# Patient Record
Sex: Female | Born: 1985 | Hispanic: No | Marital: Married | Smoking: Never smoker
Health system: Southern US, Community
[De-identification: ages and names within clinical notes are randomized; demographics above are authoritative.]

## PROBLEM LIST (undated history)

## (undated) DIAGNOSIS — C719 Malignant neoplasm of brain, unspecified: Secondary | ICD-10-CM

## (undated) DIAGNOSIS — D649 Anemia, unspecified: Secondary | ICD-10-CM

## (undated) DIAGNOSIS — C801 Malignant (primary) neoplasm, unspecified: Secondary | ICD-10-CM

## (undated) DIAGNOSIS — F419 Anxiety disorder, unspecified: Secondary | ICD-10-CM

## (undated) DIAGNOSIS — Z923 Personal history of irradiation: Secondary | ICD-10-CM

## (undated) DIAGNOSIS — Z86718 Personal history of other venous thrombosis and embolism: Secondary | ICD-10-CM

## (undated) HISTORY — PX: PORTACATH PLACEMENT: SHX2246

## (undated) HISTORY — PX: TOTAL MASTECTOMY: SHX6129

## (undated) HISTORY — PX: TONSILLECTOMY: SUR1361

## (undated) HISTORY — DX: Anxiety disorder, unspecified: F41.9

## (undated) HISTORY — PX: BREAST RECONSTRUCTION WITH PLACEMENT OF TISSUE EXPANDER AND FLEX HD (ACELLULAR HYDRATED DERMIS): SHX6295

## (undated) HISTORY — PX: WISDOM TOOTH EXTRACTION: SHX21

## (undated) HISTORY — PX: BREAST SURGERY: SHX581

## (undated) HISTORY — PX: OTHER SURGICAL HISTORY: SHX169

## (undated) HISTORY — PX: TONSILLECTOMY AND ADENOIDECTOMY: SHX28

## (undated) HISTORY — PX: REMOVAL OF BILATERAL TISSUE EXPANDERS WITH PLACEMENT OF BILATERAL BREAST IMPLANTS: SHX6431

## (undated) HISTORY — PX: RADIOACTIVE SEED GUIDED AXILLARY SENTINEL LYMPH NODE: SHX6735

## (undated) HISTORY — PX: SENTINEL NODE BIOPSY: SHX6608

## (undated) MED FILL — Fosaprepitant Dimeglumine For IV Infusion 150 MG (Base Eq): INTRAVENOUS | Qty: 5 | Status: AC

---

## 2005-04-04 ENCOUNTER — Other Ambulatory Visit: Admission: RE | Admit: 2005-04-04 | Discharge: 2005-04-04 | Payer: Self-pay | Admitting: Obstetrics and Gynecology

## 2005-04-09 ENCOUNTER — Encounter: Admission: RE | Admit: 2005-04-09 | Discharge: 2005-04-09 | Payer: Self-pay | Admitting: Obstetrics and Gynecology

## 2005-09-19 ENCOUNTER — Ambulatory Visit (HOSPITAL_BASED_OUTPATIENT_CLINIC_OR_DEPARTMENT_OTHER): Admission: RE | Admit: 2005-09-19 | Discharge: 2005-09-19 | Payer: Self-pay | Admitting: *Deleted

## 2005-09-19 ENCOUNTER — Ambulatory Visit (HOSPITAL_COMMUNITY): Admission: RE | Admit: 2005-09-19 | Discharge: 2005-09-19 | Payer: Self-pay | Admitting: *Deleted

## 2005-09-19 ENCOUNTER — Encounter (INDEPENDENT_AMBULATORY_CARE_PROVIDER_SITE_OTHER): Payer: Self-pay | Admitting: *Deleted

## 2007-09-26 ENCOUNTER — Ambulatory Visit (HOSPITAL_BASED_OUTPATIENT_CLINIC_OR_DEPARTMENT_OTHER): Admission: RE | Admit: 2007-09-26 | Discharge: 2007-09-26 | Payer: Self-pay | Admitting: Otolaryngology

## 2007-09-26 ENCOUNTER — Encounter (INDEPENDENT_AMBULATORY_CARE_PROVIDER_SITE_OTHER): Payer: Self-pay | Admitting: Otolaryngology

## 2011-02-23 NOTE — Op Note (Signed)
Jenna Cross, BARRANTES            ACCOUNT NO.:  [id number]   MEDICAL RECORD NO.:  [id number]          PATIENT TYPE:  AMB   LOCATION:  DSC                          FACILITY:  [facility]   PHYSICIAN:  Alfonse Ras, MD   DATE OF BIRTH:  11-30-1985   DATE OF PROCEDURE:  09/19/2005  DATE OF DISCHARGE:  09/19/2005                                 OPERATIVE REPORT   PREOPERATIVE DIAGNOSIS:  Right breast mass x2.   POSTOPERATIVE DIAGNOSIS:  Right breast mass x2.   OPERATION PERFORMED:  Excisional right breast biopsy x2.   SURGEON:  Danna Hefty, MD   ANESTHESIA:  General.   DESCRIPTION OF PROCEDURE:  The patient was taken to the operating room and  placed in supine position.  After adequate general anesthesia was induced,  the right breast was prepped and draped in the normal sterile fashion.  A  curvilinear incision was made  over the palpable mass in the 7 o'clock  region of the right breast.  A 2-0 Vicryl suture was placed within the  palpable mass and the mass was delivered up and through the incision,  excised in its entirety.  Adequate hemostasis was ensured and the skin was  closed with subcuticular 4-0 Monocryl.  Identical procedure was made in the  3 o'clock periareolar region through a small incision and using a grasping  suture as well.  It was closed in identical fashion.  Steri-Strips and  sterile dressings were applied.  The patient tolerated the procedure well  and went to PACU in good condition.      Alfonse Ras, MD  Electronically Signed     KRE/MEDQ  D:  10/11/2005  T:  10/11/2005  Job:  [id number]

## 2011-02-23 NOTE — Op Note (Signed)
NAMEBRIGITTE, Jenna Cross            ACCOUNT NO.:  [id number]   MEDICAL RECORD NO.:  [id number]          PATIENT TYPE:  AMB   LOCATION:  DSC                          FACILITY:  [facility]   PHYSICIAN:  Vikki Ports, MDDATE OF BIRTH:  1986-06-13   DATE OF PROCEDURE:  09/19/2005  DATE OF DISCHARGE:                                 OPERATIVE REPORT   PREOPERATIVE DIAGNOSIS:  Right breast mass x2.   POSTOPERATIVE DIAGNOSIS:  Right breast mass x2.   PROCEDURE:  Right breast biopsy x2.   SURGEON:  Vikki Ports, M.D.   ANESTHESIA:  General.   DESCRIPTION:  The patient was taken to the operating room and placed in  supine position.  After adequate general anesthesia was induced using  laryngeal mask, the right breast was prepped and draped in a normal sterile  fashion.  A curvilinear incision was made in the 7 o'clock periareolar  region of the right breast.  I dissected down through skin and subcutaneous  tissue onto a palpable mass in the right breast.  The mass was pierced with  a 3-0 Vicryl suture and the mass was delivered up and through the wound and  excised in its entirety.  The skin was closed with subcuticular 4-0 Monocryl  after adequate hemostasis was ensured.   I then turned my attention to the 3 o'clock periareolar region of the right  breast, where a palpable lesion was palpated.  Again a small incision was  made.  The mass was pierced with a 3-0 Vicryl suture, delivered up and  through the wound in a similar fashion, excised in its entirety, adequate  hemostasis was ensured, and the skin was closed with a subcuticular 4-0  Monocryl.  Dermabond dressings were placed.  The patient tolerated the  procedure well and went to PACU in good condition.      Vikki Ports, MD  Electronically Signed     KRH/MEDQ  D:  09/19/2005  T:  09/20/2005  Job:  [id number]

## 2011-02-23 NOTE — Op Note (Signed)
NAMEJACCI, RUBERG            ACCOUNT NO.:  [id number]   MEDICAL RECORD NO.:  [id number]          PATIENT TYPE:  AMB   LOCATION:  DSC                          FACILITY:  [facility]   PHYSICIAN:  Lucky Cowboy, MD         DATE OF BIRTH:  1986-02-07   DATE OF PROCEDURE:  10/23/2007  DATE OF DISCHARGE:  09/26/2007                               OPERATIVE REPORT   PREOPERATIVE DIAGNOSIS:  Chronic tonsillitis.   POSTOPERATIVE DIAGNOSIS:  Chronic tonsillitis.   PROCEDURE:  Tonsillectomy.   SURGEON:  Dr. Lucky Cowboy.   ANESTHESIA:  General.   ESTIMATED BLOOD LOSS:  Less than 20 mL.   SPECIMENS:  Tonsils.   COMPLICATIONS:  None.   INDICATIONS:  The patient is a 25 year old female who has had many  problems with tonsillitis.  These have been treated with antibiotic  therapy.  She continues to have frequent sore throats.  For these  reasons, the tonsils are removed.   PROCEDURE:  The patient was taken to the operating room and placed on  the table in the supine position.  She was then placed under general  endotracheal anesthesia and the table rotated counterclockwise 90  degrees.  The head and body were draped.  A Crowe-Davis mouth gag with a  #3 tongue blade was then placed intraorally, opened and suspended on the  Mayo stand.  Palpation of the soft palate was without evidence of  submucosal cleft.  The right palatine tonsil was grasped with Allis  clamps directed inferior medially.  Bovie cautery was then used to  excise the tonsil staying within the peritonsillar space adjacent to the  tonsillar capsule.  The left palatine tonsil was removed in identical  fashion.  The oral cavity was irrigated and then suctioned free of  saline.  An NG tube was placed down the esophagus for suctioning of the  gastric contents.  The mouth gag was removed noting no damage to the  teeth or soft tissues.  Table was rotated clockwise 90 degrees to its  original position.  The patient was awakened from  anesthesia and taken  to the Post Anesthesia Care in stable condition.  No complications.      Lucky Cowboy, MD  Electronically Signed     SJ/MEDQ  D:  10/23/2007  T:  10/24/2007  Job:  [id number]

## 2011-02-23 NOTE — Op Note (Signed)
NAMEYULIA, Jenna Cross            ACCOUNT NO.:  [id number]   MEDICAL RECORD NO.:  [id number]          PATIENT TYPE:  AMB   LOCATION:  DSC                          FACILITY:  [facility]   PHYSICIAN:  Vikki Ports, MDDATE OF BIRTH:  12-26-1985   DATE OF PROCEDURE:  09/19/2005  DATE OF DISCHARGE:                                 OPERATIVE REPORT   PREOPERATIVE DIAGNOSIS:  Right breast mass x2.   POSTOPERATIVE DIAGNOSIS:  Right breast mass x2.   PROCEDURE:  Right breast biopsy x2.   SURGEON:  Vikki Ports, M.D.   ANESTHESIA:  General anesthesia.   DESCRIPTION OF PROCEDURE:  Patient was taken to the operating room and  placed in supine position.  After adequate general anesthesia was induced,  the right breast was prepped and draped in the normal sterile fashion.  Skin  overlying the 9 o'clock region of the peri-areolar right breast was incised,  dissected down through skin and subcutaneous tissue and to a palpable right  breast mass.  This was speared with a 3-0 Vicryl suture.  The mass was  delivered up and through the wound.  It was excised in its entirety.  Adequate hemostasis was assured and the skin was closed with subcuticular 4-  0 Monocryl.   I then turned my attention to the ...   Dictation ended at this point.      Vikki Ports, MD  Electronically Signed     KRH/MEDQ  D:  09/19/2005  T:  09/20/2005  Job:  [id number]

## 2012-10-16 ENCOUNTER — Other Ambulatory Visit: Payer: Self-pay | Admitting: Obstetrics and Gynecology

## 2013-03-23 ENCOUNTER — Other Ambulatory Visit: Payer: Self-pay | Admitting: *Deleted

## 2013-03-23 DIAGNOSIS — R600 Localized edema: Secondary | ICD-10-CM

## 2013-03-24 ENCOUNTER — Encounter (INDEPENDENT_AMBULATORY_CARE_PROVIDER_SITE_OTHER): Payer: 59 | Admitting: *Deleted

## 2013-03-24 DIAGNOSIS — R609 Edema, unspecified: Secondary | ICD-10-CM

## 2014-02-16 ENCOUNTER — Other Ambulatory Visit: Payer: Self-pay | Admitting: Obstetrics and Gynecology

## 2015-06-23 ENCOUNTER — Other Ambulatory Visit: Payer: Self-pay | Admitting: Obstetrics and Gynecology

## 2015-06-24 LAB — CYTOLOGY - PAP

## 2016-03-27 ENCOUNTER — Telehealth: Payer: Self-pay | Admitting: Neurology

## 2016-03-27 NOTE — Telephone Encounter (Signed)
Returned pt TC. No answer, left VM mssg w/ new pt appt availability for next Thursday 6/29. May call back to schedule NP appt for persistent leg numbness/weakness.

## 2016-03-27 NOTE — Telephone Encounter (Signed)
Pt returned Jennifer's call. Anderson Malta was skyped regarding setting up NP appt to get pt in sooner. She said there was an opening for Dr Jannifer Franklin on Friday 6./23, pt will be out of town that day and could not take the appt . sts she will be flexible next week. Please call

## 2016-04-05 ENCOUNTER — Encounter: Payer: Self-pay | Admitting: Neurology

## 2016-04-05 ENCOUNTER — Ambulatory Visit (INDEPENDENT_AMBULATORY_CARE_PROVIDER_SITE_OTHER): Payer: BLUE CROSS/BLUE SHIELD | Admitting: Neurology

## 2016-04-05 VITALS — BP 102/66 | HR 72 | Ht 63.0 in | Wt 119.5 lb

## 2016-04-05 DIAGNOSIS — R202 Paresthesia of skin: Secondary | ICD-10-CM | POA: Insufficient documentation

## 2016-04-05 NOTE — Progress Notes (Signed)
Reason for visit: Leg numbness  Referring physician: Dr. Bettye Boeck is a 30 y.o. female  History of present illness:  Ms. Jenna Cross is a 30 year old right-handed white female with a history of onset of right anterolateral leg numbness that began around Memorial Day of 2017. The area of numbness is mainly in a 10 cm area just below the knee, but may spread down lower on the leg at times. The patient has felt somewhat weak in the leg with some stiffness sensation as well. This has not significantly altered her ability to ambulate, she has not had any gait instability or falls. Within several weeks after the onset of the leg symptoms, she also began having some sensation of heaviness or weakness of the right arm unassociated with numbness. She denies any pain in the neck, she does have a history of some low back pain without radiation down the leg. The patient denies any headaches, numbness on the head or neck, she denies vision changes, speech changes or swallowing problems. She denies any issues controlling the bowels or the bladder. She has had some improvement of the symptoms as time has gone on, there has been some mild fatigue. She is sent to this office for further evaluation.  Past Medical History  Diagnosis Date  . Anxiety     History reviewed. No pertinent past surgical history.  Family History  Problem Relation Age of Onset  . Migraines Mother     Social history:  reports that she has never smoked. She does not have any smokeless tobacco history on file. She reports that she drinks about 1.2 oz of alcohol per week. She reports that she does not use illicit drugs.  Medications:  Prior to Admission medications   Medication Sig Start Date End Date Taking? Authorizing Provider  [place] 3-0.02 MG tablet  03/22/16  Yes Historical Provider, MD  spironolactone (ALDACTONE) 50 MG tablet Take 50 mg by mouth daily. 03/07/16  Yes Historical Provider, MD  ZENPEP 40000 units CPEP  Reported on 04/05/2016 03/29/16   Historical Provider, MD     Not on File  ROS:  Out of a complete 14 system review of symptoms, the patient complains only of the following symptoms, and all other reviewed systems are negative.  Anxiety  Blood pressure 102/66, pulse 72, height 5\' 3"  (1.6 m), weight 119 lb 8 oz (54.205 kg).  Physical Exam  General: The patient is alert and cooperative at the time of the examination.  Eyes: Pupils are equal, round, and reactive to light. Discs are flat bilaterally.  Neck: The neck is supple, no carotid bruits are noted.  Respiratory: The respiratory examination is clear.  Cardiovascular: The cardiovascular examination reveals a regular rate and rhythm, no obvious murmurs or rubs are noted.  Neuromuscular: Range of movement of the low back is full. The patient is able to walk on heels and the toes.  Skin: Extremities are without significant edema.  Neurologic Exam  Mental status: The patient is alert and oriented x 3 at the time of the examination. The patient has apparent normal recent and remote memory, with an apparently normal attention span and concentration ability.  Cranial nerves: Facial symmetry is present. There is good sensation of the face to pinprick and soft touch bilaterally. The strength of the facial muscles and the muscles to head turning and shoulder shrug are normal bilaterally. Speech is well enunciated, no aphasia or dysarthria is noted. Extraocular movements are full. Visual fields are  full. The tongue is midline, and the patient has symmetric elevation of the soft palate. No obvious hearing deficits are noted.  Motor: The motor testing reveals 5 over 5 strength of all 4 extremities. Good symmetric motor tone is noted throughout.  Sensory: Sensory testing is intact to pinprick, soft touch, vibration sensation, and position sense on all 4 extremities. No evidence of extinction is noted.  Coordination: Cerebellar testing reveals  good finger-nose-finger and heel-to-shin bilaterally.  Gait and station: Gait is normal. Tandem gait is normal. Romberg is negative. No drift is seen.  Reflexes: Deep tendon reflexes are symmetric and normal bilaterally. Toes are downgoing bilaterally.   Assessment/Plan:  1. Right leg numbness, right arm heaviness  The patient reports symptoms on the right arm and right leg that are mainly subjective, clinical examination today is normal. Given the age of the patient, we will need to evaluate her for demyelinating disease. MRI of the brain will be ordered. Some blood work will be done today. If this study is unremarkable, nerve conduction study of the right leg may be of some benefit. I will call the patient with the results of the MRI study.  Jill Alexanders MD 04/05/2016 7:52 PM  [facility] [address] [place], [place] [address]  Phone [phone] Fax [phone]

## 2016-04-06 LAB — COMPREHENSIVE METABOLIC PANEL
A/G RATIO: 1.6 (ref 1.2–2.2)
ALK PHOS: 68 IU/L (ref 39–117)
ALT: 10 IU/L (ref 0–32)
AST: 16 IU/L (ref 0–40)
Albumin: 4.4 g/dL (ref 3.5–5.5)
BILIRUBIN TOTAL: 0.5 mg/dL (ref 0.0–1.2)
BUN/Creatinine Ratio: 14 (ref 9–23)
BUN: 12 mg/dL (ref 6–20)
CHLORIDE: 100 mmol/L (ref 96–106)
CO2: 19 mmol/L (ref 18–29)
Calcium: 9.3 mg/dL (ref 8.7–10.2)
Creatinine, Ser: 0.83 mg/dL (ref 0.57–1.00)
GFR calc Af Amer: 109 mL/min/{1.73_m2} (ref >59)
GFR calc non Af Amer: 95 mL/min/{1.73_m2} (ref >59)
GLOBULIN, TOTAL: 2.8 g/dL (ref 1.5–4.5)
Glucose: 83 mg/dL (ref 65–99)
POTASSIUM: 4.5 mmol/L (ref 3.5–5.2)
SODIUM: 140 mmol/L (ref 134–144)
Total Protein: 7.2 g/dL (ref 6.0–8.5)

## 2016-04-06 LAB — B. BURGDORFI ANTIBODIES: Lyme IgG/IgM Ab: <0.91 {ISR} (ref 0.00–0.90)

## 2016-04-06 LAB — ANGIOTENSIN CONVERTING ENZYME: Angio Convert Enzyme: 39 U/L (ref 14–82)

## 2016-04-06 LAB — ANA W/REFLEX: Anti Nuclear Antibody(ANA): NEGATIVE

## 2016-04-06 LAB — HIV ANTIBODY (ROUTINE TESTING W REFLEX): HIV Screen 4th Generation wRfx: NONREACTIVE

## 2016-04-06 LAB — SEDIMENTATION RATE: SED RATE: 2 mm/h (ref 0–32)

## 2016-04-06 LAB — VITAMIN B12: VITAMIN B 12: 278 pg/mL (ref 211–946)

## 2016-04-09 ENCOUNTER — Telehealth: Payer: Self-pay

## 2016-04-09 NOTE — Telephone Encounter (Signed)
-----   Message from Kathrynn Ducking, MD sent at 04/08/2016  2:17 PM EDT -----  The blood work results are unremarkable. Please call the patient.  ----- Message -----    From: [facility] Lab Results In Interface    Sent: 04/06/2016   7:43 AM      To: Kathrynn Ducking, MD

## 2016-04-09 NOTE — Telephone Encounter (Signed)
Called pt w/ unremarkable lab results. May call back w/ further questions/concerns.

## 2016-04-26 ENCOUNTER — Ambulatory Visit
Admission: RE | Admit: 2016-04-26 | Discharge: 2016-04-26 | Disposition: A | Discharge status: Home / Self Care | Payer: BLUE CROSS/BLUE SHIELD | Source: Ambulatory Visit | Attending: Neurology | Admitting: Neurology

## 2016-04-26 DIAGNOSIS — R202 Paresthesia of skin: Secondary | ICD-10-CM

## 2016-04-26 MED ORDER — GADOBENATE DIMEGLUMINE 529 MG/ML IV SOLN
10.0000 mL | Freq: Once | INTRAVENOUS | Status: AC | PRN
  Administered 2016-04-26: 10 mL via INTRAVENOUS

## 2016-04-27 ENCOUNTER — Telehealth: Payer: Self-pay | Admitting: Neurology

## 2016-04-27 NOTE — Telephone Encounter (Signed)
I called the patient. The MRI of the brain is normal. If the sensory symptoms are getting worse, we will check EMG and NCV study. She is to call if this is the case.   MRI brain 04/27/16:  IMPRESSION: Unremarkable MRI scan of the brain with and without contrast

## 2018-08-29 ENCOUNTER — Encounter: Payer: Self-pay | Admitting: Plastic Surgery

## 2018-08-29 ENCOUNTER — Ambulatory Visit (INDEPENDENT_AMBULATORY_CARE_PROVIDER_SITE_OTHER): Payer: Self-pay | Admitting: Plastic Surgery

## 2018-08-29 DIAGNOSIS — Z719 Counseling, unspecified: Secondary | ICD-10-CM | POA: Insufficient documentation

## 2018-08-29 NOTE — Progress Notes (Signed)
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 I discussed with the patient this proposed procedure of botulinum toxin injections, which is customized depending on the particular needs of the patient. It is performed on facial rhytids as a temporary correction. The alternatives were discussed with the patient. The risks were addressed including bleeding, scarring, infection, damage to deeper structures, asymmetry, and chronic pain, which may occur infrequently after a procedure. The individual's choice to undergo a surgical procedure is based on the comparison of risks to potential benefits. Other risks include unsatisfactory results, brow ptosis, eyelid ptosis, allergic reaction, temporary paralysis, which should go away with time, bruising, blurring disturbances and delayed healing. Botulinum toxin injections do not arrest the aging process or produce permanent tightening of the eyelid.  Operative intervention maybe necessary to maintain the results of a blepharoplasty or botulinum toxin. The patient understands and wishes to proceed. An informed consent was signed and informational brochures given to her prior to the procedure.  Procedure: The area was prepped with alcohol and dried with a clean gauze. Using a clean technique, the botulinum toxin was diluted with 1.25 cc of preservative-free normal saline which was slowly injected with an 18 gauge needle in a tuberculin syringes.  A 32 gauge needles were then used to inject the botulinum toxin. This mixture allow for an aliquot of 5 units per 0.1 cc in each injection site.    Subsequently the mixture was injected in the glabellar and forehead area with preservation of the temporal  branch to the lateral eyebrow as well as into each lateral canthal area beginning from the lateral orbital rim medial to the zygomaticus major in 3 separate areas. A total of 20 Units of botulinum toxin was used. The forehead and glabellar area was injected with care to inject intramuscular only while holding pressure on the supratrochlear vessels in each area during each injection on either side of the medial corrugators. The injection proceeded vertically superiorly to the medial 2/3 of the frontalis muscle and superior 2/3 of the lateral frontalis, again with preservation of the frontal branch.  The midface area was injected at the 3 sub-regions of the mid-face: zygomaticomalar region, anteromedial cheek region, and submalar region for a total of one syringe on each side of the face. The technique used was serial puncture with equal injections in the 3 sub-regions: the zygomaticomalar region, the anteromedial cheek, and the submalar region.  No complications were noted. Light pressure was held for 5 minutes. She was instructed explicitly in post-operative care.  Botox LOT:  [id number] C2 EXP:  4/22  J. Voluma XC two LOT: [id number] EXP: 2019-10-27

## 2019-06-08 LAB — OB RESULTS CONSOLE ABO/RH
RH Type: POSITIVE
RH Type: POSITIVE

## 2019-06-08 LAB — OB RESULTS CONSOLE ANTIBODY SCREEN
Antibody Screen: NEGATIVE
Antibody Screen: NEGATIVE

## 2019-06-08 LAB — OB RESULTS CONSOLE GC/CHLAMYDIA
Chlamydia: NEGATIVE
Gonorrhea: NEGATIVE

## 2019-06-08 LAB — OB RESULTS CONSOLE RUBELLA ANTIBODY, IGM
Rubella: NON-IMMUNE/NOT IMMUNE
Rubella: NON-IMMUNE/NOT IMMUNE

## 2019-06-08 LAB — OB RESULTS CONSOLE RPR
RPR: NONREACTIVE
RPR: NONREACTIVE

## 2019-06-08 LAB — OB RESULTS CONSOLE HIV ANTIBODY (ROUTINE TESTING)
HIV: NONREACTIVE
HIV: NONREACTIVE

## 2019-06-08 LAB — OB RESULTS CONSOLE HEPATITIS B SURFACE ANTIGEN
Hepatitis B Surface Ag: NEGATIVE
Hepatitis B Surface Ag: NEGATIVE

## 2019-06-24 LAB — OB RESULTS CONSOLE GC/CHLAMYDIA
Chlamydia: NEGATIVE
Gonorrhea: NEGATIVE

## 2019-10-09 NOTE — L&D Delivery Note (Signed)
Operative Delivery Note At 3:09 AM a viable female was delivered via Vaginal, Spontaneous.  Presentation: vertex; Position: Right,, Occiput,, Posterior; Station: +2.  Verbal consent: obtained from patient.  Risks and benefits discussed in detail.  Risks include, but are not limited to the risks of anesthesia, bleeding, infection, damage to maternal tissues, fetal cephalhematoma.  There is also the risk of inability to effect vaginal delivery of the head, or shoulder dystocia that cannot be resolved by established maneuvers, leading to the need for emergency cesarean section.  APGAR: 7, 9; weight  pending.   Placenta status: spontaneously with 3 vessel cord , .   Cord:  with the following complications:none .  Cord pH: not performed  Anesthesia:  epidural Instruments: mushroom delivered easily with 2 pulls  Episiotomy: None Lacerations: 3rd degree Suture Repair: 2.0 3.0 chromic vicryl  Sphincter reapproximated with 2.0 vicryl  Est. Blood Loss (mL):  300  Mom to postpartum.  Baby to Couplet care / Skin to Skin.  Jenna Cross 01/04/2020, 3:31 AM

## 2019-12-23 LAB — OB RESULTS CONSOLE GBS
GBS: NEGATIVE
GBS: NEGATIVE

## 2019-12-30 ENCOUNTER — Telehealth (HOSPITAL_COMMUNITY): Payer: Self-pay | Admitting: *Deleted

## 2019-12-30 ENCOUNTER — Encounter (HOSPITAL_COMMUNITY): Payer: Self-pay | Admitting: *Deleted

## 2019-12-30 NOTE — Telephone Encounter (Signed)
Preadmission screen  

## 2020-01-03 ENCOUNTER — Inpatient Hospital Stay (HOSPITAL_COMMUNITY)
Admission: AD | Admit: 2020-01-03 | Payer: BC Managed Care – PPO | Source: Home / Self Care | Admitting: Obstetrics and Gynecology

## 2020-01-03 ENCOUNTER — Inpatient Hospital Stay (HOSPITAL_COMMUNITY): Payer: BC Managed Care – PPO | Admitting: Anesthesiology

## 2020-01-03 ENCOUNTER — Encounter (HOSPITAL_COMMUNITY): Payer: Self-pay | Admitting: Obstetrics and Gynecology

## 2020-01-03 ENCOUNTER — Inpatient Hospital Stay (HOSPITAL_COMMUNITY)
Admission: AD | Admit: 2020-01-03 | Discharge: 2020-01-06 | DRG: 768 | Disposition: A | Discharge status: Home / Self Care | Payer: BC Managed Care – PPO | Attending: Obstetrics and Gynecology | Admitting: Obstetrics and Gynecology

## 2020-01-03 ENCOUNTER — Other Ambulatory Visit: Payer: Self-pay

## 2020-01-03 DIAGNOSIS — Z3490 Encounter for supervision of normal pregnancy, unspecified, unspecified trimester: Secondary | ICD-10-CM

## 2020-01-03 DIAGNOSIS — D62 Acute posthemorrhagic anemia: Secondary | ICD-10-CM | POA: Diagnosis not present

## 2020-01-03 DIAGNOSIS — O9081 Anemia of the puerperium: Secondary | ICD-10-CM | POA: Diagnosis not present

## 2020-01-03 DIAGNOSIS — O4292 Full-term premature rupture of membranes, unspecified as to length of time between rupture and onset of labor: Principal | ICD-10-CM | POA: Diagnosis present

## 2020-01-03 DIAGNOSIS — Z87891 Personal history of nicotine dependence: Secondary | ICD-10-CM | POA: Diagnosis not present

## 2020-01-03 DIAGNOSIS — Z3A37 37 weeks gestation of pregnancy: Secondary | ICD-10-CM

## 2020-01-03 DIAGNOSIS — Z20822 Contact with and (suspected) exposure to covid-19: Secondary | ICD-10-CM | POA: Diagnosis present

## 2020-01-03 LAB — CBC
HCT: 36.6 % (ref 36.0–46.0)
Hemoglobin: 12.1 g/dL (ref 12.0–15.0)
MCH: 31.8 pg (ref 26.0–34.0)
MCHC: 33.1 g/dL (ref 30.0–36.0)
MCV: 96.3 fL (ref 80.0–100.0)
Platelets: 237 10*3/uL (ref 150–400)
RBC: 3.8 MIL/uL — ABNORMAL LOW (ref 3.87–5.11)
RDW: 13.2 % (ref 11.5–15.5)
WBC: 11.4 10*3/uL — ABNORMAL HIGH (ref 4.0–10.5)
nRBC: 0 % (ref 0.0–0.2)

## 2020-01-03 LAB — SARS CORONAVIRUS 2 (TAT 6-24 HRS): SARS Coronavirus 2: NEGATIVE

## 2020-01-03 LAB — WET PREP, GENITAL
Clue Cells Wet Prep HPF POC: NONE SEEN
Sperm: NONE SEEN
Trich, Wet Prep: NONE SEEN
Yeast Wet Prep HPF POC: NONE SEEN

## 2020-01-03 LAB — AMNISURE RUPTURE OF MEMBRANE (ROM) NOT AT ARMC: Amnisure ROM: POSITIVE

## 2020-01-03 LAB — TYPE AND SCREEN
ABO/RH(D): O POS
Antibody Screen: NEGATIVE

## 2020-01-03 LAB — POCT FERN TEST: POCT Fern Test: POSITIVE

## 2020-01-03 LAB — ABO/RH: ABO/RH(D): O POS

## 2020-01-03 MED ORDER — BUPIVACAINE HCL (PF) 0.25 % IJ SOLN
INTRAMUSCULAR | Status: AC
  Filled 2020-01-03: qty 10

## 2020-01-03 MED ORDER — SODIUM CHLORIDE (PF) 0.9 % IJ SOLN
INTRAMUSCULAR | Status: DC | PRN
  Administered 2020-01-03: 12 mL/h via EPIDURAL

## 2020-01-03 MED ORDER — FENTANYL-BUPIVACAINE-NACL 0.5-0.125-0.9 MG/250ML-% EP SOLN
12.0000 mL/h | EPIDURAL | Status: DC | PRN
  Filled 2020-01-03: qty 250

## 2020-01-03 MED ORDER — DIPHENHYDRAMINE HCL 50 MG/ML IJ SOLN: 12.5000 mg | INTRAMUSCULAR | Status: DC | PRN

## 2020-01-03 MED ORDER — LACTATED RINGERS IV SOLN: INTRAVENOUS | Status: DC

## 2020-01-03 MED ORDER — ONDANSETRON HCL 4 MG/2ML IJ SOLN: 4.0000 mg | Freq: Four times a day (QID) | INTRAMUSCULAR | Status: DC | PRN

## 2020-01-03 MED ORDER — CALCIUM CARBONATE ANTACID 500 MG PO CHEW: 1.0000 | CHEWABLE_TABLET | ORAL | Status: DC | PRN

## 2020-01-03 MED ORDER — PANTOPRAZOLE SODIUM 20 MG PO TBEC
20.0000 mg | DELAYED_RELEASE_TABLET | Freq: Every day | ORAL | Status: DC
  Administered 2020-01-03: 20 mg via ORAL
  Filled 2020-01-03: qty 1

## 2020-01-03 MED ORDER — LACTATED RINGERS IV SOLN
500.0000 mL | Freq: Once | INTRAVENOUS | Status: AC
  Administered 2020-01-03: 1000 mL via INTRAVENOUS

## 2020-01-03 MED ORDER — FLEET ENEMA 7-19 GM/118ML RE ENEM: 1.0000 | ENEMA | Freq: Once | RECTAL | Status: DC

## 2020-01-03 MED ORDER — PHENYLEPHRINE 40 MCG/ML (10ML) SYRINGE FOR IV PUSH (FOR BLOOD PRESSURE SUPPORT)
80.0000 ug | PREFILLED_SYRINGE | INTRAVENOUS | Status: DC | PRN
  Filled 2020-01-03: qty 10

## 2020-01-03 MED ORDER — SODIUM CHLORIDE 0.9 % IV SOLN
2.0000 g | Freq: Four times a day (QID) | INTRAVENOUS | Status: DC
  Administered 2020-01-04: 2 g via INTRAVENOUS
  Filled 2020-01-03 (×2): qty 2000

## 2020-01-03 MED ORDER — SOD CITRATE-CITRIC ACID 500-334 MG/5ML PO SOLN: 30.0000 mL | ORAL | Status: DC | PRN

## 2020-01-03 MED ORDER — TERBUTALINE SULFATE 1 MG/ML IJ SOLN: 0.2500 mg | Freq: Once | INTRAMUSCULAR | Status: DC | PRN

## 2020-01-03 MED ORDER — EPHEDRINE 5 MG/ML INJ: 10.0000 mg | INTRAVENOUS | Status: DC | PRN

## 2020-01-03 MED ORDER — SODIUM BICARBONATE 8.4 % IV SOLN
INTRAVENOUS | Status: DC | PRN
  Administered 2020-01-03: 3 mL via EPIDURAL

## 2020-01-03 MED ORDER — LIDOCAINE HCL (PF) 1 % IJ SOLN: 30.0000 mL | INTRAMUSCULAR | Status: DC | PRN

## 2020-01-03 MED ORDER — LORATADINE 10 MG PO TABS
10.0000 mg | ORAL_TABLET | Freq: Every day | ORAL | Status: DC
  Filled 2020-01-03: qty 1

## 2020-01-03 MED ORDER — OXYTOCIN BOLUS FROM INFUSION
500.0000 mL | Freq: Once | INTRAVENOUS | Status: AC
  Administered 2020-01-04: 500 mL via INTRAVENOUS

## 2020-01-03 MED ORDER — LACTATED RINGERS IV SOLN: 500.0000 mL | INTRAVENOUS | Status: DC | PRN

## 2020-01-03 MED ORDER — OXYCODONE-ACETAMINOPHEN 5-325 MG PO TABS: 1.0000 | ORAL_TABLET | ORAL | Status: DC | PRN

## 2020-01-03 MED ORDER — LORATADINE 10 MG PO TABS
10.0000 mg | ORAL_TABLET | Freq: Every day | ORAL | Status: DC
  Administered 2020-01-03: 10 mg via ORAL
  Filled 2020-01-03: qty 1

## 2020-01-03 MED ORDER — OXYTOCIN 40 UNITS IN NORMAL SALINE INFUSION - SIMPLE MED
1.0000 m[IU]/min | INTRAVENOUS | Status: DC
  Administered 2020-01-03: 4 m[IU]/min via INTRAVENOUS
  Filled 2020-01-03: qty 1000

## 2020-01-03 MED ORDER — VALACYCLOVIR HCL 500 MG PO TABS
500.0000 mg | ORAL_TABLET | Freq: Every day | ORAL | Status: DC
  Administered 2020-01-03: 500 mg via ORAL
  Filled 2020-01-03: qty 1

## 2020-01-03 MED ORDER — ACETAMINOPHEN 325 MG PO TABS: 650.0000 mg | ORAL_TABLET | ORAL | Status: DC | PRN

## 2020-01-03 MED ORDER — BUPIVACAINE HCL (PF) 0.25 % IJ SOLN
INTRAMUSCULAR | Status: DC | PRN
  Administered 2020-01-03 (×2): 4 mL via EPIDURAL

## 2020-01-03 MED ORDER — OXYTOCIN 40 UNITS IN NORMAL SALINE INFUSION - SIMPLE MED: 2.5000 [IU]/h | INTRAVENOUS | Status: DC

## 2020-01-03 MED ORDER — OXYCODONE-ACETAMINOPHEN 5-325 MG PO TABS: 2.0000 | ORAL_TABLET | ORAL | Status: DC | PRN

## 2020-01-03 MED ORDER — PHENYLEPHRINE 40 MCG/ML (10ML) SYRINGE FOR IV PUSH (FOR BLOOD PRESSURE SUPPORT): 80.0000 ug | PREFILLED_SYRINGE | INTRAVENOUS | Status: DC | PRN

## 2020-01-03 NOTE — H&P (Signed)
Jenna Cross is a 34 y.o. G 1 P 0 at 23 w 6 days presents with SROM ?since Friday. - slow trickle and some contractions PNC uncomplicated Confirmed ROM in triage  OB History    Gravida  1   Para      Term      Preterm      AB      Living        SAB      TAB      Ectopic      Multiple      Live Births             Past Medical History:  Diagnosis Date  . Anxiety    no medication   Past Surgical History:  Procedure Laterality Date  . BREAST SURGERY     cyst removal from right breast   . TONSILLECTOMY AND ADENOIDECTOMY    . WISDOM TOOTH EXTRACTION     Family History: family history is not on file. Social History:  reports that she has quit smoking. She has never used smokeless tobacco. She reports previous alcohol use. She reports that she does not use drugs.     Maternal Diabetes: No Genetic Screening: Normal Maternal Ultrasounds/Referrals: Normal Fetal Ultrasounds or other Referrals:  None Maternal Substance Abuse:  No Significant Maternal Medications:  None Significant Maternal Lab Results:  None Other Comments:  None  Review of Systems  All other systems reviewed and are negative.  Maternal Medical History:  Reason for admission: Rupture of membranes.   Fetal activity: Perceived fetal activity is normal.    Prenatal complications: no prenatal complications   Dilation: 1 Effacement (%): 80 Station: -2 Exam by:: jaton burgess rnc Blood pressure 119/66, pulse 97, temperature 98.5 F (36.9 C), temperature source Oral, resp. rate 18, SpO2 98 %. Maternal Exam:  Uterine Assessment: Contraction strength is mild.  Contraction frequency is irregular.   Abdomen: Fetal presentation: vertex     Fetal Exam Fetal State Assessment: Category I - tracings are normal.     Physical Exam  Nursing note and vitals reviewed. Constitutional: She appears well-developed and well-nourished.  HENT:  Head: Normocephalic and atraumatic.  Eyes: Pupils  are equal, round, and reactive to light.  Cardiovascular: Normal rate and regular rhythm.  Respiratory: Effort normal.  GI: Soft.  Musculoskeletal:     Cervical back: Normal range of motion.    Prenatal labs: ABO, Rh: --/--/O POS (03/28 1629) Antibody: NEG (03/28 1629) Rubella: Nonimmune (08/31 0000) RPR: Nonreactive (08/31 0000)  HBsAg: Negative (08/31 0000)  HIV: Non-reactive (08/31 0000)  GBS: Negative/-- (03/17 0000)   Assessment/Plan: IUP at Term Prolonged SROM Admit Ampicillin  Epidural and Pitocin per protocol   Cyril Mourning 01/03/2020, 5:24 PM

## 2020-01-03 NOTE — MAU Note (Signed)
covid swab to lab.

## 2020-01-03 NOTE — MAU Provider Note (Signed)
S: Ms. Jenna Cross is a 34 y.o. G1P0 at [redacted]w[redacted]d  who presents to MAU today complaining of leaking of fluid since yesterday morning. She denies vaginal bleeding. She denies contractions. She reports normal fetal movement.    O: BP 119/66 (BP Location: Right Arm)   Pulse 97   Temp 98.5 F (36.9 C) (Oral)   Resp 18   SpO2 98%  GENERAL: Well-developed, well-nourished female in no acute distress.  HEAD: Normocephalic, atraumatic.  CHEST: Normal effort of breathing, regular heart rate ABDOMEN: Soft, nontender, gravid PELVIC: Normal external female genitalia. Vagina is pink and rugated. Cervix with normal contour, no lesions. Normal discharge.  Questionable  pooling. Light pink bloody discharge in the vagina  -Cervical exam: 1/80 (per RN)   -Maryann Alar positive - Amnisure pending  Fetal Monitoring: EFM Baseline: 125 Variability: mod Accelerations: present Decelerations: negative Contractions: irregular, uterine irritability.   Results for orders placed or performed during the hospital encounter of 01/03/20 (from the past 24 hour(s))  Wet prep, genital     Status: Abnormal   Collection Time: 01/03/20  3:04 PM  Result Value Ref Range   Yeast Wet Prep HPF POC NONE SEEN NONE SEEN   Trich, Wet Prep NONE SEEN NONE SEEN   Clue Cells Wet Prep HPF POC NONE SEEN NONE SEEN   WBC, Wet Prep HPF POC FEW (A) NONE SEEN   Sperm NONE SEEN      A: SIUP at [redacted]w[redacted]d  SROM  P: Report given to RN to contact MD on call for further instructions  Starr Lake, CNM 01/03/2020 3:37 PM   Mervyn Skeeters Tyus Kallam 01/03/2020, 3:31 PM

## 2020-01-03 NOTE — MAU Note (Signed)
Pt states she noticed LOF throughout weekend; describes it as watery & clear continuous leaking.  Denies VB.  +FM.  Denies UCs/cramping.

## 2020-01-03 NOTE — Anesthesia Procedure Notes (Addendum)
Epidural Patient location during procedure: floor Start time: 01/03/2020 6:15 PM End time: 01/03/2020 6:36 PM  Staffing Anesthesiologist: Oleta Mouse, MD Performed: anesthesiologist   Preanesthetic Checklist Completed: patient identified, IV checked, risks and benefits discussed, surgical consent, monitors and equipment checked, pre-op evaluation and timeout performed  Epidural Patient position: sitting Prep: DuraPrep Patient monitoring: heart rate, continuous pulse ox and blood pressure Approach: midline Location: L3-L4 Injection technique: LOR saline  Needle:  Needle type: Tuohy  Needle gauge: 17 G Needle length: 9 cm Needle insertion depth: 8 cm Catheter type: closed end flexible Catheter size: 19 Gauge Catheter at skin depth: 14 cm Test dose: negative and 2% lidocaine with Epi 1:200 K  Additional Notes Reason for block:at surgeon's request and procedure for pain

## 2020-01-03 NOTE — Anesthesia Preprocedure Evaluation (Signed)
Anesthesia Evaluation  Patient identified by MRN, date of birth, ID band Patient awake    Reviewed: Allergy & Precautions, Patient's Chart, lab work & pertinent test results  History of Anesthesia Complications Negative for: history of anesthetic complications  Airway Mallampati: II  TM Distance: >3 FB Neck ROM: Full    Dental  (+) Dental Advisory Given   Pulmonary former smoker,  covid pending   breath sounds clear to auscultation       Cardiovascular negative cardio ROS   Rhythm:Regular     Neuro/Psych PSYCHIATRIC DISORDERS Anxiety negative neurological ROS     GI/Hepatic negative GI ROS, Neg liver ROS,   Endo/Other  negative endocrine ROS  Renal/GU negative Renal ROS     Musculoskeletal   Abdominal   Peds  Hematology negative hematology ROS (+) plt 237   Anesthesia Other Findings   Reproductive/Obstetrics (+) Pregnancy                             Anesthesia Physical Anesthesia Plan  ASA: II  Anesthesia Plan: Epidural   Post-op Pain Management:    Induction:   PONV Risk Score and Plan: 2 and Treatment may vary due to age or medical condition  Airway Management Planned:   Additional Equipment: Fetal Monitoring  Intra-op Plan:   Post-operative Plan:   Informed Consent: I have reviewed the patients History and Physical, chart, labs and discussed the procedure including the risks, benefits and alternatives for the proposed anesthesia with the patient or authorized representative who has indicated his/her understanding and acceptance.       Plan Discussed with:   Anesthesia Plan Comments:         Anesthesia Quick Evaluation

## 2020-01-04 ENCOUNTER — Encounter (HOSPITAL_COMMUNITY): Payer: Self-pay | Admitting: Obstetrics and Gynecology

## 2020-01-04 ENCOUNTER — Telehealth (HOSPITAL_COMMUNITY): Payer: Self-pay | Admitting: *Deleted

## 2020-01-04 LAB — CBC
HCT: 30.7 % — ABNORMAL LOW (ref 36.0–46.0)
Hemoglobin: 10.2 g/dL — ABNORMAL LOW (ref 12.0–15.0)
MCH: 32 pg (ref 26.0–34.0)
MCHC: 33.2 g/dL (ref 30.0–36.0)
MCV: 96.2 fL (ref 80.0–100.0)
Platelets: 228 10*3/uL (ref 150–400)
RBC: 3.19 MIL/uL — ABNORMAL LOW (ref 3.87–5.11)
RDW: 13 % (ref 11.5–15.5)
WBC: 21.2 10*3/uL — ABNORMAL HIGH (ref 4.0–10.5)
nRBC: 0 % (ref 0.0–0.2)

## 2020-01-04 LAB — RPR: RPR Ser Ql: NONREACTIVE

## 2020-01-04 MED ORDER — TETANUS-DIPHTH-ACELL PERTUSSIS 5-2.5-18.5 LF-MCG/0.5 IM SUSP: 0.5000 mL | Freq: Once | INTRAMUSCULAR | Status: DC

## 2020-01-04 MED ORDER — ACETAMINOPHEN 325 MG PO TABS
650.0000 mg | ORAL_TABLET | ORAL | Status: DC | PRN
  Administered 2020-01-04 – 2020-01-05 (×3): 650 mg via ORAL
  Filled 2020-01-04 (×3): qty 2

## 2020-01-04 MED ORDER — DOCUSATE SODIUM 50 MG PO CAPS
50.0000 mg | ORAL_CAPSULE | Freq: Two times a day (BID) | ORAL | Status: DC
  Administered 2020-01-04 – 2020-01-05 (×2): 50 mg via ORAL
  Filled 2020-01-04 (×5): qty 1

## 2020-01-04 MED ORDER — DIPHENHYDRAMINE HCL 25 MG PO CAPS: 25.0000 mg | ORAL_CAPSULE | Freq: Four times a day (QID) | ORAL | Status: DC | PRN

## 2020-01-04 MED ORDER — WITCH HAZEL-GLYCERIN EX PADS: 1.0000 "application " | MEDICATED_PAD | CUTANEOUS | Status: DC | PRN

## 2020-01-04 MED ORDER — FLEET ENEMA 7-19 GM/118ML RE ENEM: 1.0000 | ENEMA | Freq: Every day | RECTAL | Status: DC | PRN

## 2020-01-04 MED ORDER — MEDROXYPROGESTERONE ACETATE 150 MG/ML IM SUSP: 150.0000 mg | INTRAMUSCULAR | Status: DC | PRN

## 2020-01-04 MED ORDER — BENZOCAINE-MENTHOL 20-0.5 % EX AERO
1.0000 "application " | INHALATION_SPRAY | CUTANEOUS | Status: DC | PRN
  Filled 2020-01-04: qty 56

## 2020-01-04 MED ORDER — AMMONIA AROMATIC IN INHA
RESPIRATORY_TRACT | Status: AC
  Filled 2020-01-04: qty 10

## 2020-01-04 MED ORDER — ONDANSETRON HCL 4 MG/2ML IJ SOLN: 4.0000 mg | INTRAMUSCULAR | Status: DC | PRN

## 2020-01-04 MED ORDER — OXYCODONE HCL 5 MG PO TABS: 5.0000 mg | ORAL_TABLET | ORAL | Status: DC | PRN

## 2020-01-04 MED ORDER — SENNOSIDES-DOCUSATE SODIUM 8.6-50 MG PO TABS
2.0000 | ORAL_TABLET | ORAL | Status: DC
  Administered 2020-01-05 – 2020-01-06 (×2): 2 via ORAL
  Filled 2020-01-04 (×3): qty 2

## 2020-01-04 MED ORDER — OXYCODONE HCL 5 MG PO TABS: 10.0000 mg | ORAL_TABLET | ORAL | Status: DC | PRN

## 2020-01-04 MED ORDER — IBUPROFEN 600 MG PO TABS
600.0000 mg | ORAL_TABLET | Freq: Four times a day (QID) | ORAL | Status: DC
  Administered 2020-01-04 – 2020-01-06 (×9): 600 mg via ORAL
  Filled 2020-01-04 (×9): qty 1

## 2020-01-04 MED ORDER — DIBUCAINE (PERIANAL) 1 % EX OINT: 1.0000 "application " | TOPICAL_OINTMENT | CUTANEOUS | Status: DC | PRN

## 2020-01-04 MED ORDER — ZOLPIDEM TARTRATE 5 MG PO TABS: 5.0000 mg | ORAL_TABLET | Freq: Every evening | ORAL | Status: DC | PRN

## 2020-01-04 MED ORDER — BISACODYL 10 MG RE SUPP: 10.0000 mg | Freq: Every day | RECTAL | Status: DC | PRN

## 2020-01-04 MED ORDER — ONDANSETRON HCL 4 MG PO TABS: 4.0000 mg | ORAL_TABLET | ORAL | Status: DC | PRN

## 2020-01-04 MED ORDER — SIMETHICONE 80 MG PO CHEW: 80.0000 mg | CHEWABLE_TABLET | ORAL | Status: DC | PRN

## 2020-01-04 MED ORDER — MEASLES, MUMPS & RUBELLA VAC IJ SOLR
0.5000 mL | Freq: Once | INTRAMUSCULAR | Status: AC
  Administered 2020-01-05: 0.5 mL via SUBCUTANEOUS
  Filled 2020-01-04: qty 0.5

## 2020-01-04 MED ORDER — PRENATAL MULTIVITAMIN CH
1.0000 | ORAL_TABLET | Freq: Every day | ORAL | Status: DC
  Administered 2020-01-04 – 2020-01-05 (×2): 1 via ORAL
  Filled 2020-01-04 (×2): qty 1

## 2020-01-04 MED ORDER — PANTOPRAZOLE SODIUM 40 MG PO TBEC
40.0000 mg | DELAYED_RELEASE_TABLET | Freq: Every day | ORAL | Status: DC
  Administered 2020-01-05: 40 mg via ORAL
  Filled 2020-01-04 (×3): qty 1

## 2020-01-04 MED ORDER — VALACYCLOVIR HCL 500 MG PO TABS
500.0000 mg | ORAL_TABLET | Freq: Every day | ORAL | Status: DC
  Administered 2020-01-04 – 2020-01-05 (×2): 500 mg via ORAL
  Filled 2020-01-04 (×2): qty 1

## 2020-01-04 MED ORDER — LORATADINE 10 MG PO TABS
10.0000 mg | ORAL_TABLET | Freq: Every day | ORAL | Status: DC
  Administered 2020-01-05: 10 mg via ORAL
  Filled 2020-01-04 (×3): qty 1

## 2020-01-04 MED ORDER — COCONUT OIL OIL: 1.0000 "application " | TOPICAL_OIL | Status: DC | PRN

## 2020-01-04 NOTE — Progress Notes (Signed)
Patient grew faint in bathroom after standing up from toilet.  Sat patient back down, called for help.  Patient passed out.  Activated ammonia.  Patient responsive and feeling better.  Moved to bed with wheelchair.  Patient vomited once in bed x1.  VSS, fundus firm, lochia small.

## 2020-01-04 NOTE — Anesthesia Postprocedure Evaluation (Signed)
Anesthesia Post Note  Patient: Jenna Cross  Procedure(s) Performed: AN AD [place]     Patient location during evaluation: Mother Baby Anesthesia Type: Epidural Level of consciousness: awake Pain management: satisfactory to patient Vital Signs Assessment: post-procedure vital signs reviewed and stable Respiratory status: spontaneous breathing Cardiovascular status: stable Anesthetic complications: no    Last Vitals:  Vitals:   01/04/20 0610 01/04/20 0911  BP: 108/85 (!) 109/40  Pulse: (!) 101 92  Resp: 18 17  Temp: 37.2 C 36.8 C  SpO2: 100% 99%    Last Pain:  Vitals:   01/04/20 0911  TempSrc: Oral  PainSc:    Pain Goal:                   Casimer Lanius

## 2020-01-04 NOTE — Lactation Note (Signed)
This note was copied from a baby's chart. Lactation Consultation Note  Patient Name: Jenna Cross Triana [id number] Date: 01/04/2020 Reason for consult: Initial assessment;Primapara;1st time breastfeeding;Early term 2-38.6wks  Baby is 7 hours old. Baby showed hunger cues during visit. Assisted mother with a football latch on left breast. Provided support with pillows. Mother complained of some discomfort and unlatched and relatched baby a few times. Nipple appeared flatten after removing baby. Mother was comfortable after readjusting positioning. Reviewed breastfeeding basics. Baby previously latch on right breast for 30 minutes.    Questions answered. Discussed milk coming to volume. Instructed to feed baby following hunger cues and to call for assistance when needed. Breastfeeding consultation and services information given and reviewed.    Maternal Data Has patient been taught Hand Expression?: Yes Does the patient have breastfeeding experience prior to this delivery?: No  Feeding Feeding Type: Breast Fed  LATCH Score Latch: Grasps breast easily, tongue down, lips flanged, rhythmical sucking.  Audible Swallowing: A few with stimulation  Type of Nipple: Everted at rest and after stimulation  Comfort (Breast/Nipple): Soft / non-tender  Hold (Positioning): Assistance needed to correctly position infant at breast and maintain latch.  LATCH Score: 8  Interventions Interventions: Breast feeding basics reviewed;Assisted with latch;Breast massage;Adjust position;Support pillows;Position options  Lactation Tools Discussed/Used     Consult Status Consult Status: Follow-up Date: 01/05/20 Follow-up type: In-patient    Franco Duley [name] 01/04/2020, 10:34 AM

## 2020-01-04 NOTE — Progress Notes (Signed)
Post Partum Day 0 Subjective: Doing well. Got light headed this morning and "passed out". Recovered by nursing.  Unsure if low BP.  Bleeding and pain is minimal Objective: Blood pressure 115/65, pulse 95, temperature 98.4 F (36.9 C), temperature source Oral, resp. rate 17, height 5\' 4"  (1.626 m), weight 81.6 kg, SpO2 99 %, unknown if currently breastfeeding.  Physical Exam:  General: alert, cooperative, appears stated age and no distress Lochia: appropriate Uterine Fundus: firm Incision: healing well DVT Evaluation: No evidence of DVT seen on physical exam.  Recent Labs    01/03/20 1629 01/04/20 0544  HGB 12.1 10.2*  HCT 36.6 30.7*    Assessment/Plan: Circumcision prior to discharge.  Discussed R&B and informed consent obtained.  Recheck CBC in am Doing well.  No subsequent light headedness.   LOS: 1 day   Jenna Cross 01/04/2020, 6:28 PM

## 2020-01-04 NOTE — Progress Notes (Signed)
Patient comfortable with epidural  FHR Category 1  Cervix is anterior lip/+1 vertex in OP position   Anticipate Pushing soon

## 2020-01-04 NOTE — Progress Notes (Signed)
MOB was referred for history of depression/anxiety.  * Referral screened out by Clinical Social Worker because none of the following criteria appear to apply:  ~ History of anxiety/depression during this pregnancy, or of post-partum depression following prior delivery. ~ Diagnosis of anxiety and/or depression within last 3 years. Per chart review, appears depression/anxiety date back to 2017. No concerns noted in [facility] records.  OR * MOB's symptoms currently being treated with medication and/or therapy.  Please contact the Clinical Social Worker if needs arise, by [facility] request, or if MOB scores greater than 9/yes to question 10 on Edinburgh Postpartum Depression Screen.  Elijio Miles, LCSW [facility] [employer] [phone]

## 2020-01-04 NOTE — Telephone Encounter (Signed)
Preadmission screen  

## 2020-01-05 ENCOUNTER — Telehealth (HOSPITAL_COMMUNITY): Payer: Self-pay | Admitting: *Deleted

## 2020-01-05 LAB — CBC
HCT: 21.8 % — ABNORMAL LOW (ref 36.0–46.0)
HCT: 23.3 % — ABNORMAL LOW (ref 36.0–46.0)
Hemoglobin: 7.2 g/dL — ABNORMAL LOW (ref 12.0–15.0)
Hemoglobin: 7.6 g/dL — ABNORMAL LOW (ref 12.0–15.0)
MCH: 32 pg (ref 26.0–34.0)
MCH: 31.9 pg (ref 26.0–34.0)
MCHC: 33 g/dL (ref 30.0–36.0)
MCHC: 32.6 g/dL (ref 30.0–36.0)
MCV: 96.9 fL (ref 80.0–100.0)
MCV: 97.9 fL (ref 80.0–100.0)
Platelets: 156 10*3/uL (ref 150–400)
Platelets: 161 10*3/uL (ref 150–400)
RBC: 2.25 MIL/uL — ABNORMAL LOW (ref 3.87–5.11)
RBC: 2.38 MIL/uL — ABNORMAL LOW (ref 3.87–5.11)
RDW: 13.6 % (ref 11.5–15.5)
RDW: 13.6 % (ref 11.5–15.5)
WBC: 12.5 10*3/uL — ABNORMAL HIGH (ref 4.0–10.5)
WBC: 12.3 10*3/uL — ABNORMAL HIGH (ref 4.0–10.5)
nRBC: 0 % (ref 0.0–0.2)
nRBC: 0 % (ref 0.0–0.2)

## 2020-01-05 LAB — FERRITIN: Ferritin: 19 ng/mL (ref 11–307)

## 2020-01-05 MED ORDER — IBUPROFEN 600 MG PO TABS: 600.0000 mg | ORAL_TABLET | Freq: Four times a day (QID) | ORAL | 0 refills | Status: DC | PRN

## 2020-01-05 MED ORDER — FERROUS SULFATE 325 (65 FE) MG PO TABS
325.0000 mg | ORAL_TABLET | Freq: Two times a day (BID) | ORAL | Status: DC
  Administered 2020-01-05: 325 mg via ORAL
  Filled 2020-01-05 (×2): qty 1

## 2020-01-05 MED ORDER — DOCUSATE SODIUM 100 MG PO CAPS: 100.0000 mg | ORAL_CAPSULE | Freq: Two times a day (BID) | ORAL | 2 refills | Status: DC

## 2020-01-05 MED ORDER — FERROUS SULFATE 325 (65 FE) MG PO TBEC: 325.0000 mg | DELAYED_RELEASE_TABLET | Freq: Two times a day (BID) | ORAL | 2 refills | Status: DC

## 2020-01-05 MED ORDER — POLYETHYLENE GLYCOL 3350 17 G PO PACK: 17.0000 g | PACK | Freq: Every day | ORAL | Status: DC | PRN

## 2020-01-05 NOTE — Lactation Note (Signed)
This note was copied from a baby's chart. Lactation Consultation Note  Patient Name: Jenna Cross [id number] Date: 01/05/2020 Reason for consult: Follow-up assessment;Early term 9-38.6wks;Primapara Baby is 50 hours old.  Mom states breastfeeding is going well.  No concerns.  Discussed milk coming to volume and the prevention and treatment of engorgement.  Mom has a breast pump at home.  Discussed pumping for storage of milk. Questions answered.  Reviewed lactation outpatient services and encouraged to call prn.  Maternal Data    Feeding Feeding Type: Breast Fed  LATCH Score                   Interventions    Lactation Tools Discussed/Used     Consult Status Consult Status: Complete Follow-up type: Call as needed    Ave Filter 01/05/2020, 1:21 PM

## 2020-01-05 NOTE — Progress Notes (Signed)
Post Partum Day 1 Subjective: up ad lib, voiding and tolerating PO. Pain tolerable.Worried about first BM. Hx chronic constipation and qas requiring daily colace prior to delivery. Last BM Sunday later evening. Requesting miralax in addition to current bowel regimen. No further syncope or pre-syncope. No dizziness with ambulation. No SOB/CP. Last changed pad at 4 hours ago.   Objective: Blood pressure (!) 100/37, pulse 89, temperature 98.4 F (36.9 C), temperature source Oral, resp. rate 18, height 5\' 4"  (1.626 m), weight 81.6 kg, SpO2 100 %, unknown if currently breastfeeding.  Physical Exam:  General: alert Lochia: appropriate. Pad not saturated and was changed 4 hours ago.  Uterine Fundus: firm Incision: healing well Gentle bimanual exam w/out evidence of hematoma DVT Evaluation: No evidence of DVT seen on physical exam.  Recent Labs    01/04/20 0544 01/05/20 0720  HGB 10.2* 7.2*  HCT 30.7* 21.8*    Assessment/Plan:  34 yo G1P1 PPD#1 s/p VAVD with acute blood loss anemia and otherwise progressing well postpartum.   # Acute blood loss anemia: Syncope yesterday but stable since then. Repeat BP this am (not documented yet) 100s/50s - pt runs low in general. Normal HR. Benign exam w/out evidence of hematoma or active abnormal bleeding.   - repeat CBC with ferritin level to ensure not spurious value - D/w pt IV iron vs pRBC depending on results/if symptomatic  # PP care:  - encourage continued ambulation - VF - Senokot ordered. Increase colace to BID. Add miralax if no BM by tomorrow. Pt reassured re: BM timing. Cautioned re: loose stool if bowel regimen too aggressive.  - Continue BF-ing - Baby boy s/p circ and doing well    LOS: 2 days   Tyson Dense 01/05/2020, 9:20 AM

## 2020-01-05 NOTE — Discharge Summary (Signed)
Obstetric Discharge Summary Reason for Admission: rupture of membranes Prenatal Procedures: none Intrapartum Procedures: vacuum Postpartum Procedures: none Complications-Operative and Postpartum: anemia  Hgb stable at 7.6 and pt completely asymptomatic. Vitals good. Desires discharge with strict precautions given.    Hemoglobin  Date Value Ref Range Status  01/05/2020 7.6 (L) 12.0 - 15.0 g/dL Final   HCT  Date Value Ref Range Status  01/05/2020 23.3 (L) 36.0 - 46.0 % Final    Physical Exam:  General: alert Lochia: appropriate Uterine Fundus: firm Incision: healing well DVT Evaluation: No evidence of DVT seen on physical exam.  Discharge Diagnoses: Term Pregnancy-delivered and anemia  Discharge Information: Date: 01/05/2020 Activity: pelvic rest Diet: routine Medications: PNV, Ibuprofen, Colace and Iron Condition: stable Instructions: refer to practice specific booklet Discharge to: home   Newborn Data: Live born female  Birth Weight: 7 lb 5.1 oz (3320 g) APGAR: 7, 9  Newborn Delivery   Birth date/time: 01/04/2020 03:09:00 Delivery type: Vaginal, Vacuum (Extractor)      Home with mother.  Jenna Cross 01/05/2020, 12:32 PM

## 2020-01-05 NOTE — Telephone Encounter (Signed)
Preadmission screen  

## 2020-01-06 LAB — CBC
HCT: 22.1 % — ABNORMAL LOW (ref 36.0–46.0)
Hemoglobin: 7.2 g/dL — ABNORMAL LOW (ref 12.0–15.0)
MCH: 32.3 pg (ref 26.0–34.0)
MCHC: 32.6 g/dL (ref 30.0–36.0)
MCV: 99.1 fL (ref 80.0–100.0)
Platelets: 175 10*3/uL (ref 150–400)
RBC: 2.23 MIL/uL — ABNORMAL LOW (ref 3.87–5.11)
RDW: 13.7 % (ref 11.5–15.5)
WBC: 11.4 10*3/uL — ABNORMAL HIGH (ref 4.0–10.5)
nRBC: 0 % (ref 0.0–0.2)

## 2020-01-06 NOTE — Progress Notes (Signed)
Post Partum Day 2 Subjective: no complaints, up ad lib, voiding, tolerating PO and + flatus No dizziness, ambulating well Objective: Blood pressure (!) 116/57, pulse 100, temperature 98.9 F (37.2 C), temperature source Oral, resp. rate 18, height 5\' 4"  (1.626 m), weight 81.6 kg, SpO2 100 %, unknown if currently breastfeeding.  Physical Exam:  General: alert, cooperative and no distress Lochia: appropriate Uterine Fundus: firm Incision: healing well DVT Evaluation: No evidence of DVT seen on physical exam.  Recent Labs    01/05/20 1046 01/06/20 0553  HGB 7.6* 7.2*  HCT 23.3* 22.1*    Assessment/Plan: Discharge home Baby had a very brief dusky episode while on the breast yesterday>Peds wanted to observe. He is doing well today.  LOS: 3 days   Shon Millet II 01/06/2020, 7:34 AM

## 2020-01-06 NOTE — Lactation Note (Signed)
This note was copied from a baby's chart. Lactation Consultation Note  Patient Name: Jenna Cross [id number] Date: 01/06/2020 Reason for consult: Follow-up assessment;Early term 37-38.6wks;Primapara;1st time breastfeeding;Infant weight loss;Other (Comment)(8 % weight loss/ LC updated the doc flow sheets per mom and dad - baby fed at 1040 for 25 mins) Baby is 46 hours old @ 50 hours Bili check 10 .  LC reviewed doc flow sheet and updated her mom and dad.  Per mom nipples are sore initially when latching. LC offered to assess breast tissue.  Both nipples appear healthy, may be transient since mom mentioned her breast are heavier, LC noted areola edema at the base of the nipples. LC recommended prior to latch - breast massage, hand express, pre-pump with hand pump if to full and reverse pressure. Mom already has comfort gels . LC instructed mom on the use breast shells when awake. Mom showed [place] her accessory glands both axillary areas , soft to touch. Mom mentioned they have been there most of the pregnancy.  LC recommended when the milk comes in she may find they get full and recommended using cold cabbage leaves on the accessory glands to dry them up.  LC also mentioned if mom is having engorgement issue the accessory glands can be more pronounced.  Engorgement prevention and tx reviewed.  LC instructed mom on the use of the hand pump , and provided the #27 F if needed when the milk comes in , and shells when awake.  Per mom has a DEBP Spectra 1 at home.  Storage of breast milk and cleaning the pump reviewed.  Mom aware of the [facility] resources after D/C and has the pamphlet.   Maternal Data    Feeding Feeding Type: Breast Fed  LATCH Score                   Interventions Interventions: Breast feeding basics reviewed;Comfort gels;Shells;Hand pump  Lactation Tools Discussed/Used Tools: Pump;Shells;Comfort gels;Flanges Flange Size: 27;24 Shell Type: Inverted Breast pump type:  Manual WIC Program: No Pump Review: Setup, frequency, and cleaning;Milk Storage Initiated by:: MAI Date initiated:: 01/06/20   Consult Status Consult Status: Complete Date: 01/06/20    Myer Haff 01/06/2020, 11:36 AM

## 2020-01-08 ENCOUNTER — Encounter (HOSPITAL_COMMUNITY): Payer: Self-pay | Admitting: *Deleted

## 2020-01-08 ENCOUNTER — Telehealth (HOSPITAL_COMMUNITY): Payer: Self-pay | Admitting: *Deleted

## 2020-01-08 NOTE — Telephone Encounter (Signed)
Preadmission screen  

## 2020-01-11 ENCOUNTER — Other Ambulatory Visit (HOSPITAL_COMMUNITY): Payer: Self-pay

## 2020-01-13 ENCOUNTER — Inpatient Hospital Stay (HOSPITAL_COMMUNITY)
Admission: AD | Admit: 2020-01-13 | Payer: BC Managed Care – PPO | Source: Home / Self Care | Admitting: Obstetrics and Gynecology

## 2020-01-13 ENCOUNTER — Inpatient Hospital Stay (HOSPITAL_COMMUNITY): Payer: BC Managed Care – PPO

## 2020-09-07 DIAGNOSIS — U071 COVID-19: Secondary | ICD-10-CM

## 2020-09-07 HISTORY — DX: COVID-19: U07.1

## 2020-09-21 ENCOUNTER — Telehealth: Payer: Self-pay | Admitting: Nurse Practitioner

## 2020-09-21 NOTE — Telephone Encounter (Signed)
Called to Discuss with patient about Covid symptoms and the use of the monoclonal antibody infusion for those with mild to moderate Covid symptoms and at a high risk of hospitalization.     Pt appears to qualify for this infusion due to co-morbid conditions and/or a member of an at-risk group in accordance with the FDA Emergency Use Authorization.    Unable to reach pt. Unable to leave message (Voicemail is full).   Alda Lea, NP [facility]  [phone]

## 2021-01-27 ENCOUNTER — Inpatient Hospital Stay (HOSPITAL_COMMUNITY)
Admission: AD | Admit: 2021-01-27 | Discharge: 2021-02-03 | DRG: 786 | Disposition: A | Discharge status: Home / Self Care | Payer: BC Managed Care – PPO | Attending: Obstetrics and Gynecology | Admitting: Obstetrics and Gynecology

## 2021-01-27 ENCOUNTER — Other Ambulatory Visit: Payer: Self-pay

## 2021-01-27 ENCOUNTER — Encounter (HOSPITAL_COMMUNITY): Payer: Self-pay

## 2021-01-27 DIAGNOSIS — O42913 Preterm premature rupture of membranes, unspecified as to length of time between rupture and onset of labor, third trimester: Principal | ICD-10-CM | POA: Diagnosis present

## 2021-01-27 DIAGNOSIS — O321XX0 Maternal care for breech presentation, not applicable or unspecified: Secondary | ICD-10-CM | POA: Diagnosis present

## 2021-01-27 DIAGNOSIS — O322XX0 Maternal care for transverse and oblique lie, not applicable or unspecified: Secondary | ICD-10-CM | POA: Diagnosis present

## 2021-01-27 DIAGNOSIS — O99892 Other specified diseases and conditions complicating childbirth: Secondary | ICD-10-CM | POA: Diagnosis present

## 2021-01-27 DIAGNOSIS — O99893 Other specified diseases and conditions complicating puerperium: Secondary | ICD-10-CM | POA: Diagnosis not present

## 2021-01-27 DIAGNOSIS — O99824 Streptococcus B carrier state complicating childbirth: Secondary | ICD-10-CM | POA: Diagnosis present

## 2021-01-27 DIAGNOSIS — Z3A34 34 weeks gestation of pregnancy: Secondary | ICD-10-CM | POA: Diagnosis not present

## 2021-01-27 DIAGNOSIS — A6000 Herpesviral infection of urogenital system, unspecified: Secondary | ICD-10-CM | POA: Diagnosis present

## 2021-01-27 DIAGNOSIS — O411230 Chorioamnionitis, third trimester, not applicable or unspecified: Secondary | ICD-10-CM | POA: Diagnosis present

## 2021-01-27 DIAGNOSIS — O42919 Preterm premature rupture of membranes, unspecified as to length of time between rupture and onset of labor, unspecified trimester: Secondary | ICD-10-CM | POA: Diagnosis present

## 2021-01-27 DIAGNOSIS — M542 Cervicalgia: Secondary | ICD-10-CM | POA: Diagnosis not present

## 2021-01-27 DIAGNOSIS — O9902 Anemia complicating childbirth: Secondary | ICD-10-CM | POA: Diagnosis present

## 2021-01-27 DIAGNOSIS — Z363 Encounter for antenatal screening for malformations: Secondary | ICD-10-CM | POA: Diagnosis not present

## 2021-01-27 DIAGNOSIS — O9832 Other infections with a predominantly sexual mode of transmission complicating childbirth: Secondary | ICD-10-CM | POA: Diagnosis present

## 2021-01-27 DIAGNOSIS — Z8759 Personal history of other complications of pregnancy, childbirth and the puerperium: Secondary | ICD-10-CM

## 2021-01-27 DIAGNOSIS — Z20822 Contact with and (suspected) exposure to covid-19: Secondary | ICD-10-CM | POA: Diagnosis present

## 2021-01-27 DIAGNOSIS — Z87891 Personal history of nicotine dependence: Secondary | ICD-10-CM | POA: Diagnosis not present

## 2021-01-27 DIAGNOSIS — K5900 Constipation, unspecified: Secondary | ICD-10-CM | POA: Diagnosis present

## 2021-01-27 LAB — TYPE AND SCREEN
ABO/RH(D): O POS
Antibody Screen: NEGATIVE

## 2021-01-27 LAB — CBC
HCT: 30.2 % — ABNORMAL LOW (ref 36.0–46.0)
Hemoglobin: 10 g/dL — ABNORMAL LOW (ref 12.0–15.0)
MCH: 31.3 pg (ref 26.0–34.0)
MCHC: 33.1 g/dL (ref 30.0–36.0)
MCV: 94.7 fL (ref 80.0–100.0)
Platelets: 242 10*3/uL (ref 150–400)
RBC: 3.19 MIL/uL — ABNORMAL LOW (ref 3.87–5.11)
RDW: 13.2 % (ref 11.5–15.5)
WBC: 12.3 10*3/uL — ABNORMAL HIGH (ref 4.0–10.5)
nRBC: 0 % (ref 0.0–0.2)

## 2021-01-27 LAB — SARS CORONAVIRUS 2 (TAT 6-24 HRS): SARS Coronavirus 2: NEGATIVE

## 2021-01-27 MED ORDER — CALCIUM CARBONATE ANTACID 500 MG PO CHEW: 2.0000 | CHEWABLE_TABLET | ORAL | Status: DC | PRN

## 2021-01-27 MED ORDER — DOCUSATE SODIUM 100 MG PO CAPS
200.0000 mg | ORAL_CAPSULE | Freq: Every day | ORAL | Status: DC
  Administered 2021-01-27 – 2021-01-30 (×4): 200 mg via ORAL
  Filled 2021-01-27 (×4): qty 2

## 2021-01-27 MED ORDER — AZITHROMYCIN 250 MG PO TABS
1000.0000 mg | ORAL_TABLET | Freq: Once | ORAL | Status: AC
  Administered 2021-01-27: 1000 mg via ORAL
  Filled 2021-01-27: qty 4

## 2021-01-27 MED ORDER — PANTOPRAZOLE SODIUM 40 MG PO TBEC
40.0000 mg | DELAYED_RELEASE_TABLET | Freq: Every day | ORAL | Status: DC
  Administered 2021-01-27 – 2021-02-02 (×7): 40 mg via ORAL
  Filled 2021-01-27 (×7): qty 1

## 2021-01-27 MED ORDER — PANTOPRAZOLE SODIUM 40 MG PO TBEC: 40.0000 mg | DELAYED_RELEASE_TABLET | Freq: Every day | ORAL | Status: DC

## 2021-01-27 MED ORDER — VALACYCLOVIR HCL 500 MG PO TABS: 500.0000 mg | ORAL_TABLET | Freq: Two times a day (BID) | ORAL | Status: DC

## 2021-01-27 MED ORDER — PRENATAL MULTIVITAMIN CH
1.0000 | ORAL_TABLET | Freq: Every day | ORAL | Status: DC
  Administered 2021-01-28 – 2021-01-30 (×3): 1 via ORAL
  Filled 2021-01-27 (×3): qty 1

## 2021-01-27 MED ORDER — POLYETHYLENE GLYCOL 3350 17 G PO PACK
17.0000 g | PACK | Freq: Every day | ORAL | Status: DC
  Administered 2021-01-27 – 2021-02-03 (×8): 17 g via ORAL
  Filled 2021-01-27 (×8): qty 1

## 2021-01-27 MED ORDER — DOCUSATE SODIUM 100 MG PO CAPS: 100.0000 mg | ORAL_CAPSULE | Freq: Every day | ORAL | Status: DC

## 2021-01-27 MED ORDER — LORATADINE 10 MG PO TABS
10.0000 mg | ORAL_TABLET | Freq: Every day | ORAL | Status: DC
  Administered 2021-01-27 – 2021-02-02 (×7): 10 mg via ORAL
  Filled 2021-01-27 (×7): qty 1

## 2021-01-27 MED ORDER — ZOLPIDEM TARTRATE 5 MG PO TABS
5.0000 mg | ORAL_TABLET | Freq: Every evening | ORAL | Status: DC | PRN
  Administered 2021-01-27 – 2021-01-30 (×4): 5 mg via ORAL
  Filled 2021-01-27 (×4): qty 1

## 2021-01-27 MED ORDER — DOCUSATE SODIUM 100 MG PO CAPS: 200.0000 mg | ORAL_CAPSULE | Freq: Every day | ORAL | Status: DC

## 2021-01-27 MED ORDER — AMOXICILLIN 500 MG PO CAPS
500.0000 mg | ORAL_CAPSULE | Freq: Three times a day (TID) | ORAL | Status: DC
  Administered 2021-01-29 – 2021-01-30 (×5): 500 mg via ORAL
  Filled 2021-01-27 (×5): qty 1

## 2021-01-27 MED ORDER — VALACYCLOVIR HCL 500 MG PO TABS
500.0000 mg | ORAL_TABLET | Freq: Every day | ORAL | Status: DC
  Administered 2021-01-27 – 2021-02-02 (×7): 500 mg via ORAL
  Filled 2021-01-27 (×7): qty 1

## 2021-01-27 MED ORDER — SODIUM CHLORIDE 0.9 % IV SOLN
2.0000 g | Freq: Four times a day (QID) | INTRAVENOUS | Status: AC
  Administered 2021-01-27 – 2021-01-29 (×8): 2 g via INTRAVENOUS
  Filled 2021-01-27 (×8): qty 2000

## 2021-01-27 MED ORDER — BETAMETHASONE SOD PHOS & ACET 6 (3-3) MG/ML IJ SUSP
12.0000 mg | INTRAMUSCULAR | Status: AC
  Administered 2021-01-27 – 2021-01-28 (×2): 12 mg via INTRAMUSCULAR
  Filled 2021-01-27: qty 5

## 2021-01-27 MED ORDER — POLYETHYLENE GLYCOL 3350 17 G PO PACK: 17.0000 g | PACK | Freq: Every day | ORAL | Status: DC

## 2021-01-27 MED ORDER — ACETAMINOPHEN 325 MG PO TABS
650.0000 mg | ORAL_TABLET | ORAL | Status: DC | PRN
  Administered 2021-01-30 – 2021-01-31 (×2): 650 mg via ORAL
  Filled 2021-01-27 (×2): qty 2

## 2021-01-27 NOTE — H&P (Signed)
Jenna Cross is a 35 y.o. female presenting for PPROM today at 0500 for clear fluid. She continues to leak. 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  She had a growth [place] in the office on 4/6 that showed an EFW of 4#15 (92%ile).  She has a history of a VAVD s/s nrFHT of a 7#5 infant Jenna Cross). That delivery c/b anemia with hgb of 7.6 on discharge.  She has anxiety that is well controlled without medications. She is expecting another boy - "Jenna Cross"!!     OB History    Gravida  2   Para  1   Term  1   Preterm  0   AB  0   Living  1     SAB  0   IAB  0   Ectopic  0   Multiple      Live Births  1          Past Medical History:  Diagnosis Date  . Anxiety   . Anxiety    no medication   Past Surgical History:  Procedure Laterality Date  . BREAST SURGERY     cyst removal from right breast   . excision of fibroadenoma of breast    . TONSILLECTOMY     and adenoids  . TONSILLECTOMY AND ADENOIDECTOMY    . WISDOM TOOTH EXTRACTION     Family History: family history includes Migraines in her mother. Social History:  reports that she has quit smoking. She has never used smokeless tobacco. She reports previous alcohol use. She reports that she does not use drugs.     Maternal Diabetes: No Genetic Screening: Normal Maternal Ultrasounds/Referrals: Normal Fetal Ultrasounds or other Referrals:  None Maternal Substance Abuse:  No Significant Maternal Medications:  None Significant Maternal Lab Results:  None Other Comments:  None  Review of Systems History   unknown if currently breastfeeding. Exam Physical Exam  NAD, A&O NWOB Abd soft, nondistended, gravid SVE deferred given s/p PPROM and patient w/out contractions  Prenatal labs: ABO, Rh:  O+ Antibody:  neg Rubella:  Immune RPR:   NR HBsAg:   NR  HIV:   NR GBS:   unknown  Assessment/Plan: 35 yo G2P1001 presenting at 62.3 wga with PPROM.   # PPROM - counseled regarding risks of  expectant management including abruption, infection, and IUFD. Discussed indications for delivery including infection, labor, abruption or non-reassuring fetal status.  - Immediate delivery after BMZ vs expectant management was d/w patient in detail. I reviewed that historically, we delivered patients at 32 wga for PPROM. However, recent studies demonstrated that they are potential benefits to expectant management until 37.0 wga. One large study showed no significant difference in neonatal sepsis or morbidity but an increased risk of respiratory distress, NICU length of stay, and mechanical ventilation in the immediate delivery group. However, the maternal outcomes were 2 times higher for hemorrhage and infection in the expectant management to 85 wga group. Another study showed results and in addition showed higher risk of cesarean with immediate delivery.  We reviewed that latency antibiotics are not indicated if she desires expectant management. We would treat her presumptively with PCN for GBS unknown until BMZ complete and then only if labors and GBS positive.  I reiterated that expectant management would include VERY careful monitoring of s/s of chorio, maternal infection, and antepartum hemorrhage.  - After careful consideration, she elects latency antibiotics and expectant management.  - We discussed tocolysis is not typically indicated  as it can be associated with a higher risk of chorioamnionitis.  - BMZ for FLM - Latency abx as above - CBC q 72 hours - No s/s of abruption, infection, or labor. - SVE deferred given patient is comfortable.   # MWB: - anxiety - stable off meds.  - HSV - daily valtrex suppression. No outbreaks on exam today.   - Anemia - continue PNV with iron. Hgb 10 on admission.  - Constipation - continue colace bid and miralax once a day  # FWB:  - BMZ series as above - EFW 4#15 [place] 4/6 (92%ile) - MFM [place] at 35.0 wga (will be ~3 weeks from last growth) - CEFM until  bedtime today. As long as remains stable, will switch to NST q shift - NICU consultation requested  # GBS: Unknown - GBS swab performed - latency abx will cover for now  # ROD - vertex on [place] in office today. Anticipate SVD.    Jenna Cross 01/27/2021, 2:33 PM

## 2021-01-28 MED ORDER — SODIUM CHLORIDE 0.9 % IV SOLN
INTRAVENOUS | Status: DC | PRN
  Administered 2021-01-28: 250 mL via INTRAVENOUS
  Administered 2021-01-29: 10 mL via INTRAVENOUS

## 2021-01-28 NOTE — Consult Note (Signed)
Neonatology Consult to Antenatal Patient:  I was asked by Dr. Royston Sinner to see this patient in order to provide antenatal counseling due to prematurity.  Jenna Cross was admitted 01/27/21 at 34 4/[redacted] weeks GA due to ROM this morning at 0500. She is currently not having active labor. She is now BMZ complete and is getting IV Ampicillin.  Pregnancy otherwise uncomplicated except for HSV on Valtrex no active lesions. Good support from husband and family; of note, mGF is local long-standing OB.  I spoke with the patient and husband. We discussed the worst case of delivery in the next 1-2 days, including usual DR management, possible respiratory complications and need for support, IV access, feedings (mother desires breast feeding, which was encouraged), LOS, Mortality and Morbidity, and long term outcomes.Questions answered.  I/we would be glad to come back if she has more questions later.  Thank you for asking me to see this patient.  Jenna Sabal Katherina Mires, MD Neonatologist  The total length of face-to-face or floor/unit time for this encounter was 25 minutes. Counseling and/or coordination of care was 40 minutes of the above.

## 2021-01-28 NOTE — Progress Notes (Signed)
S: Doing well. Denies contractions and bleeding. +FM. Continues to leak clear fluid. Denies fevers/chills.   O:  Today's Vitals   01/28/21 0400 01/28/21 0515 01/28/21 0600 01/28/21 0813  BP:    (!) 113/52  Pulse:    91  Resp:    18  Temp:  97.8 F (36.6 C)    TempSrc:      SpO2:    100%  Weight:      Height:      PainSc: Asleep  Asleep    Body mass index is 30.9 kg/m.  NAD, A&O NWOB Abd soft, nondistended, gravid, nontender  35 yo G2P1001 presenting at 17.3 wga with PPROM today she is 34.4 wga.   # PPROM - counseled regarding risks of expectant management - After careful consideration, she elects latency antibiotics and expectant management until 25 wga.  - We discussed tocolysis is not typically indicated as it can be associated with a higher risk of chorioamnionitis.  - BMZ for FLM - Latency abx as above - CBC q 72 hours (next Monday AM is ordered) - No s/s of abruption, infection, or labor. - SVE deferred given patient is comfortable.   # MWB: - anxiety - stable off meds.  - HSV - daily valtrex - Anemia - continue PNV with iron. Hgb 10 on admission.  - Constipation - continue colace bid and miralax once a day  # FWB:  - BMZ series as above - EFW 4#15 [place] 4/6 (92%ile) - MFM [place] at 35.0 wga (will be ~3 weeks from last growth) - NST q shift - NICU consultation requested  # GBS: Unknown - GBS swab performed and pending - latency abx will cover for now  # ROD - vertex on [place] 4/22. Anticipate SVD.   Lucillie Garfinkel MD

## 2021-01-29 LAB — CULTURE, BETA STREP (GROUP B ONLY)

## 2021-01-29 MED ORDER — DOCUSATE SODIUM 100 MG PO CAPS
100.0000 mg | ORAL_CAPSULE | Freq: Every day | ORAL | Status: DC
  Administered 2021-01-29 – 2021-01-30 (×2): 100 mg via ORAL
  Filled 2021-01-29 (×2): qty 1

## 2021-01-29 NOTE — Progress Notes (Addendum)
S: Saw NICU yesterday and questions were answered. Denies contractions and bleeding. Continues to feeld well. +FM. Continues to leak clear fluid but had larger gushes yesterday so was a little concerned. Denies fevers/chills. Did not have a BM yesterday and requests we increase her colace to 300mg .  O:  Today's Vitals   01/29/21 0225 01/29/21 0340 01/29/21 0500 01/29/21 0617  BP:   (!) 116/55   Pulse:   99   Resp:   15   Temp:   98.2 F (36.8 C)   TempSrc:   Oral   SpO2:   100%   Weight:      Height:      PainSc: Asleep Asleep 0-No pain Asleep   Body mass index is 30.9 kg/m.  NAD, A&O NWOB Abd soft, nondistended, gravid, nontender  35 yo G2P1001 presenting at 85.3 wga with PPROM today she is 34.5 wga.   # PPROM - counseled regarding risks of expectant management - We reviewed that she will continue to leak because baby continues to make fluid.  - After careful consideration, she elects latency antibiotics and expectant management until 39 wga.  - We discussed tocolysis is not typically indicated as it can be associated with a higher risk of chorioamnionitis.  - BMZ for FLM (4/22 and 4/23) - CBC q 72 hours (due Monday AM and is ordered) - No s/s of abruption, infection, or labor. - SVE deferred given patient is comfortable.   # MWB: - anxiety - stable off meds.  - HSV - daily valtrex - Anemia - continue PNV with iron. Hgb 10 on admission.  - Constipation - continue colace bid and miralax once a day  # FWB:  - BMZ 4/22 and 4/23 - EFW 4#15 [place] 4/6 (92%ile) - MFM [place] at 57 wga (will be ~3 weeks from last growth - ordered for tomorrow am) - NST q shift - s/p NICU consultat  # GBS: Unknown - GBS swab performed and pending - latency abx will cover for now  # ROD - vertex on [place] 4/22. Anticipate SVD.   Lucillie Garfinkel MD

## 2021-01-30 ENCOUNTER — Inpatient Hospital Stay (HOSPITAL_BASED_OUTPATIENT_CLINIC_OR_DEPARTMENT_OTHER): Payer: BC Managed Care – PPO

## 2021-01-30 DIAGNOSIS — Z3A34 34 weeks gestation of pregnancy: Secondary | ICD-10-CM | POA: Diagnosis not present

## 2021-01-30 DIAGNOSIS — O42913 Preterm premature rupture of membranes, unspecified as to length of time between rupture and onset of labor, third trimester: Secondary | ICD-10-CM

## 2021-01-30 DIAGNOSIS — Z363 Encounter for antenatal screening for malformations: Secondary | ICD-10-CM

## 2021-01-30 LAB — CBC
HCT: 29.4 % — ABNORMAL LOW (ref 36.0–46.0)
Hemoglobin: 9.5 g/dL — ABNORMAL LOW (ref 12.0–15.0)
MCH: 31.1 pg (ref 26.0–34.0)
MCHC: 32.3 g/dL (ref 30.0–36.0)
MCV: 96.4 fL (ref 80.0–100.0)
Platelets: 246 10*3/uL (ref 150–400)
RBC: 3.05 MIL/uL — ABNORMAL LOW (ref 3.87–5.11)
RDW: 13.5 % (ref 11.5–15.5)
WBC: 13.6 10*3/uL — ABNORMAL HIGH (ref 4.0–10.5)
nRBC: 0.4 % — ABNORMAL HIGH (ref 0.0–0.2)

## 2021-01-30 IMAGING — US US MFM OB DETAIL+14 WK
1 series · 14 of 28 positions shown · non-contrast
Comparison: none

[Series 1: us mfm ob detail+14 wk · 58 acquisitions, 14 frames shown]
[im 3/58]
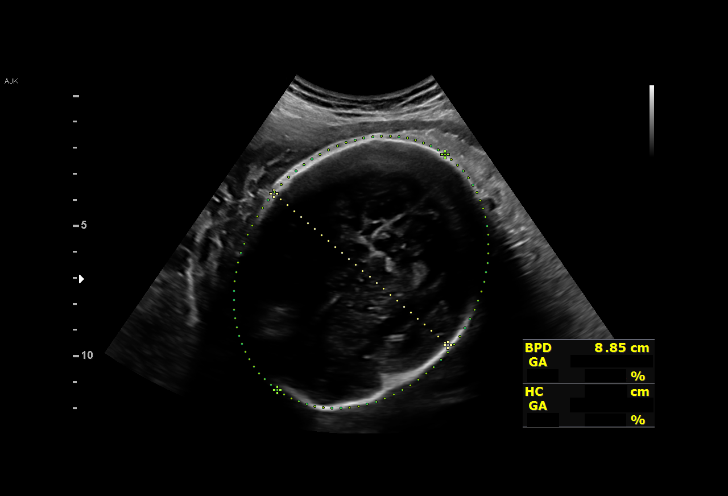
[im 7/58]
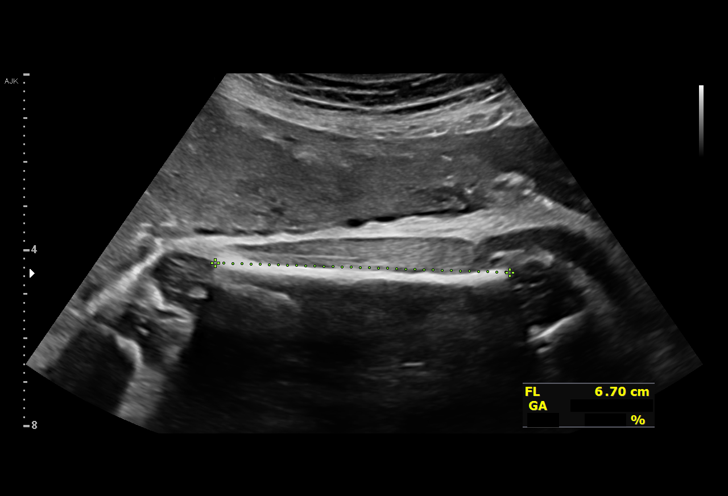
[im 11/58]
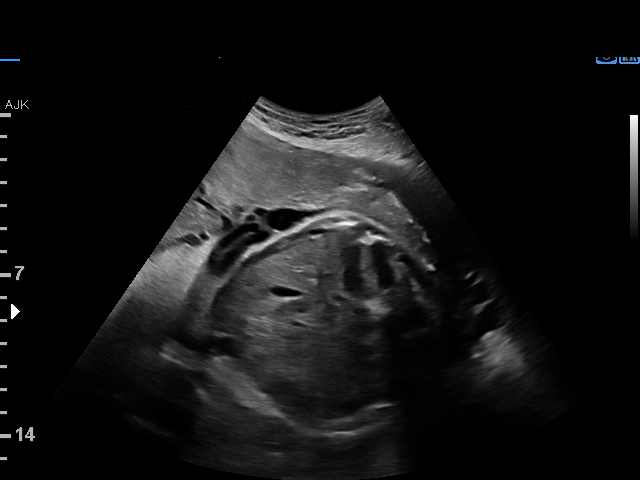
[im 15/58]
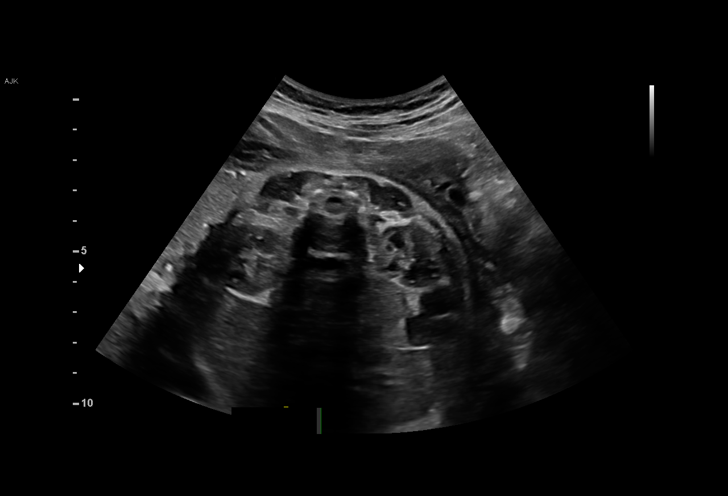
[im 20/58]
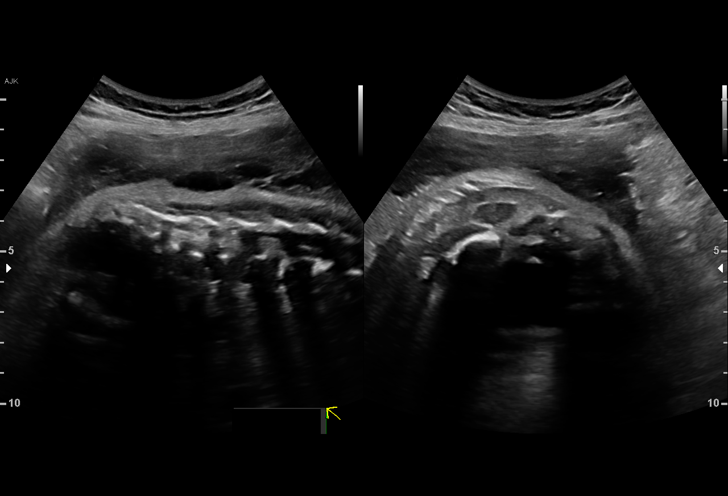
[im 24/58]
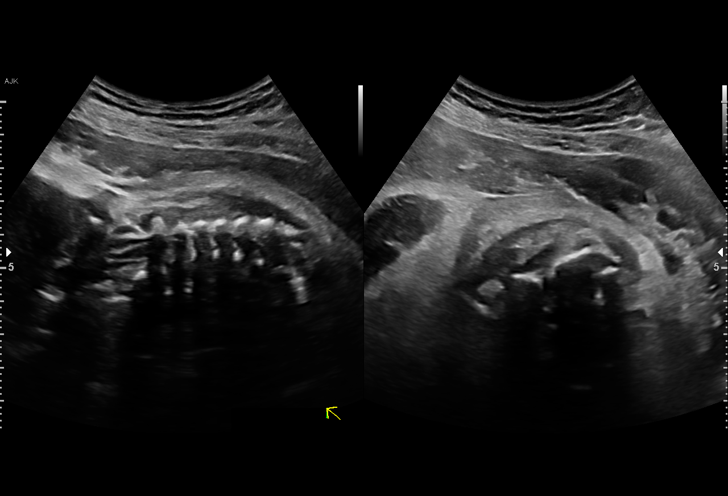
[im 28/58]
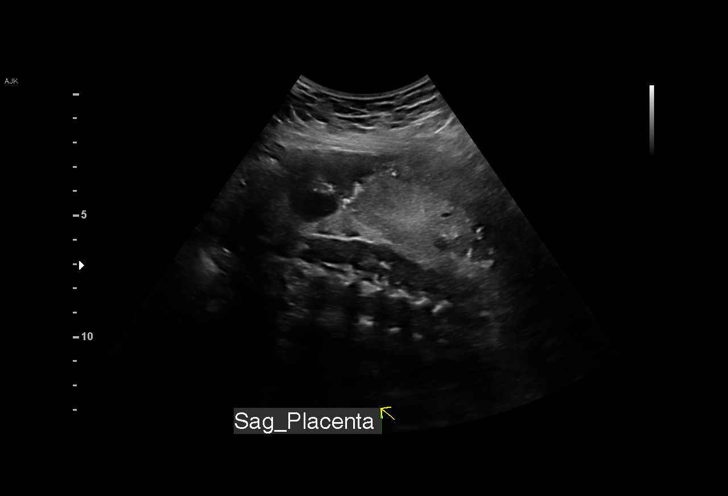
[im 32/58]
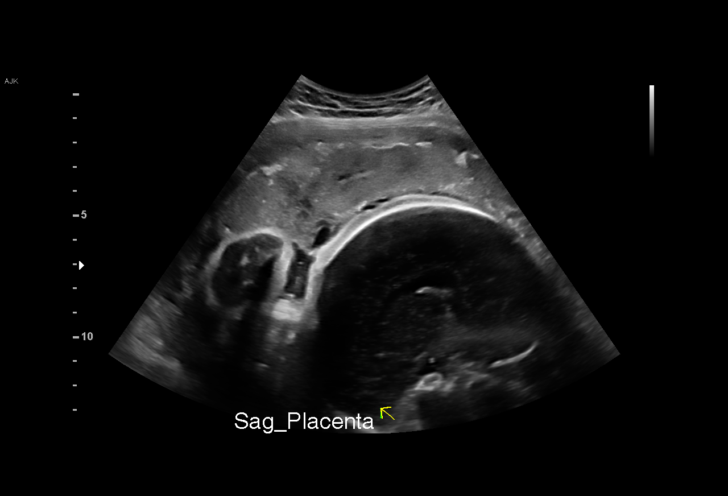
[im 36/58]
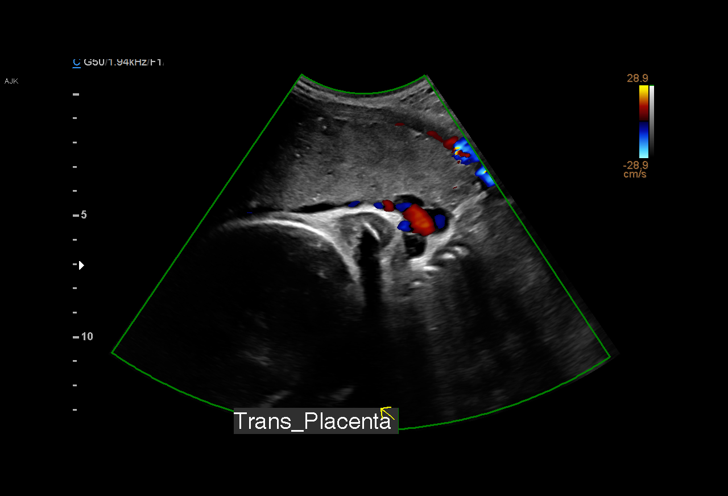
[im 41/58]
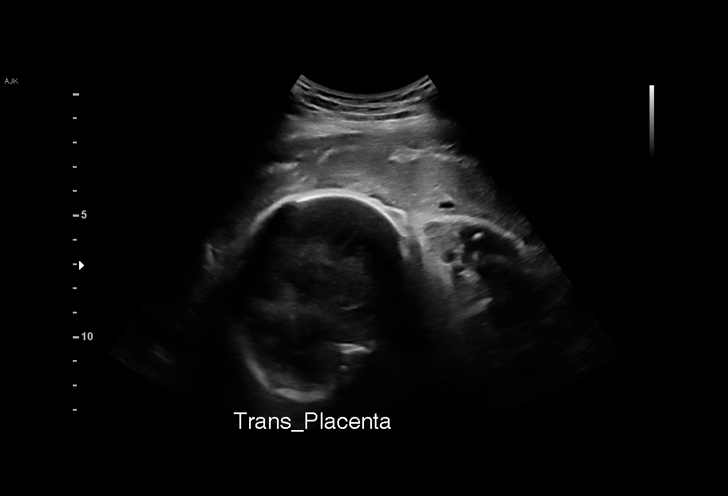
[im 45/58]
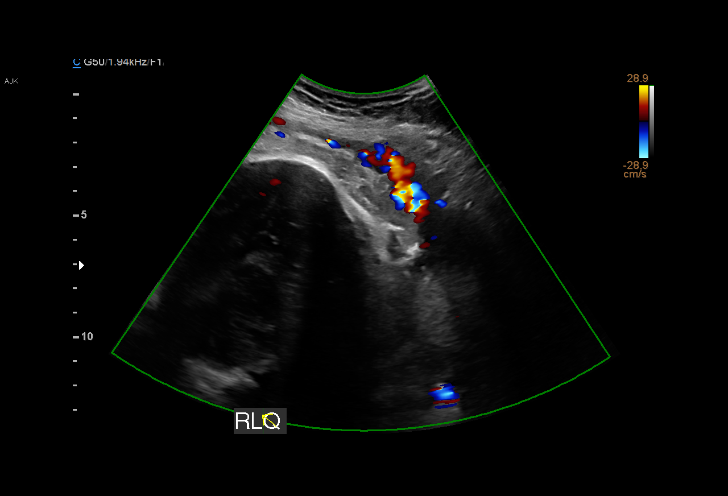
[im 49/58]
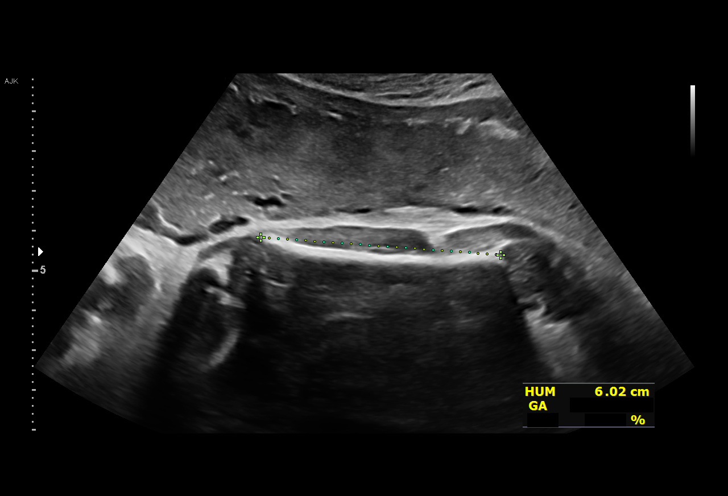
[im 53/58]
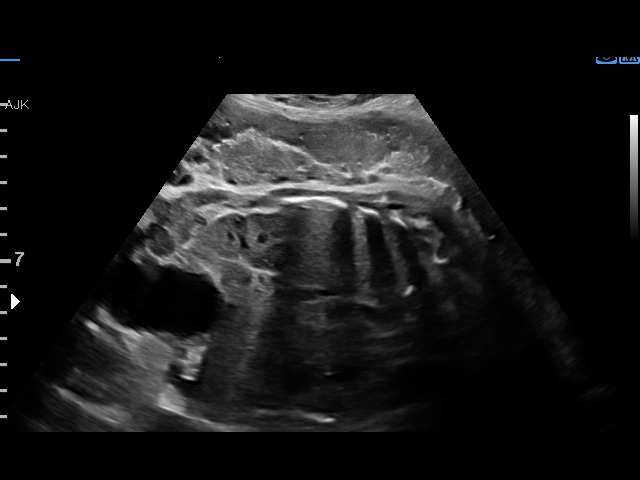
[im 58/58]
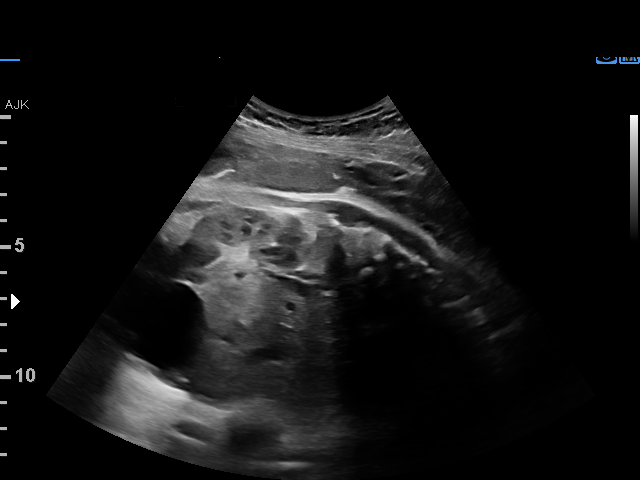

[14 of 28 positions shown; findings below may reference images not displayed]

1  US MFM OB DETAIL +14 WK               76811.01    LARICSA

Indications

 Premature rupture of membranes - leaking       [J7]
 fluid
 34 weeks gestation of pregnancy
 Encounter for antenatal screening for          [J7]
 malformations
Fetal Evaluation

 Num Of Fetuses:         1
 Fetal Heart Rate(bpm):  143
 Cardiac Activity:       Observed
 Presentation:           Cephalic oblique
 Placenta:               Anterior
 P. Cord Insertion:      Visualized

 Amniotic Fluid
 AFI FV:      Oligohydramnios

 AFI Sum(cm)     %Tile       Largest Pocket(cm)
 2.5             < 3

 RUQ(cm)

Biometry

 BPD:      89.5  mm     G. Age:  36w 2d         86  %    CI:        77.81   %    70 - 86
                                                         FL/HC:      20.8   %    20.1 -
 HC:      321.1  mm     G. Age:  36w 2d         50  %    HC/AC:      0.96        0.93 -
 AC:      333.1  mm     G. Age:  37w 2d         97  %    FL/BPD:     74.6   %    71 - 87
 FL:       66.8  mm     G. Age:  34w 3d         29  %    FL/AC:      20.1   %    20 - 24
 HUM:      60.2  mm     G. Age:  35w 0d         65  %
 Est. FW:    [J7]  gm      6 lb 6 oz     85  %
OB History

 Gravidity:    3         Term:   2
Gestational Age

 Clinical EDD:  34w 6d                                        EDD:   [DATE]
 U/S Today:     36w 1d                                        EDD:   [DATE]
 Best:          34w 6d     Det. By:  Clinical EDD             EDD:   [DATE]
Anatomy

 Cranium:               Appears normal         LVOT:                   Not well visualized
 Cavum:                 Not well visualized    Aortic Arch:            Not well visualized
 Ventricles:            Not well visualized    Ductal Arch:            Not well visualized
 Choroid Plexus:        Appears normal         Diaphragm:              Not well visualized
 Cerebellum:            Not well visualized    Stomach:                Appears normal, left
                                                                       sided
 Posterior Fossa:       Not well visualized    Abdomen:                Appears normal
 Nuchal Fold:           Not applicable (>20    Abdominal Wall:         Not well visualized
                        wks GA)
 Face:                  Not well visualized    Cord Vessels:           Appears normal (3
                                                                       vessel cord)
 Lips:                  Not well visualized    Kidneys:                Appear normal
 Palate:                Not well visualized    Bladder:                Appears normal
 Thoracic:              Appears normal         Spine:                  Ltd views no
                                                                       intracranial signs of
                                                                       NTD
 Heart:                 Not well visualized    Upper Extremities:      Not well visualized
 RVOT:                  Not well visualized    Lower Extremities:      Not well visualized

 Other:  Technically difficult due to low amniotic fluid. Technically difficult due
         to fetal position.
Comments

 This patient has been hospitalized due to PPROM.  She has
 elected to proceed with expectant management until 37
 weeks.
 The fetal growth appears appropriate for her gestational age.
 Oligohydramnios with a total AFI of 2.5 cm is noted.
 The views of the fetal anatomy were limited today due to
 oligohydramnios and her advanced gestational age.
 She should continue daily fetal testing until delivery.

## 2021-01-30 MED ORDER — LACTATED RINGERS IV BOLUS
500.0000 mL | Freq: Once | INTRAVENOUS | Status: AC
  Administered 2021-01-30: 500 mL via INTRAVENOUS

## 2021-01-30 NOTE — Progress Notes (Signed)
Patient ID: Jenna Cross, female   DOB: 09-21-1986, 35 y.o.   MRN: [id number] Jenna Cross is great spirits this morning Just came back from an [place] that she states was reassuring (results not on chart yet) GFM No ctxs Still has gushes of clear fluid with activity  VSSAF Labs stable (mild anemia and slight increase WBC (had BMZ series)  FHR 130s with accels no decels No ctxs  Abd Gravid nt Neg homans bil  IUP at 34 6/7  PPROM - No sxs of chorio.  Plan per Dr Jenna Cross discussion: counseled regarding risks of expectant management - After careful consideration, she elects latency antibiotics and expectant management until 75 wga. - We discussed tocolysis is not typically indicated as it can be associated with a higher risk of chorioamnionitis.  - BMZ for FLM completed - Latency abxas above - CBC q 72 hours   GBS Positive - Abx in labor  Fetal well being -  S/P BMZ series NST q shift NICU consult complete and appreciated Valtrex daily - no sxs of HSV  DL

## 2021-01-30 NOTE — Progress Notes (Signed)
Pt was on EFM when RN came on shift due to complain of increased pressure and some discomfort. Pt  Was anxious with some elevated HR but b/p was within normal limits. Had some mild contractions 5 to 10 mins apart  and eventually spaced out  And said she felt better. Around 2100 Pt called that she felt hot in the face and was a bit anxious. Temp was 98.4 at that time.  Md was called and given updates on pt and new orders were given. Will continue to monitor pt closely

## 2021-01-30 NOTE — Progress Notes (Signed)
Pt called out c/o increasing pressure around lower abdomen and back. Pt placed on EFM. Will make oncoming RN aware and will reassess.

## 2021-01-31 ENCOUNTER — Inpatient Hospital Stay (HOSPITAL_COMMUNITY): Payer: BC Managed Care – PPO | Admitting: Anesthesiology

## 2021-01-31 ENCOUNTER — Encounter (HOSPITAL_COMMUNITY): Payer: Self-pay | Admitting: Obstetrics and Gynecology

## 2021-01-31 ENCOUNTER — Encounter (HOSPITAL_COMMUNITY)
Admission: AD | Disposition: A | Discharge status: Home / Self Care | Payer: Self-pay | Source: Home / Self Care | Attending: Obstetrics and Gynecology

## 2021-01-31 DIAGNOSIS — Z8759 Personal history of other complications of pregnancy, childbirth and the puerperium: Secondary | ICD-10-CM

## 2021-01-31 LAB — RPR: RPR Ser Ql: NONREACTIVE

## 2021-01-31 LAB — TYPE AND SCREEN
ABO/RH(D): O POS
Antibody Screen: NEGATIVE

## 2021-01-31 LAB — CBC
HCT: 32.4 % — ABNORMAL LOW (ref 36.0–46.0)
Hemoglobin: 10.7 g/dL — ABNORMAL LOW (ref 12.0–15.0)
MCH: 31.4 pg (ref 26.0–34.0)
MCHC: 33 g/dL (ref 30.0–36.0)
MCV: 95 fL (ref 80.0–100.0)
Platelets: 258 10*3/uL (ref 150–400)
RBC: 3.41 MIL/uL — ABNORMAL LOW (ref 3.87–5.11)
RDW: 13.3 % (ref 11.5–15.5)
WBC: 32.7 10*3/uL — ABNORMAL HIGH (ref 4.0–10.5)
nRBC: 0.1 % (ref 0.0–0.2)

## 2021-01-31 SURGERY — Surgical Case
Anesthesia: Epidural

## 2021-01-31 MED ORDER — SCOPOLAMINE 1 MG/3DAYS TD PT72
MEDICATED_PATCH | TRANSDERMAL | Status: DC | PRN
  Administered 2021-01-31: 1 via TRANSDERMAL

## 2021-01-31 MED ORDER — MORPHINE SULFATE (PF) 0.5 MG/ML IJ SOLN
INTRAMUSCULAR | Status: AC
  Filled 2021-01-31: qty 10

## 2021-01-31 MED ORDER — NALBUPHINE HCL 10 MG/ML IJ SOLN: 5.0000 mg | INTRAMUSCULAR | Status: DC | PRN

## 2021-01-31 MED ORDER — KETOROLAC TROMETHAMINE 30 MG/ML IJ SOLN: 30.0000 mg | Freq: Four times a day (QID) | INTRAMUSCULAR | Status: AC | PRN

## 2021-01-31 MED ORDER — OXYTOCIN-SODIUM CHLORIDE 30-0.9 UT/500ML-% IV SOLN: 1.0000 m[IU]/min | INTRAVENOUS | Status: DC

## 2021-01-31 MED ORDER — ZOLPIDEM TARTRATE 5 MG PO TABS: 5.0000 mg | ORAL_TABLET | Freq: Every evening | ORAL | Status: DC | PRN

## 2021-01-31 MED ORDER — LIDOCAINE HCL (PF) 1 % IJ SOLN: 30.0000 mL | INTRAMUSCULAR | Status: DC | PRN

## 2021-01-31 MED ORDER — NITROGLYCERIN 0.4 MG/SPRAY TL SOLN
Status: AC
  Filled 2021-01-31: qty 4.9

## 2021-01-31 MED ORDER — STERILE WATER FOR IRRIGATION IR SOLN
Status: DC | PRN
  Administered 2021-01-31: 1000 mL

## 2021-01-31 MED ORDER — NALBUPHINE HCL 10 MG/ML IJ SOLN: 5.0000 mg | Freq: Once | INTRAMUSCULAR | Status: DC | PRN

## 2021-01-31 MED ORDER — LACTATED RINGERS IV SOLN: INTRAVENOUS | Status: DC

## 2021-01-31 MED ORDER — MEPERIDINE HCL 25 MG/ML IJ SOLN
INTRAMUSCULAR | Status: AC
  Filled 2021-01-31: qty 1

## 2021-01-31 MED ORDER — DEXMEDETOMIDINE (PRECEDEX) IN NS 20 MCG/5ML (4 MCG/ML) IV SYRINGE
PREFILLED_SYRINGE | INTRAVENOUS | Status: AC
  Filled 2021-01-31: qty 5

## 2021-01-31 MED ORDER — EPHEDRINE 5 MG/ML INJ: 10.0000 mg | INTRAVENOUS | Status: DC | PRN

## 2021-01-31 MED ORDER — PHENYLEPHRINE 40 MCG/ML (10ML) SYRINGE FOR IV PUSH (FOR BLOOD PRESSURE SUPPORT)
PREFILLED_SYRINGE | INTRAVENOUS | Status: AC
  Filled 2021-01-31: qty 10

## 2021-01-31 MED ORDER — PHENYLEPHRINE 40 MCG/ML (10ML) SYRINGE FOR IV PUSH (FOR BLOOD PRESSURE SUPPORT): 80.0000 ug | PREFILLED_SYRINGE | INTRAVENOUS | Status: DC | PRN

## 2021-01-31 MED ORDER — DIPHENHYDRAMINE HCL 50 MG/ML IJ SOLN: 12.5000 mg | INTRAMUSCULAR | Status: DC | PRN

## 2021-01-31 MED ORDER — KETOROLAC TROMETHAMINE 30 MG/ML IJ SOLN
INTRAMUSCULAR | Status: AC
  Filled 2021-01-31: qty 1

## 2021-01-31 MED ORDER — MEPERIDINE HCL 25 MG/ML IJ SOLN
INTRAMUSCULAR | Status: DC | PRN
  Administered 2021-01-31: 12.5 mg via INTRAVENOUS

## 2021-01-31 MED ORDER — ZOLPIDEM TARTRATE 5 MG PO TABS
5.0000 mg | ORAL_TABLET | Freq: Every evening | ORAL | Status: DC | PRN
  Administered 2021-01-31 – 2021-02-01 (×2): 5 mg via ORAL
  Filled 2021-01-31 (×2): qty 1

## 2021-01-31 MED ORDER — ACETAMINOPHEN 325 MG PO TABS: 650.0000 mg | ORAL_TABLET | ORAL | Status: DC | PRN

## 2021-01-31 MED ORDER — ACETAMINOPHEN 10 MG/ML IV SOLN
1000.0000 mg | Freq: Four times a day (QID) | INTRAVENOUS | Status: AC
  Administered 2021-01-31 – 2021-02-01 (×4): 1000 mg via INTRAVENOUS
  Filled 2021-01-31 (×5): qty 100

## 2021-01-31 MED ORDER — OXYCODONE HCL 5 MG PO TABS
5.0000 mg | ORAL_TABLET | ORAL | Status: DC | PRN
  Administered 2021-02-01 – 2021-02-02 (×3): 5 mg via ORAL
  Administered 2021-02-02: 10 mg via ORAL
  Filled 2021-01-31 (×2): qty 1
  Filled 2021-01-31: qty 2
  Filled 2021-01-31: qty 1

## 2021-01-31 MED ORDER — DEXMEDETOMIDINE (PRECEDEX) IN NS 20 MCG/5ML (4 MCG/ML) IV SYRINGE
PREFILLED_SYRINGE | INTRAVENOUS | Status: DC | PRN
  Administered 2021-01-31: 8 ug via INTRAVENOUS
  Administered 2021-01-31: 4 ug via INTRAVENOUS

## 2021-01-31 MED ORDER — SENNOSIDES-DOCUSATE SODIUM 8.6-50 MG PO TABS
2.0000 | ORAL_TABLET | Freq: Every day | ORAL | Status: DC
  Administered 2021-02-01 – 2021-02-03 (×3): 2 via ORAL
  Filled 2021-01-31 (×3): qty 2

## 2021-01-31 MED ORDER — MENTHOL 3 MG MT LOZG: 1.0000 | LOZENGE | OROMUCOSAL | Status: DC | PRN

## 2021-01-31 MED ORDER — DIPHENHYDRAMINE HCL 25 MG PO CAPS: 25.0000 mg | ORAL_CAPSULE | ORAL | Status: DC | PRN

## 2021-01-31 MED ORDER — DIPHENHYDRAMINE HCL 25 MG PO CAPS: 25.0000 mg | ORAL_CAPSULE | Freq: Four times a day (QID) | ORAL | Status: DC | PRN

## 2021-01-31 MED ORDER — DEXAMETHASONE SODIUM PHOSPHATE 4 MG/ML IJ SOLN
INTRAMUSCULAR | Status: DC | PRN
  Administered 2021-01-31: 5 mg via INTRAVENOUS

## 2021-01-31 MED ORDER — PROMETHAZINE HCL 25 MG/ML IJ SOLN
INTRAMUSCULAR | Status: AC
  Filled 2021-01-31: qty 1

## 2021-01-31 MED ORDER — KETOROLAC TROMETHAMINE 30 MG/ML IJ SOLN
30.0000 mg | Freq: Four times a day (QID) | INTRAMUSCULAR | Status: AC | PRN
  Administered 2021-01-31 (×3): 30 mg via INTRAVENOUS
  Filled 2021-01-31 (×2): qty 1

## 2021-01-31 MED ORDER — PROMETHAZINE HCL 25 MG/ML IJ SOLN
6.2500 mg | INTRAMUSCULAR | Status: DC | PRN
  Administered 2021-01-31: 12.5 mg via INTRAVENOUS

## 2021-01-31 MED ORDER — OXYTOCIN-SODIUM CHLORIDE 30-0.9 UT/500ML-% IV SOLN
INTRAVENOUS | Status: AC
  Filled 2021-01-31: qty 500

## 2021-01-31 MED ORDER — METOCLOPRAMIDE HCL 5 MG/ML IJ SOLN
INTRAMUSCULAR | Status: DC | PRN
  Administered 2021-01-31: 5 mg via INTRAVENOUS

## 2021-01-31 MED ORDER — SIMETHICONE 80 MG PO CHEW: 80.0000 mg | CHEWABLE_TABLET | ORAL | Status: DC | PRN

## 2021-01-31 MED ORDER — ONDANSETRON HCL 4 MG/2ML IJ SOLN
INTRAMUSCULAR | Status: DC | PRN
  Administered 2021-01-31: 4 mg via INTRAVENOUS

## 2021-01-31 MED ORDER — MORPHINE SULFATE (PF) 0.5 MG/ML IJ SOLN
INTRAMUSCULAR | Status: DC | PRN
  Administered 2021-01-31: 3 mg via EPIDURAL

## 2021-01-31 MED ORDER — ONDANSETRON HCL 4 MG/2ML IJ SOLN: 4.0000 mg | Freq: Four times a day (QID) | INTRAMUSCULAR | Status: DC | PRN

## 2021-01-31 MED ORDER — OXYTOCIN-SODIUM CHLORIDE 30-0.9 UT/500ML-% IV SOLN: 2.5000 [IU]/h | INTRAVENOUS | Status: AC

## 2021-01-31 MED ORDER — OXYCODONE-ACETAMINOPHEN 5-325 MG PO TABS
2.0000 | ORAL_TABLET | ORAL | Status: DC | PRN
Start: 2021-01-31 — End: 2021-01-31

## 2021-01-31 MED ORDER — SODIUM CHLORIDE 0.9 % IV SOLN
3.0000 g | Freq: Four times a day (QID) | INTRAVENOUS | Status: DC
  Administered 2021-01-31 – 2021-02-02 (×10): 3 g via INTRAVENOUS
  Filled 2021-01-31 (×2): qty 8
  Filled 2021-01-31 (×2): qty 3
  Filled 2021-01-31 (×9): qty 8

## 2021-01-31 MED ORDER — OXYCODONE HCL 5 MG/5ML PO SOLN: 5.0000 mg | Freq: Once | ORAL | Status: DC | PRN

## 2021-01-31 MED ORDER — OXYCODONE-ACETAMINOPHEN 5-325 MG PO TABS: 1.0000 | ORAL_TABLET | ORAL | Status: DC | PRN

## 2021-01-31 MED ORDER — MEPERIDINE HCL 25 MG/ML IJ SOLN
6.2500 mg | INTRAMUSCULAR | Status: DC | PRN
  Administered 2021-01-31: 6.25 mg via INTRAVENOUS

## 2021-01-31 MED ORDER — SIMETHICONE 80 MG PO CHEW
80.0000 mg | CHEWABLE_TABLET | Freq: Three times a day (TID) | ORAL | Status: DC
  Administered 2021-01-31 – 2021-02-02 (×8): 80 mg via ORAL
  Filled 2021-01-31 (×9): qty 1

## 2021-01-31 MED ORDER — OXYTOCIN-SODIUM CHLORIDE 30-0.9 UT/500ML-% IV SOLN
INTRAVENOUS | Status: DC | PRN
  Administered 2021-01-31 (×2): 150 mL via INTRAVENOUS

## 2021-01-31 MED ORDER — PHENYLEPHRINE 40 MCG/ML (10ML) SYRINGE FOR IV PUSH (FOR BLOOD PRESSURE SUPPORT)
80.0000 ug | PREFILLED_SYRINGE | INTRAVENOUS | Status: DC | PRN
  Filled 2021-01-31: qty 10

## 2021-01-31 MED ORDER — TERBUTALINE SULFATE 1 MG/ML IJ SOLN: 0.2500 mg | Freq: Once | INTRAMUSCULAR | Status: DC | PRN

## 2021-01-31 MED ORDER — GENTAMICIN SULFATE 40 MG/ML IJ SOLN
5.0000 mg/kg | INTRAVENOUS | Status: DC
  Administered 2021-01-31: 330 mg via INTRAVENOUS
  Filled 2021-01-31: qty 8.25

## 2021-01-31 MED ORDER — SOD CITRATE-CITRIC ACID 500-334 MG/5ML PO SOLN
30.0000 mL | ORAL | Status: DC | PRN
  Administered 2021-01-31: 30 mL via ORAL
  Filled 2021-01-31: qty 15

## 2021-01-31 MED ORDER — COCONUT OIL OIL
1.0000 "application " | TOPICAL_OIL | Status: DC | PRN
  Administered 2021-01-31: 1 via TOPICAL

## 2021-01-31 MED ORDER — ACETAMINOPHEN 10 MG/ML IV SOLN
INTRAVENOUS | Status: AC
  Filled 2021-01-31: qty 100

## 2021-01-31 MED ORDER — OXYTOCIN BOLUS FROM INFUSION: 333.0000 mL | Freq: Once | INTRAVENOUS | Status: DC

## 2021-01-31 MED ORDER — SCOPOLAMINE 1 MG/3DAYS TD PT72
MEDICATED_PATCH | TRANSDERMAL | Status: AC
  Filled 2021-01-31: qty 1

## 2021-01-31 MED ORDER — FENTANYL CITRATE (PF) 100 MCG/2ML IJ SOLN: 25.0000 ug | INTRAMUSCULAR | Status: DC | PRN

## 2021-01-31 MED ORDER — NALOXONE HCL 4 MG/10ML IJ SOLN
1.0000 ug/kg/h | INTRAVENOUS | Status: DC | PRN
  Filled 2021-01-31: qty 5

## 2021-01-31 MED ORDER — ACETAMINOPHEN 500 MG PO TABS: 1000.0000 mg | ORAL_TABLET | Freq: Four times a day (QID) | ORAL | Status: DC

## 2021-01-31 MED ORDER — ONDANSETRON HCL 4 MG/2ML IJ SOLN: 4.0000 mg | Freq: Three times a day (TID) | INTRAMUSCULAR | Status: DC | PRN

## 2021-01-31 MED ORDER — LACTATED RINGERS IV SOLN
500.0000 mL | Freq: Once | INTRAVENOUS | Status: AC
Start: 2021-01-31 — End: 2021-01-31

## 2021-01-31 MED ORDER — NALOXONE HCL 0.4 MG/ML IJ SOLN: 0.4000 mg | INTRAMUSCULAR | Status: DC | PRN

## 2021-01-31 MED ORDER — OXYTOCIN-SODIUM CHLORIDE 30-0.9 UT/500ML-% IV SOLN
2.5000 [IU]/h | INTRAVENOUS | Status: DC
  Administered 2021-01-31: 2.5 [IU]/h via INTRAVENOUS

## 2021-01-31 MED ORDER — IBUPROFEN 600 MG PO TABS
600.0000 mg | ORAL_TABLET | Freq: Four times a day (QID) | ORAL | Status: DC
  Administered 2021-02-01 – 2021-02-03 (×9): 600 mg via ORAL
  Filled 2021-01-31 (×9): qty 1

## 2021-01-31 MED ORDER — LACTATED RINGERS IV SOLN: 500.0000 mL | INTRAVENOUS | Status: DC | PRN

## 2021-01-31 MED ORDER — FENTANYL-BUPIVACAINE-NACL 0.5-0.125-0.9 MG/250ML-% EP SOLN
12.0000 mL/h | EPIDURAL | Status: DC | PRN
  Administered 2021-01-31: 12 mL/h via EPIDURAL
  Filled 2021-01-31: qty 250

## 2021-01-31 MED ORDER — ONDANSETRON HCL 4 MG/2ML IJ SOLN
INTRAMUSCULAR | Status: AC
  Filled 2021-01-31: qty 2

## 2021-01-31 MED ORDER — OXYCODONE HCL 5 MG PO TABS: 5.0000 mg | ORAL_TABLET | Freq: Once | ORAL | Status: DC | PRN

## 2021-01-31 MED ORDER — SODIUM CHLORIDE 0.9 % IR SOLN
Status: DC | PRN
  Administered 2021-01-31: 1

## 2021-01-31 MED ORDER — TETANUS-DIPHTH-ACELL PERTUSSIS 5-2.5-18.5 LF-MCG/0.5 IM SUSY: 0.5000 mL | PREFILLED_SYRINGE | Freq: Once | INTRAMUSCULAR | Status: DC

## 2021-01-31 MED ORDER — METOCLOPRAMIDE HCL 5 MG/ML IJ SOLN
INTRAMUSCULAR | Status: AC
  Filled 2021-01-31: qty 2

## 2021-01-31 MED ORDER — SODIUM CHLORIDE 0.9% FLUSH: 3.0000 mL | INTRAVENOUS | Status: DC | PRN

## 2021-01-31 MED ORDER — SODIUM CHLORIDE 0.9 % IV SOLN
1.0000 g | Freq: Four times a day (QID) | INTRAVENOUS | Status: DC
  Administered 2021-01-31: 1 g via INTRAVENOUS
  Filled 2021-01-31 (×2): qty 1000

## 2021-01-31 MED ORDER — NITROGLYCERIN 0.4 MG/SPRAY TL SOLN
Status: DC | PRN
  Administered 2021-01-31: 2 via SUBLINGUAL

## 2021-01-31 MED ORDER — PRENATAL MULTIVITAMIN CH
1.0000 | ORAL_TABLET | Freq: Every day | ORAL | Status: DC
  Administered 2021-01-31 – 2021-02-02 (×3): 1 via ORAL
  Filled 2021-01-31 (×3): qty 1

## 2021-01-31 MED ORDER — NALBUPHINE HCL 10 MG/ML IJ SOLN
5.0000 mg | INTRAMUSCULAR | Status: DC | PRN
Start: 2021-01-31 — End: 2021-02-03

## 2021-01-31 MED ORDER — LIDOCAINE-EPINEPHRINE (PF) 2 %-1:200000 IJ SOLN
INTRAMUSCULAR | Status: DC | PRN
  Administered 2021-01-31: 10 mL via EPIDURAL
  Administered 2021-01-31 (×2): 5 mL via EPIDURAL

## 2021-01-31 MED ORDER — DEXAMETHASONE SODIUM PHOSPHATE 10 MG/ML IJ SOLN
INTRAMUSCULAR | Status: AC
  Filled 2021-01-31: qty 1

## 2021-01-31 MED ORDER — DOCUSATE SODIUM 100 MG PO CAPS
100.0000 mg | ORAL_CAPSULE | Freq: Two times a day (BID) | ORAL | Status: DC
  Administered 2021-01-31 – 2021-02-03 (×6): 100 mg via ORAL
  Filled 2021-01-31 (×6): qty 1

## 2021-01-31 MED ORDER — LIDOCAINE HCL (PF) 1 % IJ SOLN
INTRAMUSCULAR | Status: DC | PRN
  Administered 2021-01-31 (×2): 4 mL via EPIDURAL

## 2021-01-31 SURGICAL SUPPLY — 36 items
APL SKNCLS STERI-STRIP NONHPOA (GAUZE/BANDAGES/DRESSINGS) ×1
BARRIER ADHS 3X4 INTERCEED (GAUZE/BANDAGES/DRESSINGS) IMPLANT
BENZOIN TINCTURE PRP APPL 2/3 (GAUZE/BANDAGES/DRESSINGS) ×1 IMPLANT
BRR ADH 4X3 ABS CNTRL BYND (GAUZE/BANDAGES/DRESSINGS)
CHLORAPREP W/TINT 26ML (MISCELLANEOUS) ×2 IMPLANT
CLAMP CORD UMBIL (MISCELLANEOUS) IMPLANT
CLOSURE STERI STRIP 1/2 X4 (GAUZE/BANDAGES/DRESSINGS) ×1 IMPLANT
CLOTH BEACON ORANGE TIMEOUT ST (SAFETY) ×2 IMPLANT
DRSG OPSITE POSTOP 4X10 (GAUZE/BANDAGES/DRESSINGS) ×2 IMPLANT
ELECT REM PT RETURN 9FT ADLT (ELECTROSURGICAL) ×2
ELECTRODE REM PT RTRN 9FT ADLT (ELECTROSURGICAL) ×1 IMPLANT
EXTRACTOR VACUUM M CUP 4 TUBE (SUCTIONS) IMPLANT
GLOVE BIO SURGEON STRL SZ 6.5 (GLOVE) ×2 IMPLANT
GLOVE BIOGEL PI IND STRL 7.0 (GLOVE) ×1 IMPLANT
GLOVE BIOGEL PI INDICATOR 7.0 (GLOVE) ×1
GOWN STRL REUS W/TWL LRG LVL3 (GOWN DISPOSABLE) ×4 IMPLANT
HEMOSTAT ARISTA ABSORB 3G PWDR (HEMOSTASIS) ×1 IMPLANT
KIT ABG SYR 3ML LUER SLIP (SYRINGE) IMPLANT
NDL HYPO 25X5/8 SAFETYGLIDE (NEEDLE) ×1 IMPLANT
NEEDLE HYPO 22GX1.5 SAFETY (NEEDLE) IMPLANT
NEEDLE HYPO 25X5/8 SAFETYGLIDE (NEEDLE) ×2 IMPLANT
NS IRRIG 1000ML POUR BTL (IV SOLUTION) ×2 IMPLANT
PACK C SECTION WH (CUSTOM PROCEDURE TRAY) ×2 IMPLANT
PAD OB MATERNITY 4.3X12.25 (PERSONAL CARE ITEMS) ×2 IMPLANT
PENCIL SMOKE EVAC W/HOLSTER (ELECTROSURGICAL) ×2 IMPLANT
SUT CHROMIC 0 CTX 36 (SUTURE) ×8 IMPLANT
SUT PLAIN 0 NONE (SUTURE) IMPLANT
SUT PLAIN 2 0 XLH (SUTURE) IMPLANT
SUT VIC AB 0 CT1 27 (SUTURE) ×6
SUT VIC AB 0 CT1 27XBRD ANBCTR (SUTURE) ×3 IMPLANT
SUT VIC AB 4-0 KS 27 (SUTURE) IMPLANT
SYR CONTROL 10ML LL (SYRINGE) IMPLANT
TOWEL OR 17X24 6PK STRL BLUE (TOWEL DISPOSABLE) ×2 IMPLANT
TRAY FOLEY W/BAG SLVR 14FR LF (SET/KITS/TRAYS/PACK) ×2 IMPLANT
VACUUM CUP M-STYLE MYSTIC II (SUCTIONS) IMPLANT
WATER STERILE IRR 1000ML POUR (IV SOLUTION) ×2 IMPLANT

## 2021-01-31 NOTE — Brief Op Note (Signed)
01/31/2021  10:17 AM  PATIENT:  Jenna Cross  35 y.o. female  PRE-OPERATIVE DIAGNOSIS:   IUP at 2 w  PPROM Chorioamnionitis Malpresentation  POST-OPERATIVE DIAGNOSIS:   Same  PROCEDURE:  Procedure(s): CESAREAN SECTION (N/A)  Primary C Section with T shaped incision   SURGEON:  Surgeon(s) and Role:    * Dian Queen, MD - Primary  PHYSICIAN ASSISTANT:   ASSISTANTS: none   ANESTHESIA:  Epidural  EBL:  Per anesthesia record   BLOOD ADMINISTERED:none  DRAINS: Urinary Catheter (Foley)   LOCAL MEDICATIONS USED:  NONE  SPECIMEN:  Source of Specimen:  placenta  DISPOSITION OF SPECIMEN:  PATHOLOGY  COUNTS:  YES  TOURNIQUET:  * No tourniquets in log *  DICTATION: .Other Dictation: Dictation Number dictated  PLAN OF CARE: Admit to inpatient   PATIENT DISPOSITION:  PACU - hemodynamically stable.   Delay start of Pharmacological VTE agent (>24hrs) due to surgical blood loss or risk of bleeding: not applicable

## 2021-01-31 NOTE — Lactation Note (Signed)
This note was copied from a baby's chart. Lactation Consultation Note  Patient Name: Jenna Cross NWGNF'A Date: 01/31/2021 Reason for consult: Follow-up assessment;Late-preterm 34-36.6wks;NICU baby Age:36 hours  Visited with mom of 45 hours old LPI NICU female, she's a P2 and have some experience BF her first baby, who is now 72 months old. She also reported (+) breast changes during the pregnancy. OB Specialty care RN had already set up a pump and mom started pumping a few hours ago, praised her for her efforts.  LC assisted with hand expression, she was able to get droplets of colostrum. Reviewed lactogenesis II, pumping schedule, benefits of breastmilk for NICU baby and oral care.  Feeding plan:  1. Encouraged mom to pump every 2-3 hours during the day, ideally 8 pumping sessions/24 hours 2. Breast massage and hand expression were also encouraged  BF brochure, BF resources and NICU booklet were reviewed. FOB present and supportive. Parents reported all questions and concerns were answered, they're both aware of [facility] OP services and will call PRN.  Maternal Data Has patient been taught Hand Expression?: Yes Does the patient have breastfeeding experience prior to this delivery?: Yes How long did the patient breastfeed?: 6 weeks  Feeding Mother's Current Feeding Choice: Breast Milk  LATCH Score    Lactation Tools Discussed/Used Tools: Pump;Flanges;Coconut oil Flange Size: 24 Breast pump type: Double-Electric Breast Pump Pump Education: Setup, frequency, and cleaning;Milk Storage Reason for Pumping: LPI in NICU Pumping frequency: q 3 hours Pumped volume:  (drops)  Interventions Interventions: Breast feeding basics reviewed;DEBP;Coconut oil;Hand express;Breast massage;Education  Discharge Pump: DEBP;Personal (Spectra DEBP at home) [facility] Program: No  Consult Status Consult Status: Follow-up Date: 02/01/21 Follow-up type: In-patient    Jenna Cross 01/31/2021, 5:51  PM

## 2021-01-31 NOTE — Anesthesia Postprocedure Evaluation (Signed)
Anesthesia Post Note  Patient: Jenna Cross  Procedure(s) Performed: CESAREAN SECTION (N/A )     Patient location during evaluation: Mother Baby Anesthesia Type: Epidural Level of consciousness: awake and alert Pain management: pain level controlled Vital Signs Assessment: post-procedure vital signs reviewed and stable Respiratory status: spontaneous breathing, nonlabored ventilation and respiratory function stable Cardiovascular status: stable Postop Assessment: no headache, no backache and epidural receding Anesthetic complications: no   No complications documented.  Last Vitals:  Vitals:   01/31/21 1115 01/31/21 1140  BP: (!) 126/105 (!) 118/49  Pulse: (!) 129 (!) 111  Resp: (!) 24 20  Temp: (!) 39.2 C   SpO2: 98% 97%    Last Pain:  Vitals:   01/31/21 1115  TempSrc: Axillary  PainSc: 1    Pain Goal: Patients Stated Pain Goal: 3 (01/31/21 0019)              Epidural/Spinal Function Cutaneous sensation: Able to Wiggle Toes (01/31/21 1115), Patient able to flex knees: Yes (01/31/21 1115), Patient able to lift hips off bed: No (01/31/21 1115), Back pain beyond tenderness at insertion site: No (01/31/21 1115), Progressively worsening motor and/or sensory loss: No (01/31/21 1115), Bowel and/or bladder incontinence post epidural: No (01/31/21 1115)  Merlinda Frederick

## 2021-01-31 NOTE — Lactation Note (Signed)
This note was copied from a baby's chart. Lactation Consultation Note  Patient Name: Jenna Cross WGYKZ'L Date: 01/31/2021 Reason for consult: Initial assessment;Late-preterm 34-36.6wks Age:36 hours   LC Initial Visit:  Attempted to visit with family in [place], however, parents were in the NICU.  Visited briefly in NICU and mother is interested in having me return to her room after the NICU visit.  The RN has set up her DEBP.  I will review basic information whe she calls for my assistance.  Mother was holding her son STS when I left the room.     Maternal Data    Feeding Mother's Current Feeding Choice: Breast Milk  LATCH Score                    Lactation Tools Discussed/Used    Interventions    Discharge    Consult Status Consult Status: Follow-up Date: 01/31/21 Follow-up type: In-patient    [name] 01/31/2021, 3:27 PM

## 2021-01-31 NOTE — Plan of Care (Signed)
  Problem: Activity: Goal: Risk for activity intolerance will decrease Outcome: Progressing   Problem: Coping: Goal: Level of anxiety will decrease Outcome: Progressing   Problem: Pain Managment: Goal: General experience of comfort will improve Outcome: Progressing   Problem: Clinical Measurements: Goal: Complications related to the disease process, condition or treatment will be avoided or minimized Outcome: Progressing   Problem: Clinical Measurements: Goal: Ability to maintain clinical measurements within normal limits will improve Outcome: Progressing Goal: Postoperative complications will be avoided or minimized Outcome: Progressing   Problem: Education: Goal: Knowledge of condition will improve Outcome: Progressing

## 2021-01-31 NOTE — Anesthesia Preprocedure Evaluation (Addendum)
Anesthesia Evaluation  Patient identified by MRN, date of birth, ID band Patient awake    Reviewed: Allergy & Precautions, Patient's Chart, lab work & pertinent test results  History of Anesthesia Complications Negative for: history of anesthetic complications  Airway Mallampati: II  TM Distance: >3 FB Neck ROM: Full    Dental  (+) Dental Advisory Given   Pulmonary former smoker,  covid pending   breath sounds clear to auscultation       Cardiovascular negative cardio ROS   Rhythm:Regular     Neuro/Psych PSYCHIATRIC DISORDERS Anxiety negative neurological ROS     GI/Hepatic negative GI ROS, Neg liver ROS,   Endo/Other  negative endocrine ROS  Renal/GU negative Renal ROS     Musculoskeletal negative musculoskeletal ROS (+)   Abdominal   Peds  Hematology negative hematology ROS (+)   Anesthesia Other Findings   Reproductive/Obstetrics (+) Pregnancy                             Anesthesia Physical  Anesthesia Plan  ASA: II  Anesthesia Plan: Epidural   Post-op Pain Management:    Induction:   PONV Risk Score and Plan: 2 and Treatment may vary due to age or medical condition  Airway Management Planned: Natural Airway  Additional Equipment:   Intra-op Plan:   Post-operative Plan:   Informed Consent: I have reviewed the patients History and Physical, chart, labs and discussed the procedure including the risks, benefits and alternatives for the proposed anesthesia with the patient or authorized representative who has indicated his/her understanding and acceptance.       Plan Discussed with: Anesthesiologist  Anesthesia Plan Comments: (Urgent C-section called by Dr. Helane Rima for breech positioning. Epidural working well. Will plan to dose for surgical anesthesia. GETA/RSI as backup plan as discussed with patient. She is agreeable to the plan. Norton Blizzard, MD  )        Anesthesia Quick Evaluation

## 2021-01-31 NOTE — Transfer of Care (Signed)
Immediate Anesthesia Transfer of Care Note  Patient: Jenna Cross  Procedure(s) Performed: CESAREAN SECTION (N/A )  Patient Location: PACU  Anesthesia Type:Epidural  Level of Consciousness: awake, alert  and oriented  Airway & Oxygen Therapy: Patient Spontanous Breathing and Patient connected to nasal cannula oxygen  Post-op Assessment: Report given to RN and Post -op Vital signs reviewed and stable  Post vital signs: Reviewed and stable  Last Vitals:  Vitals Value Taken Time  BP 148/116 01/31/21 1019  Temp 37.9 C 01/31/21 1019  Pulse 144 01/31/21 1026  Resp 18 01/31/21 1019  SpO2 99 % 01/31/21 1026  Vitals shown include unvalidated device data.  Last Pain:  Vitals:   01/31/21 1019  TempSrc: Oral  PainSc:       Patients Stated Pain Goal: 3 (81/82/99 3716)  Complications: No complications documented.

## 2021-01-31 NOTE — Progress Notes (Signed)
Patient now status post epidural Exam  Ultrasound breech Cervix is 3 to 4 cm dilated Proceed with c section Consent signed Or notified

## 2021-01-31 NOTE — Anesthesia Procedure Notes (Signed)
Epidural Patient location during procedure: OB Start time: 01/31/2021 7:47 AM End time: 01/31/2021 7:57 AM  Staffing Anesthesiologist: Merlinda Frederick, MD Performed: anesthesiologist   Preanesthetic Checklist Completed: patient identified, IV checked, site marked, risks and benefits discussed, monitors and equipment checked, pre-op evaluation and timeout performed  Epidural Patient position: sitting Prep: DuraPrep Patient monitoring: heart rate, cardiac monitor, continuous pulse ox and blood pressure Approach: midline Location: L3-L4 Injection technique: LOR saline  Needle:  Needle type: Tuohy  Needle gauge: 17 G Needle length: 9 cm Needle insertion depth: 4 cm Catheter type: closed end flexible Catheter size: 20 Guage Catheter at skin depth: 9 cm Test dose: negative and Other  Assessment Events: blood not aspirated, injection not painful, no injection resistance and negative IV test  Additional Notes Informed consent obtained prior to proceeding including risk of failure, 1% risk of PDPH, risk of minor discomfort and bruising.  Discussed rare but serious complications including epidural abscess, permanent nerve injury, epidural hematoma.  Discussed alternatives to epidural analgesia and patient desires to proceed.  Timeout performed pre-procedure verifying patient name, procedure, and platelet count.  Patient tolerated procedure well.

## 2021-01-31 NOTE — Anesthesia Postprocedure Evaluation (Signed)
Anesthesia Post Note  Patient: Tessia Kassin  Procedure(s) Performed: CESAREAN SECTION (N/A )     Patient location during evaluation: Specials Recovery Anesthesia Type: Epidural and Spinal Level of consciousness: awake Pain management: pain level controlled Vital Signs Assessment: post-procedure vital signs reviewed and stable Respiratory status: spontaneous breathing Cardiovascular status: blood pressure returned to baseline and stable Postop Assessment: no headache, no backache, spinal receding, patient able to bend at knees, no apparent nausea or vomiting and adequate PO intake Anesthetic complications: no   No complications documented.  Last Vitals:  Vitals:   01/31/21 1140 01/31/21 1314  BP: (!) 118/49 119/78  Pulse: (!) 111 (!) 119  Resp: 20 (!) 21  Temp:  (!) 38.8 C  SpO2: 97% 96%    Last Pain:  Vitals:   01/31/21 1314  TempSrc: Axillary  PainSc:    Pain Goal: Patients Stated Pain Goal: 3 (01/31/21 0019)                 Barkley Boards

## 2021-02-01 ENCOUNTER — Encounter (HOSPITAL_COMMUNITY): Payer: Self-pay | Admitting: Obstetrics and Gynecology

## 2021-02-01 LAB — CBC
HCT: 26.9 % — ABNORMAL LOW (ref 36.0–46.0)
Hemoglobin: 8.6 g/dL — ABNORMAL LOW (ref 12.0–15.0)
MCH: 31.3 pg (ref 26.0–34.0)
MCHC: 32 g/dL (ref 30.0–36.0)
MCV: 97.8 fL (ref 80.0–100.0)
Platelets: 211 10*3/uL (ref 150–400)
RBC: 2.75 MIL/uL — ABNORMAL LOW (ref 3.87–5.11)
RDW: 13.6 % (ref 11.5–15.5)
WBC: 18.8 10*3/uL — ABNORMAL HIGH (ref 4.0–10.5)
nRBC: 0 % (ref 0.0–0.2)

## 2021-02-01 MED ORDER — MAGNESIUM HYDROXIDE 400 MG/5ML PO SUSP
30.0000 mL | Freq: Every day | ORAL | Status: DC | PRN
  Administered 2021-02-01 – 2021-02-02 (×2): 30 mL via ORAL
  Filled 2021-02-01 (×2): qty 30

## 2021-02-01 MED ORDER — ACETAMINOPHEN 325 MG PO TABS: 650.0000 mg | ORAL_TABLET | Freq: Four times a day (QID) | ORAL | Status: DC | PRN

## 2021-02-01 NOTE — Lactation Note (Signed)
This note was copied from a baby's chart. Lactation Consultation Note  Patient Name: Jenna Cross SYHNP'M Date: 02/01/2021 Reason for consult: Follow-up assessment;NICU baby;Late-preterm 34-36.6wks Age:36 hours   0800 - 0820 - I followed up with Jenna Cross. She states that she pumped two times yesterday. Her intention is to pump more today with the goal of pumping every 2-3 hours. I provided education on the importance of early and consistent pumping for milk production.  We also reviewed education provided yesterday on colostrum and oral care to baby. I encouraged her to take her pump kit to the NICU today if she planned to stay up there for a while. I also encouraged her to provide EBM to baby with the help of her NICU RN.  Jenna Cross states that she changed her flange to a size 27 for more comfort when pumping. She also has coconut oil, and I showed her how to use some on her flanges to increase comfort.  I reviewed cleaning procedures with pump parts and discouraged soaking her equipment. I recommended storing her EBM in separate containers with each pumping session.  I educated on the nature of milk productions for days 1-4.  All questions answered at this time. She states that she plans to see [name] today.  Maternal Data Has patient been taught Hand Expression?: Yes Does the patient have breastfeeding experience prior to this delivery?: Yes  Feeding Mother's Current Feeding Choice: Breast Milk and Donor Milk   Lactation Tools Discussed/Used Tools: Coconut oil;Flanges Flange Size: 27 Breast pump type: Double-Electric Breast Pump Pump Education: Setup, frequency, and cleaning;Milk Storage Reason for Pumping: NICU Pumping frequency: q2-3  Interventions Interventions: Breast feeding basics reviewed;DEBP;Education  Discharge    Consult Status Consult Status: Follow-up Date: 02/02/21 Follow-up type: In-patient    Lenore Manner 02/01/2021, 8:29 AM

## 2021-02-01 NOTE — Progress Notes (Signed)
Postpartum Progress Note  Postpartum Day primary C section w/ T incision for malpresentation, PPROM, chorioamnionitis at 35 weeks.  Subjective:  Patient reports no overnight events.  She reports well controlled pain, ambulating without difficulty, foley removed this AM, tolerating PO.  She reports Positive flatus, Negative BM.  Vaginal bleeding is minimal.  Objective: Blood pressure (!) 102/53, pulse (!) 107, temperature 98.1 F (36.7 C), temperature source Oral, resp. rate 16, height 5\' 4"  (1.626 m), weight 81.6 kg, SpO2 98 %, unknown if currently breastfeeding.  Physical Exam:  General: alert and no distress Lochia: appropriate Uterine Fundus: firm Incision: dressing in place DVT Evaluation: No evidence of DVT seen on physical exam.  Recent Labs    01/31/21 0803 02/01/21 0523  HGB 10.7* 8.6*  HCT 32.4* 26.9*    Assessment/Plan: . Postpartum Day 1, s/p C-section . Chorioamnionitis: last fever 101.8 F at 1314 4/26.  Tachycardia improved. WBC improved from 32.7 > 18.8. o Unasyn postpartum.  Will continue until 24 hrs afebrile. Continue tylenol. o Recheck CBC in AM . Baby in NICU care . Lactation following . Awaiting spontaneous void. Plan for bladder scan if unable to void spontaneously.   LOS: 5 days   Carlyon Shadow 02/01/2021, 7:26 AM

## 2021-02-01 NOTE — Progress Notes (Signed)
Patient screened out for psychosocial assessment since none of the following apply:  Psychosocial stressors documented in mother or baby's chart  Gestation less than 32 weeks  Code at delivery   Infant with anomalies Please contact the Clinical Social Worker if specific needs arise, by MOB's request, or if MOB scores greater than 9/yes to question 10 on [place] Postpartum Depression Screen.  Jace Fermin, LCSW Clinical Social Worker [facility] Cell#: [phone]     

## 2021-02-01 NOTE — Plan of Care (Signed)
  Problem: Clinical Measurements: Goal: Ability to maintain clinical measurements within normal limits will improve Outcome: Progressing   Problem: Coping: Goal: Level of anxiety will decrease Outcome: Progressing   Problem: Pain Managment: Goal: General experience of comfort will improve Outcome: Progressing   Problem: Clinical Measurements: Goal: Complications related to the disease process, condition or treatment will be avoided or minimized Outcome: Progressing   Problem: Clinical Measurements: Goal: Ability to maintain clinical measurements within normal limits will improve Outcome: Progressing   Problem: Education: Goal: Knowledge of condition will improve Outcome: Progressing   Problem: Activity: Goal: Will verbalize the importance of balancing activity with adequate rest periods Outcome: Progressing Goal: Ability to tolerate increased activity will improve Outcome: Progressing   Problem: Skin Integrity: Goal: Demonstration of wound healing without infection will improve Outcome: Progressing

## 2021-02-01 NOTE — Op Note (Signed)
[name], Cross MEDICAL RECORD NO: [id number] ACCOUNT NO: [id number] DATE OF BIRTH: 14-Jun-1986 FACILITY: [facility] LOCATION: [facility] PHYSICIAN: [place] Frieden L. Helane Rima, MD  Operative Report   DATE OF PROCEDURE: 01/31/2021  PREOPERATIVE DIAGNOSES:  Intrauterine pregnancy at 35 weeks, PPROM, chorioamnionitis and malpresentation.  POSTOPERATIVE DIAGNOSES:  Intrauterine pregnancy at 35 weeks, PPROM, chorioamnionitis, malpresentation and transverse lie back down position.  PROCEDURE:  Primary cesarean section with T-shaped incision.  SURGEON:  Taejon Irani L. Inette Doubrava, MD  ANESTHESIA:  Epidural.  ESTIMATED BLOOD LOSS:  Less than 500 mL.  COMPLICATIONS:  None.  DRAINS:  Foley catheter.  PATHOLOGY:  Placenta sent to pathology.  DESCRIPTION OF PROCEDURE:  The patient was taken to the operating room.  Her anesthesia had been administered, she was prepped and draped.  A Foley catheter was inserted.  Allis test was performed.  A low transverse incision was made, carried down to the  fascia, and fascia was scored in the midline.  The peritoneum was entered bluntly.  The peritoneal incision was then stretched.  The bladder blade was inserted and the bladder flap was created sharply and then digitally.  The bladder blade was then  readjusted.  A low transverse incision was made in the uterus.  The lower uterine segment was very thick and not developed.  Upon entry into the amniotic cavity, we noticed that the baby's left arm was protruding and the baby was in a transverse lie back  down position and very wedged in the upper part of the uterus.  I then elected to perform a T shaped incision, so that I could better maneuver around the baby since I could not deliver the feet easily without doing the T-shaped incision.  I also  requested nitroglycerin to be administered for easier maneuverability.  I then grabbed the feet, delivered the baby with complete breech extraction.  The baby was a female infant.  There was  a loose nuchal cord x1.  The cord was clamped and cut and the  baby was handed to the waiting neonatal team.  The uterus was exteriorized and cleared of all clots and debris.  The uterine incision was then closed.  The upper T part was then closed in 2 layers using 0 chromic and then the low transverse part was  closed in 2 layers using 0 chromic as well.  There was a little bit of oozing at the incision site, which then was hemostatic after use of electrocautery and Arista.  Irrigation had performed, hemostasis was again noted.  We then noted the adnexa were  normal.  The peritoneum was closed using 0 Vicryl.  The fascia was closed using an 0 Vicryl running stitch.  The skin was closed with a subcuticular after the subcutaneous was closed with plain gut suture.  Benzoin, Steri-Strips and honeycomb dressing  were applied.  All sponge, lap and instrument counts were correct x2.  The patient went to recovery room in stable condition.   NIK D: 01/31/2021 12:04:56 pm T: 02/01/2021 3:43:00 am  JOB: [id number]/ [id number]

## 2021-02-02 LAB — CBC
HCT: 24.9 % — ABNORMAL LOW (ref 36.0–46.0)
Hemoglobin: 7.9 g/dL — ABNORMAL LOW (ref 12.0–15.0)
MCH: 30.9 pg (ref 26.0–34.0)
MCHC: 31.7 g/dL (ref 30.0–36.0)
MCV: 97.3 fL (ref 80.0–100.0)
Platelets: 177 10*3/uL (ref 150–400)
RBC: 2.56 MIL/uL — ABNORMAL LOW (ref 3.87–5.11)
RDW: 13.7 % (ref 11.5–15.5)
WBC: 11.7 10*3/uL — ABNORMAL HIGH (ref 4.0–10.5)
nRBC: 0.2 % (ref 0.0–0.2)

## 2021-02-02 LAB — SURGICAL PATHOLOGY

## 2021-02-02 MED ORDER — CYCLOBENZAPRINE HCL 10 MG PO TABS
5.0000 mg | ORAL_TABLET | Freq: Once | ORAL | Status: AC
  Administered 2021-02-02: 5 mg via ORAL
  Filled 2021-02-02: qty 1

## 2021-02-02 MED ORDER — CYCLOBENZAPRINE HCL 10 MG PO TABS
5.0000 mg | ORAL_TABLET | Freq: Three times a day (TID) | ORAL | Status: DC | PRN
  Administered 2021-02-02 – 2021-02-03 (×3): 5 mg via ORAL
  Filled 2021-02-02 (×4): qty 1

## 2021-02-02 MED ORDER — OXYCODONE HCL 5 MG PO TABS
10.0000 mg | ORAL_TABLET | ORAL | Status: DC | PRN
  Administered 2021-02-02 – 2021-02-03 (×5): 10 mg via ORAL
  Filled 2021-02-02 (×5): qty 2

## 2021-02-02 NOTE — Lactation Note (Signed)
This note was copied from a baby's chart. Lactation Consultation Note  Patient Name: Jenna Cross BJYNW'G Date: 02/02/2021 Reason for consult: Follow-up assessment;NICU baby;Late-preterm 34-36.6wks Age:36 hours  Follow up visit to P2 mother of 60 hours old infant, currently in NICU. Mother is pumping, initiation setting, left breast upon arrival. Collecting ~99mL of EBM. LC reviewed hand expression and noted colostrum easily expressed.    Encouraged mother to continue pumping every 3 hours. Reinforced the importance of self-care and its influence in milk supply. Mother verbalizes in agreement.   Encouraged to request [facility] for any needs, support or questions. Praised mother for her milk supply, effort and dedication. Father of infant is present and supportive.    Feeding Mother's Current Feeding Choice: Breast Milk and Donor Milk  Lactation Tools Discussed/Used Tools: Pump;Flanges;Coconut oil Flange Size: 27 Breast pump type: Double-Electric Breast Pump Reason for Pumping: maternal infant separation Pumping frequency: Q3 Pumped volume: 1 mL (mother pumped only left breast)  Interventions Interventions: Breast feeding basics reviewed;Expressed milk;Coconut oil;DEBP;Breast massage;Hand express;Education  Consult Status Consult Status: Follow-up Date: 02/02/21 Follow-up type: In-patient    Jenna Cross [name] 02/02/2021, 7:50 PM

## 2021-02-02 NOTE — Progress Notes (Signed)
Subjective: Postpartum Day 2: Cesarean Delivery Patient reports incisional pain, tolerating PO, + flatus, + BM and no problems voiding.  Patient reports right neck muscle soreness from rigors 2 days ago; Flexeril x 1 given and helpful.  Would like to continue.  Pain adequately treated with Oxi 10 mg.    Objective: Vital signs in last 24 hours: Temp:  [98 F (36.7 C)-100.2 F (37.9 C)] 98.1 F (36.7 C) (04/28 0808) Pulse Rate:  [109-128] 128 (04/28 0808) Resp:  [16-18] 18 (04/28 0430) BP: (98-114)/(45-87) 110/60 (04/28 0808) SpO2:  [96 %-99 %] 96 % (04/28 3354)  Physical Exam:  General: alert, cooperative and appears stated age Lochia: appropriate Uterine Fundus: firm Incision: healing well, no significant drainage, no dehiscence DVT Evaluation: No evidence of DVT seen on physical exam. Negative Homan's sign. No cords or calf tenderness.  Recent Labs    02/01/21 0523 02/02/21 0521  HGB 8.6* 7.9*  HCT 26.9* 24.9*    Assessment/Plan: Status post Cesarean section. Doing well postoperatively.  Continue current care. Chorio-now 24 hours afebrile (last fever 101.7 4/27 at 0802).  Given severity of infection, will continue Unasyn x 48 hours and plan to d/c home tomorrow.  WBC improved (32.7->18.8->11.7).   Neck muscle pain/tightness-Flexeril ordered tid  Linda Hedges 02/02/2021, 8:49 AM

## 2021-02-03 MED ORDER — AMOXICILLIN-POT CLAVULANATE 500-125 MG PO TABS: 500.0000 mg | ORAL_TABLET | Freq: Two times a day (BID) | ORAL | 0 refills | Status: DC

## 2021-02-03 MED ORDER — OXYCODONE HCL 10 MG PO TABS: 5.0000 mg | ORAL_TABLET | ORAL | 0 refills | Status: DC | PRN

## 2021-02-03 MED ORDER — AMOXICILLIN-POT CLAVULANATE 500-125 MG PO TABS
500.0000 mg | ORAL_TABLET | Freq: Two times a day (BID) | ORAL | Status: DC
  Administered 2021-02-03 (×2): 500 mg via ORAL
  Filled 2021-02-03 (×3): qty 1

## 2021-02-03 MED ORDER — IBUPROFEN 600 MG PO TABS: 600.0000 mg | ORAL_TABLET | Freq: Four times a day (QID) | ORAL | 0 refills | Status: DC | PRN

## 2021-02-03 NOTE — Discharge Summary (Signed)
   Postpartum Discharge Summary       Patient Name: Jenna Cross DOB: 09/02/1986 MRN: [id number]  Date of admission: 01/27/2021 Delivery date:01/31/2021  Delivering provider: GREWAL, MICHELLE  Date of discharge: 02/03/2021  Admitting diagnosis: Preterm premature rupture of membranes (PPROM) with unknown onset of labor [O42.919] Pregnancy [Z34.90] History of preterm premature rupture of membranes (PPROM) [Z87.59] Intrauterine pregnancy: [redacted]w[redacted]d     Secondary diagnosis:  Active Problems:   Preterm premature rupture of membranes (PPROM) with unknown onset of labor   History of preterm premature rupture of membranes (PPROM)  Additional problems:      Discharge diagnosis: Preterm Pregnancy Delivered                                              Post partum procedures:  Augmentation: N/A Complications: Intrauterine Inflammation or infection (Chorioamniotis)  Hospital course: Onset of Labor With Unplanned C/S   34 y.o. yo G2P1102 at [redacted]w[redacted]d was admitted in Latent Labor on 01/27/2021. Patient had a labor course significant for PPROM, Chorioamnionitis, Malpresentaiton. The patient went for cesarean section due to Malpresentation. Delivery details as follows: Membrane Rupture Time/Date: 5:00 AM ,01/27/2021   Delivery Method:C-Section, Low Transverse  Details of operation can be found in separate operative note. Patient had an uncomplicated postpartum course.  She is ambulating,tolerating a regular diet, passing flatus, and urinating well.  Patient is discharged home in stable condition 02/03/21.  Newborn Data: Birth date:01/31/2021  Birth time:9:25 AM  Gender:Female  Living status:Living  Apgars:4 ,8  Weight:2750 g   Magnesium Sulfate received: No BMZ received: Yes Rhophylac:N/A MMR:N/A T-DaP:Given prenatally Flu: Yes Transfusion:No  Physical exam  Vitals:   02/02/21 1107 02/02/21 1450 02/02/21 2249 02/03/21 0556  BP: (!) 104/52 104/61 127/68 122/89  Pulse: (!) 108 (!) 120 (!)  120 (!) 105  Resp:  19 18 18  Temp: 97.9 F (36.6 C) 98.6 F (37 C) 99.2 F (37.3 C) 98.6 F (37 C)  TempSrc: Oral Oral Oral Oral  SpO2: 97%     Weight:      Height:       General: alert, cooperative and no distress Lochia: appropriate Uterine Fundus: firm Incision: Healing well with no significant drainage DVT Evaluation: No evidence of DVT seen on physical exam. Labs: Lab Results  Component Value Date   WBC 11.7 (H) 02/02/2021   HGB 7.9 (L) 02/02/2021   HCT 24.9 (L) 02/02/2021   MCV 97.3 02/02/2021   PLT 177 02/02/2021   CMP Latest Ref Rng & Units 04/05/2016  Glucose 65 - 99 mg/dL 83  BUN 6 - 20 mg/dL 12  Creatinine 0.57 - 1.00 mg/dL 0.83  Sodium 134 - 144 mmol/L 140  Potassium 3.5 - 5.2 mmol/L 4.5  Chloride 96 - 106 mmol/L 100  CO2 18 - 29 mmol/L 19  Calcium 8.7 - 10.2 mg/dL 9.3  Total Protein 6.0 - 8.5 g/dL 7.2  Total Bilirubin 0.0 - 1.2 mg/dL 0.5  Alkaline Phos 39 - 117 IU/L 68  AST 0 - 40 IU/L 16  ALT 0 - 32 IU/L 10   Edinburgh Score: Edinburgh Postnatal Depression Scale Screening Tool 02/01/2021  I have been able to laugh and see the funny side of things. 0  I have looked forward with enjoyment to things. 1  I have blamed myself unnecessarily when things went wrong. 1  I have   been anxious or worried for no good reason. 2  I have felt scared or panicky for no good reason. 2  Things have been getting on top of me. 1  I have been so unhappy that I have had difficulty sleeping. 0  I have felt sad or miserable. 0  I have been so unhappy that I have been crying. 0  The thought of harming myself has occurred to me. 0  Edinburgh Postnatal Depression Scale Total 7      After visit meds:  Allergies as of 02/03/2021   No Known Allergies     Medication List    STOP taking these medications   Loryna 3-0.02 MG tablet Generic drug: drospirenone-ethinyl estradiol   spironolactone 50 MG tablet Commonly known as: ALDACTONE   valACYclovir 500 MG  tablet Commonly known as: VALTREX     TAKE these medications   acetaminophen 500 MG tablet Commonly known as: TYLENOL Take 1,000 mg by mouth every 6 (six) hours as needed for mild pain or headache.   amoxicillin-clavulanate 500-125 MG tablet Commonly known as: AUGMENTIN Take 1 tablet (500 mg total) by mouth 2 (two) times daily.   cetirizine 10 MG tablet Commonly known as: ZYRTEC Take 10 mg by mouth at bedtime.   docusate sodium 100 MG capsule Commonly known as: Colace Take 1 capsule (100 mg total) by mouth 2 (two) times daily.   ferrous sulfate 325 (65 FE) MG EC tablet Take 1 tablet (325 mg total) by mouth 2 (two) times daily.   ibuprofen 600 MG tablet Commonly known as: ADVIL Take 1 tablet (600 mg total) by mouth every 6 (six) hours as needed. What changed: Another medication with the same name was added. Make sure you understand how and when to take each.   ibuprofen 600 MG tablet Commonly known as: ADVIL Take 1 tablet (600 mg total) by mouth every 6 (six) hours as needed for mild pain or cramping. What changed: You were already taking a medication with the same name, and this prescription was added. Make sure you understand how and when to take each.   Oxycodone HCl 10 MG Tabs Take 0.5 tablets (5 mg total) by mouth every 4 (four) hours as needed for moderate pain or severe pain.   polyethylene glycol 17 g packet Commonly known as: MIRALAX / GLYCOLAX Take 17 g by mouth daily.   prenatal multivitamin Tabs tablet Take 1 tablet by mouth at bedtime.   Zenpep 40000-136000 units Cpep Generic drug: Pancrelipase (Lip-Prot-Amyl) Reported on 04/05/2016        Discharge home in stable condition Infant Feeding: Breast Infant Disposition:NICU Discharge instruction: per After Visit Summary and Postpartum booklet. Activity: Advance as tolerated. Pelvic rest for 6 weeks.  Diet: routine diet Anticipated Birth Control: Unsure Postpartum Appointment:6 weeks Additional  Postpartum F/U:   Future Appointments:No future appointments. Follow up Visit:      02/03/2021 David C Lowe, MD  

## 2021-02-03 NOTE — Lactation Note (Signed)
This note was copied from a baby's chart. Lactation Consultation Note  Patient Name: Jenna Cross Date: 02/03/2021 Reason for consult: Follow-up assessment Age:36 hours  1402 - I followed up with [name] upon request to assist with latching [name]. We placed him STS on the left breast in cradle position. Initially I placed a size 24 NS on the breast, and he latched to it immediately with some rhythmic suckling sequences. After a few minutes, he fell asleep at the breast. I removed the NS and helped [name] HE her milk on that side. We allowed baby to nuzzle the breast, and after a few minutes of rest, he began to cue again. I then assisted with latching him on that side without the aid of a nipple shield.  Baby remained latched for at least 15 minutes with suckling sequences and long rest breaks. I noted occasional swallows. RN, [name], assisted with starting gavage feeding while baby was latched.  I provided reinforcement for latch technique and positioning, and I praised [name] for her good work. I educated at the bedside on developmental growth and breast feeding progress. I encouraged her to continue to pump every three hours (both breasts) for 15 minutes and to put baby to breast when visiting baby during feeding times. [name] milk is beginning to transition. She last pumped about 3-5 mls.  Follow up on 4/30 or 5/1 would be helpful.  Feeding Mother's Current Feeding Choice: Breast Milk and Donor Milk  LATCH Score Latch: Repeated attempts needed to sustain latch, nipple held in mouth throughout feeding, stimulation needed to elicit sucking reflex.  Audible Swallowing: A few with stimulation  Type of Nipple: Everted at rest and after stimulation  Comfort (Breast/Nipple): Soft / non-tender  Hold (Positioning): Assistance needed to correctly position infant at breast and maintain latch.  LATCH Score: 7   Lactation Tools Discussed/Used Tools:  [name];Other (comment) (discontinued NS) Nipple shield size: 24 Pumping frequency: q3 hours Pumped volume: 3 mL  Interventions Interventions: Breast feeding basics reviewed;Assisted with latch;Skin to skin;Hand express;Breast compression;Support pillows;Adjust position;Education  Discharge Pump: DEBP  Consult Status Consult Status: Follow-up Date: 02/04/21 Follow-up type: In-patient    Lenore Manner 02/03/2021, 3:20 PM

## 2021-02-09 ENCOUNTER — Ambulatory Visit: Payer: Self-pay

## 2021-02-09 NOTE — Lactation Note (Signed)
This note was copied from a baby's chart. Lactation Consultation Note  Patient Name: Boy Ashlan Dignan LEXNT'Z Date: 02/09/2021 Reason for consult: Follow-up assessment Age:36 days  1306 - 1316 - I visited Ms. Puller and provided discharge education. She states that she is primarily bottle feeding Barth Kirks and continuing to do some breast pumping. She may breast feed when she goes home. She is pumping about 30 mls/session.   I reviewed some of the basic education on breast pumping and milk production. I also reviewed community support resources. Ms. Clowers stages that she has our contact information (brochure). She will follow up with [facility], and she is aware of the lactation services offered there.   I provided my congratulations and offered our continued support as needed in the time after discharge. She had no additional questions at this time.   Feeding Mother's Current Feeding Choice: Breast Milk and Formula Nipple Type: Dr. Myra Gianotti Preemie   Interventions Interventions: Breast feeding basics reviewed;Education  Discharge Discharge Education: Outpatient recommendation Pump: Personal  Consult Status Consult Status: Complete Date: 02/09/21    Lenore Manner 02/09/2021, 1:22 PM

## 2021-05-26 ENCOUNTER — Ambulatory Visit (INDEPENDENT_AMBULATORY_CARE_PROVIDER_SITE_OTHER): Payer: BC Managed Care – PPO | Admitting: Plastic Surgery

## 2021-05-26 ENCOUNTER — Encounter: Payer: Self-pay | Admitting: Plastic Surgery

## 2021-05-26 ENCOUNTER — Other Ambulatory Visit: Payer: Self-pay

## 2021-05-26 VITALS — BP 112/74 | HR 89 | Ht 64.0 in | Wt 149.6 lb

## 2021-05-26 DIAGNOSIS — Z719 Counseling, unspecified: Secondary | ICD-10-CM | POA: Diagnosis not present

## 2021-05-26 DIAGNOSIS — N644 Mastodynia: Secondary | ICD-10-CM | POA: Diagnosis not present

## 2021-05-26 DIAGNOSIS — N63 Unspecified lump in unspecified breast: Secondary | ICD-10-CM | POA: Insufficient documentation

## 2021-05-26 NOTE — Progress Notes (Signed)
Patient ID: Jenna Cross, female    DOB: [birth year], 35 y.o.   MRN: [id number]   Chief Complaint  Patient presents with   Advice Only    The patient is a 35 year old female here for evaluation of her breasts.  She is 5 feet 4 inches tall and weighs 149 pounds.  Her sternal notch to nipple distance on the right is 27 cm and on the left 27 cm.  She has not had a mammogram.  She does not have a family history of breast cancer.  She did have a benign cyst in her right breast that was either biopsied or excised when she was in college.  There is notable firm tissue in that area this is in the lower quadrant of the right breast.  She has a 43-monthold and a 430-monthld child.   Review of Systems  Constitutional: Negative.  Negative for activity change and appetite change.  Eyes: Negative.   Respiratory: Negative.  Negative for chest tightness and shortness of breath.   Cardiovascular:  Negative for leg swelling.  Gastrointestinal: Negative.   Endocrine: Negative.   Genitourinary: Negative.   Musculoskeletal: Negative.   Skin:  Negative for color change and wound.  Hematological: Negative.   Psychiatric/Behavioral: Negative.     Past Medical History:  Diagnosis Date   Anxiety    Anxiety    no medication    Past Surgical History:  Procedure Laterality Date   BREAST SURGERY     cyst removal from right breast    CESAREAN SECTION N/A 01/31/2021   Procedure: CESAREAN SECTION;  Surgeon: GrDian QueenMD;  Location: [facility];  Service: Obstetrics;  Laterality: N/A;   excision of fibroadenoma of breast     TONSILLECTOMY     and adenoids   TONSILLECTOMY AND ADENOIDECTOMY     WISDOM TOOTH EXTRACTION        Current Outpatient Medications:    acetaminophen (TYLENOL) 500 MG tablet, Take 1,000 mg by mouth every 6 (six) hours as needed for mild pain or headache., Disp: , Rfl:    cetirizine (ZYRTEC) 10 MG tablet, Take 10 mg by mouth at bedtime., Disp: , Rfl:    ferrous sulfate  325 (65 FE) MG EC tablet, Take 1 tablet (325 mg total) by mouth 2 (two) times daily., Disp: 60 tablet, Rfl: 2   ibuprofen (ADVIL) 600 MG tablet, Take 1 tablet (600 mg total) by mouth every 6 (six) hours as needed., Disp: 30 tablet, Rfl: 0   Objective:   Vitals:   05/26/21 1019  BP: 112/74  Pulse: 89  SpO2: 99%    Physical Exam Vitals and nursing note reviewed.  Constitutional:      Appearance: Normal appearance.  HENT:     Head: Normocephalic and atraumatic.  Cardiovascular:     Rate and Rhythm: Normal rate.     Pulses: Normal pulses.  Pulmonary:     Effort: Pulmonary effort is normal.  Abdominal:     General: Abdomen is flat. There is no distension.     Palpations: There is no mass.  Skin:    General: Skin is warm.     Capillary Refill: Capillary refill takes less than 2 seconds.  Neurological:     General: No focal deficit present.     Mental Status: She is alert and oriented to person, place, and time.  Psychiatric:        Mood and Affect: Mood normal.  Behavior: Behavior normal.        Thought Content: Thought content normal.    Assessment & Plan:  Encounter for counseling  I can provide the patient with a quote for excision of bilateral axillary tissue with liposuction.  My recommendation would be for her to wait until she has her next child.  At the very minimum she would need to wait 6 months from her last breast feeding which would be in 2 more months.  She would also do well with a mastopexy.  We can provide her with a quote just for an information.  Due to the firmness in the right breast I would like to go ahead with a mammogram.  The patient is in agreement.  Pictures were obtained of the patient and placed in the chart with the patient's or guardian's permission.     Blair, DO

## 2021-06-13 ENCOUNTER — Other Ambulatory Visit: Payer: Self-pay | Admitting: Obstetrics and Gynecology

## 2021-06-13 DIAGNOSIS — N63 Unspecified lump in unspecified breast: Secondary | ICD-10-CM

## 2021-06-19 ENCOUNTER — Ambulatory Visit
Admission: RE | Admit: 2021-06-19 | Discharge: 2021-06-19 | Disposition: A | Discharge status: Home / Self Care | Payer: BC Managed Care – PPO | Source: Ambulatory Visit | Attending: Obstetrics and Gynecology | Admitting: Obstetrics and Gynecology

## 2021-06-19 ENCOUNTER — Other Ambulatory Visit: Payer: Self-pay | Admitting: Obstetrics and Gynecology

## 2021-06-19 ENCOUNTER — Other Ambulatory Visit: Payer: BC Managed Care – PPO

## 2021-06-19 ENCOUNTER — Other Ambulatory Visit: Payer: Self-pay

## 2021-06-19 DIAGNOSIS — N6489 Other specified disorders of breast: Secondary | ICD-10-CM

## 2021-06-19 DIAGNOSIS — N63 Unspecified lump in unspecified breast: Secondary | ICD-10-CM

## 2021-06-19 DIAGNOSIS — N6320 Unspecified lump in the left breast, unspecified quadrant: Secondary | ICD-10-CM

## 2021-06-19 DIAGNOSIS — N6310 Unspecified lump in the right breast, unspecified quadrant: Secondary | ICD-10-CM

## 2021-06-19 DIAGNOSIS — R599 Enlarged lymph nodes, unspecified: Secondary | ICD-10-CM

## 2021-06-19 IMAGING — US US BREAST*R* LIMITED INC AXILLA
1 series · 16 of 25 positions shown · non-contrast
Comparison: None.

CLINICAL DATA: 34-year-old female presenting for evaluation of a
palpable lump in the right breast which she noticed about 2 weeks
ago.



[Series 1: us breast*right* limited inc axilla · 0.06mm/px · 16 of 26 slices shown]
[im 1/26]
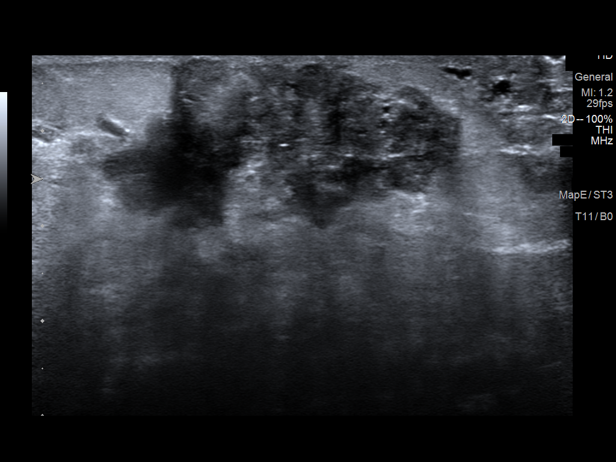
[im 3/26]
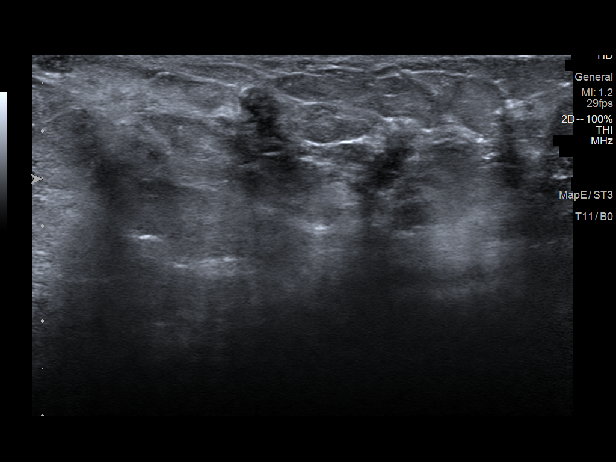
[im 4/26]
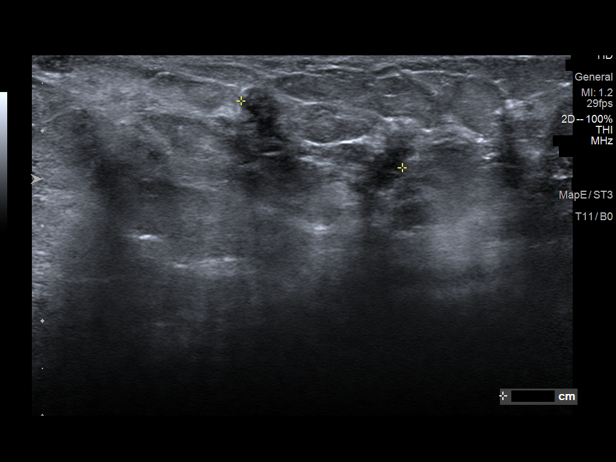
[im 6/26]
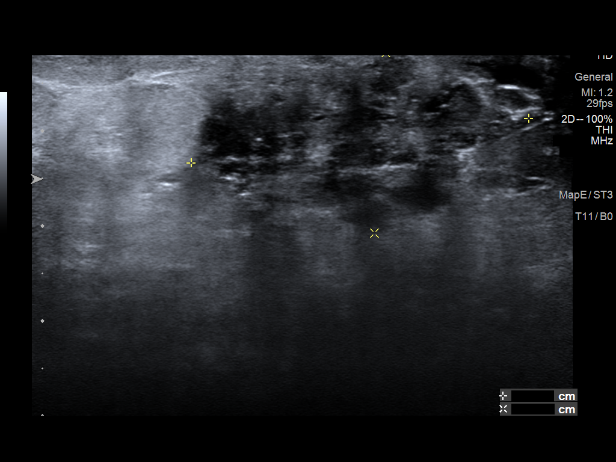
[im 8/26]
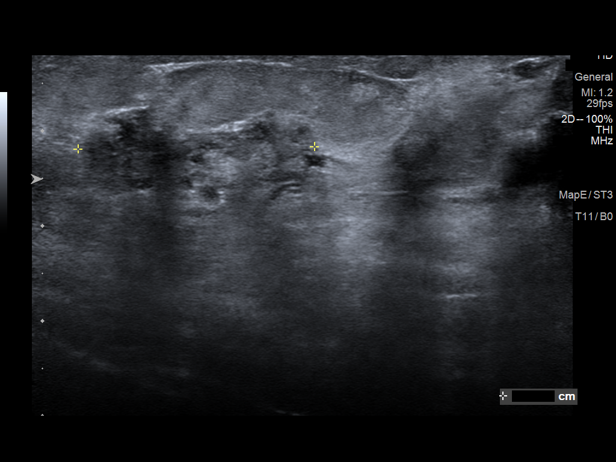
[im 9/26]
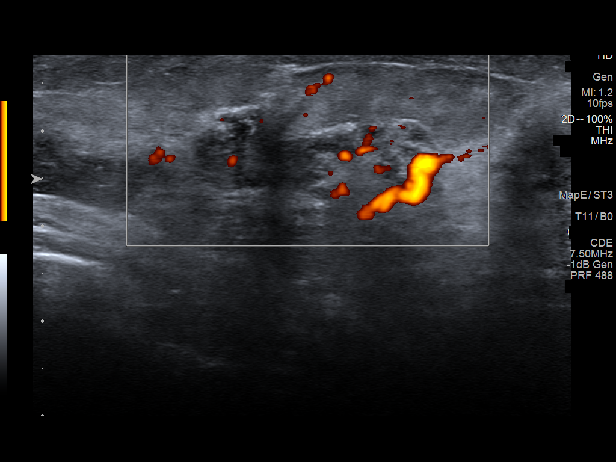
[im 11/26]
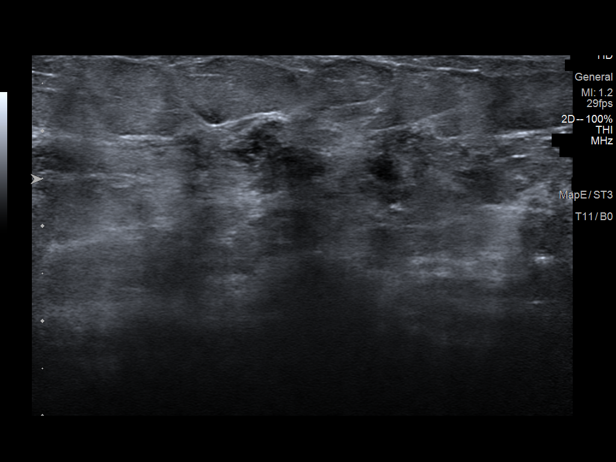
[im 12/26]
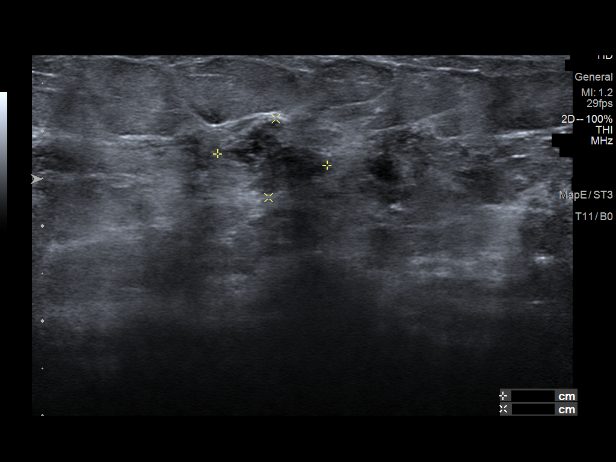
[im 14/26]
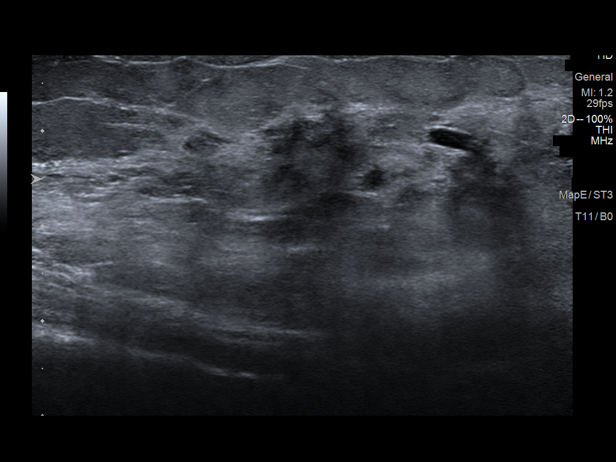
[im 15/26]
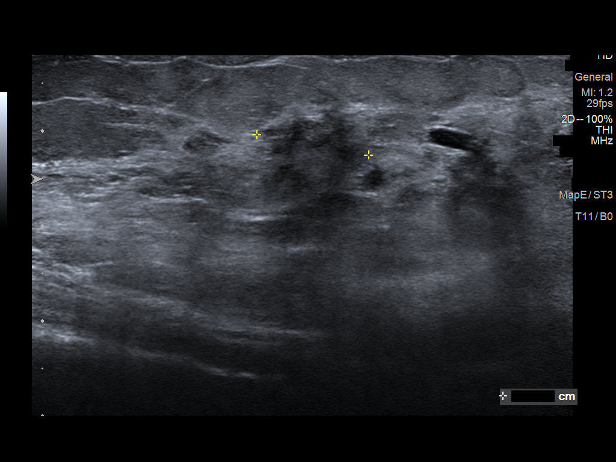
[im 17/26]
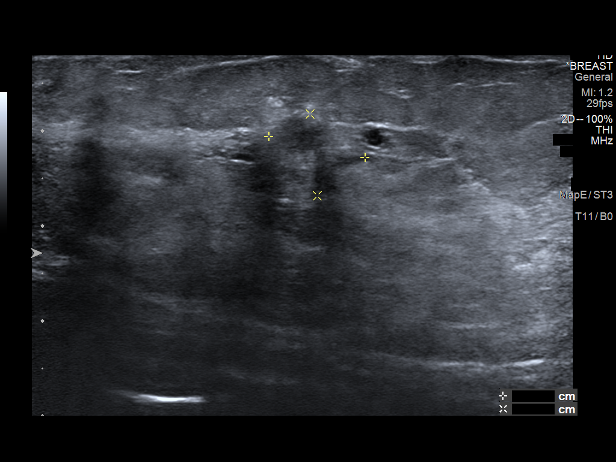
[im 18/26]
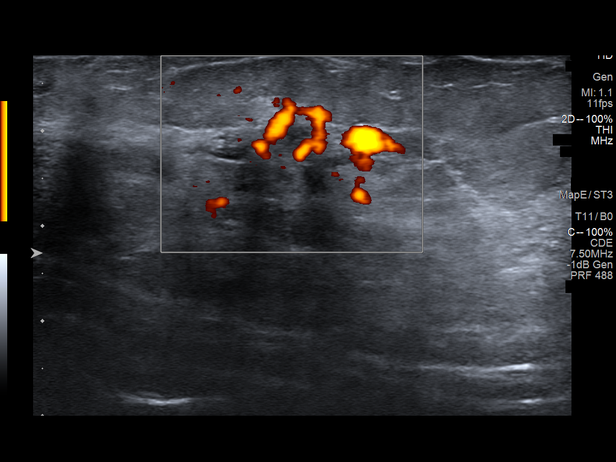
[im 20/26]
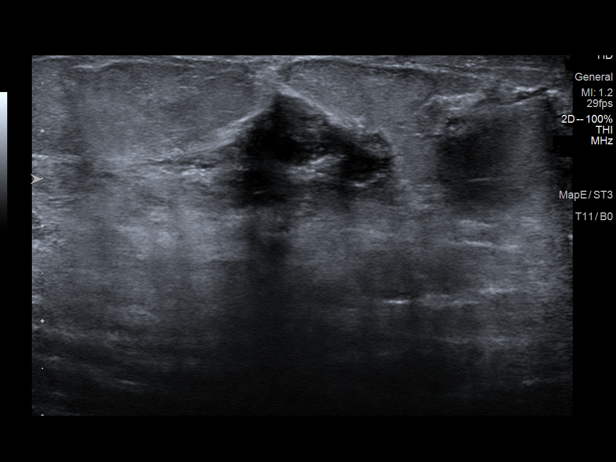
[im 22/26]
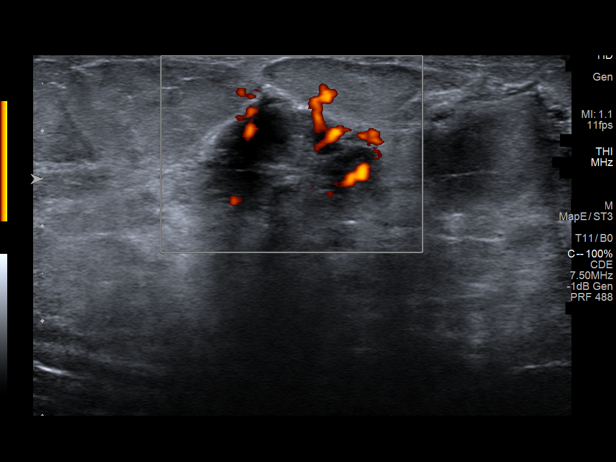
[im 23/26]
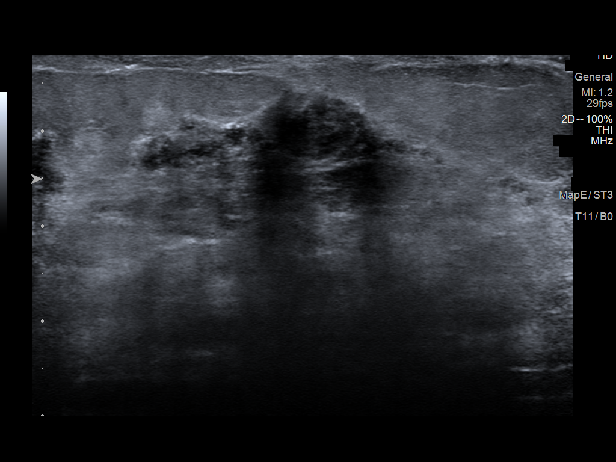
[im 26/26]
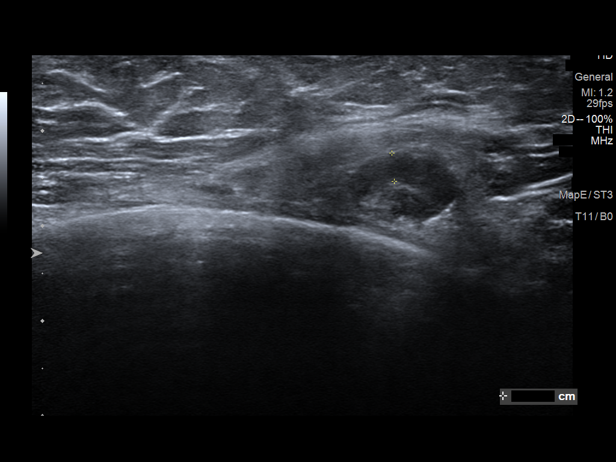

[16 of 25 positions shown; findings below may reference images not displayed]

ACR Breast Density Category c: The breast tissue is heterogeneously
dense, which may obscure small masses.
FINDINGS: BBs bracketing the palpable site of concern have been placed along
the anterolateral aspect of the right breast. There is an irregular
mass situated between these 2 markers. Tomosynthesis images also
reveals at least 1 and possibly 2 confluent areas of distortion in
the upper-outer quadrant of the right breast, with a small oval mass
extending posterior to the distortion. The palpable mass along with
the distortion and nodular appearing tissue spans approximately 8
cm. Additional nodular appearing tissue is noted in the inferior
aspect of the right breast with at least 7.5 cm of tissue involved.

Spot compression tomosynthesis images of the medial left breast
demonstrates an asymmetry/possible distortion in the middle to
anterior depth. An asymmetry in the lateral aspect of the left
breast appears to resolve on the diagnostic images. No other
suspicious calcifications, masses or areas of distortion are seen in
the bilateral breasts.

On physical exam, there is a firm broad palpable lump in the
retroareolar right breast, with firm nodular tissue extending into
the inferior and lateral breast.

Ultrasound targeted to the palpable site in the retroareolar right
breast demonstrates an irregular highly vascular mass measuring
approximately 3.8 x 1.9 x 3.6 cm.

Heterogeneous hypoechoic tissue is noted in the right breast at 6
o'clock, 5 cm from the nipple measuring 1.8 cm. At 9-10 o'clock
extending posterior to the palpable mass, and in the region of the
areas of distortion there are multiple scattered areas of
heterogeneous tissue and irregular masses. The largest is at 9
o'clock, 5 cm from the nipple spanning 2.5 cm. There is a clustered
chain of masses at 12 o'clock, 2-3 cm from the nipple spanning at
least 4.8 cm.

In the right axilla, there is 1 prominent lymph node with cortex
measuring approximately 4 mm. A second prominent lymph node has a
cortex of 3 mm, which is within normal limits for size.

Ultrasound targeted to the left breast at 3 o'clock, 4 cm from the
nipple demonstrates a hypoechoic oval circumscribed mass with
posterior acoustic shadowing measuring 0.4 x 0.3 x 0.4 cm.

No sonographic abnormalities are seen in the medial aspect of the
left breast in the area of the asymmetry identified
mammographically.

Ultrasound of the left axilla demonstrates multiple normal-appearing
lymph nodes.
IMPRESSION: 1. There is a 3.8 cm mass at the palpable site of concern in the
retroareolar right breast. There is a broad span of abnormal
tissue/scattered masses involving the inferior, lower outer and
upper outer quadrants concerning for multicentric disease.

2. There is 1 prominent right axillary lymph node with a 4 mm
cortex, and 1 other which is borderline measuring 3 mm.

3. There is an indeterminate 4 mm mass in the left breast at 3
o'clock, which overall has a benign morphology.

4. There is an indeterminate asymmetry in the medial left breast
without a sonographic correlate.

5.  No evidence of left axillary lymphadenopathy.

RECOMMENDATION:
1. Ultrasound-guided biopsy is recommended for the palpable
retroareolar right breast mass.

2. Ultrasound-guided biopsy is recommended for a distant mass in the
right breast, at the discretion of the radiologist performing the
procedure.

3. Ultrasound-guided biopsy of the prominent right axillary lymph
node.

4. Ultrasound-guided biopsy of the left breast 4 mm mass at 3
o'clock.

5.  Stereotactic biopsy of the asymmetry in the medial left breast.

Given the number of recommended biopsies, these should be performed
on 2 separate days. The right breast triple biopsy has been
scheduled for [DATE] at [DATE] a.m. The left sided biopsies have
been scheduled for [DATE] at [DATE] a.m.

Given the patient's age, breast tissue density and potential extent
of disease, consider bilateral breast MRI following the biopsies.

I have discussed the findings and recommendations with the patient.
If applicable, a reminder letter will be sent to the patient
regarding the next appointment.

BI-RADS CATEGORY  5: Highly suggestive of malignancy.

## 2021-06-19 IMAGING — MG DIGITAL DIAGNOSTIC BILAT W/ TOMO W/ CAD
7 of 18 series · 7 of 40 positions shown · non-contrast
Comparison: None.

CLINICAL DATA: 34-year-old female presenting for evaluation of a
palpable lump in the right breast which she noticed about 2 weeks
ago.



[R CC synth-2D]
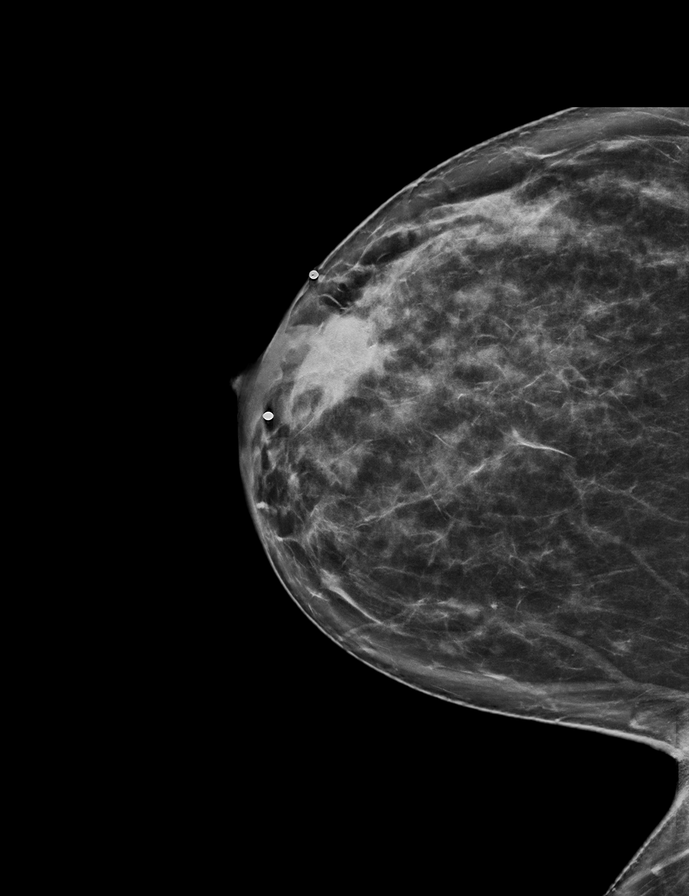

[R TAN synth-2D (1 of 2)]
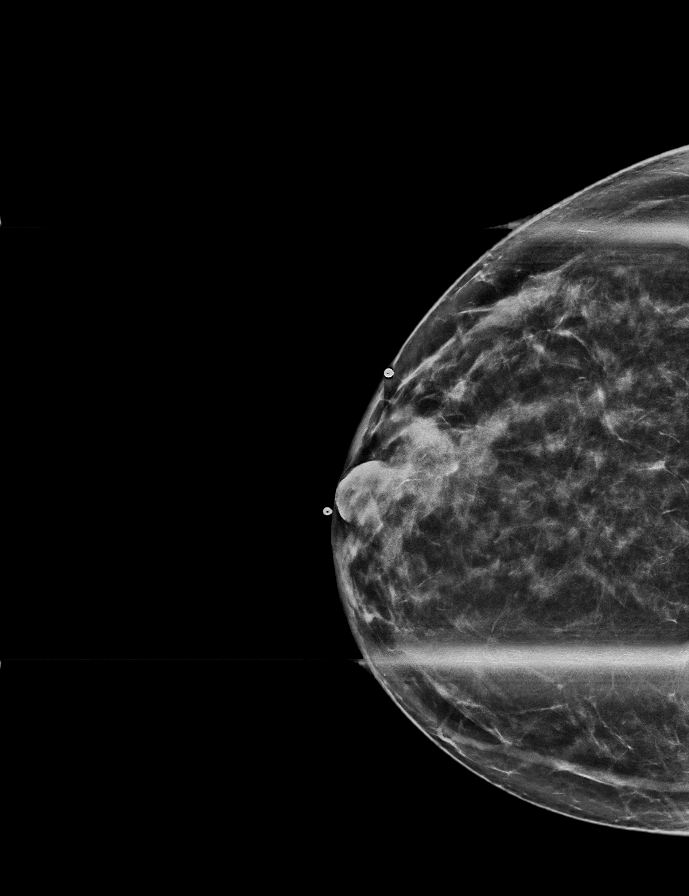

[L CC synth-2D (1 of 2)]
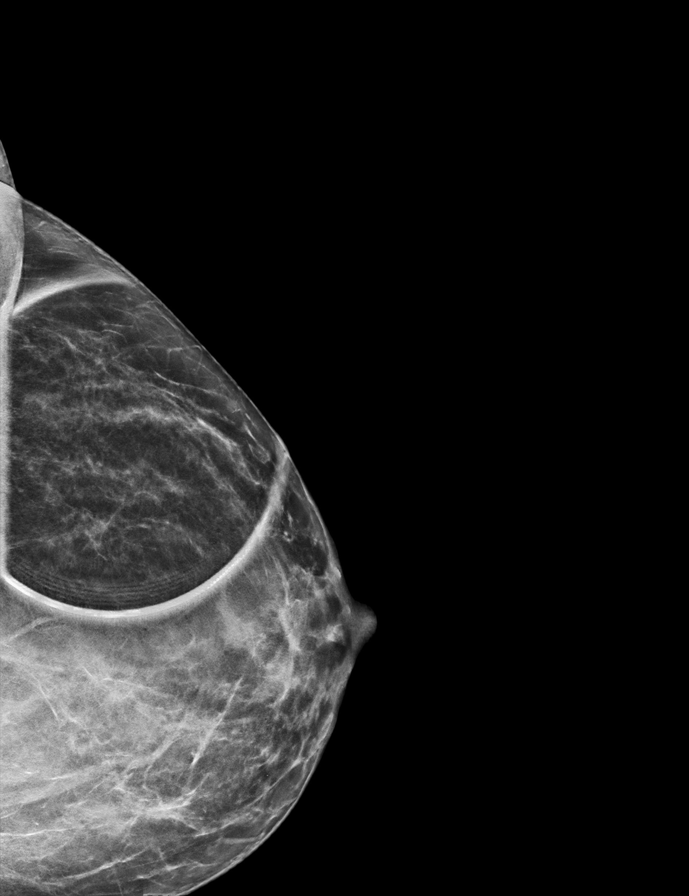

[R TAN synth-2D (2 of 2)]
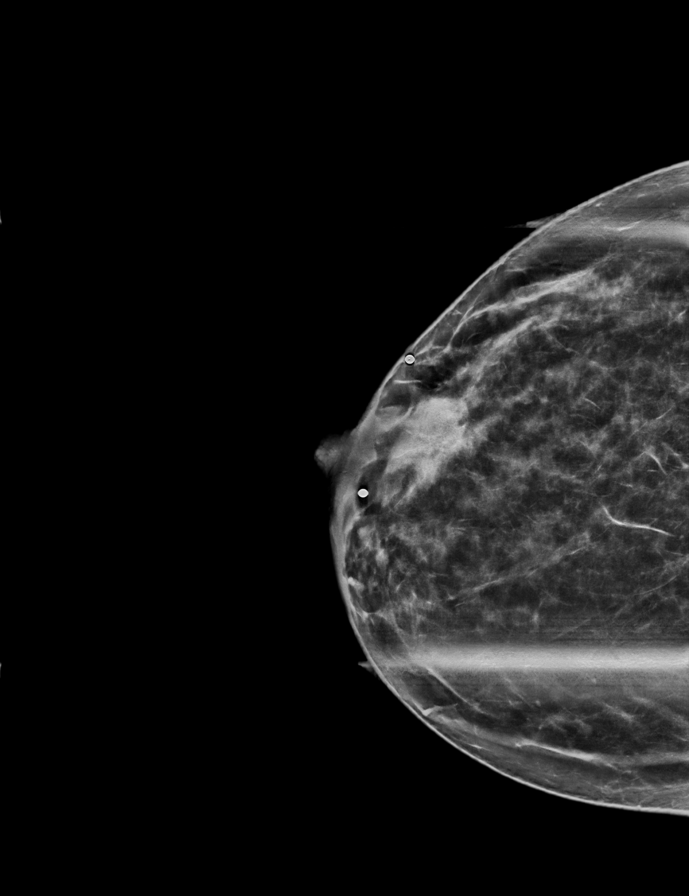

[L ML synth-2D]
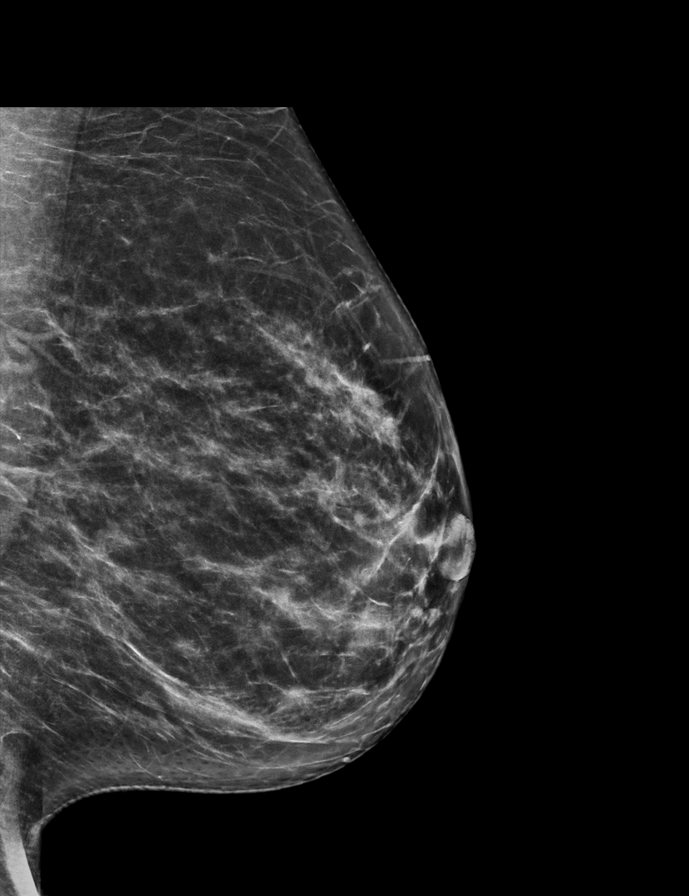

[L MLO synth-2D]
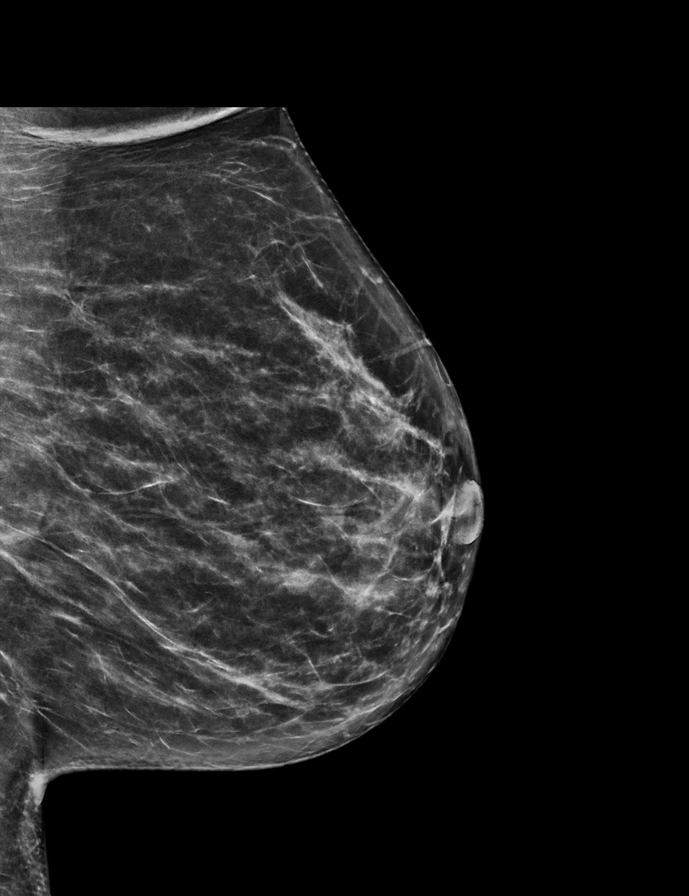

[L CC synth-2D (2 of 2)]
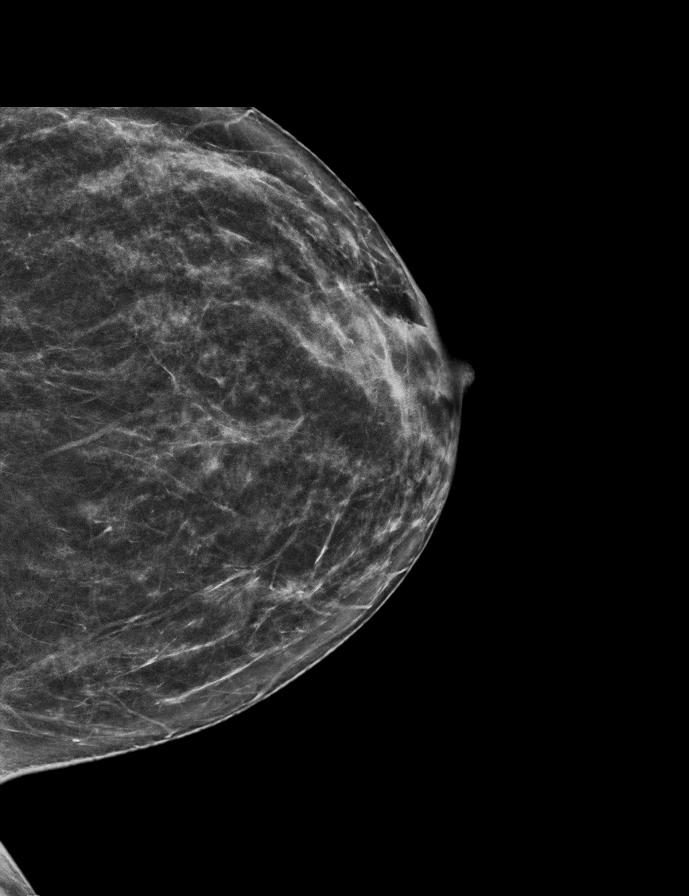

[7 of 40 positions shown; findings below may reference images not displayed]

ACR Breast Density Category c: The breast tissue is heterogeneously
dense, which may obscure small masses.
FINDINGS: BBs bracketing the palpable site of concern have been placed along
the anterolateral aspect of the right breast. There is an irregular
mass situated between these 2 markers. Tomosynthesis images also
reveals at least 1 and possibly 2 confluent areas of distortion in
the upper-outer quadrant of the right breast, with a small oval mass
extending posterior to the distortion. The palpable mass along with
the distortion and nodular appearing tissue spans approximately 8
cm. Additional nodular appearing tissue is noted in the inferior
aspect of the right breast with at least 7.5 cm of tissue involved.

Spot compression tomosynthesis images of the medial left breast
demonstrates an asymmetry/possible distortion in the middle to
anterior depth. An asymmetry in the lateral aspect of the left
breast appears to resolve on the diagnostic images. No other
suspicious calcifications, masses or areas of distortion are seen in
the bilateral breasts.

On physical exam, there is a firm broad palpable lump in the
retroareolar right breast, with firm nodular tissue extending into
the inferior and lateral breast.

Ultrasound targeted to the palpable site in the retroareolar right
breast demonstrates an irregular highly vascular mass measuring
approximately 3.8 x 1.9 x 3.6 cm.

Heterogeneous hypoechoic tissue is noted in the right breast at 6
o'clock, 5 cm from the nipple measuring 1.8 cm. At 9-10 o'clock
extending posterior to the palpable mass, and in the region of the
areas of distortion there are multiple scattered areas of
heterogeneous tissue and irregular masses. The largest is at 9
o'clock, 5 cm from the nipple spanning 2.5 cm. There is a clustered
chain of masses at 12 o'clock, 2-3 cm from the nipple spanning at
least 4.8 cm.

In the right axilla, there is 1 prominent lymph node with cortex
measuring approximately 4 mm. A second prominent lymph node has a
cortex of 3 mm, which is within normal limits for size.

Ultrasound targeted to the left breast at 3 o'clock, 4 cm from the
nipple demonstrates a hypoechoic oval circumscribed mass with
posterior acoustic shadowing measuring 0.4 x 0.3 x 0.4 cm.

No sonographic abnormalities are seen in the medial aspect of the
left breast in the area of the asymmetry identified
mammographically.

Ultrasound of the left axilla demonstrates multiple normal-appearing
lymph nodes.
IMPRESSION: 1. There is a 3.8 cm mass at the palpable site of concern in the
retroareolar right breast. There is a broad span of abnormal
tissue/scattered masses involving the inferior, lower outer and
upper outer quadrants concerning for multicentric disease.

2. There is 1 prominent right axillary lymph node with a 4 mm
cortex, and 1 other which is borderline measuring 3 mm.

3. There is an indeterminate 4 mm mass in the left breast at 3
o'clock, which overall has a benign morphology.

4. There is an indeterminate asymmetry in the medial left breast
without a sonographic correlate.

5.  No evidence of left axillary lymphadenopathy.

RECOMMENDATION:
1. Ultrasound-guided biopsy is recommended for the palpable
retroareolar right breast mass.

2. Ultrasound-guided biopsy is recommended for a distant mass in the
right breast, at the discretion of the radiologist performing the
procedure.

3. Ultrasound-guided biopsy of the prominent right axillary lymph
node.

4. Ultrasound-guided biopsy of the left breast 4 mm mass at 3
o'clock.

5.  Stereotactic biopsy of the asymmetry in the medial left breast.

Given the number of recommended biopsies, these should be performed
on 2 separate days. The right breast triple biopsy has been
scheduled for [DATE] at [DATE] a.m. The left sided biopsies have
been scheduled for [DATE] at [DATE] a.m.

Given the patient's age, breast tissue density and potential extent
of disease, consider bilateral breast MRI following the biopsies.

I have discussed the findings and recommendations with the patient.
If applicable, a reminder letter will be sent to the patient
regarding the next appointment.

BI-RADS CATEGORY  5: Highly suggestive of malignancy.

## 2021-06-19 IMAGING — US US BREAST*L* LIMITED INC AXILLA
1 series · 13 of 13 positions shown · non-contrast
Comparison: None.

CLINICAL DATA: 34-year-old female presenting for evaluation of a
palpable lump in the right breast which she noticed about 2 weeks
ago.



[Series 1: us breast*left* limited inc axilla · 0.06mm/px · 13 of 13 slices shown]
[im 1/13]
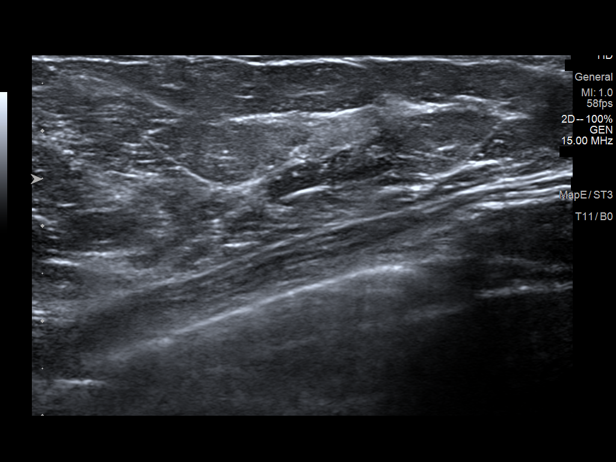
[im 2/13]
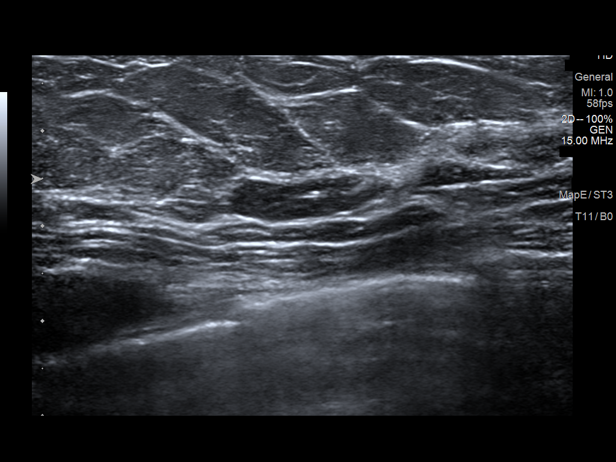
[im 3/13]
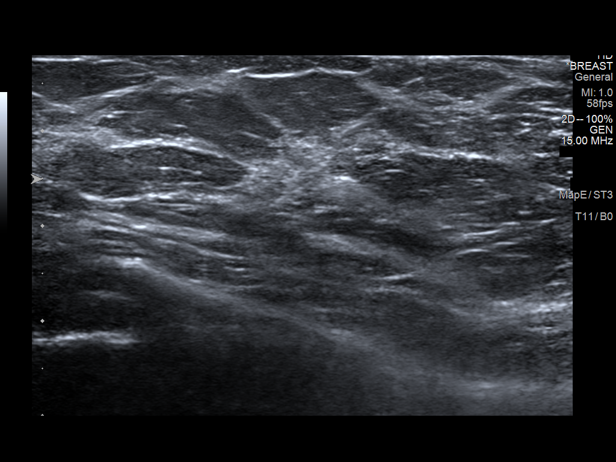
[im 4/13]
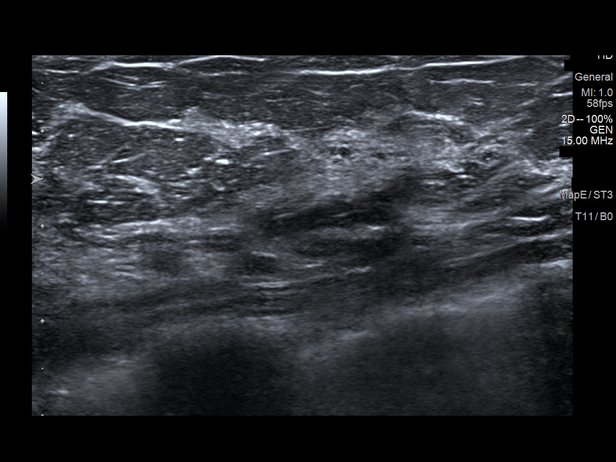
[im 5/13]
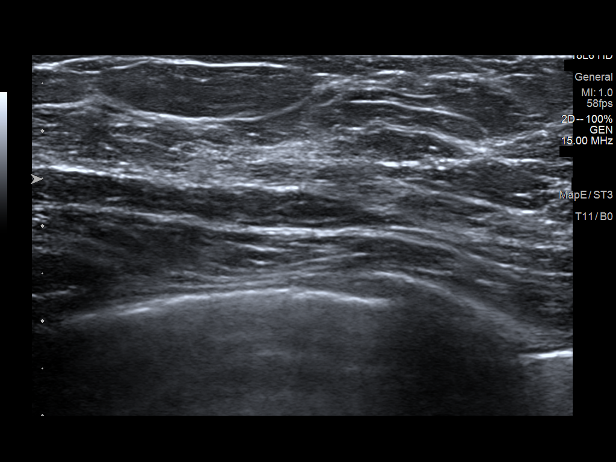
[im 6/13]
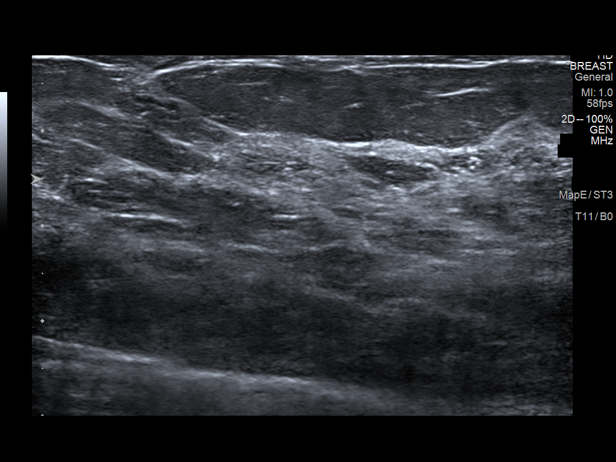
[im 7/13]
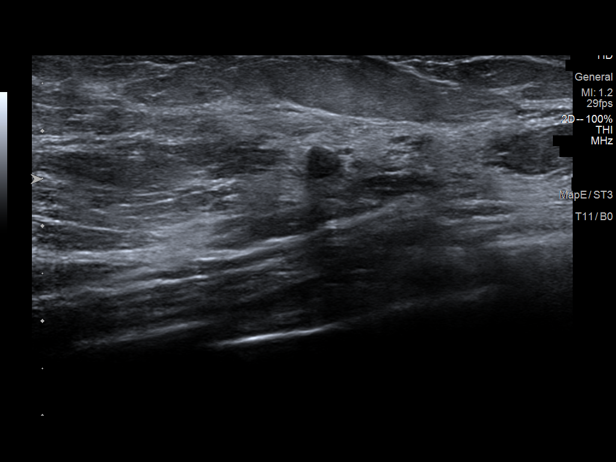
[im 8/13]
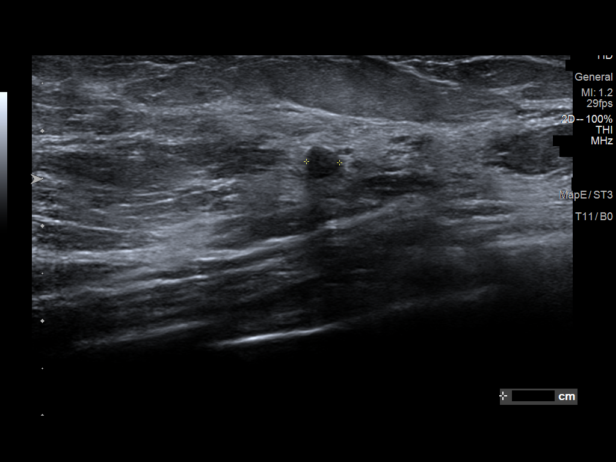
[im 9/13]
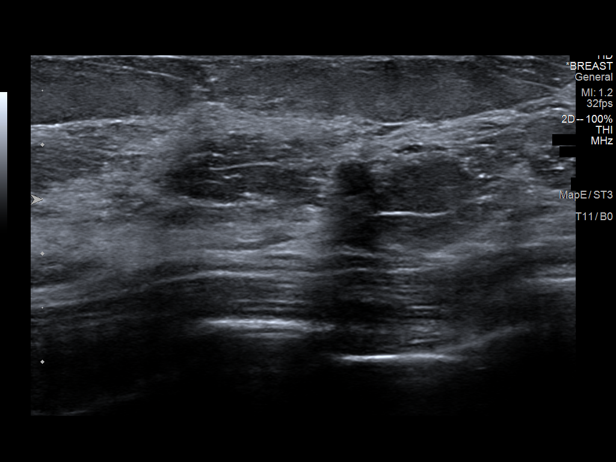
[im 10/13]
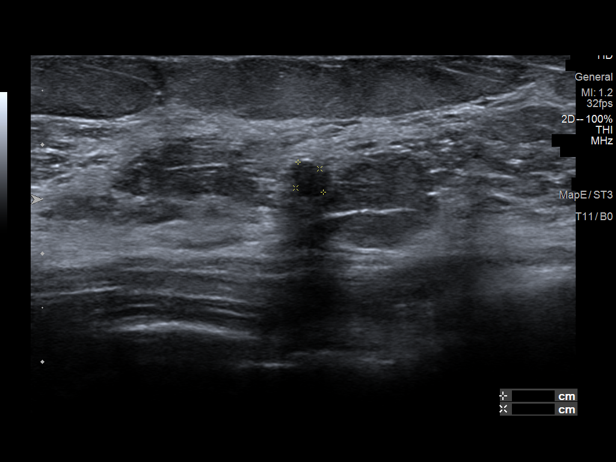
[im 11/13]
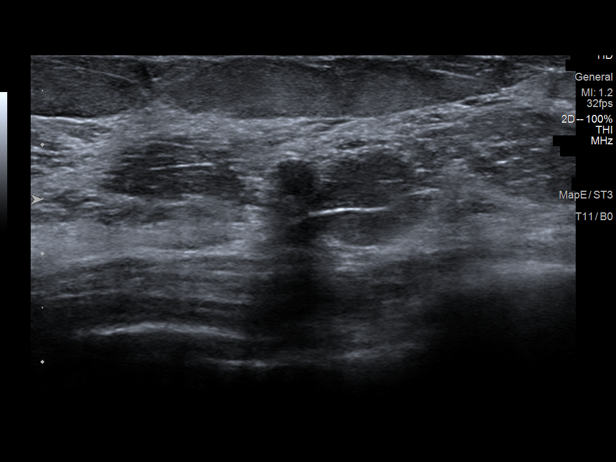
[im 12/13]
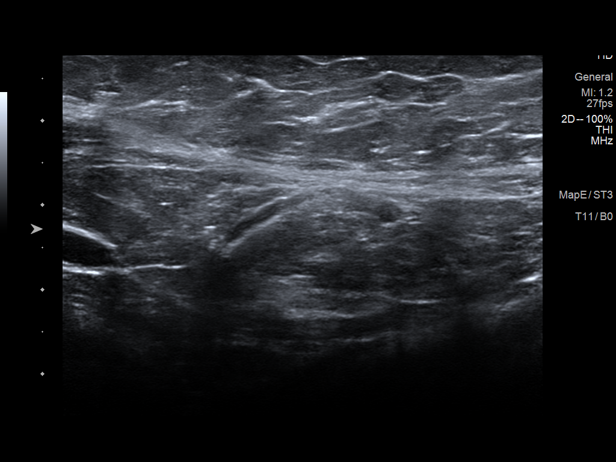
[im 13/13]
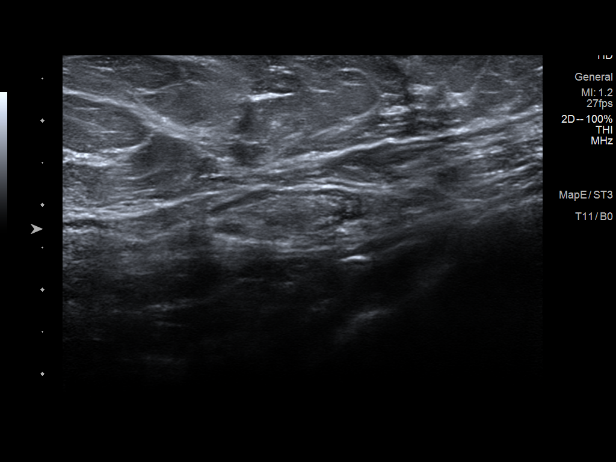

[13 of 13 positions shown; findings below may reference images not displayed]

ACR Breast Density Category c: The breast tissue is heterogeneously
dense, which may obscure small masses.
FINDINGS: BBs bracketing the palpable site of concern have been placed along
the anterolateral aspect of the right breast. There is an irregular
mass situated between these 2 markers. Tomosynthesis images also
reveals at least 1 and possibly 2 confluent areas of distortion in
the upper-outer quadrant of the right breast, with a small oval mass
extending posterior to the distortion. The palpable mass along with
the distortion and nodular appearing tissue spans approximately 8
cm. Additional nodular appearing tissue is noted in the inferior
aspect of the right breast with at least 7.5 cm of tissue involved.

Spot compression tomosynthesis images of the medial left breast
demonstrates an asymmetry/possible distortion in the middle to
anterior depth. An asymmetry in the lateral aspect of the left
breast appears to resolve on the diagnostic images. No other
suspicious calcifications, masses or areas of distortion are seen in
the bilateral breasts.

On physical exam, there is a firm broad palpable lump in the
retroareolar right breast, with firm nodular tissue extending into
the inferior and lateral breast.

Ultrasound targeted to the palpable site in the retroareolar right
breast demonstrates an irregular highly vascular mass measuring
approximately 3.8 x 1.9 x 3.6 cm.

Heterogeneous hypoechoic tissue is noted in the right breast at 6
o'clock, 5 cm from the nipple measuring 1.8 cm. At 9-10 o'clock
extending posterior to the palpable mass, and in the region of the
areas of distortion there are multiple scattered areas of
heterogeneous tissue and irregular masses. The largest is at 9
o'clock, 5 cm from the nipple spanning 2.5 cm. There is a clustered
chain of masses at 12 o'clock, 2-3 cm from the nipple spanning at
least 4.8 cm.

In the right axilla, there is 1 prominent lymph node with cortex
measuring approximately 4 mm. A second prominent lymph node has a
cortex of 3 mm, which is within normal limits for size.

Ultrasound targeted to the left breast at 3 o'clock, 4 cm from the
nipple demonstrates a hypoechoic oval circumscribed mass with
posterior acoustic shadowing measuring 0.4 x 0.3 x 0.4 cm.

No sonographic abnormalities are seen in the medial aspect of the
left breast in the area of the asymmetry identified
mammographically.

Ultrasound of the left axilla demonstrates multiple normal-appearing
lymph nodes.
IMPRESSION: 1. There is a 3.8 cm mass at the palpable site of concern in the
retroareolar right breast. There is a broad span of abnormal
tissue/scattered masses involving the inferior, lower outer and
upper outer quadrants concerning for multicentric disease.

2. There is 1 prominent right axillary lymph node with a 4 mm
cortex, and 1 other which is borderline measuring 3 mm.

3. There is an indeterminate 4 mm mass in the left breast at 3
o'clock, which overall has a benign morphology.

4. There is an indeterminate asymmetry in the medial left breast
without a sonographic correlate.

5.  No evidence of left axillary lymphadenopathy.

RECOMMENDATION:
1. Ultrasound-guided biopsy is recommended for the palpable
retroareolar right breast mass.

2. Ultrasound-guided biopsy is recommended for a distant mass in the
right breast, at the discretion of the radiologist performing the
procedure.

3. Ultrasound-guided biopsy of the prominent right axillary lymph
node.

4. Ultrasound-guided biopsy of the left breast 4 mm mass at 3
o'clock.

5.  Stereotactic biopsy of the asymmetry in the medial left breast.

Given the number of recommended biopsies, these should be performed
on 2 separate days. The right breast triple biopsy has been
scheduled for [DATE] at [DATE] a.m. The left sided biopsies have
been scheduled for [DATE] at [DATE] a.m.

Given the patient's age, breast tissue density and potential extent
of disease, consider bilateral breast MRI following the biopsies.

I have discussed the findings and recommendations with the patient.
If applicable, a reminder letter will be sent to the patient
regarding the next appointment.

BI-RADS CATEGORY  5: Highly suggestive of malignancy.

## 2021-06-23 ENCOUNTER — Telehealth: Payer: Self-pay | Admitting: Hematology and Oncology

## 2021-06-23 ENCOUNTER — Encounter: Payer: Self-pay | Admitting: *Deleted

## 2021-06-23 ENCOUNTER — Ambulatory Visit
Admission: RE | Admit: 2021-06-23 | Discharge: 2021-06-23 | Disposition: A | Discharge status: Home / Self Care | Payer: BC Managed Care – PPO | Source: Ambulatory Visit | Attending: Obstetrics and Gynecology | Admitting: Obstetrics and Gynecology

## 2021-06-23 ENCOUNTER — Other Ambulatory Visit: Payer: Self-pay | Admitting: Obstetrics and Gynecology

## 2021-06-23 ENCOUNTER — Telehealth: Payer: Self-pay | Admitting: *Deleted

## 2021-06-23 ENCOUNTER — Other Ambulatory Visit: Payer: Self-pay | Admitting: *Deleted

## 2021-06-23 ENCOUNTER — Other Ambulatory Visit: Payer: Self-pay

## 2021-06-23 DIAGNOSIS — N6310 Unspecified lump in the right breast, unspecified quadrant: Secondary | ICD-10-CM

## 2021-06-23 DIAGNOSIS — N63 Unspecified lump in unspecified breast: Secondary | ICD-10-CM

## 2021-06-23 DIAGNOSIS — R599 Enlarged lymph nodes, unspecified: Secondary | ICD-10-CM

## 2021-06-23 IMAGING — MG MM BREAST LOCALIZATION CLIP
8 series · 8 of 24 positions shown · non-contrast
Comparison: Previous exam(s).

CLINICAL DATA: Patient status post ultrasound-guided core needle
biopsy 2 right breast masses and right axillary lymph node

EXAM:
3D DIAGNOSTIC RIGHT MAMMOGRAM POST ULTRASOUND BIOPSY

[R XCCL synth-2D]
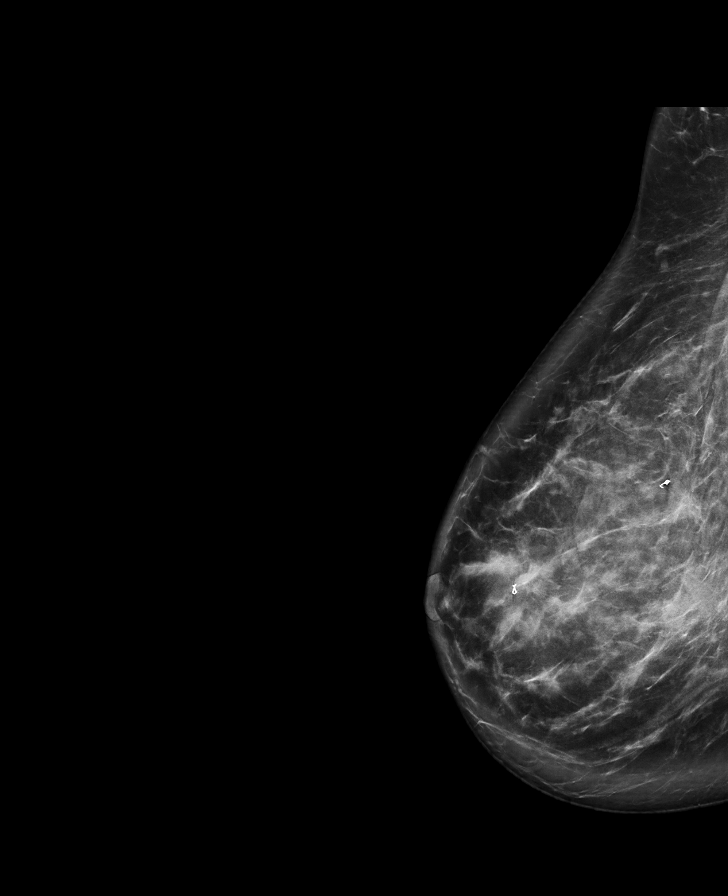

[R CC synth-2D]
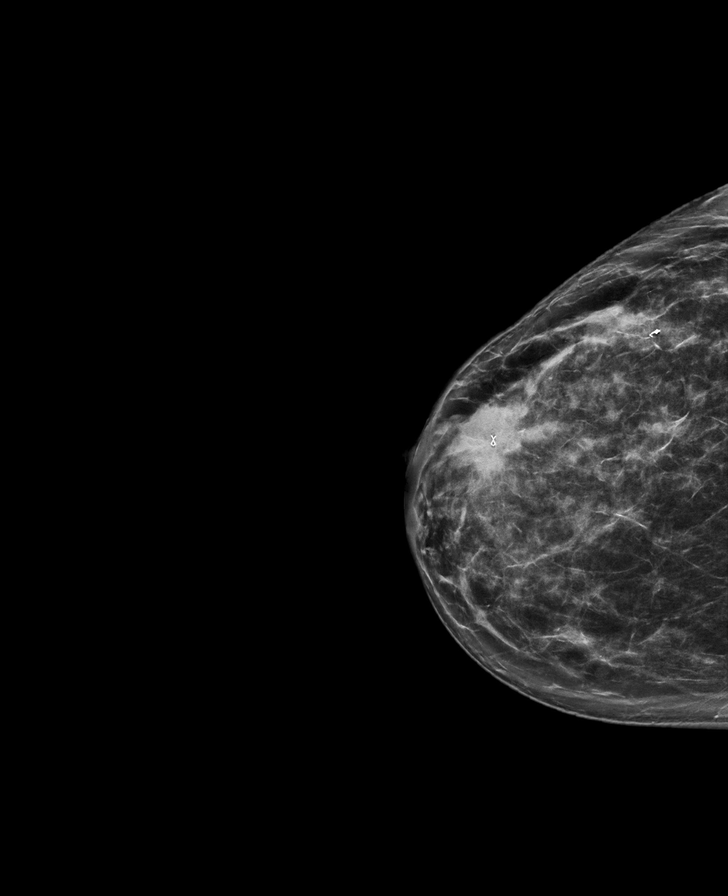

[R ML synth-2D (1 of 2)]
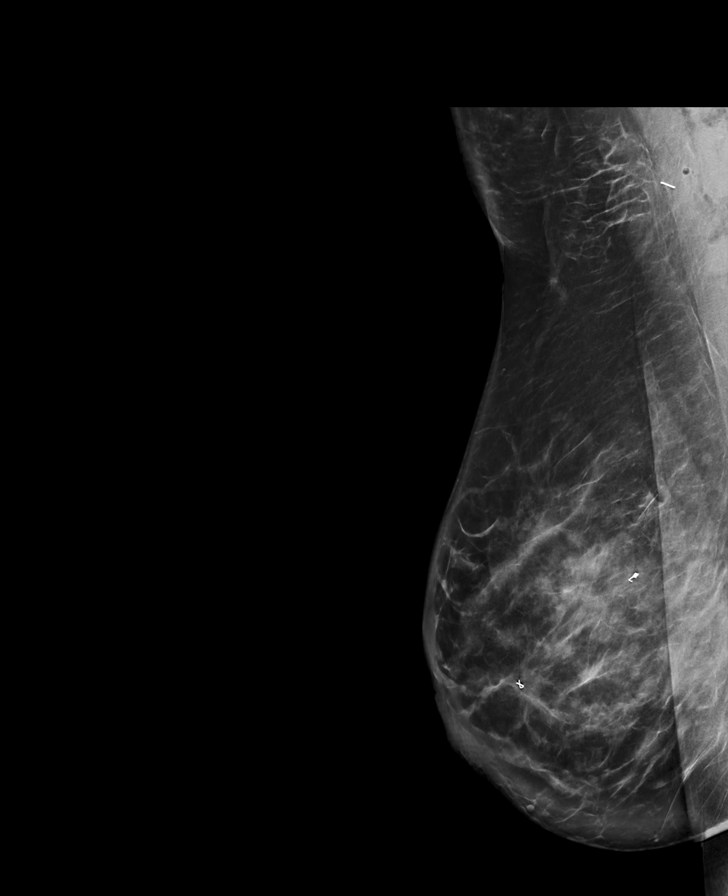

[R ML synth-2D (2 of 2)]
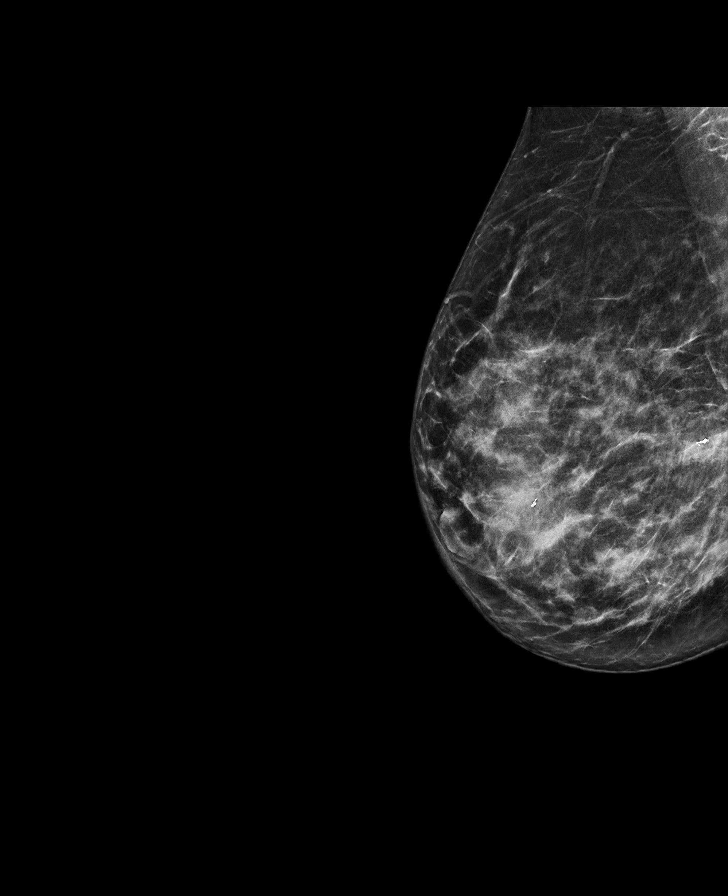

[R ML tomo (1 of 2) · tomo slice 39/76.0]
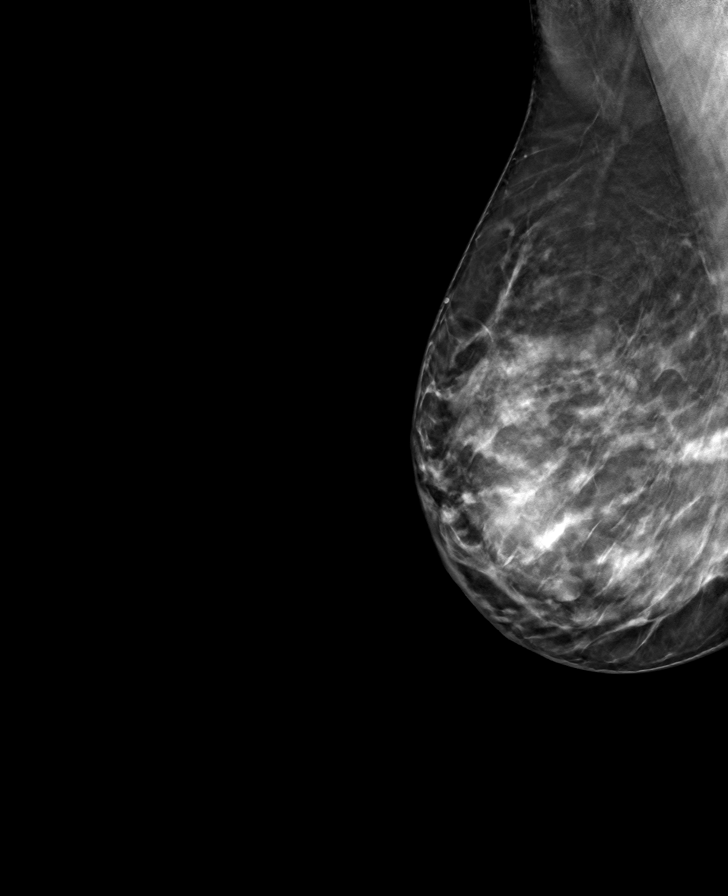

[R CC tomo · tomo slice 39/76.0]
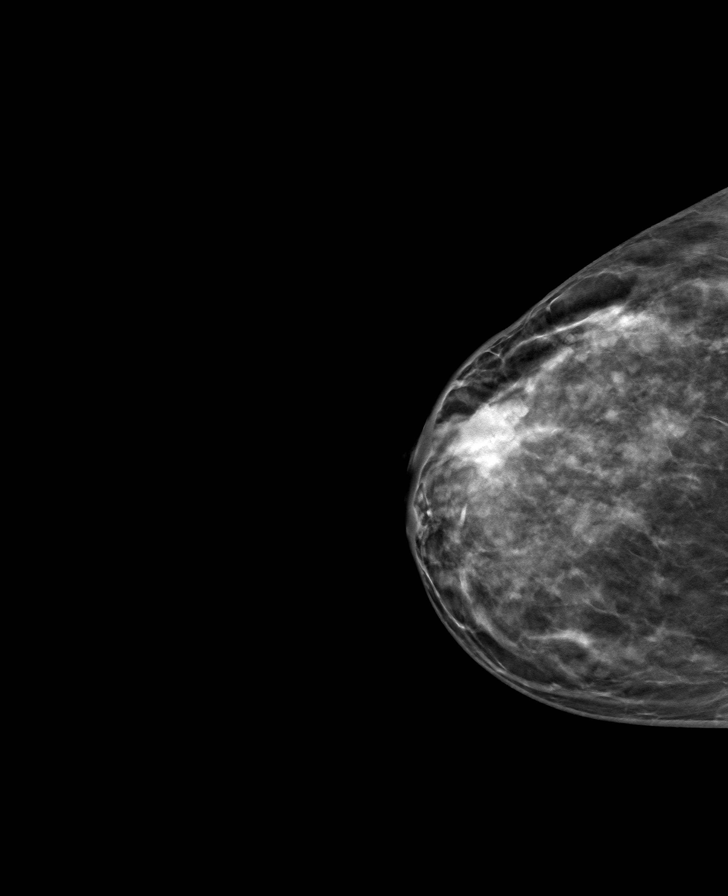

[R XCCL tomo · tomo slice 48/95.0]
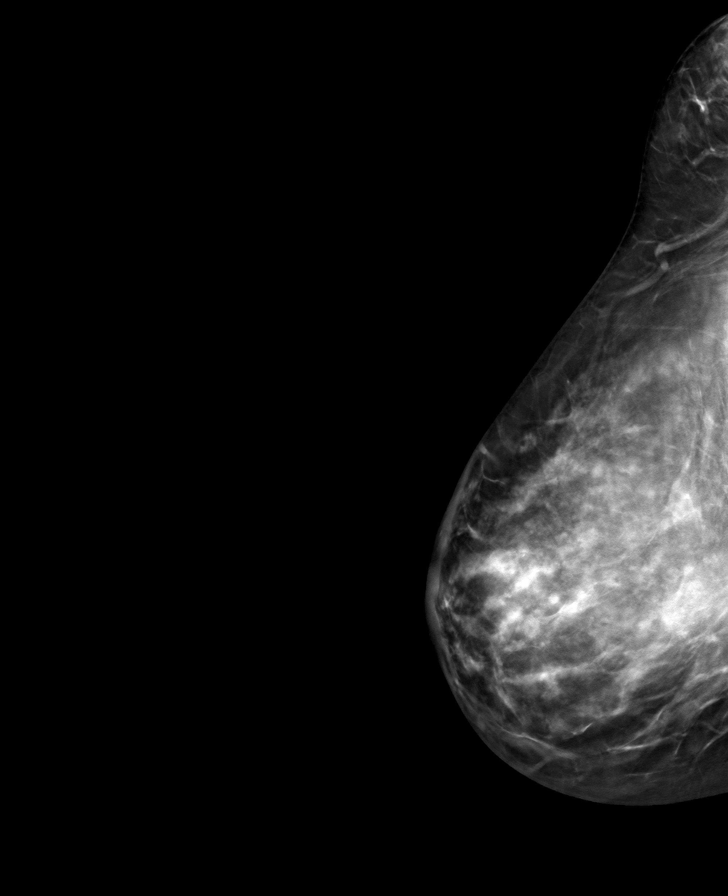

[R ML tomo (2 of 2) · tomo slice 51/102.0]
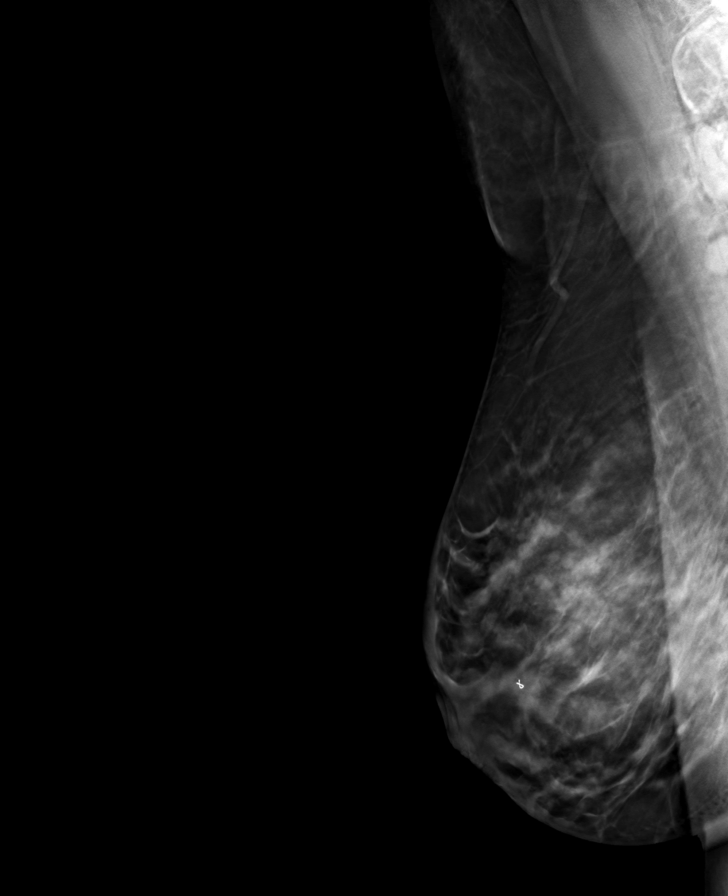

[8 of 24 positions shown; findings below may reference images not displayed]

FINDINGS: 3D Mammographic images were obtained following ultrasound guided
biopsy of 2 right breast masses and right axillary lymph node.

Site 1: Right breast mass retroareolar: Ribbon shaped clip: In
appropriate position.

Site 2: Right breast mass 9 o'clock position: Coil shaped clip: In
appropriate position.

Site 3: Right axillary lymph node: ABO NADA clip: In appropriate
position.
IMPRESSION: Appropriate positioning of the biopsy marking clips as above

Final Assessment: Post Procedure Mammograms for Marker Placement

## 2021-06-23 NOTE — Telephone Encounter (Signed)
Scheduled appt per secure chat with RN Dawn. Left msg for pt with appt date and time as well as location. Also left phone number for pt to call back with any questions or concerns.

## 2021-06-23 NOTE — Telephone Encounter (Signed)
Received referral for new breast cancer work up. Spoke to Jenna Cross and provided new Jenna Cross appt to see Dr. Lindi Adie on 9/22 at 1pm. Provided directions and instructions as well as navigation resources and contact information.  Reached out to Dr. Cristal Generous office for appt to discuss surgical treatment plan

## 2021-06-26 ENCOUNTER — Telehealth: Payer: Self-pay | Admitting: *Deleted

## 2021-06-26 ENCOUNTER — Other Ambulatory Visit: Payer: BC Managed Care – PPO

## 2021-06-26 NOTE — Telephone Encounter (Signed)
Provided appointment with Dr. Donne Hazel on 9/29 arrive at 4pm. Confirmed appt date.

## 2021-06-27 ENCOUNTER — Other Ambulatory Visit: Payer: Self-pay

## 2021-06-27 ENCOUNTER — Other Ambulatory Visit: Payer: Self-pay | Admitting: Obstetrics and Gynecology

## 2021-06-27 ENCOUNTER — Ambulatory Visit
Admission: RE | Admit: 2021-06-27 | Discharge: 2021-06-27 | Disposition: A | Discharge status: Home / Self Care | Payer: BC Managed Care – PPO | Source: Ambulatory Visit | Attending: Obstetrics and Gynecology | Admitting: Obstetrics and Gynecology

## 2021-06-27 ENCOUNTER — Other Ambulatory Visit: Payer: Self-pay | Admitting: *Deleted

## 2021-06-27 ENCOUNTER — Telehealth: Payer: Self-pay | Admitting: *Deleted

## 2021-06-27 DIAGNOSIS — N6489 Other specified disorders of breast: Secondary | ICD-10-CM

## 2021-06-27 DIAGNOSIS — C50411 Malignant neoplasm of upper-outer quadrant of right female breast: Secondary | ICD-10-CM | POA: Insufficient documentation

## 2021-06-27 DIAGNOSIS — Z170 Estrogen receptor positive status [ER+]: Secondary | ICD-10-CM

## 2021-06-27 DIAGNOSIS — N6320 Unspecified lump in the left breast, unspecified quadrant: Secondary | ICD-10-CM

## 2021-06-27 IMAGING — MG MM BREAST BX W LOC DEV 1ST LESION IMAGE BX SPEC STEREO GUIDE*L*
6 of 11 series · 6 of 19 positions shown · non-contrast
Comparison: Previous exam(s).
COMPARISON: Previous exam(s).
COMPARISON: Previous exam(s).

Addendum:
CLINICAL DATA: Patient presents for ultrasound-guided core needle
biopsy a 4 mm mass in the 3 o'clock position of the left breast as
well as stereotactic guided biopsy of a medial left breast
asymmetry. She has recently diagnosed multicentric right breast
carcinoma as well as a metastatic right axillary lymph node.

EXAM:
ULTRASOUND GUIDED LEFT BREAST CORE NEEDLE BIOPSY
STEREOTACTIC GUIDED LEFT BREAST CORE NEEDLE BIOPSY

[L (1 of 6)]
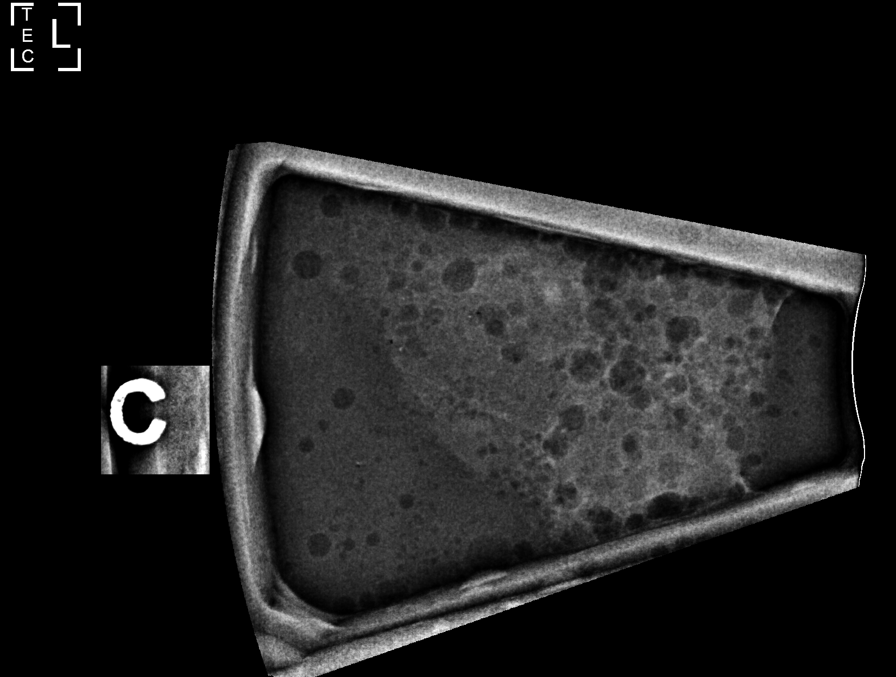

[L (2 of 6)]
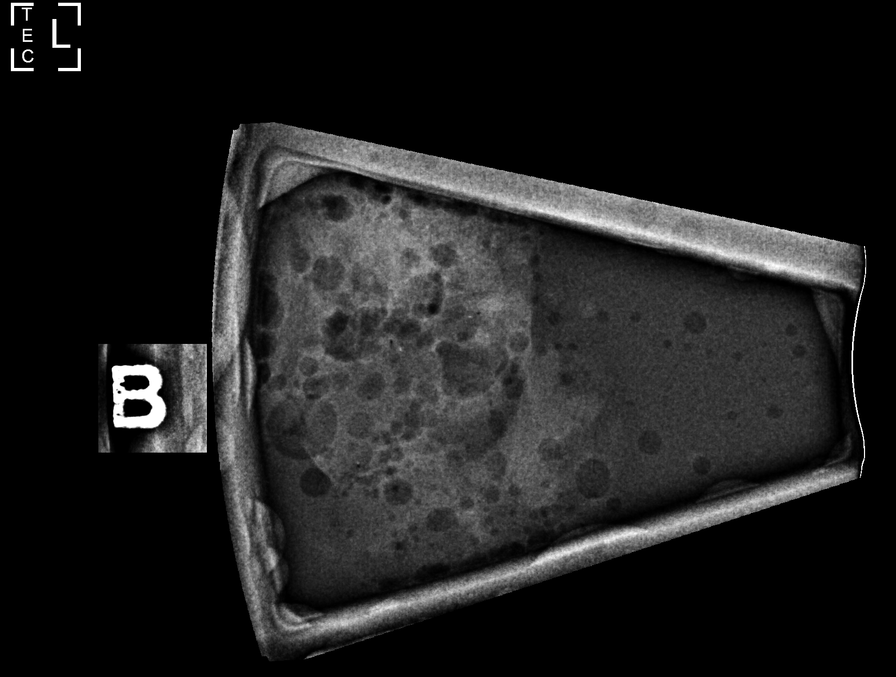

[L (3 of 6)]
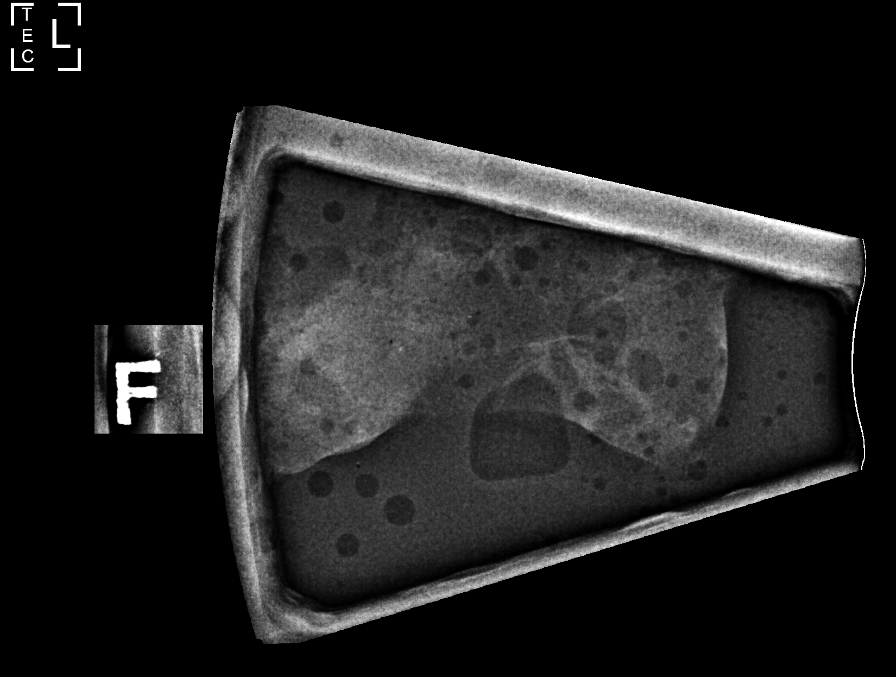

[L (4 of 6)]
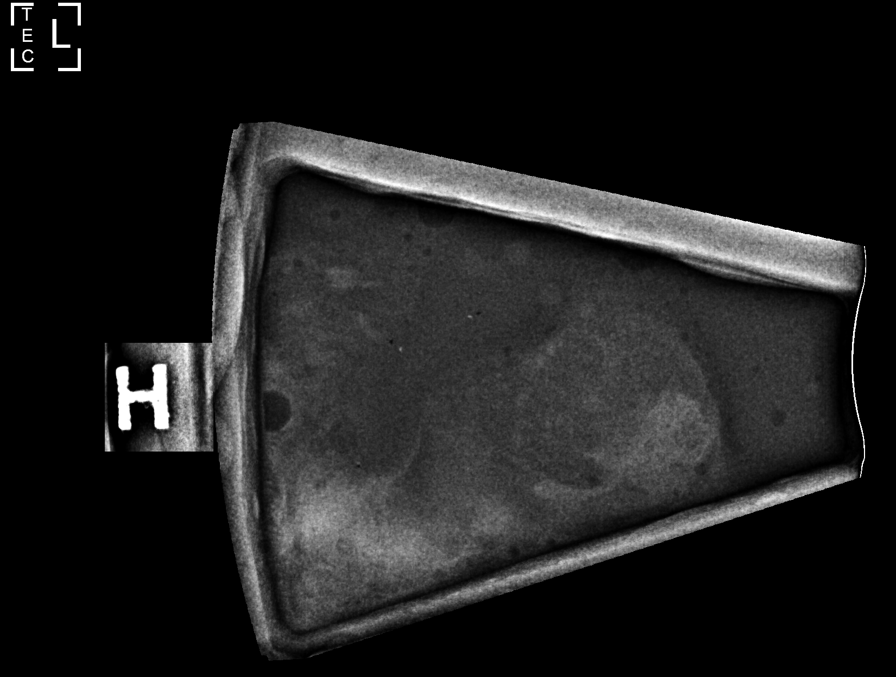

[L (5 of 6)]
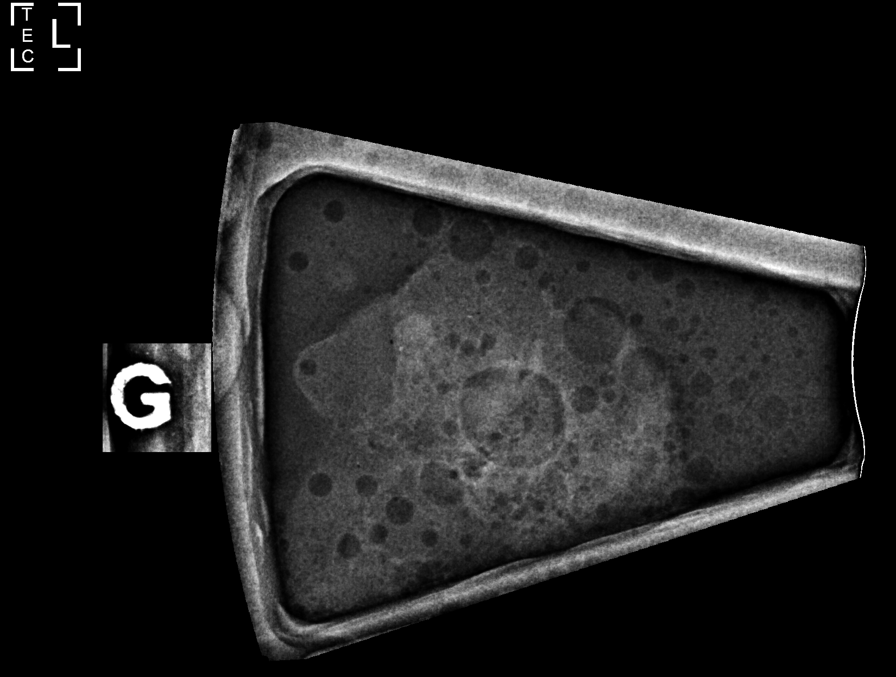

[L (6 of 6)]
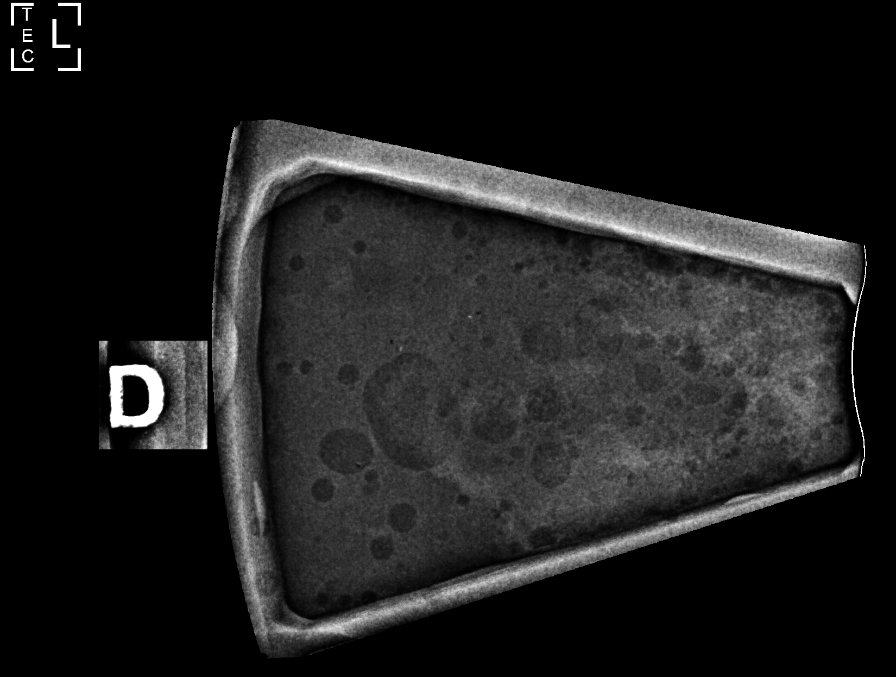

[6 of 19 positions shown; findings below may reference images not displayed]

PROCEDURE:
Lesion #1: 4 mm mass, 3 o'clock, 4 cmfn.

I met with the patient and we discussed the procedure of
ultrasound-guided biopsy, including benefits and alternatives. We
discussed the high likelihood of a successful procedure. We
discussed the risks of the procedure, including infection, bleeding,
tissue injury, clip migration, and inadequate sampling. Informed
written consent was given. The usual time-out protocol was performed
immediately prior to the procedure.

Lesion quadrant: Upper outer quadrant: Near 3 o'clock.

Using sterile technique and 1% Lidocaine as local anesthetic, under
direct ultrasound visualization, a 12 gauge KALETSI device was
used to perform biopsy of the 4 mm mass using an inferior approach.
At the conclusion of the procedure ribbon shaped tissue marker clip
was deployed into the biopsy cavity.

Lesion #2: Asymmetry, medial left breast.

The patient and I discussed the procedure of stereotactic-guided
biopsy including benefits and alternatives. We discussed the high
likelihood of a successful procedure. We discussed the risks of the
procedure including infection, bleeding, tissue injury, clip
migration, and inadequate sampling. Informed written consent was
given. The usual time out protocol was performed immediately prior
to the procedure.

Using sterile technique and 1% Lidocaine as local anesthetic, under
stereotactic guidance, a 9 gauge vacuum assisted device was used to
perform core needle biopsy of the asymmetry in the medial left
breast using a superior approach.

Lesion quadrant: Lower inner quadrant: Near 9 o'clock.

At the conclusion of the procedure, an X shaped shaped tissue marker
clip was deployed into the biopsy cavity.

Follow up 2 view mammogram was performed and dictated separately.
IMPRESSION: Ultrasound guided biopsy of a 3 o'clock position left breast mass.

Stereotactic guided biopsy of a medial left breast asymmetry.

No apparent complications.

ADDENDUM:
At the conclusion of the ultrasound procedure, the patient was
inadvertently punctured in the left leg with the trocar from the
core needle biopsy device introducer. The trocar had only touched
the patient, and was otherwise sterile. There is no exposure risk to
the patient. The small puncture wound was cleaned and dressed with a
Band-Aid. She was told to return with any symptoms infection.

ADDENDUM:
Pathology revealed FIBROADENOMA of the LEFT breast, 3 o'clock,
[WN], (ribbon clip). This was found to be concordant by Dr. KALETSI
KALETSI.

Pathology revealed SMALL FIBROADENOMA of the LEFT breast, medial, (x
clip). This was found to be concordant by Dr. KALETSI.

Pathology results were discussed with the patient by telephone. The
patient reported doing well after the biopsies with tenderness at
the sites. Post biopsy instructions and care were reviewed and
questions were answered. The patient was encouraged to call The
direct phone number was provided.

The patient has a recent diagnosis of RIGHT breast cancer and should
follow her outlined treatment plan.

The patient is scheduled for a BILATERAL breast MRI on [DATE].

Pathology results reported by KALETSI, RN on [DATE].

*** End of Addendum ***
Addendum:
PROCEDURE:
Lesion #1: 4 mm mass, 3 o'clock, 4 cmfn.

I met with the patient and we discussed the procedure of
ultrasound-guided biopsy, including benefits and alternatives. We
discussed the high likelihood of a successful procedure. We
discussed the risks of the procedure, including infection, bleeding,
tissue injury, clip migration, and inadequate sampling. Informed
written consent was given. The usual time-out protocol was performed
immediately prior to the procedure.

Lesion quadrant: Upper outer quadrant: Near 3 o'clock.

Using sterile technique and 1% Lidocaine as local anesthetic, under
direct ultrasound visualization, a 12 gauge KALETSI device was
used to perform biopsy of the 4 mm mass using an inferior approach.
At the conclusion of the procedure ribbon shaped tissue marker clip
was deployed into the biopsy cavity.

Lesion #2: Asymmetry, medial left breast.

The patient and I discussed the procedure of stereotactic-guided
biopsy including benefits and alternatives. We discussed the high
likelihood of a successful procedure. We discussed the risks of the
procedure including infection, bleeding, tissue injury, clip
migration, and inadequate sampling. Informed written consent was
given. The usual time out protocol was performed immediately prior
to the procedure.

Using sterile technique and 1% Lidocaine as local anesthetic, under
stereotactic guidance, a 9 gauge vacuum assisted device was used to
perform core needle biopsy of the asymmetry in the medial left
breast using a superior approach.

Lesion quadrant: Lower inner quadrant: Near 9 o'clock.

At the conclusion of the procedure, an X shaped shaped tissue marker
clip was deployed into the biopsy cavity.

Follow up 2 view mammogram was performed and dictated separately.
IMPRESSION: Ultrasound guided biopsy of a 3 o'clock position left breast mass.

Stereotactic guided biopsy of a medial left breast asymmetry.

No apparent complications.

ADDENDUM:
At the conclusion of the ultrasound procedure, the patient was
inadvertently punctured in the left leg with the trocar from the
core needle biopsy device introducer. The trocar had only touched
the patient, and was otherwise sterile. There is no exposure risk to
the patient. The small puncture wound was cleaned and dressed with a
Band-Aid. She was told to return with any symptoms infection.

*** End of Addendum ***
PROCEDURE:
Lesion #1: 4 mm mass, 3 o'clock, 4 cmfn.

I met with the patient and we discussed the procedure of
ultrasound-guided biopsy, including benefits and alternatives. We
discussed the high likelihood of a successful procedure. We
discussed the risks of the procedure, including infection, bleeding,
tissue injury, clip migration, and inadequate sampling. Informed
written consent was given. The usual time-out protocol was performed
immediately prior to the procedure.

Lesion quadrant: Upper outer quadrant: Near 3 o'clock.

Using sterile technique and 1% Lidocaine as local anesthetic, under
direct ultrasound visualization, a 12 gauge KALETSI device was
used to perform biopsy of the 4 mm mass using an inferior approach.
At the conclusion of the procedure ribbon shaped tissue marker clip
was deployed into the biopsy cavity.

Lesion #2: Asymmetry, medial left breast.

The patient and I discussed the procedure of stereotactic-guided
biopsy including benefits and alternatives. We discussed the high
likelihood of a successful procedure. We discussed the risks of the
procedure including infection, bleeding, tissue injury, clip
migration, and inadequate sampling. Informed written consent was
given. The usual time out protocol was performed immediately prior
to the procedure.

Using sterile technique and 1% Lidocaine as local anesthetic, under
stereotactic guidance, a 9 gauge vacuum assisted device was used to
perform core needle biopsy of the asymmetry in the medial left
breast using a superior approach.

Lesion quadrant: Lower inner quadrant: Near 9 o'clock.

At the conclusion of the procedure, an X shaped shaped tissue marker
clip was deployed into the biopsy cavity.

Follow up 2 view mammogram was performed and dictated separately.
IMPRESSION: Ultrasound guided biopsy of a 3 o'clock position left breast mass.

Stereotactic guided biopsy of a medial left breast asymmetry.

No apparent complications.

## 2021-06-27 IMAGING — US US BREAST BX W LOC DEV 1ST LESION IMG BX SPEC US GUIDE*L*
1 series · 13 of 15 positions shown · non-contrast
Comparison: Previous exam(s).
COMPARISON: Previous exam(s).
COMPARISON: Previous exam(s).

Addendum:
CLINICAL DATA: Patient presents for ultrasound-guided core needle
biopsy a 4 mm mass in the 3 o'clock position of the left breast as
well as stereotactic guided biopsy of a medial left breast
asymmetry. She has recently diagnosed multicentric right breast
carcinoma as well as a metastatic right axillary lymph node.

EXAM:
ULTRASOUND GUIDED LEFT BREAST CORE NEEDLE BIOPSY
STEREOTACTIC GUIDED LEFT BREAST CORE NEEDLE BIOPSY

[Series 1: us breast bx w loc dev 1st lesion img bx spec us g · 0.05mm/px · 13 of 15 slices shown]
[im 1/15]
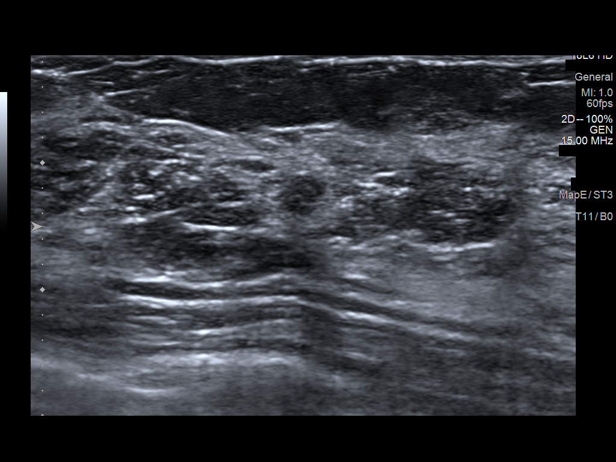
[im 2/15]
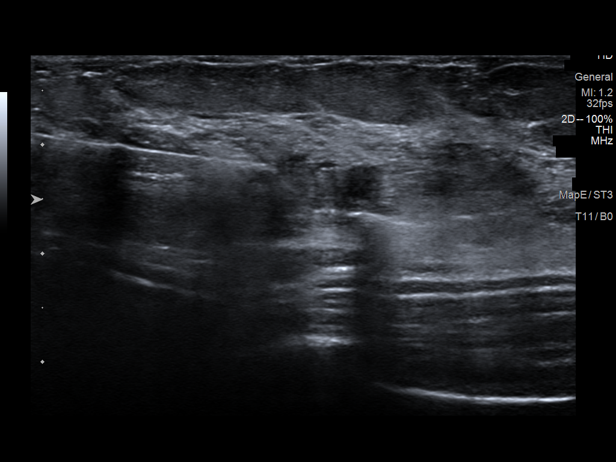
[im 3/15]
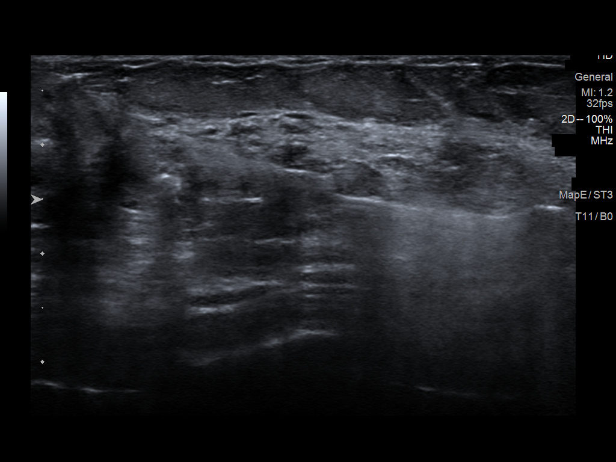
[im 5/15]
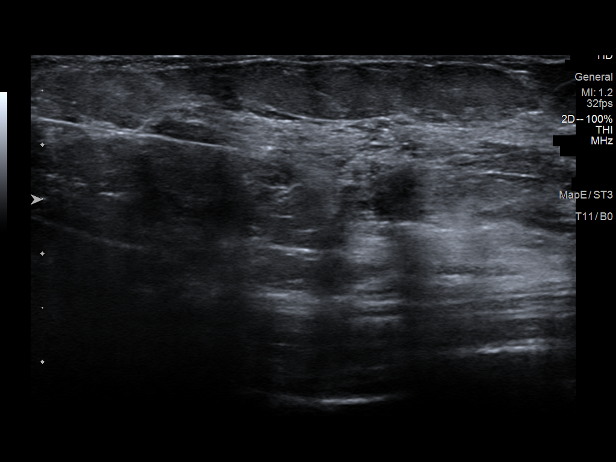
[im 6/15]
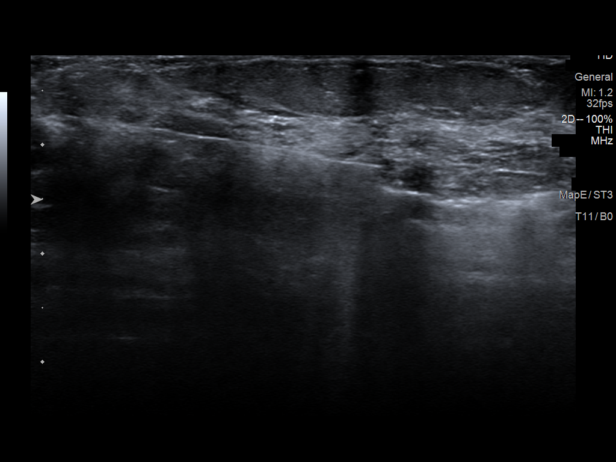
[im 7/15]
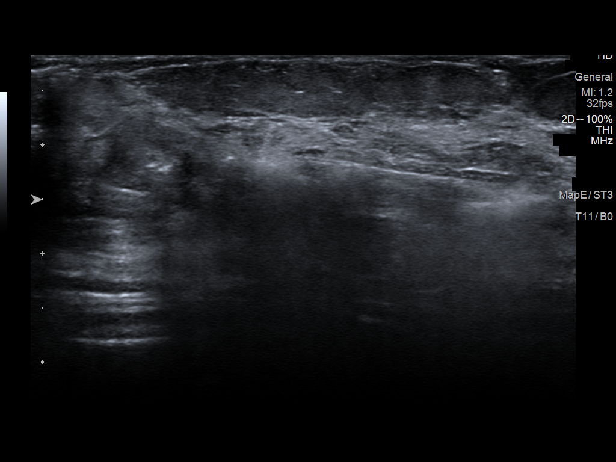
[im 8/15]
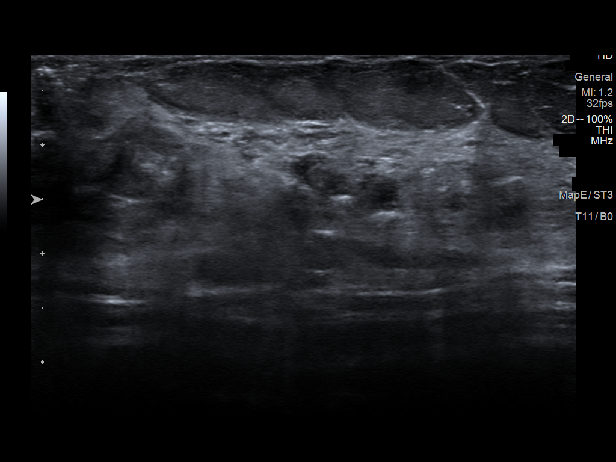
[im 9/15]
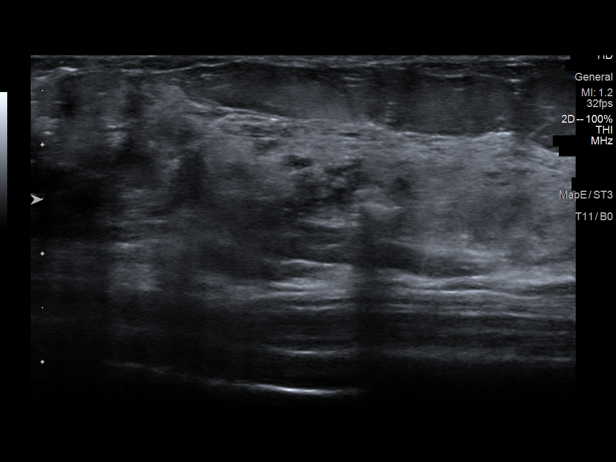
[im 10/15]
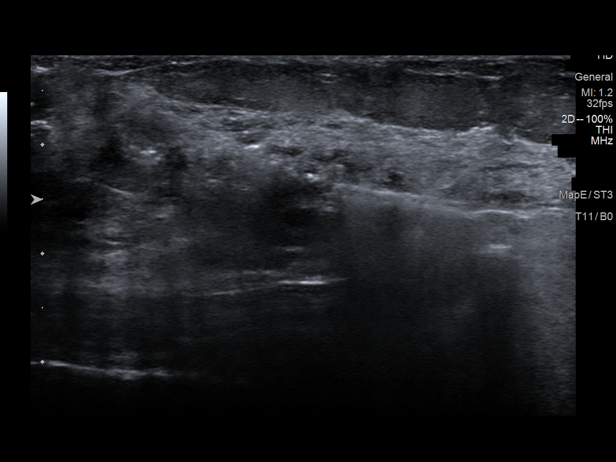
[im 11/15]
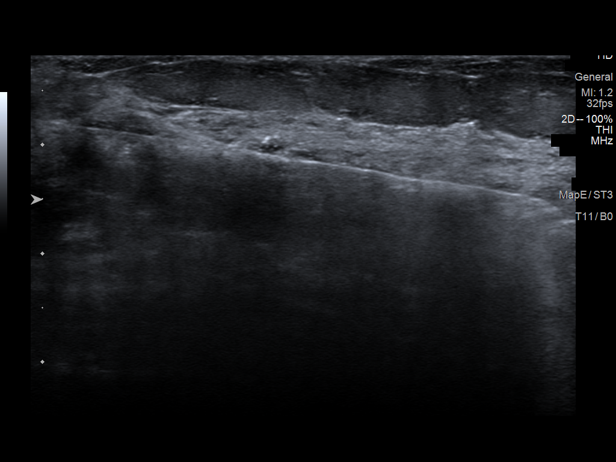
[im 13/15]
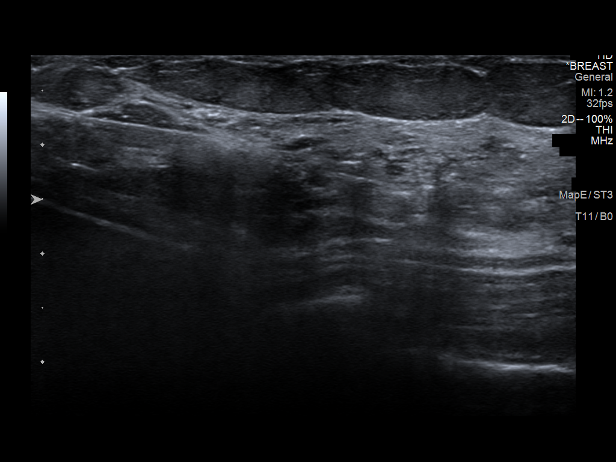
[im 14/15]
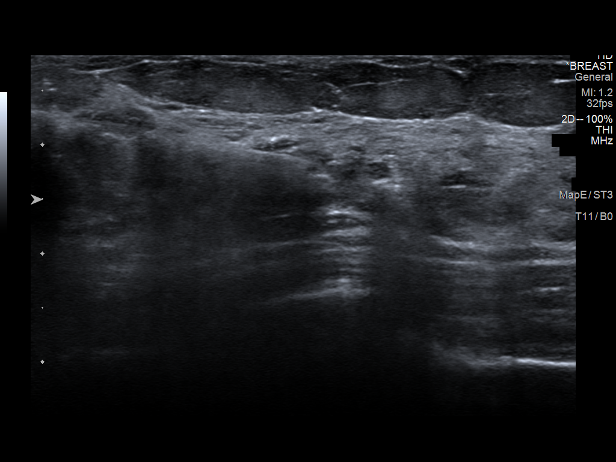
[im 15/15]
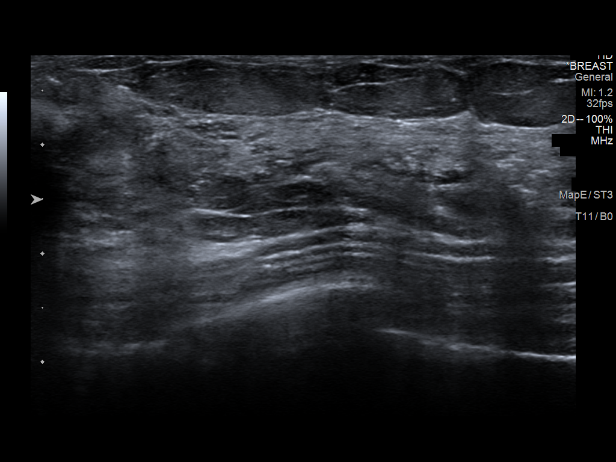

[13 of 15 positions shown; findings below may reference images not displayed]

PROCEDURE:
Lesion #1: 4 mm mass, 3 o'clock, 4 cmfn.

I met with the patient and we discussed the procedure of
ultrasound-guided biopsy, including benefits and alternatives. We
discussed the high likelihood of a successful procedure. We
discussed the risks of the procedure, including infection, bleeding,
tissue injury, clip migration, and inadequate sampling. Informed
written consent was given. The usual time-out protocol was performed
immediately prior to the procedure.

Lesion quadrant: Upper outer quadrant: Near 3 o'clock.

Using sterile technique and 1% Lidocaine as local anesthetic, under
direct ultrasound visualization, a 12 gauge KALETSI device was
used to perform biopsy of the 4 mm mass using an inferior approach.
At the conclusion of the procedure ribbon shaped tissue marker clip
was deployed into the biopsy cavity.

Lesion #2: Asymmetry, medial left breast.

The patient and I discussed the procedure of stereotactic-guided
biopsy including benefits and alternatives. We discussed the high
likelihood of a successful procedure. We discussed the risks of the
procedure including infection, bleeding, tissue injury, clip
migration, and inadequate sampling. Informed written consent was
given. The usual time out protocol was performed immediately prior
to the procedure.

Using sterile technique and 1% Lidocaine as local anesthetic, under
stereotactic guidance, a 9 gauge vacuum assisted device was used to
perform core needle biopsy of the asymmetry in the medial left
breast using a superior approach.

Lesion quadrant: Lower inner quadrant: Near 9 o'clock.

At the conclusion of the procedure, an X shaped shaped tissue marker
clip was deployed into the biopsy cavity.

Follow up 2 view mammogram was performed and dictated separately.
IMPRESSION: Ultrasound guided biopsy of a 3 o'clock position left breast mass.

Stereotactic guided biopsy of a medial left breast asymmetry.

No apparent complications.

ADDENDUM:
At the conclusion of the ultrasound procedure, the patient was
inadvertently punctured in the left leg with the trocar from the
core needle biopsy device introducer. The trocar had only touched
the patient, and was otherwise sterile. There is no exposure risk to
the patient. The small puncture wound was cleaned and dressed with a
Band-Aid. She was told to return with any symptoms infection.

ADDENDUM:
Pathology revealed FIBROADENOMA of the LEFT breast, 3 o'clock,
[WN], (ribbon clip). This was found to be concordant by Dr. KALETSI
KALETSI.

Pathology revealed SMALL FIBROADENOMA of the LEFT breast, medial, (x
clip). This was found to be concordant by Dr. KALETSI.

Pathology results were discussed with the patient by telephone. The
patient reported doing well after the biopsies with tenderness at
the sites. Post biopsy instructions and care were reviewed and
questions were answered. The patient was encouraged to call The
direct phone number was provided.

The patient has a recent diagnosis of RIGHT breast cancer and should
follow her outlined treatment plan.

The patient is scheduled for a BILATERAL breast MRI on [DATE].

Pathology results reported by KALETSI, RN on [DATE].

*** End of Addendum ***
Addendum:
PROCEDURE:
Lesion #1: 4 mm mass, 3 o'clock, 4 cmfn.

I met with the patient and we discussed the procedure of
ultrasound-guided biopsy, including benefits and alternatives. We
discussed the high likelihood of a successful procedure. We
discussed the risks of the procedure, including infection, bleeding,
tissue injury, clip migration, and inadequate sampling. Informed
written consent was given. The usual time-out protocol was performed
immediately prior to the procedure.

Lesion quadrant: Upper outer quadrant: Near 3 o'clock.

Using sterile technique and 1% Lidocaine as local anesthetic, under
direct ultrasound visualization, a 12 gauge KALETSI device was
used to perform biopsy of the 4 mm mass using an inferior approach.
At the conclusion of the procedure ribbon shaped tissue marker clip
was deployed into the biopsy cavity.

Lesion #2: Asymmetry, medial left breast.

The patient and I discussed the procedure of stereotactic-guided
biopsy including benefits and alternatives. We discussed the high
likelihood of a successful procedure. We discussed the risks of the
procedure including infection, bleeding, tissue injury, clip
migration, and inadequate sampling. Informed written consent was
given. The usual time out protocol was performed immediately prior
to the procedure.

Using sterile technique and 1% Lidocaine as local anesthetic, under
stereotactic guidance, a 9 gauge vacuum assisted device was used to
perform core needle biopsy of the asymmetry in the medial left
breast using a superior approach.

Lesion quadrant: Lower inner quadrant: Near 9 o'clock.

At the conclusion of the procedure, an X shaped shaped tissue marker
clip was deployed into the biopsy cavity.

Follow up 2 view mammogram was performed and dictated separately.
IMPRESSION: Ultrasound guided biopsy of a 3 o'clock position left breast mass.

Stereotactic guided biopsy of a medial left breast asymmetry.

No apparent complications.

ADDENDUM:
At the conclusion of the ultrasound procedure, the patient was
inadvertently punctured in the left leg with the trocar from the
core needle biopsy device introducer. The trocar had only touched
the patient, and was otherwise sterile. There is no exposure risk to
the patient. The small puncture wound was cleaned and dressed with a
Band-Aid. She was told to return with any symptoms infection.

*** End of Addendum ***
PROCEDURE:
Lesion #1: 4 mm mass, 3 o'clock, 4 cmfn.

I met with the patient and we discussed the procedure of
ultrasound-guided biopsy, including benefits and alternatives. We
discussed the high likelihood of a successful procedure. We
discussed the risks of the procedure, including infection, bleeding,
tissue injury, clip migration, and inadequate sampling. Informed
written consent was given. The usual time-out protocol was performed
immediately prior to the procedure.

Lesion quadrant: Upper outer quadrant: Near 3 o'clock.

Using sterile technique and 1% Lidocaine as local anesthetic, under
direct ultrasound visualization, a 12 gauge KALETSI device was
used to perform biopsy of the 4 mm mass using an inferior approach.
At the conclusion of the procedure ribbon shaped tissue marker clip
was deployed into the biopsy cavity.

Lesion #2: Asymmetry, medial left breast.

The patient and I discussed the procedure of stereotactic-guided
biopsy including benefits and alternatives. We discussed the high
likelihood of a successful procedure. We discussed the risks of the
procedure including infection, bleeding, tissue injury, clip
migration, and inadequate sampling. Informed written consent was
given. The usual time out protocol was performed immediately prior
to the procedure.

Using sterile technique and 1% Lidocaine as local anesthetic, under
stereotactic guidance, a 9 gauge vacuum assisted device was used to
perform core needle biopsy of the asymmetry in the medial left
breast using a superior approach.

Lesion quadrant: Lower inner quadrant: Near 9 o'clock.

At the conclusion of the procedure, an X shaped shaped tissue marker
clip was deployed into the biopsy cavity.

Follow up 2 view mammogram was performed and dictated separately.
IMPRESSION: Ultrasound guided biopsy of a 3 o'clock position left breast mass.

Stereotactic guided biopsy of a medial left breast asymmetry.

No apparent complications.

## 2021-06-27 IMAGING — MG MM BREAST LOCALIZATION CLIP
4 series · 4 of 12 positions shown · non-contrast
Comparison: Previous exam(s).

CLINICAL DATA: Evaluate post biopsy marker clip placements
following ultrasound-guided core needle biopsy a 3 o'clock position
left breast mass and stereotactic core needle biopsy of medial left
breast asymmetry.

EXAM:
3D DIAGNOSTIC LEFT MAMMOGRAM POST ULTRASOUND BIOPSY
3D DIAGNOSTIC LEFT MAMMOGRAM POST STEREOTACTIC BIOPSY

[L CC synth-2D]
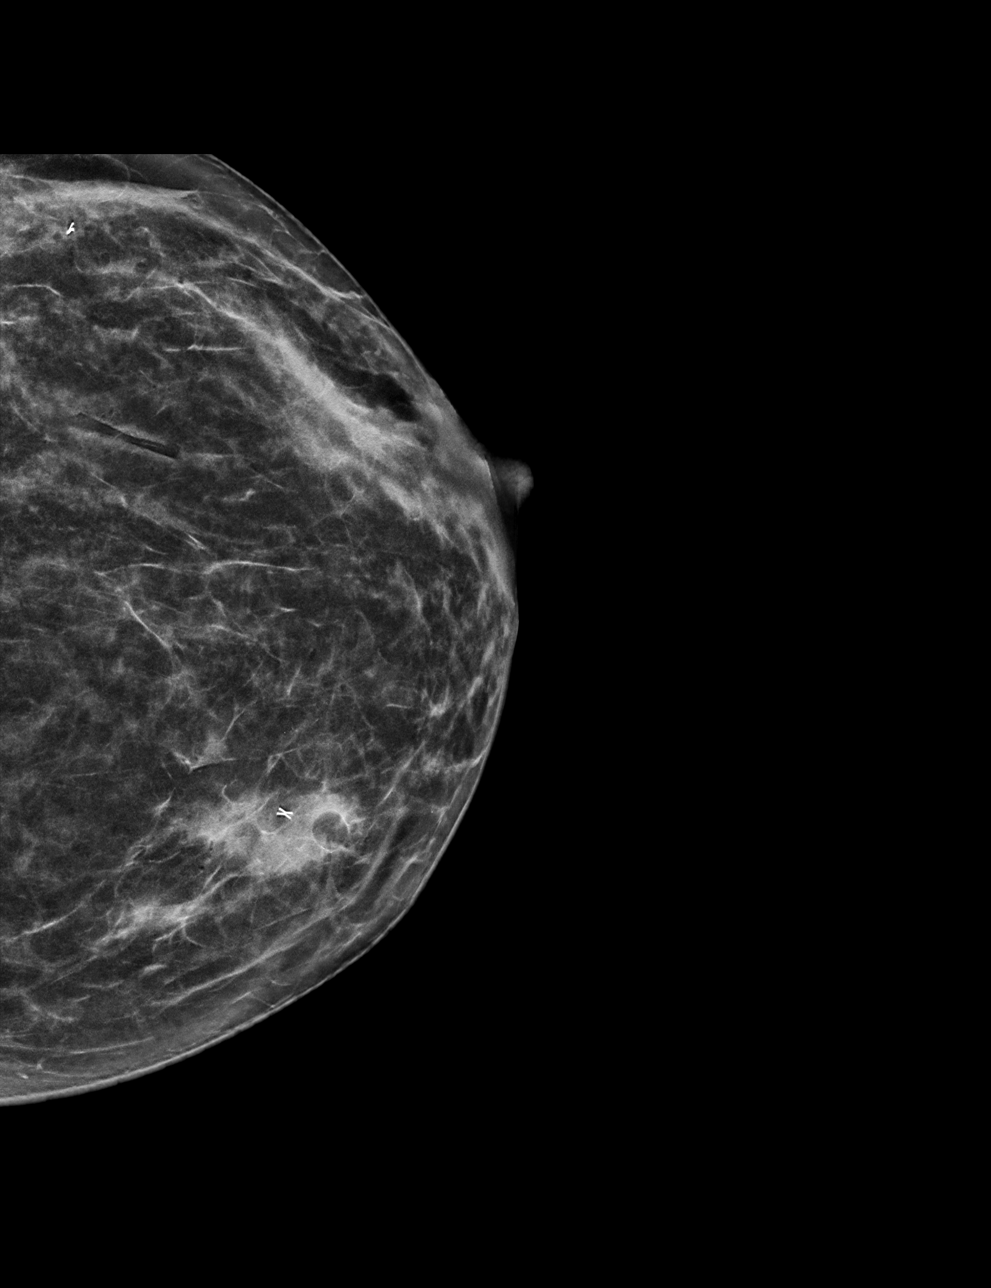

[L ML synth-2D]
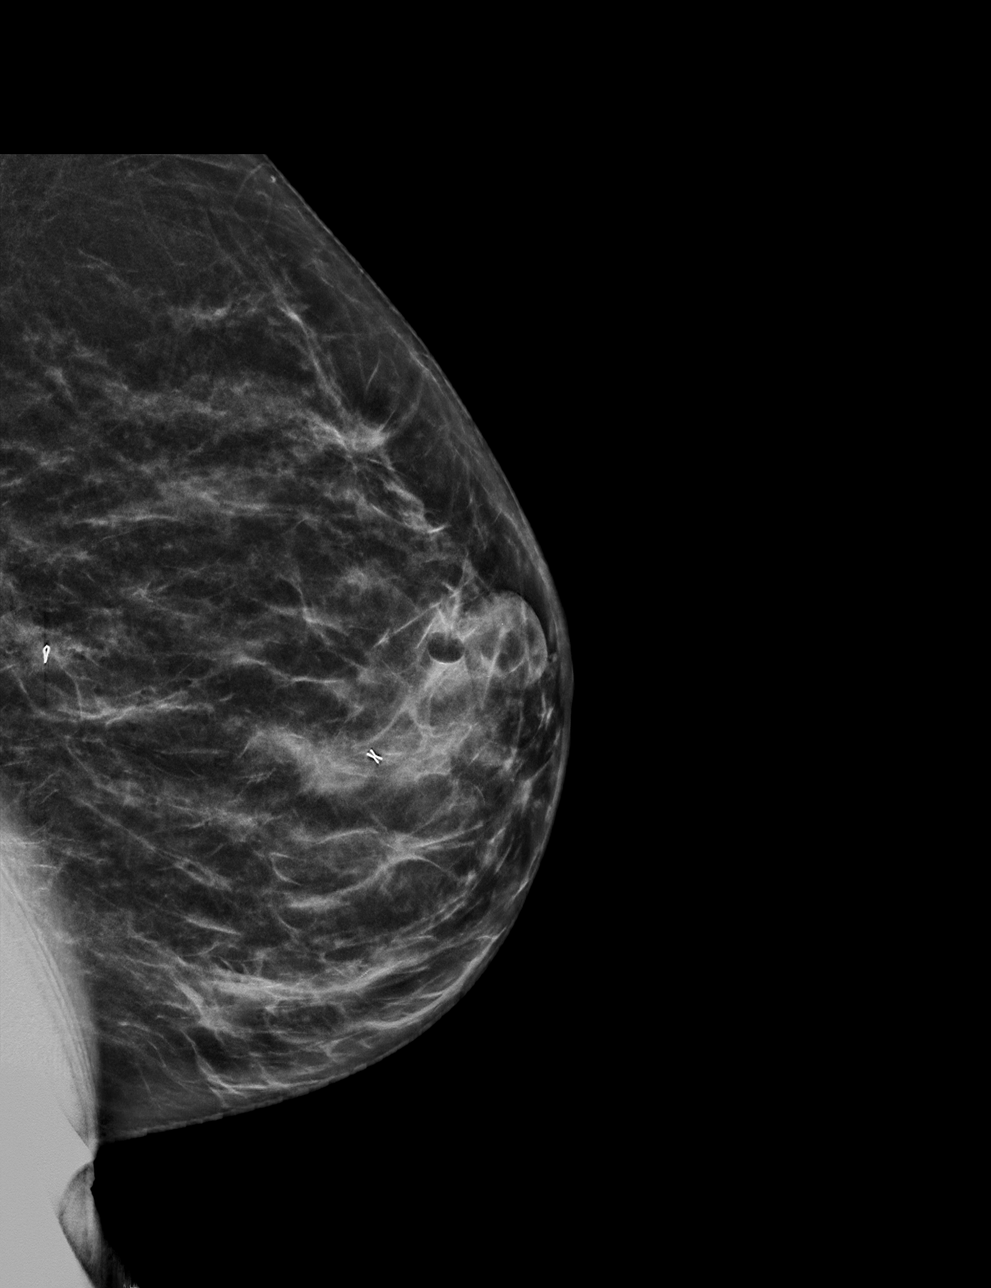

[L ML tomo · tomo slice 29/56.0]
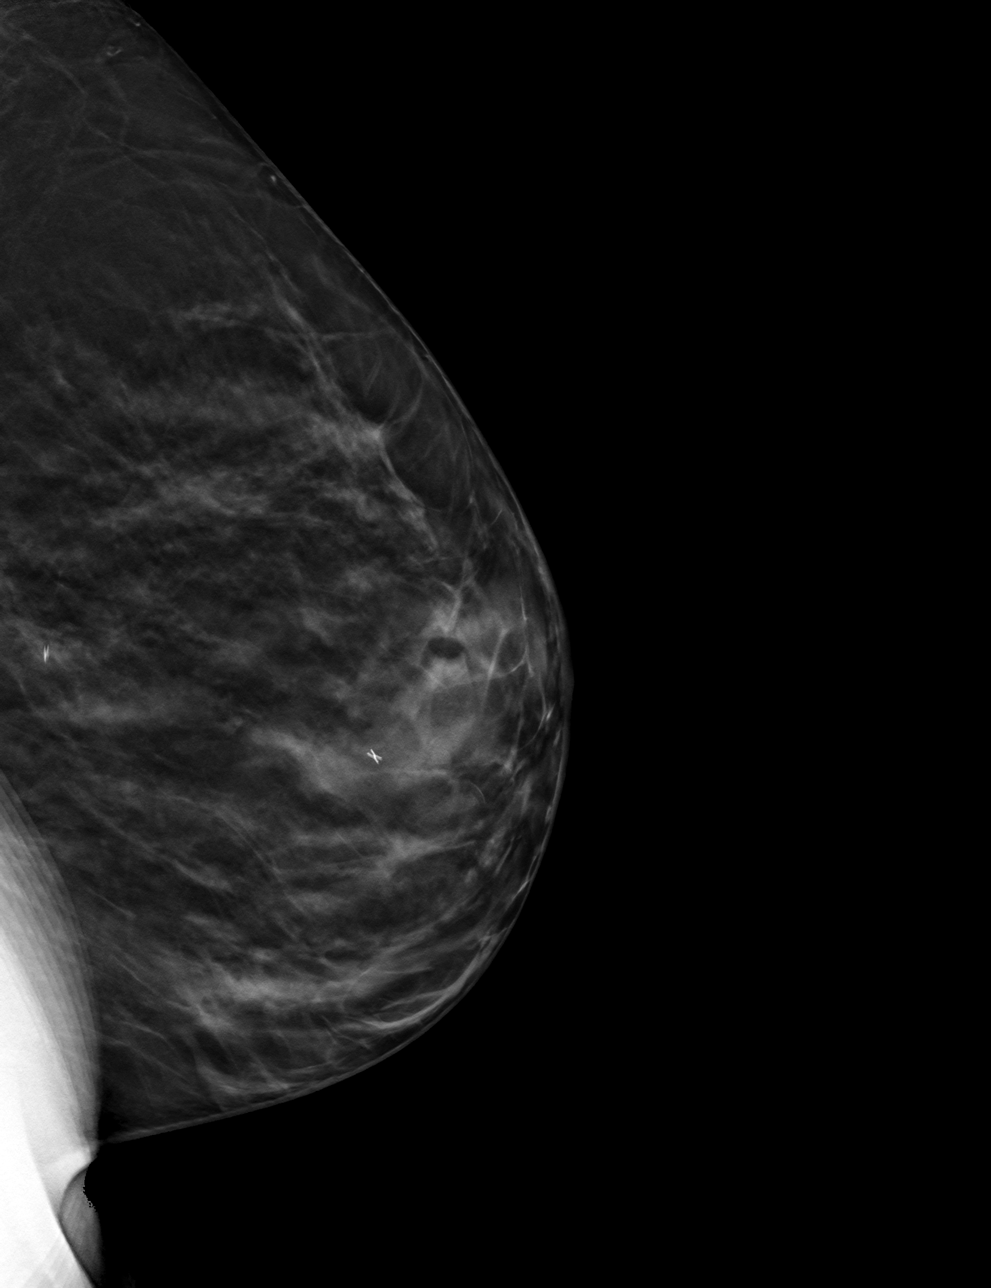

[L CC tomo · tomo slice 28/55.0]
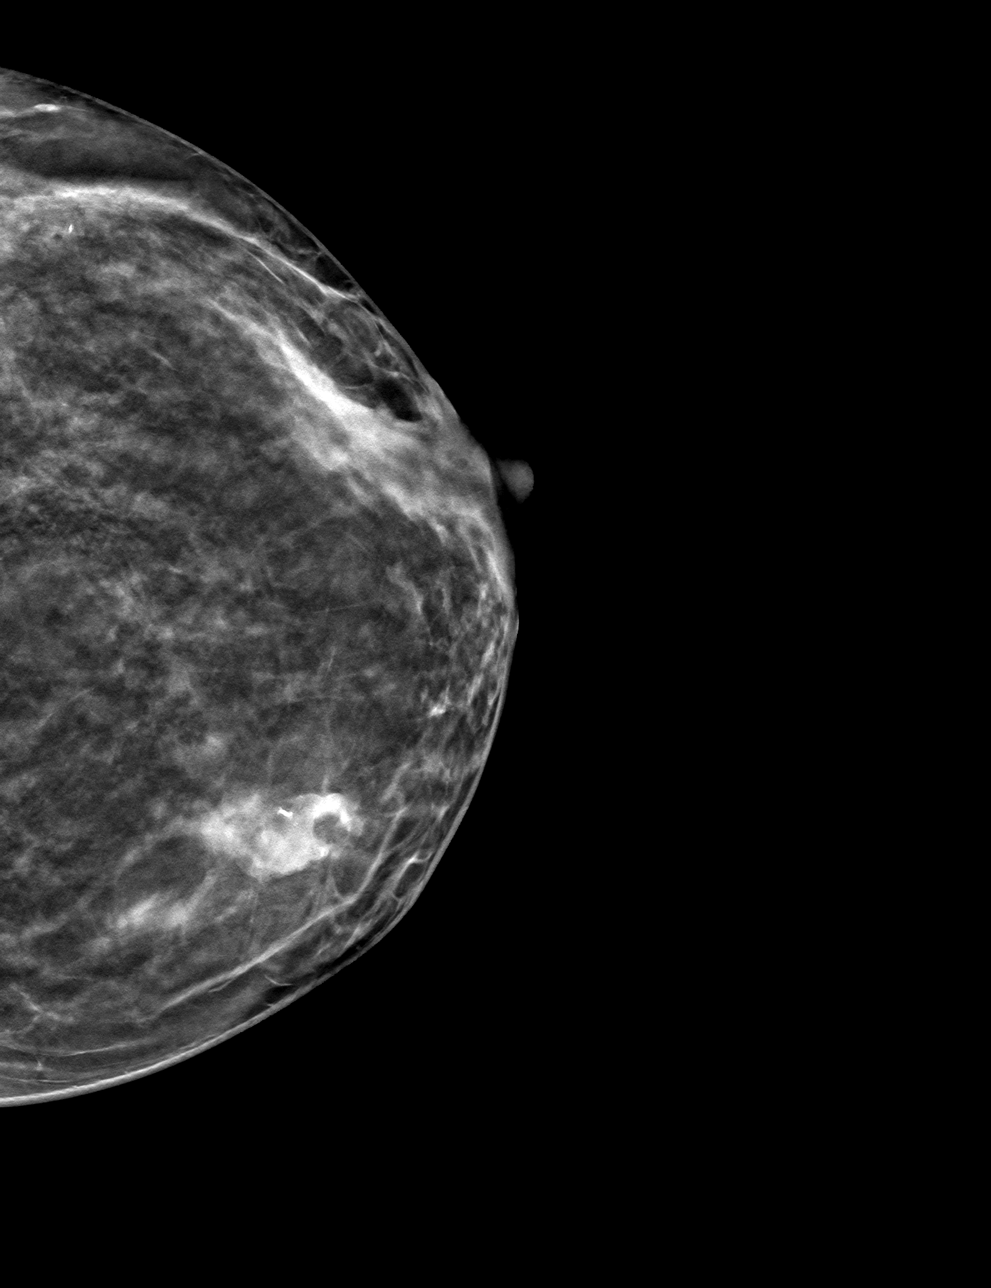

[4 of 12 positions shown; findings below may reference images not displayed]

FINDINGS: 3D Mammographic images were obtained following ultrasound and
stereotactic guided biopsy of the left breast. The ribbon shaped
biopsy clip lies the lateral left breast in the expected location of
the 4 mm, 3 o'clock position, left breast mass. The X shaped biopsy
clip lies in the medial breast, near 9 o'clock, with a small area of
surrounding post biopsy hemorrhage, in the expected location of the
asymmetry noted on diagnostic mammography.
IMPRESSION: Appropriate positioning of the ribbon and shaped biopsy marking
clips at the site of biopsy in the left breast laterally and
medially as detailed above.

Final Assessment: Post Procedure Mammograms for Marker Placement

## 2021-06-27 NOTE — Telephone Encounter (Signed)
Pt called regarding prognostic panel. Discussed results. Informed Dr. Lindi Adie will go into further detail during her appt. Confirmed new pt appt on 9/22.

## 2021-06-28 ENCOUNTER — Ambulatory Visit
Admission: RE | Admit: 2021-06-28 | Discharge: 2021-06-28 | Disposition: A | Discharge status: Home / Self Care | Payer: BC Managed Care – PPO | Source: Ambulatory Visit | Attending: Hematology and Oncology | Admitting: Hematology and Oncology

## 2021-06-28 ENCOUNTER — Telehealth: Payer: Self-pay | Admitting: Hematology and Oncology

## 2021-06-28 ENCOUNTER — Other Ambulatory Visit: Payer: Self-pay | Admitting: *Deleted

## 2021-06-28 ENCOUNTER — Encounter: Payer: Self-pay | Admitting: *Deleted

## 2021-06-28 ENCOUNTER — Other Ambulatory Visit: Payer: Self-pay | Admitting: General Surgery

## 2021-06-28 DIAGNOSIS — Z170 Estrogen receptor positive status [ER+]: Secondary | ICD-10-CM

## 2021-06-28 DIAGNOSIS — C50411 Malignant neoplasm of upper-outer quadrant of right female breast: Secondary | ICD-10-CM

## 2021-06-28 DIAGNOSIS — N63 Unspecified lump in unspecified breast: Secondary | ICD-10-CM

## 2021-06-28 IMAGING — MR MR BREAST BILAT WO/W CM
8 of 14 series · 32 of 48 positions shown · IV contrast (gadavist)
Comparison: Recent exams including [DATE]

CLINICAL DATA: Multiple recent biopsies. Biopsy of mass in the
retroareolar region of the RIGHT breast shows invasive mammary
carcinoma. Biopsy of mass in the 9 o'clock location of the RIGHT
breast shows invasive mammary carcinoma. Biopsy of RIGHT axillary
lymph node shows metastatic carcinoma. Biopsy of mass in the 3
o'clock location of the LEFT breast shows fibroadenoma without
atypia. Biopsy of MEDIAL LEFT breast mass shows small fibroadenoma
without atypia.

LABS:  None obtained at the time of imaging.
EXAM:
BILATERAL BREAST MRI WITH AND WITHOUT CONTRAST
TECHNIQUE: Multiplanar, multisequence MR images of both breasts were obtained
prior to and following the intravenous administration of 6 ml of
Gadavist

[Series 2: t2_tirm_tra ipat (a-p) · axial · 3.0mm · 0.66mm/px · 1 of 55 slices shown]
[im 1/55]
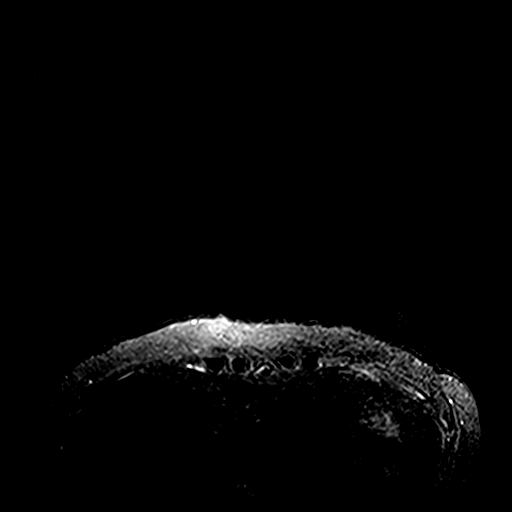

[Series 3: fl3d pre-cm no · axial · non-contrast · 1.2mm · 0.89mm/px · z∈[-94,+78]mm · 5 of 144 slices shown]
[im 1/144]
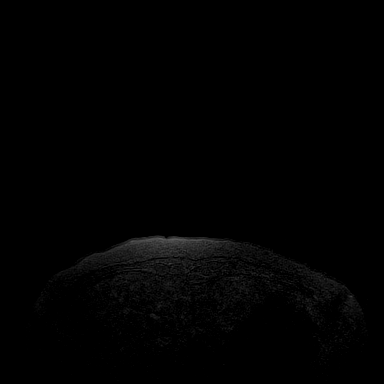
[im 36/144]
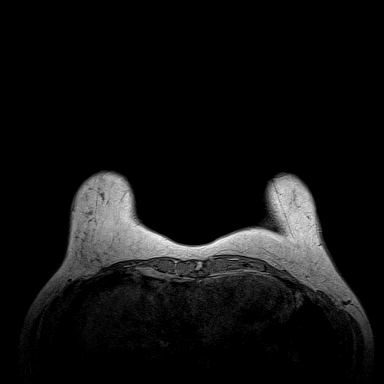
[im 72/144]
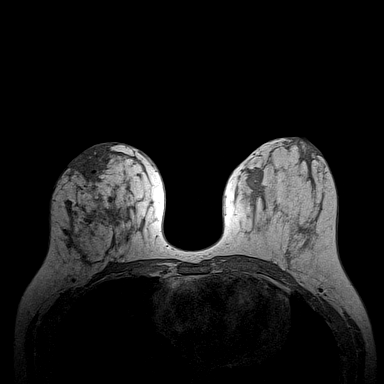
[im 108/144]
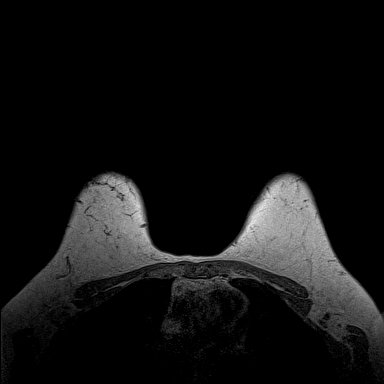
[im 144/144]
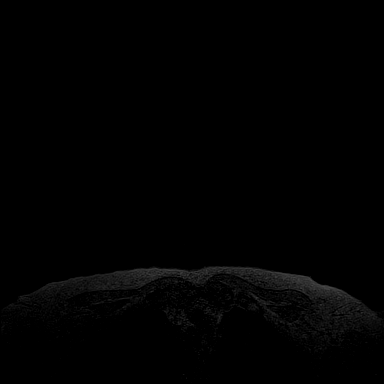

[Series 4: fl3d pre-cm · axial · non-contrast · 1.2mm · 0.89mm/px · z∈[-94,+78]mm · 5 of 144 slices shown]
[im 1/144]
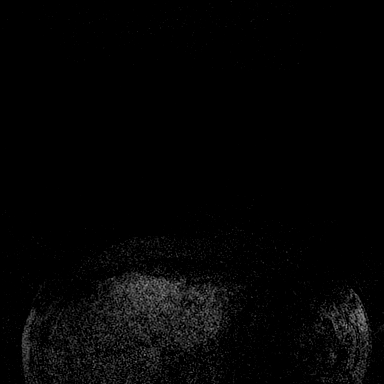
[im 36/144]
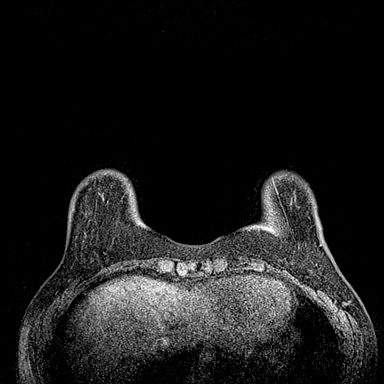
[im 72/144]
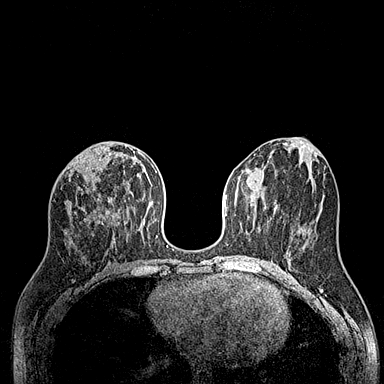
[im 108/144]
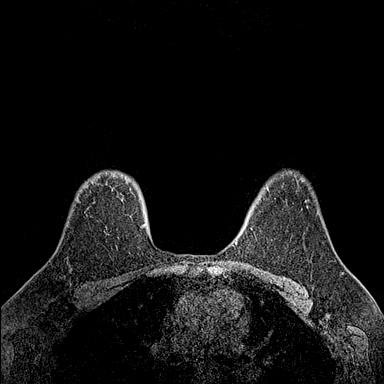
[im 144/144]
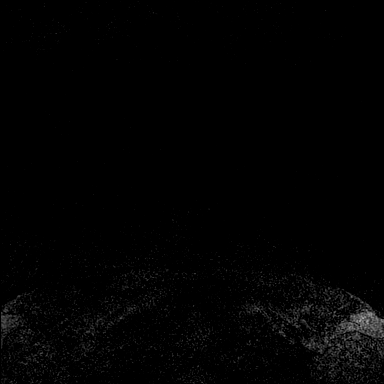

[Series 5: fl3d post-cm 20 · axial · 1.2mm · 0.89mm/px · z∈[-94,+78]mm · 5 of 144 slices shown (1 of 3)]
[im 1/144]
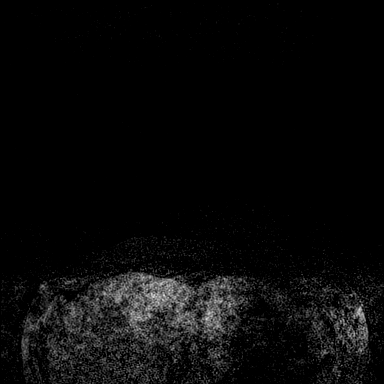
[im 36/144]
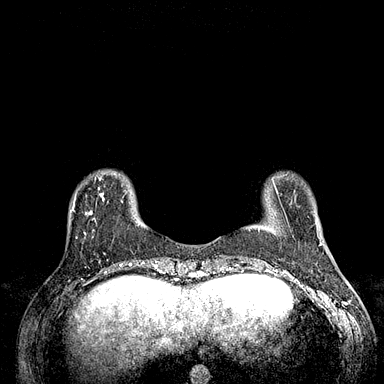
[im 72/144]
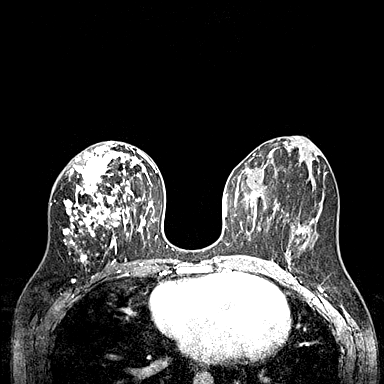
[im 108/144]
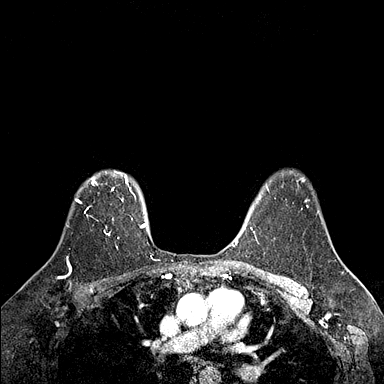
[im 144/144]
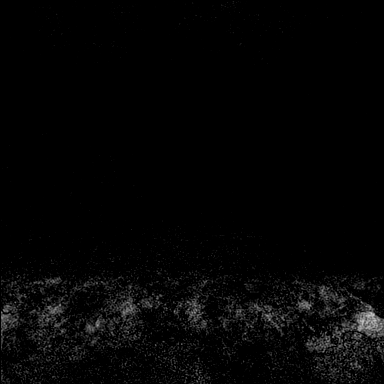

[Series 6: fl3d post-cm 20 · axial · 1.2mm · 0.89mm/px · z∈[-94,+78]mm · 5 of 144 slices shown (2 of 3)]
[im 1/144]
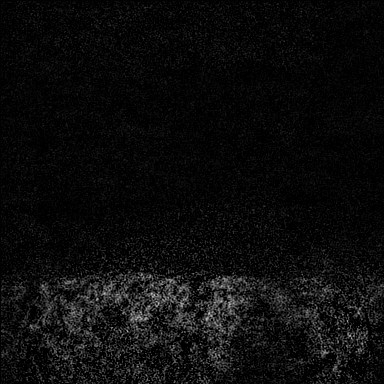
[im 36/144]
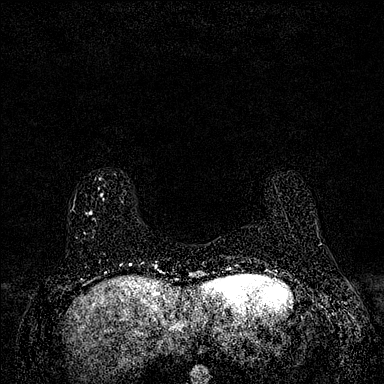
[im 72/144]
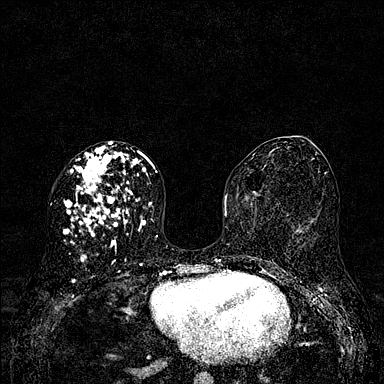
[im 108/144]
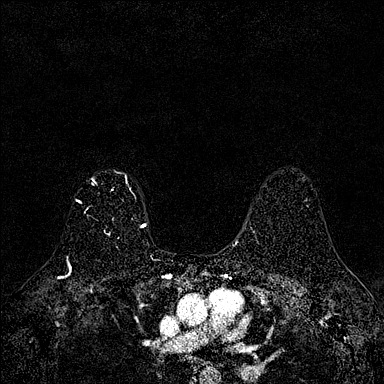
[im 144/144]
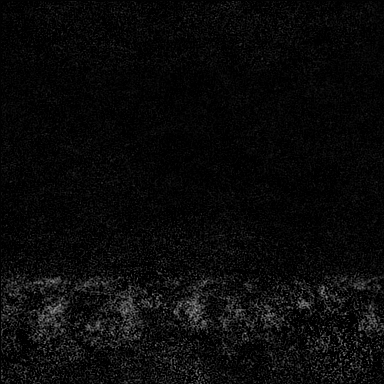

[Series 7: fl3d post-cm 20 · axial · 172.8mm · 0.89mm/px · 1 of 1 slices shown (3 of 3)]
[im 1/1]
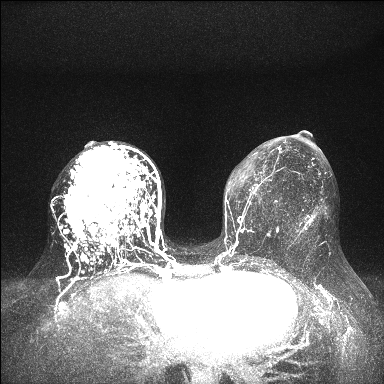

[Series 8: fl3d post-cm 3 · axial · 1.2mm · 0.89mm/px · z∈[-94,+78]mm · 5 of 144 slices shown (1 of 2)]
[im 1/144]
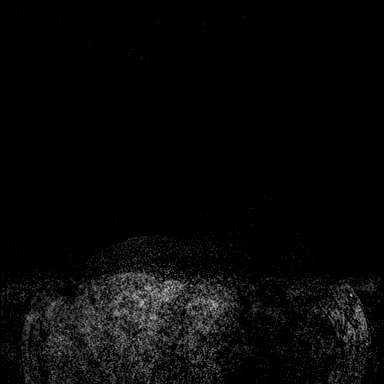
[im 36/144]
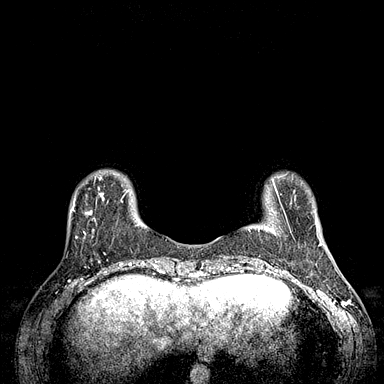
[im 72/144]
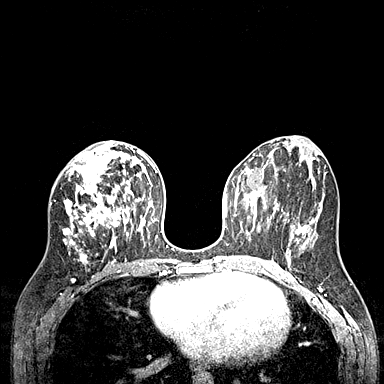
[im 108/144]
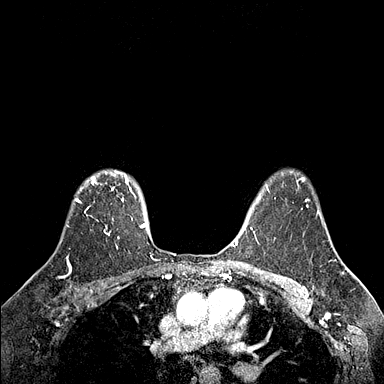
[im 144/144]
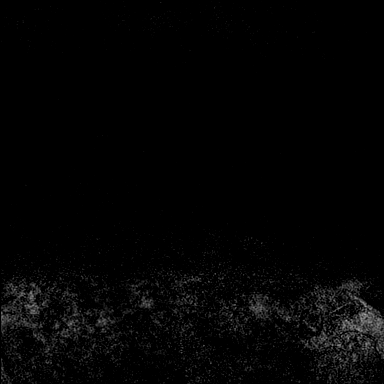

[Series 9: fl3d post-cm 3 · axial · 1.2mm · 0.89mm/px · z∈[-94,+78]mm · 5 of 144 slices shown (2 of 2)]
[im 1/144]
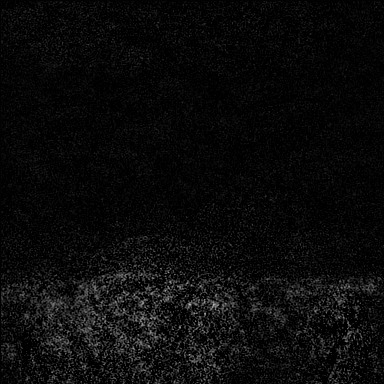
[im 36/144]
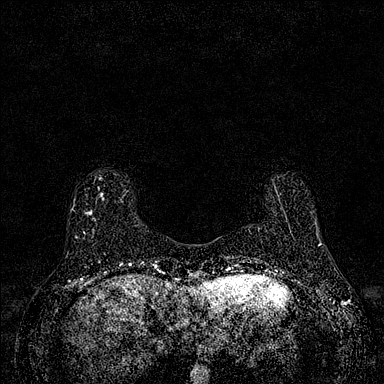
[im 72/144]
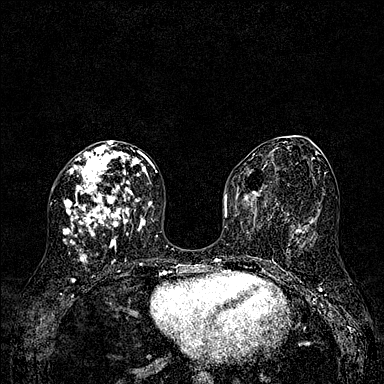
[im 108/144]
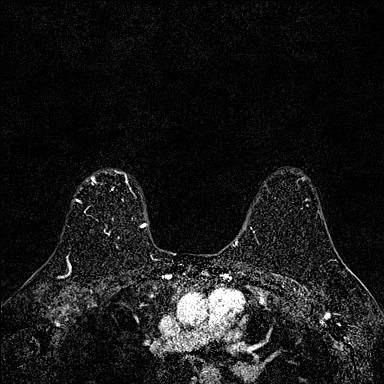
[im 144/144]
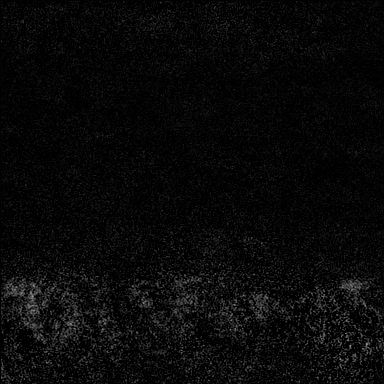

[32 of 48 positions shown; findings below may reference images not displayed]

Three-dimensional MR images were rendered by post-processing of the
original MR data on an independent workstation. The
three-dimensional MR images were interpreted, and findings are
reported in the following complete MRI report for this study. Three
dimensional images were evaluated at the independent interpreting
workstation using the DynaCAD thin client.
FINDINGS: Breast composition: c. Heterogeneous fibroglandular tissue.

Background parenchymal enhancement: Minimal

Right breast: There are numerous confluent masses throughout all
quadrants of the RIGHT breast, more contiguous in the retroareolar
region. Enhancement of these masses is washout type kinetics.
Measured together, this and of abnormal enhancement is 11.1 x 7.8 x
9.5 centimeters. Tissue marker clip is identified in the
retroareolar region of the RIGHT breast. A second tissue marker clip
is identified in the LATERAL portion of the RIGHT breast. These mark
the sites of recent biopsies.

Left breast: Tissue marker clips are identified in the MEDIAL and
LATERAL aspects of the LEFT breast following recent concordant
biopsies. Small cysts are present in the LEFT breast. No suspicious
enhancement.

Lymph nodes: Within the RIGHT axilla, there is a prominent lymph
node, likely representing the recently biopsied lymph node. A second
lymph node just inferior this consistent with a borderline lymph
node seen at ultrasound.

Ancillary findings:  None.
IMPRESSION: Confluent enhancing masses throughout all quadrants of the RIGHT
breast spanning 11.1 x 7.8 x 9.5 centimeters.

One enlarged lymph node and a second borderline lymph node in the
RIGHT axilla.

LEFT breast is negative.

RECOMMENDATION:
Recommend treatment plan for known RIGHT breast malignancy.

BI-RADS CATEGORY  6: Known biopsy-proven malignancy.

## 2021-06-28 MED ORDER — GADOBUTROL 1 MMOL/ML IV SOLN
6.0000 mL | Freq: Once | INTRAVENOUS | Status: AC | PRN
  Administered 2021-06-28: 6 mL via INTRAVENOUS

## 2021-06-28 NOTE — Progress Notes (Signed)
[facility] CONSULT NOTE  Patient Care Team: Dian Queen, MD as PCP - General (Obstetrics and Gynecology) Dian Queen, MD (Obstetrics and Gynecology) Mauro Kaufmann, RN as Oncology Nurse Navigator Rockwell [place], RN as Oncology Nurse Navigator  CHIEF COMPLAINTS/PURPOSE OF CONSULTATION:  Newly diagnosed right breast cancer  HISTORY OF PRESENTING ILLNESS:  Jenna Cross 35 y.o. female is here because of recent diagnosis of invasive mammary carcinoma of the right breast. She palpated a lump in the right breast. Diagnostic mammogram and [place] on 06/19/2021 showed a 3.8 cm mass at the palpable site of concern in the retroareolar right breast, 1 prominent right axillary lymph node with a 4 mm cortex, 1 other which is borderline measuring 3 mm, and indeterminate 4 mm mass in the left breast at 3 o'clock. Biopsy right breast on 06/23/2021 showed invasive mammary carcinoma with metastatic carcinoma involving one lymph node, ER+(95%)/PR+(85%)/Her2+ (3+). Biopsy left breast on 06/28/2021 showed no evidence of atypia or malignancy. She presents to the clinic today for initial evaluation and discussion of treatment options.   I reviewed her records extensively and collaborated the history with the patient.  SUMMARY OF ONCOLOGIC HISTORY: Oncology History  Malignant neoplasm of upper-outer quadrant of right breast in female, estrogen receptor positive ([facility])  06/23/2021 Initial Diagnosis   Palpable lump in the right breast. Diagnostic mammogram and [place]: a 3.8 cm mass at the retroareolar right breast, 1 prominent right axillary lymph node with a 4 mm cortex, 1 other which is borderline measuring 3 mm. 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  3 o'clock position: Biopsy fibroadenoma     MEDICAL HISTORY:  Past Medical History:  Diagnosis Date   Anxiety    Anxiety    no medication    SURGICAL  HISTORY: Past Surgical History:  Procedure Laterality Date   BREAST SURGERY     cyst removal from right breast    CESAREAN SECTION N/A 01/31/2021   Procedure: CESAREAN SECTION;  Surgeon: Dian Queen, MD;  Location: [facility];  Service: Obstetrics;  Laterality: N/A;   excision of fibroadenoma of breast     TONSILLECTOMY     and adenoids   TONSILLECTOMY AND ADENOIDECTOMY     WISDOM TOOTH EXTRACTION      SOCIAL HISTORY: Social History   Socioeconomic History   Marital status: Married    Spouse name: Not on file   Number of children: 0   Years of education: College   Highest education level: Not on file  Occupational History   Not on file  Tobacco Use   Smoking status: Former   Smokeless tobacco: Never  Scientific laboratory technician Use: Never used  Substance and Sexual Activity   Alcohol use: Not Currently   Drug use: Never   Sexual activity: Yes    Birth control/protection: None  Other Topics Concern   Not on file  Social History Narrative   ** Merged History Encounter **       Lives at home Right handed Drinks 1 cup of coffee daily and a couple of sodas per month   Social Determinants of Health   Financial Resource Strain: Not on file  Food Insecurity: Not on file  Transportation Needs: Not on file  Physical Activity: Not on file  Stress: Not on file  Social Connections: Not on file  Intimate Partner Violence: Not on file    FAMILY HISTORY: Family History  Problem Relation Age  of Onset   Migraines Mother     ALLERGIES:  has No Known Allergies.  MEDICATIONS:  Current Outpatient Medications  Medication Sig Dispense Refill   acetaminophen (TYLENOL) 500 MG tablet Take 1,000 mg by mouth every 6 (six) hours as needed for mild pain or headache.     cetirizine (ZYRTEC) 10 MG tablet Take 10 mg by mouth at bedtime.     ferrous sulfate 325 (65 FE) MG EC tablet Take 1 tablet (325 mg total) by mouth 2 (two) times daily. 60 tablet 2   ibuprofen (ADVIL) 600 MG tablet  Take 1 tablet (600 mg total) by mouth every 6 (six) hours as needed. 30 tablet 0   No current facility-administered medications for this visit.    REVIEW OF SYSTEMS:   Constitutional: Denies fevers, chills or abnormal night sweats Eyes: Denies blurriness of vision, double vision or watery eyes Ears, nose, mouth, throat, and face: Denies mucositis or sore throat Respiratory: Denies cough, dyspnea or wheezes Cardiovascular: Denies palpitation, chest discomfort or lower extremity swelling Gastrointestinal:  Denies nausea, heartburn or change in bowel habits Skin: Denies abnormal skin rashes Lymphatics: Denies new lymphadenopathy or easy bruising Neurological:Denies numbness, tingling or new weaknesses Behavioral/Psych: Mood is stable, no new changes  Breast: Palpable lump in the right breast All other systems were reviewed with the patient and are negative.  PHYSICAL EXAMINATION: ECOG PERFORMANCE STATUS: 1 - Symptomatic but completely ambulatory  Vitals:   06/29/21 1318  BP: (!) 119/49  Pulse: 81  Resp: 18  Temp: 97.9 F (36.6 C)  SpO2: 98%   Filed Weights   06/29/21 1318  Weight: 142 lb 6.4 oz (64.6 kg)      LABORATORY DATA:  I have reviewed the data as listed Lab Results  Component Value Date   WBC 11.7 (H) 02/02/2021   HGB 7.9 (L) 02/02/2021   HCT 24.9 (L) 02/02/2021   MCV 97.3 02/02/2021   PLT 177 02/02/2021   Lab Results  Component Value Date   NA 140 04/05/2016   K 4.5 04/05/2016   CL 100 04/05/2016   CO2 19 04/05/2016    RADIOGRAPHIC STUDIES: I have personally reviewed the radiological reports and agreed with the findings in the report.  ASSESSMENT AND PLAN:  Malignant neoplasm of upper-outer quadrant of right breast in female, estrogen receptor positive ([facility]) Palpable lump in the right breast. Diagnostic mammogram and [place]: a 3.8 cm mass at the retroareolar right breast, 1 prominent right axillary lymph node with a 4 mm cortex, 1 other which is  borderline measuring 3 mm. 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  3 o'clock position: Biopsy fibroadenoma  Pathology and radiology counseling: Discussed with the patient, the details of pathology including the type of breast cancer,the clinical staging, the significance of ER, PR and HER-2/neu receptors and the implications for treatment. After reviewing the pathology in detail, we proceeded to discuss the different treatment options between surgery, radiation, chemotherapy, antiestrogen therapies.  Recommendation based on multidisciplinary tumor board: 1. Neoadjuvant chemotherapy with TCH Perjeta 6 cycles followed by Herceptin Perjeta maintenance versus Kadcyla maintenance (based on response to neoadjuvant chemo) for 1 year 2. Followed by breast conserving surgery if possible with sentinel lymph node study 3. Followed by adjuvant radiation therapy if patient had lumpectomy  Chemotherapy Counseling: I discussed the risks and benefits of chemotherapy including the risks of nausea/ vomiting, risk of infection from low WBC count, fatigue due to chemo or anemia, bruising  or bleeding due to low platelets, mouth sores, loss/ change in taste and decreased appetite. Liver and kidney function will be monitored through out chemotherapy as abnormalities in liver and kidney function may be a side effect of treatment. Cardiac dysfunction due to Herceptin and Perjeta and neuropathy risk from Taxotere were discussed in detail. Risk of permanent bone marrow dysfunction due to chemo were also discussed.  Plan: 1. Port placement 2. Echocardiogram 3. Chemotherapy class 4. Breast MRI  Because of concern for metastatic disease, we will obtain CT chest abdomen pelvis (abdominal discomfort) and bone scan (bone pain) as well as brain MRI (patient has headaches intermittently)  For fertility preservation, we will initiate Zoladex injections.   This will also help because she is ER/PR positive. Return to clinic in 2 weeks to start chemotherapy.    All questions were answered. The patient knows to call the clinic with any problems, questions or concerns.   Rulon Eisenmenger, MD, MPH 06/29/2021    I, Thana Ates, am acting as scribe for Nicholas Lose, MD.  I have reviewed the above documentation for accuracy and completeness, and I agree with the above.

## 2021-06-28 NOTE — Telephone Encounter (Signed)
Left message for patient with upcoming appointments gave location and times but advised she could call me back if she had any questions

## 2021-06-29 ENCOUNTER — Other Ambulatory Visit: Payer: Self-pay

## 2021-06-29 ENCOUNTER — Inpatient Hospital Stay: Payer: BC Managed Care – PPO | Attending: Hematology and Oncology | Admitting: Hematology and Oncology

## 2021-06-29 ENCOUNTER — Encounter: Payer: Self-pay | Admitting: Emergency Medicine

## 2021-06-29 VITALS — BP 119/49 | HR 81 | Temp 97.9°F | Resp 18 | Ht 64.0 in | Wt 142.4 lb

## 2021-06-29 DIAGNOSIS — C50411 Malignant neoplasm of upper-outer quadrant of right female breast: Secondary | ICD-10-CM

## 2021-06-29 DIAGNOSIS — Z87891 Personal history of nicotine dependence: Secondary | ICD-10-CM | POA: Diagnosis not present

## 2021-06-29 DIAGNOSIS — Z5111 Encounter for antineoplastic chemotherapy: Secondary | ICD-10-CM | POA: Diagnosis not present

## 2021-06-29 DIAGNOSIS — C773 Secondary and unspecified malignant neoplasm of axilla and upper limb lymph nodes: Secondary | ICD-10-CM | POA: Insufficient documentation

## 2021-06-29 DIAGNOSIS — Z170 Estrogen receptor positive status [ER+]: Secondary | ICD-10-CM

## 2021-06-29 MED ORDER — DEXAMETHASONE 4 MG PO TABS: 4.0000 mg | ORAL_TABLET | Freq: Every day | ORAL | 0 refills | Status: DC

## 2021-06-29 MED ORDER — LIDOCAINE-PRILOCAINE 2.5-2.5 % EX CREA: TOPICAL_CREAM | CUTANEOUS | 3 refills | Status: DC

## 2021-06-29 MED ORDER — PROCHLORPERAZINE MALEATE 10 MG PO TABS: 10.0000 mg | ORAL_TABLET | Freq: Four times a day (QID) | ORAL | 1 refills | Status: DC | PRN

## 2021-06-29 MED ORDER — LORAZEPAM 0.5 MG PO TABS: 0.5000 mg | ORAL_TABLET | Freq: Every evening | ORAL | 0 refills | Status: DC | PRN

## 2021-06-29 MED ORDER — ONDANSETRON HCL 8 MG PO TABS: 8.0000 mg | ORAL_TABLET | Freq: Two times a day (BID) | ORAL | 1 refills | Status: DC | PRN

## 2021-06-29 NOTE — Research (Signed)
[id number] - TREATMENT OF REFRACTORY NAUSEA  06/29/21   Study Introduction:  Patient Jenna Cross was identified by Dr. Lindi Adie as a potential candidate for the above listed study.  This Clinical Research Coordinator met with Jenna Cross, MRN [id number], on 06/29/21 in a manner and location that ensures patient privacy to discuss participation in the above listed research study.  Patient is accompanied by her family.  A copy of the informed consent document and separate HIPAA Authorization was provided to the patient.  Patient reads, speaks, and understands [place].    Patient was provided with the business card of this Coordinator and encouraged to contact the research team with any questions.  Approximately 10 minutes were spent with the patient reviewing the informed consent documents.  Patient was provided the option of taking informed consent documents home to review and was encouraged to review at their convenience with their support network, including other care providers. Patient took the consent documents home to review.  Plan: A member of the research team will reach out to the patient early next week to evaluate interest in this study and go over next steps.   Clabe Seal Clinical Research Coordinator I  06/29/21 2:46 PM

## 2021-06-29 NOTE — Progress Notes (Signed)
START ON PATHWAY REGIMEN - Breast     Cycle 1: A cycle is 21 days:     Pertuzumab      Trastuzumab-xxxx      Docetaxel      Carboplatin    Cycles 2 through 6: A cycle is every 21 days:     Pertuzumab      Trastuzumab-xxxx      Docetaxel      Carboplatin   **Always confirm dose/schedule in your pharmacy ordering system**  Patient Characteristics: Preoperative or Nonsurgical Candidate (Clinical Staging), Neoadjuvant Therapy followed by Surgery, Invasive Disease, Chemotherapy, HER2 Positive, ER Positive Therapeutic Status: Preoperative or Nonsurgical Candidate (Clinical Staging) AJCC M Category: cM0 AJCC Grade: G3 Breast Surgical Plan: Neoadjuvant Therapy followed by Surgery ER Status: Positive (+) AJCC 8 Stage Grouping: IB HER2 Status: Positive (+) AJCC T Category: cT2 AJCC N Category: cN1 PR Status: Positive (+) Intent of Therapy: Curative Intent, Discussed with Patient

## 2021-06-29 NOTE — Assessment & Plan Note (Signed)
Palpable lump in the right breast. Diagnostic mammogram and [place]: a 3.8 cm mass at the retroareolar right breast, 1 prominent right axillary lymph node with a 4 mm cortex, 1 other which is borderline measuring 3 mm. 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  3 o'clock position: Biopsy fibroadenoma  Pathology and radiology counseling: Discussed with the patient, the details of pathology including the type of breast cancer,the clinical staging, the significance of ER, PR and HER-2/neu receptors and the implications for treatment. After reviewing the pathology in detail, we proceeded to discuss the different treatment options between surgery, radiation, chemotherapy, antiestrogen therapies.  Recommendation based on multidisciplinary tumor board: 1. Neoadjuvant chemotherapy with TCH Perjeta 6 cycles followed by Herceptin Perjeta maintenance versus Kadcyla maintenance (based on response to neoadjuvant chemo) for 1 year 2. Followed by breast conserving surgery if possible with sentinel lymph node study 3. Followed by adjuvant radiation therapy if patient had lumpectomy  Chemotherapy Counseling: I discussed the risks and benefits of chemotherapy including the risks of nausea/ vomiting, risk of infection from low WBC count, fatigue due to chemo or anemia, bruising or bleeding due to low platelets, mouth sores, loss/ change in taste and decreased appetite. Liver and kidney function will be monitored through out chemotherapy as abnormalities in liver and kidney function may be a side effect of treatment. Cardiac dysfunction due to Herceptin and Perjeta and neuropathy risk from Taxotere were discussed in detail. Risk of permanent bone marrow dysfunction due to chemo were also discussed.  Plan: 1. Port placement 2. Echocardiogram 3. Chemotherapy class 4. Breast MRI  Return to clinic in 2 weeks to start chemotherapy.

## 2021-06-30 ENCOUNTER — Telehealth: Payer: Self-pay | Admitting: Hematology and Oncology

## 2021-06-30 ENCOUNTER — Telehealth: Payer: Self-pay

## 2021-06-30 NOTE — Telephone Encounter (Signed)
Sch per 9/22 los, pt aware.

## 2021-06-30 NOTE — Telephone Encounter (Signed)
Notified Patient of Prior Authorization approval for Lidocaine-Prilocaine 2.5% Cream. Medication is approved from 06/29/21 through 09/26/21

## 2021-07-03 ENCOUNTER — Encounter: Payer: Self-pay | Admitting: Hematology and Oncology

## 2021-07-03 ENCOUNTER — Ambulatory Visit
Admission: RE | Admit: 2021-07-03 | Discharge: 2021-07-03 | Disposition: A | Discharge status: Home / Self Care | Payer: BC Managed Care – PPO | Source: Ambulatory Visit | Attending: Hematology and Oncology | Admitting: Hematology and Oncology

## 2021-07-03 ENCOUNTER — Other Ambulatory Visit: Payer: Self-pay

## 2021-07-03 DIAGNOSIS — Z170 Estrogen receptor positive status [ER+]: Secondary | ICD-10-CM

## 2021-07-03 IMAGING — MR MR HEAD WO/W CM
14 series · 48 of 48 positions shown · IV contrast (multihance)
Comparison: Brain MRI [DATE]

CLINICAL DATA: Headache, history of breast cancer

EXAM:
MRI HEAD WITHOUT AND WITH CONTRAST
TECHNIQUE: Multiplanar, multiecho pulse sequences of the brain and surrounding
structures were obtained without and with intravenous contrast.
CONTRAST:  13mL MULTIHANCE GADOBENATE DIMEGLUMINE 529 MG/ML IV SOLN

[Series 5: T1 · sagittal · 4.0mm · 0.75mm/px · 1 of 31 slices shown (1 of 3)]
[im 1/31]
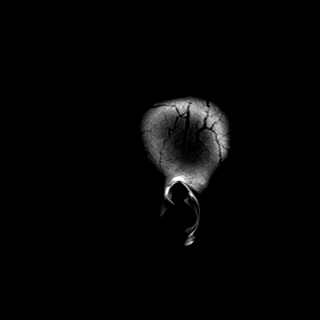

[Series 6: DWI · axial · 3.0mm · 0.94mm/px · z∈[-54,+93]mm · 8 of 168 slices shown (1 of 3)]
[im 1/168]
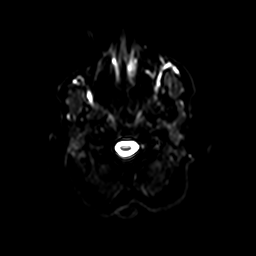
[im 24/168]
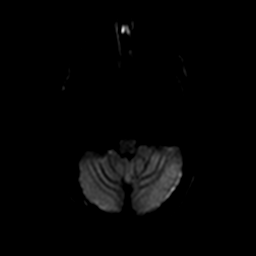
[im 48/168]
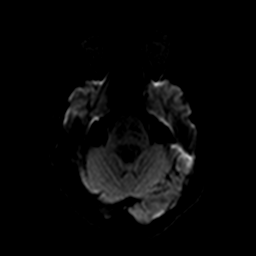
[im 72/168]
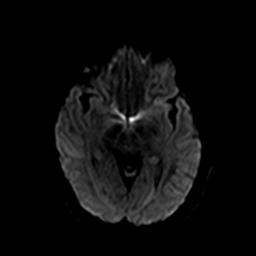
[im 96/168]
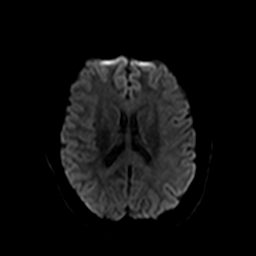
[im 120/168]
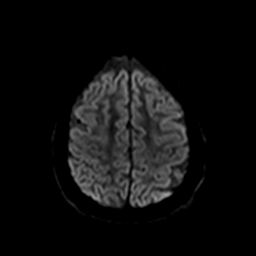
[im 144/168]
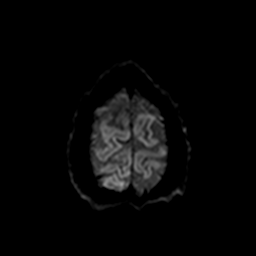
[im 168/168]
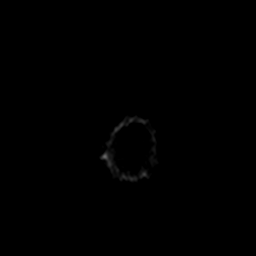

[Series 7: ax dwi_tracew · axial · 3.0mm · 0.94mm/px · z∈[-54,+93]mm · 4 of 84 slices shown]
[im 1/84]
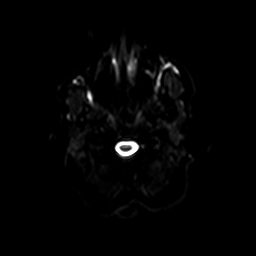
[im 28/84]
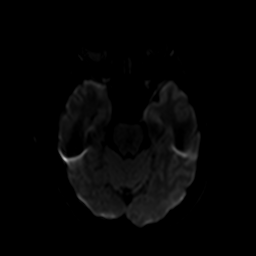
[im 56/84]
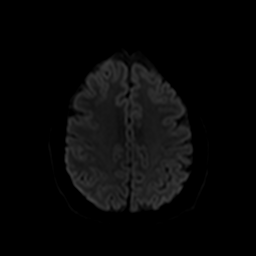
[im 84/84]
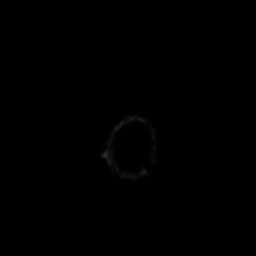

[Series 8: ax dwi_adc · axial · 3.0mm · 0.94mm/px · z∈[-54,+93]mm · 2 of 42 slices shown]
[im 1/42]
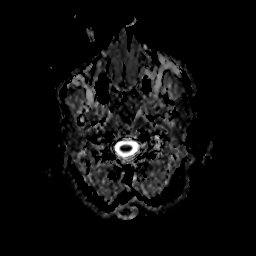
[im 42/42]
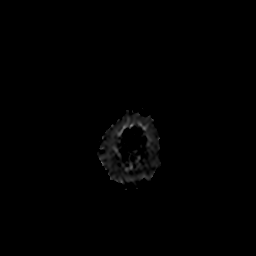

[Series 9: DWI · coronal · 5.0mm · 1.44mm/px · 3 of 68 slices shown (2 of 3)]
[im 1/68]
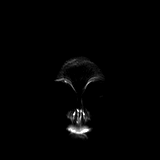
[im 34/68]
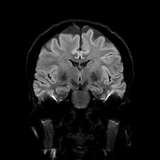
[im 68/68]
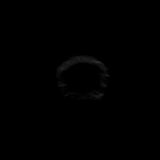

[Series 10: DWI · coronal · 5.0mm · 1.44mm/px · 2 of 34 slices shown (3 of 3)]
[im 1/34]
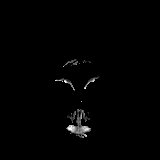
[im 34/34]
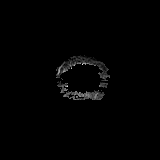

[Series 11: T2 · axial · 4.0mm · 0.36mm/px · 1 of 29 slices shown]
[im 1/29]
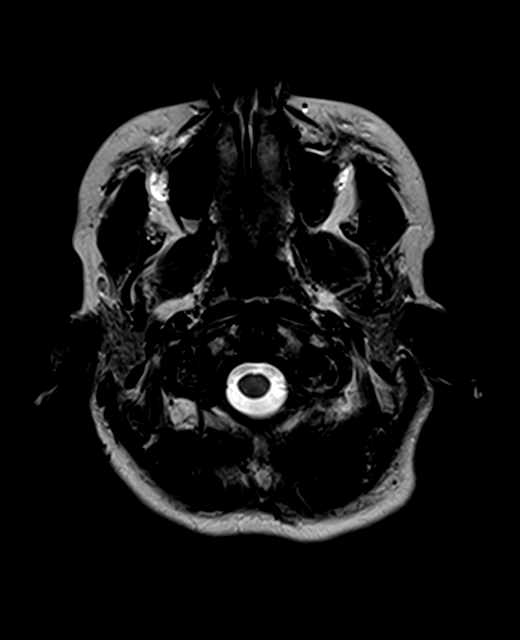

[Series 12: FLAIR · axial · 3.0mm · 0.72mm/px · 1 of 26 slices shown]
[im 1/26]
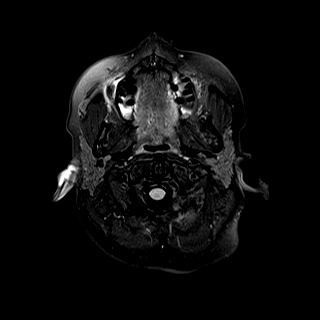

[Series 13: swi_images · axial · 1.5mm · 0.90mm/px · z∈[-49,+93]mm · 4 of 96 slices shown]
[im 1/96]
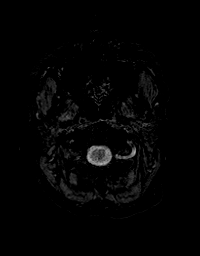
[im 32/96]
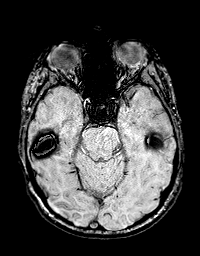
[im 64/96]
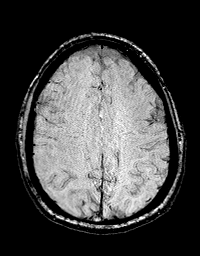
[im 96/96]
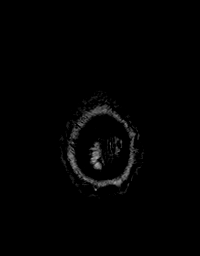

[Series 14: mip_images(sw) · axial · 12.0mm · 0.90mm/px · z∈[-44,+87]mm · 4 of 89 slices shown]
[im 1/89]
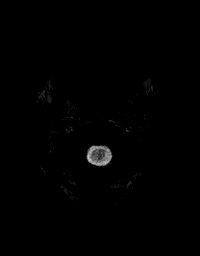
[im 30/89]
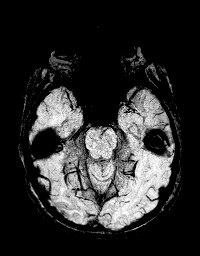
[im 59/89]
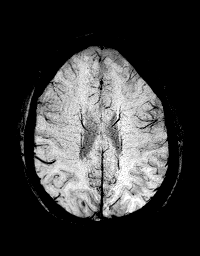
[im 89/89]
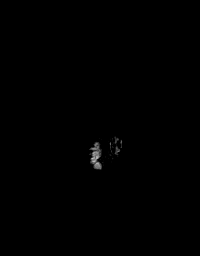

[Series 15: T1 · axial · 1.0mm · 0.94mm/px · z∈[-57,+101]mm · 7 of 160 slices shown (2 of 3)]
[im 1/160]
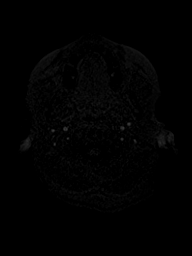
[im 27/160]
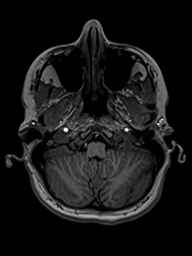
[im 54/160]
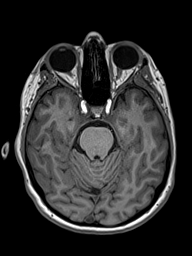
[im 80/160]
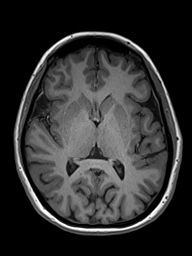
[im 107/160]
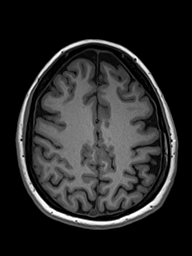
[im 133/160]
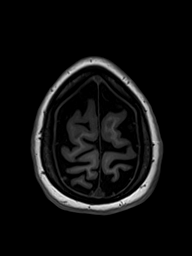
[im 160/160]
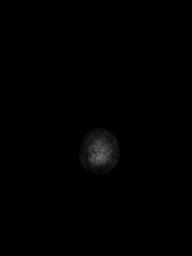

[Series 16: T2 post-contrast · coronal · 4.0mm · 0.36mm/px · 2 of 35 slices shown]
[im 1/35]
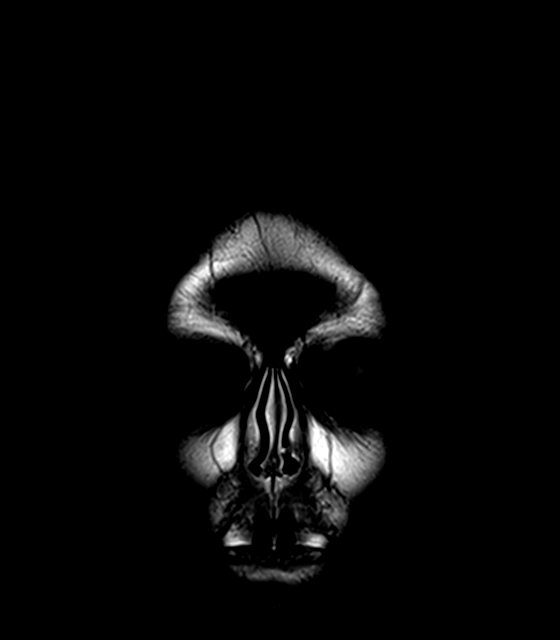
[im 35/35]
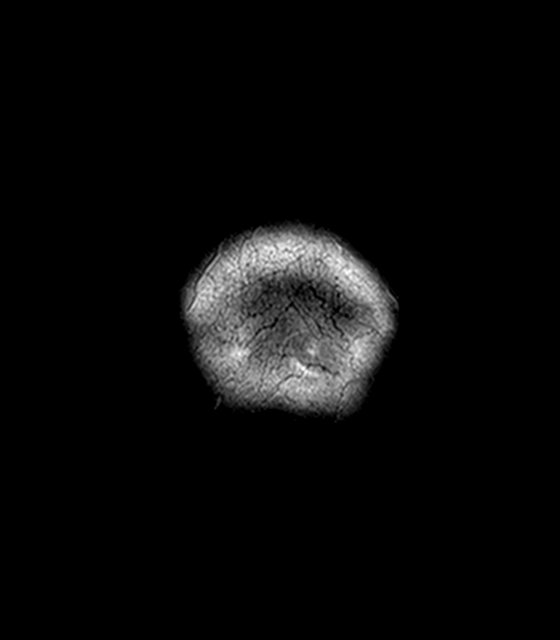

[Series 17: T1 · axial · 1.0mm · 0.94mm/px · z∈[-57,+101]mm · 7 of 160 slices shown (3 of 3)]
[im 1/160]
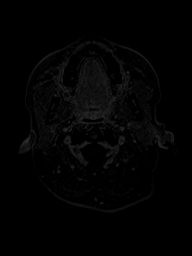
[im 27/160]
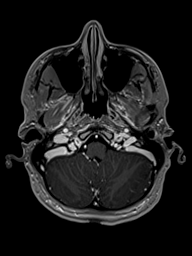
[im 54/160]
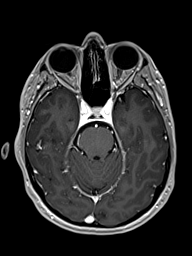
[im 80/160]
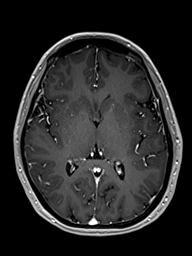
[im 107/160]
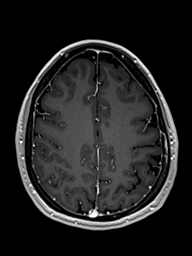
[im 133/160]
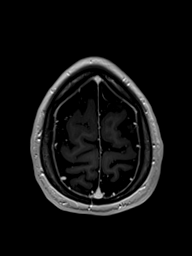
[im 160/160]
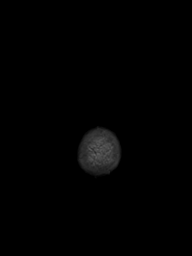

[Series 18: T1 post-contrast · coronal · 4.0mm · 0.72mm/px · 2 of 35 slices shown]
[im 1/35]
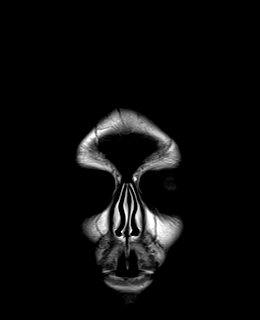
[im 35/35]
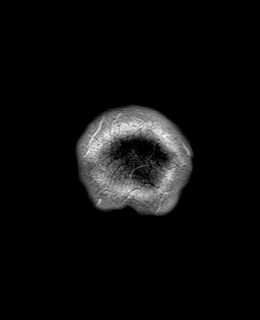

[48 of 48 positions shown; findings below may reference images not displayed]

FINDINGS: Brain: There is no evidence of acute intracranial hemorrhage,
extra-axial fluid collection, or infarct.

The ventricles are normal in size. There is no parenchymal signal
abnormality. There is no mass lesion. There is no midline shift.
There is no abnormal enhancement.

Vascular: Normal flow voids.

Skull and upper cervical spine: Normal marrow signal.

Sinuses/Orbits: The paranasal sinuses are clear. The globes and
orbits are unremarkable.

Other: None.
IMPRESSION: No evidence of intracranial metastatic disease or other acute
intracranial pathology.

## 2021-07-03 MED ORDER — GADOBENATE DIMEGLUMINE 529 MG/ML IV SOLN
13.0000 mL | Freq: Once | INTRAVENOUS | Status: AC | PRN
  Administered 2021-07-03: 13 mL via INTRAVENOUS

## 2021-07-04 ENCOUNTER — Encounter (HOSPITAL_BASED_OUTPATIENT_CLINIC_OR_DEPARTMENT_OTHER): Payer: Self-pay | Admitting: General Surgery

## 2021-07-04 ENCOUNTER — Telehealth: Payer: Self-pay | Admitting: Emergency Medicine

## 2021-07-04 ENCOUNTER — Other Ambulatory Visit: Payer: Self-pay

## 2021-07-04 ENCOUNTER — Encounter: Payer: Self-pay | Admitting: *Deleted

## 2021-07-04 ENCOUNTER — Telehealth: Payer: Self-pay | Admitting: Hematology and Oncology

## 2021-07-04 NOTE — Telephone Encounter (Signed)
Received call back from patient who was confused on appointments, patient had an appointment with surgeon same day as CT scan, got all appointments moved and squared away, patient is satisfied with new appointment dates and times

## 2021-07-04 NOTE — Telephone Encounter (Signed)
Left message in reference to upcoming appointments for CT and Bone scan, left my call back as well as the navigators as well if she has any questions

## 2021-07-04 NOTE — Telephone Encounter (Signed)
[id number] - TREATMENT OF REFRACTORY NAUSEA  Called pt to f/u on potential interest in study, pt states she is interested in participating.  Pt is returning on 07/07/21 to [facility] for education and injection appt, agreed to be seen after these appts by Research to discuss study further/sign consent.  Pt aware to call back beforehand with any questions/concerns.  Wells Guiles 'Learta CoddingNeysa Bonito, RN, BSN Clinical Research Nurse I 07/04/21 1:09 PM

## 2021-07-05 ENCOUNTER — Other Ambulatory Visit: Payer: Self-pay | Admitting: *Deleted

## 2021-07-05 ENCOUNTER — Encounter: Payer: Self-pay | Admitting: *Deleted

## 2021-07-05 DIAGNOSIS — Z170 Estrogen receptor positive status [ER+]: Secondary | ICD-10-CM

## 2021-07-05 NOTE — Progress Notes (Signed)
Patient Care Team: Dian Queen, MD as PCP - General (Obstetrics and Gynecology) Dian Queen, MD (Obstetrics and Gynecology) Mauro Kaufmann, RN as Oncology Nurse Navigator Rockwell Germany, RN as Oncology Nurse Navigator  DIAGNOSIS:    ICD-10-CM   1. Malignant neoplasm of upper-outer quadrant of right breast in female, estrogen receptor positive ([place])  C50.411    Z17.0       SUMMARY OF ONCOLOGIC HISTORY: Oncology History  Malignant neoplasm of upper-outer quadrant of right breast in female, estrogen receptor positive ([place])  06/23/2021 Initial Diagnosis   Palpable lump in the right breast. Diagnostic mammogram and [place]: a 3.8 cm mass at the retroareolar right breast, 1 prominent right axillary lymph node with a 4 mm cortex, 1 other which is borderline measuring 3 mm. 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  3 o'clock position: Biopsy fibroadenoma   07/12/2021 -  Chemotherapy    Patient is on Treatment Plan: BREAST  DOCETAXEL + CARBOPLATIN + TRASTUZUMAB + PERTUZUMAB  (TCHP) Q21D          CHIEF COMPLIANT: Follow-up of right breast cancer  INTERVAL HISTORY: Jenna Cross is a 35 y.o. with above-mentioned history of right breast cancer. She presents to the clinic today for follow-up.  She is anxious and ready to get started with her chemotherapy.  She had breast MRI which showed more extensive disease in the breast.  Brain MRI was negative.  She is waiting for the results of the CT scans and bone scans as well as echocardiogram.  ALLERGIES:  has No Known Allergies.  MEDICATIONS:  Current Outpatient Medications  Medication Sig Dispense Refill   acetaminophen (TYLENOL) 500 MG tablet Take 1,000 mg by mouth every 6 (six) hours as needed for mild pain or headache.     cetirizine (ZYRTEC) 10 MG tablet Take 10 mg by mouth at bedtime.     dexamethasone (DECADRON) 4 MG tablet Take 1 tablet (4 mg total)  by mouth daily. Take 1 tablet day before chemo and 1 tablet day after chemo with food 12 tablet 0   escitalopram (LEXAPRO) 20 MG tablet Take 20 mg by mouth daily.     ferrous sulfate 325 (65 FE) MG EC tablet Take 1 tablet (325 mg total) by mouth 2 (two) times daily. 60 tablet 2   ibuprofen (ADVIL) 600 MG tablet Take 1 tablet (600 mg total) by mouth every 6 (six) hours as needed. 30 tablet 0   lidocaine-prilocaine (EMLA) cream Apply to affected area once 30 g 3   LORazepam (ATIVAN) 0.5 MG tablet Take 1 tablet (0.5 mg total) by mouth at bedtime as needed (Nausea or vomiting). 30 tablet 0   ondansetron (ZOFRAN) 8 MG tablet Take 1 tablet (8 mg total) by mouth 2 (two) times daily as needed (Nausea or vomiting). Start on the third day after chemotherapy. 30 tablet 1   prochlorperazine (COMPAZINE) 10 MG tablet Take 1 tablet (10 mg total) by mouth every 6 (six) hours as needed (Nausea or vomiting). 30 tablet 1   valACYclovir (VALTREX) 500 MG tablet Take 500 mg by mouth 2 (two) times daily.     No current facility-administered medications for this visit.    PHYSICAL EXAMINATION: ECOG PERFORMANCE STATUS: 1 - Symptomatic but completely ambulatory  Vitals:   07/06/21 0825  BP: 120/60  Pulse: 93  Resp: 18  Temp: 98.1 F (36.7 C)  SpO2: 100%   Filed Weights   07/06/21 0825  Weight: 143 lb 11.2 oz (65.2 kg)      LABORATORY DATA:  I have reviewed the data as listed CMP Latest Ref Rng & Units 07/06/2021 04/05/2016  Glucose 70 - 99 mg/dL 106(H) 83  BUN 6 - 20 mg/dL 9 12  Creatinine 0.44 - 1.00 mg/dL 0.84 0.83  Sodium 135 - 145 mmol/L 141 140  Potassium 3.5 - 5.1 mmol/L 4.1 4.5  Chloride 98 - 111 mmol/L 107 100  CO2 22 - 32 mmol/L 23 19  Calcium 8.9 - 10.3 mg/dL 9.2 9.3  Total Protein 6.5 - 8.1 g/dL 6.9 7.2  Total Bilirubin 0.3 - 1.2 mg/dL 0.8 0.5  Alkaline Phos 38 - 126 U/L 83 68  AST 15 - 41 U/L 16 16  ALT 0 - 44 U/L 12 10    Lab Results  Component Value Date   WBC 5.2 07/06/2021    HGB 14.3 07/06/2021   HCT 41.5 07/06/2021   MCV 92.4 07/06/2021   PLT 268 07/06/2021   NEUTROABS 2.3 07/06/2021    ASSESSMENT & PLAN:  Malignant neoplasm of upper-outer quadrant of right breast in female, estrogen receptor positive ([facility]) Palpable lump in the right breast. Diagnostic mammogram and [place]: a 3.8 cm mass at the retroareolar right breast, 1 prominent right axillary lymph node with a 4 mm cortex, 1 other which is borderline measuring 3 mm. 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  3 o'clock position: Biopsy fibroadenoma  Treatment plan: 1. Neoadjuvant chemotherapy with TCH Perjeta 6 cycles followed by Herceptin Perjeta maintenance versus Kadcyla maintenance (based on response to neoadjuvant chemo) for 1 year 2. Followed by bilateral mastectomies with sentinel lymph node study 3. Followed by adjuvant radiation therapy  4.  Followed by antiestrogen therapy 5.  Followed by neratinib  Breast MRI 06/28/2021: Confluent mass right breast 11.1 x 7.8 x 9.5 cm, 1 enlarged lymph node and second borderline lymph node in the right axilla Brain MRI 07/03/2021: No evidence of malignancy. CT CAP and bone scan: Pending URCC nausea study ------------------------------------------------------------------------------------------------------------------------------------ Current treatment: Cycle 1 day 1 TCHP to start 07/12/2021 (Dignicap) Labs reviewed, chemo education completed, chemo consent obtained, antiemetics were reviewed Echocardiogram: Pending I reviewed the images of the breast MRI. Return to clinic in 1 week after chemo for toxicity check    No orders of the defined types were placed in this encounter.  The patient has a good understanding of the overall plan. she agrees with it. she will call with any problems that may develop before the next visit here.  Total time spent: 30 mins including face to face time  and time spent for planning, charting and coordination of care  Rulon Eisenmenger, MD, MPH 07/06/2021  I, Thana Ates, am acting as scribe for Dr. Nicholas Lose.  I have reviewed the above documentation for accuracy and completeness, and I agree with the above.

## 2021-07-05 NOTE — Progress Notes (Signed)
Pharmacist Chemotherapy Monitoring - Initial Assessment    Anticipated start date: 07/12/21   The following has been reviewed per standard work regarding the patient's treatment regimen: The patient's diagnosis, treatment plan and drug doses, and organ/hematologic function Lab orders and baseline tests specific to treatment regimen  The treatment plan start date, drug sequencing, and pre-medications Prior authorization status  Patient's documented medication list, including drug-drug interaction screen and prescriptions for anti-emetics and supportive care specific to the treatment regimen The drug concentrations, fluid compatibility, administration routes, and timing of the medications to be used The patient's access for treatment and lifetime cumulative dose history, if applicable  The patient's medication allergies and previous infusion related reactions, if applicable   Changes made to treatment plan:  N/A  Follow up needed:  adding lab orders - urine pregnancy needed? ECHO sched for 07/07/21 Port placement sched for 10/4 Need inj appt for 06/14/21?    Kennith Center, Pharm.D., CPP 07/05/2021@12 :17 PM

## 2021-07-06 ENCOUNTER — Inpatient Hospital Stay (HOSPITAL_BASED_OUTPATIENT_CLINIC_OR_DEPARTMENT_OTHER): Payer: BC Managed Care – PPO | Admitting: Hematology and Oncology

## 2021-07-06 ENCOUNTER — Other Ambulatory Visit: Payer: Self-pay

## 2021-07-06 ENCOUNTER — Inpatient Hospital Stay: Payer: BC Managed Care – PPO

## 2021-07-06 ENCOUNTER — Ambulatory Visit (HOSPITAL_COMMUNITY): Payer: BC Managed Care – PPO

## 2021-07-06 DIAGNOSIS — C50411 Malignant neoplasm of upper-outer quadrant of right female breast: Secondary | ICD-10-CM

## 2021-07-06 DIAGNOSIS — Z170 Estrogen receptor positive status [ER+]: Secondary | ICD-10-CM

## 2021-07-06 DIAGNOSIS — Z5111 Encounter for antineoplastic chemotherapy: Secondary | ICD-10-CM | POA: Diagnosis not present

## 2021-07-06 LAB — COMPREHENSIVE METABOLIC PANEL
ALT: 12 U/L (ref 0–44)
AST: 16 U/L (ref 15–41)
Albumin: 4.1 g/dL (ref 3.5–5.0)
Alkaline Phosphatase: 83 U/L (ref 38–126)
Anion gap: 11 (ref 5–15)
BUN: 9 mg/dL (ref 6–20)
CO2: 23 mmol/L (ref 22–32)
Calcium: 9.2 mg/dL (ref 8.9–10.3)
Chloride: 107 mmol/L (ref 98–111)
Creatinine, Ser: 0.84 mg/dL (ref 0.44–1.00)
GFR, Estimated: >60 mL/min (ref >60)
Glucose, Bld: 106 mg/dL — ABNORMAL HIGH (ref 70–99)
Potassium: 4.1 mmol/L (ref 3.5–5.1)
Sodium: 141 mmol/L (ref 135–145)
Total Bilirubin: 0.8 mg/dL (ref 0.3–1.2)
Total Protein: 6.9 g/dL (ref 6.5–8.1)

## 2021-07-06 LAB — CBC WITH DIFFERENTIAL/PLATELET
Abs Immature Granulocytes: 0.01 10*3/uL (ref 0.00–0.07)
Basophils Absolute: 0 10*3/uL (ref 0.0–0.1)
Basophils Relative: 1 %
Eosinophils Absolute: 0.2 10*3/uL (ref 0.0–0.5)
Eosinophils Relative: 4 %
HCT: 41.5 % (ref 36.0–46.0)
Hemoglobin: 14.3 g/dL (ref 12.0–15.0)
Immature Granulocytes: 0 %
Lymphocytes Relative: 43 %
Lymphs Abs: 2.2 10*3/uL (ref 0.7–4.0)
MCH: 31.8 pg (ref 26.0–34.0)
MCHC: 34.5 g/dL (ref 30.0–36.0)
MCV: 92.4 fL (ref 80.0–100.0)
Monocytes Absolute: 0.4 10*3/uL (ref 0.1–1.0)
Monocytes Relative: 7 %
Neutro Abs: 2.3 10*3/uL (ref 1.7–7.7)
Neutrophils Relative %: 45 %
Platelets: 268 10*3/uL (ref 150–400)
RBC: 4.49 MIL/uL (ref 3.87–5.11)
RDW: 12.1 % (ref 11.5–15.5)
WBC: 5.2 10*3/uL (ref 4.0–10.5)
nRBC: 0 % (ref 0.0–0.2)

## 2021-07-06 LAB — PREGNANCY, URINE: Preg Test, Ur: NEGATIVE

## 2021-07-06 NOTE — Assessment & Plan Note (Signed)
Palpable lump in the right breast. Diagnostic mammogram and [place]: a 3.8 cm mass at the retroareolar right breast, 1 prominent right axillary lymph node with a 4 mm cortex, 1 other which is borderline measuring 3 mm. 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  3 o'clock position: Biopsy fibroadenoma  Treatment plan: 1. Neoadjuvant chemotherapy with TCH Perjeta 6 cycles followed by Herceptin Perjeta maintenance versus Kadcyla maintenance (based on response to neoadjuvant chemo) for 1 year 2. Followed by breast conserving surgery if possible with sentinel lymph node study 3. Followed by adjuvant radiation therapy if patient had lumpectomy   Breast MRI 06/28/2021: Confluent mass right breast 11.1 x 7.8 x 9.5 cm, 1 enlarged lymph node and second borderline lymph node in the right axilla Brain MRI 07/03/2021: No evidence of malignancy. CT CAP and bone scan: Pending URCC nausea study ------------------------------------------------------------------------------------------------------------------------------------ Current treatment: Cycle 1 day 1 TCHP to start 07/07/2021 (Dignicap) Labs reviewed, chemo education completed, chemo consent obtained, antiemetics were reviewed Echocardiogram: Pending Return to clinic in 1 week after chemo for toxicity check

## 2021-07-07 ENCOUNTER — Inpatient Hospital Stay: Payer: BC Managed Care – PPO

## 2021-07-07 ENCOUNTER — Encounter: Payer: Self-pay | Admitting: Hematology and Oncology

## 2021-07-07 ENCOUNTER — Telehealth: Payer: Self-pay | Admitting: Adult Health

## 2021-07-07 ENCOUNTER — Other Ambulatory Visit: Payer: BC Managed Care – PPO

## 2021-07-07 ENCOUNTER — Ambulatory Visit (HOSPITAL_COMMUNITY)
Admission: RE | Admit: 2021-07-07 | Discharge: 2021-07-07 | Disposition: A | Discharge status: Home / Self Care | Payer: BC Managed Care – PPO | Source: Ambulatory Visit | Attending: Hematology and Oncology | Admitting: Hematology and Oncology

## 2021-07-07 ENCOUNTER — Other Ambulatory Visit: Payer: Self-pay | Admitting: *Deleted

## 2021-07-07 ENCOUNTER — Inpatient Hospital Stay: Payer: BC Managed Care – PPO | Admitting: Emergency Medicine

## 2021-07-07 ENCOUNTER — Other Ambulatory Visit: Payer: Self-pay

## 2021-07-07 VITALS — BP 123/96 | HR 76 | Temp 98.4°F | Resp 15

## 2021-07-07 DIAGNOSIS — Z5111 Encounter for antineoplastic chemotherapy: Secondary | ICD-10-CM | POA: Diagnosis not present

## 2021-07-07 DIAGNOSIS — C50411 Malignant neoplasm of upper-outer quadrant of right female breast: Secondary | ICD-10-CM

## 2021-07-07 DIAGNOSIS — Z01818 Encounter for other preprocedural examination: Secondary | ICD-10-CM | POA: Diagnosis present

## 2021-07-07 DIAGNOSIS — Z0189 Encounter for other specified special examinations: Secondary | ICD-10-CM

## 2021-07-07 DIAGNOSIS — Z170 Estrogen receptor positive status [ER+]: Secondary | ICD-10-CM

## 2021-07-07 LAB — ECHOCARDIOGRAM COMPLETE
Area-P 1/2: 2.52 cm2
S' Lateral: 3.3 cm

## 2021-07-07 MED ORDER — GOSERELIN ACETATE 3.6 MG ~~LOC~~ IMPL
3.6000 mg | DRUG_IMPLANT | Freq: Once | SUBCUTANEOUS | Status: AC
  Administered 2021-07-07: 3.6 mg via SUBCUTANEOUS
  Filled 2021-07-07: qty 3.6

## 2021-07-07 NOTE — Research (Signed)
[id number] - TREATMENT OF REFRACTORY NAUSEA  Spoke with patient today regarding study interest.  Patient is interested in study but has had a long visit with chemo education today already and would like to reschedule her study consent appt to Monday after her scans at 0800.  Appt created and pt aware to come to [facility] after imaging on Monday (07/10/21) to see Clinical Research Nurse.  Wells Guiles 'Learta CoddingNeysa Bonito, RN, BSN Clinical Research Nurse I 07/07/21 1:19 PM

## 2021-07-07 NOTE — Telephone Encounter (Signed)
Additional phone call to insurance company and authorization received.  Approval sent to our pre-auth team.    Time spent: 10 minutes  Wilber Bihari, NP

## 2021-07-07 NOTE — Progress Notes (Signed)
Echocardiogram 2D Echocardiogram has been performed.  Oneal Deputy Jalaine Riggenbach RDCS 07/07/2021, 9:50 AM

## 2021-07-07 NOTE — Telephone Encounter (Signed)
Spent approximately 65 minutes on the phone waiting for per to peer to discuss Goserelin approval for patient.  Unfortunately at minute 65 the call was unexpected terminated by the insurance company.    Wilber Bihari, NP  Time spent: 65 minutes

## 2021-07-07 NOTE — Progress Notes (Signed)
Met with patient at registration to introduce myself as Financial Resource Specialist and to offer available resources.  Discussed one-time $1000 [employer] grant and qualifications to assist with personal expenses while going through treatment.  Gave her my card if interested in applying and for any additional financial questions or concerns.  

## 2021-07-10 ENCOUNTER — Inpatient Hospital Stay: Payer: BC Managed Care – PPO | Admitting: Emergency Medicine

## 2021-07-10 ENCOUNTER — Encounter (HOSPITAL_BASED_OUTPATIENT_CLINIC_OR_DEPARTMENT_OTHER)
Admission: RE | Admit: 2021-07-10 | Discharge: 2021-07-10 | Disposition: A | Discharge status: Home / Self Care | Payer: BC Managed Care – PPO | Source: Ambulatory Visit | Attending: General Surgery | Admitting: General Surgery

## 2021-07-10 ENCOUNTER — Other Ambulatory Visit: Payer: Self-pay

## 2021-07-10 ENCOUNTER — Telehealth: Payer: Self-pay | Admitting: Emergency Medicine

## 2021-07-10 ENCOUNTER — Other Ambulatory Visit: Payer: Self-pay | Admitting: *Deleted

## 2021-07-10 ENCOUNTER — Ambulatory Visit (HOSPITAL_COMMUNITY)
Admission: RE | Admit: 2021-07-10 | Discharge: 2021-07-10 | Disposition: A | Discharge status: Home / Self Care | Payer: BC Managed Care – PPO | Source: Ambulatory Visit | Attending: Hematology and Oncology | Admitting: Hematology and Oncology

## 2021-07-10 ENCOUNTER — Encounter (HOSPITAL_COMMUNITY): Payer: Self-pay

## 2021-07-10 DIAGNOSIS — C50411 Malignant neoplasm of upper-outer quadrant of right female breast: Secondary | ICD-10-CM | POA: Insufficient documentation

## 2021-07-10 DIAGNOSIS — Z79899 Other long term (current) drug therapy: Secondary | ICD-10-CM | POA: Diagnosis not present

## 2021-07-10 DIAGNOSIS — C50811 Malignant neoplasm of overlapping sites of right female breast: Secondary | ICD-10-CM | POA: Diagnosis not present

## 2021-07-10 DIAGNOSIS — Z170 Estrogen receptor positive status [ER+]: Secondary | ICD-10-CM | POA: Insufficient documentation

## 2021-07-10 DIAGNOSIS — Z01812 Encounter for preprocedural laboratory examination: Secondary | ICD-10-CM | POA: Insufficient documentation

## 2021-07-10 DIAGNOSIS — Z452 Encounter for adjustment and management of vascular access device: Secondary | ICD-10-CM | POA: Diagnosis not present

## 2021-07-10 HISTORY — DX: Malignant (primary) neoplasm, unspecified: C80.1

## 2021-07-10 LAB — POCT PREGNANCY, URINE: Preg Test, Ur: NEGATIVE

## 2021-07-10 MED ORDER — IOHEXOL 350 MG/ML SOLN
80.0000 mL | Freq: Once | INTRAVENOUS | Status: AC | PRN
  Administered 2021-07-10: 80 mL via INTRAVENOUS

## 2021-07-10 MED ORDER — ENSURE PRE-SURGERY PO LIQD: 296.0000 mL | Freq: Once | ORAL | Status: DC

## 2021-07-10 NOTE — Telephone Encounter (Signed)
[id number] - TREATMENT OF REFRACTORY NAUSEA  Called pt regarding missed appt this am for Research.  Discussed study further with patient who decided to decline study participation at this time.  Pt denies any further questions/concerns at this time.  Will f/u with patient during her infusion appt on 07/12/21 to introduce DCP-001 study.  Wells Guiles 'Learta CoddingNeysa Bonito, RN, BSN Clinical Research Nurse I 07/10/21 9:51 AM

## 2021-07-10 NOTE — Progress Notes (Signed)

## 2021-07-10 NOTE — Progress Notes (Signed)
Sent text reminding pt to come in for lab work.  

## 2021-07-11 ENCOUNTER — Other Ambulatory Visit (HOSPITAL_COMMUNITY): Payer: BC Managed Care – PPO

## 2021-07-11 ENCOUNTER — Encounter (HOSPITAL_BASED_OUTPATIENT_CLINIC_OR_DEPARTMENT_OTHER)
Admission: RE | Disposition: A | Discharge status: Home / Self Care | Payer: Self-pay | Source: Home / Self Care | Attending: General Surgery

## 2021-07-11 ENCOUNTER — Ambulatory Visit (HOSPITAL_COMMUNITY): Payer: BC Managed Care – PPO

## 2021-07-11 ENCOUNTER — Encounter: Payer: Self-pay | Admitting: *Deleted

## 2021-07-11 ENCOUNTER — Ambulatory Visit (HOSPITAL_BASED_OUTPATIENT_CLINIC_OR_DEPARTMENT_OTHER): Payer: BC Managed Care – PPO | Admitting: Anesthesiology

## 2021-07-11 ENCOUNTER — Ambulatory Visit (HOSPITAL_BASED_OUTPATIENT_CLINIC_OR_DEPARTMENT_OTHER)
Admission: RE | Admit: 2021-07-11 | Discharge: 2021-07-11 | Disposition: A | Discharge status: Home / Self Care | Payer: BC Managed Care – PPO | Attending: General Surgery | Admitting: General Surgery

## 2021-07-11 ENCOUNTER — Other Ambulatory Visit: Payer: Self-pay

## 2021-07-11 ENCOUNTER — Encounter (HOSPITAL_BASED_OUTPATIENT_CLINIC_OR_DEPARTMENT_OTHER): Payer: Self-pay | Admitting: General Surgery

## 2021-07-11 DIAGNOSIS — Z79899 Other long term (current) drug therapy: Secondary | ICD-10-CM | POA: Insufficient documentation

## 2021-07-11 DIAGNOSIS — C50811 Malignant neoplasm of overlapping sites of right female breast: Secondary | ICD-10-CM | POA: Insufficient documentation

## 2021-07-11 DIAGNOSIS — Z452 Encounter for adjustment and management of vascular access device: Secondary | ICD-10-CM | POA: Insufficient documentation

## 2021-07-11 DIAGNOSIS — Z419 Encounter for procedure for purposes other than remedying health state, unspecified: Secondary | ICD-10-CM

## 2021-07-11 DIAGNOSIS — Z170 Estrogen receptor positive status [ER+]: Secondary | ICD-10-CM | POA: Insufficient documentation

## 2021-07-11 HISTORY — DX: Anemia, unspecified: D64.9

## 2021-07-11 HISTORY — PX: PORTACATH PLACEMENT: SHX2246

## 2021-07-11 IMAGING — RF DG CHEST 1V
1 series · 1 of 1 positions shown · non-contrast
Comparison: No priors.

CLINICAL DATA: 34-year-old female status post Port-A-Cath
placement.

EXAM:
CHEST  1 VIEW

[Series 1: run · 1 of 1 slices shown]
[im 1/1]
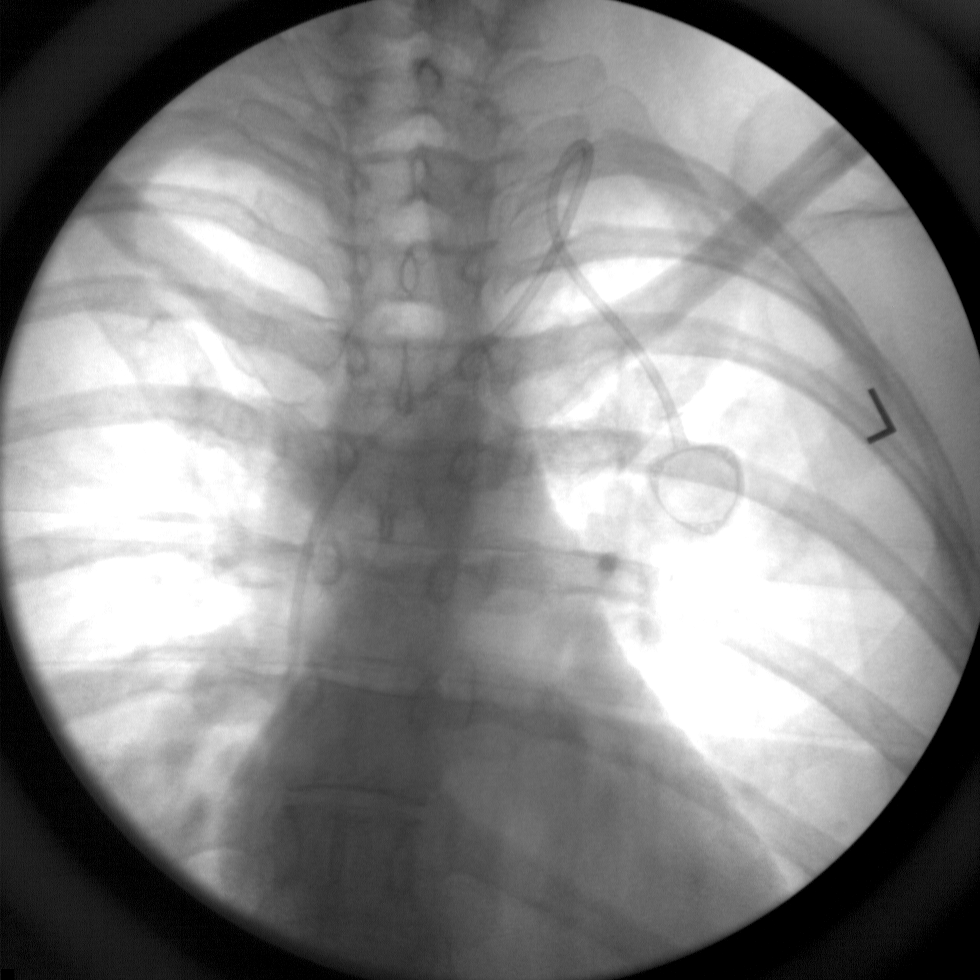

[1 of 1 positions shown; findings below may reference images not displayed]

FINDINGS: Fluoroscopic spot view of the upper thorax is very limited,
demonstrating placement of a left-sided internal jugular Port-A-Cath
with tip terminating in the distal superior vena cava. Study is
insufficient to evaluate for postprocedural pneumothorax or other
complications.
IMPRESSION: 1. Left-sided internal jugular Port-A-Cath with tip terminating in
the distal superior vena cava.

## 2021-07-11 SURGERY — INSERTION, TUNNELED CENTRAL VENOUS DEVICE, WITH PORT
Anesthesia: General | Site: Breast

## 2021-07-11 MED ORDER — HEPARIN (PORCINE) IN NACL 2-0.9 UNITS/ML
INTRAMUSCULAR | Status: AC | PRN
  Administered 2021-07-11: 1

## 2021-07-11 MED ORDER — 0.9 % SODIUM CHLORIDE (POUR BTL) OPTIME
TOPICAL | Status: DC | PRN
  Administered 2021-07-11: 50 mL

## 2021-07-11 MED ORDER — OXYCODONE HCL 5 MG/5ML PO SOLN: 5.0000 mg | Freq: Once | ORAL | Status: DC | PRN

## 2021-07-11 MED ORDER — HEPARIN SOD (PORK) LOCK FLUSH 100 UNIT/ML IV SOLN
INTRAVENOUS | Status: AC
  Filled 2021-07-11: qty 5

## 2021-07-11 MED ORDER — HEPARIN SOD (PORK) LOCK FLUSH 100 UNIT/ML IV SOLN
INTRAVENOUS | Status: DC | PRN
  Administered 2021-07-11: 500 [IU]

## 2021-07-11 MED ORDER — BUPIVACAINE HCL (PF) 0.25 % IJ SOLN
INTRAMUSCULAR | Status: DC | PRN
  Administered 2021-07-11: 7 mL

## 2021-07-11 MED ORDER — PROPOFOL 10 MG/ML IV BOLUS
INTRAVENOUS | Status: DC | PRN
  Administered 2021-07-11: 200 mg via INTRAVENOUS

## 2021-07-11 MED ORDER — FENTANYL CITRATE (PF) 100 MCG/2ML IJ SOLN
INTRAMUSCULAR | Status: AC
  Filled 2021-07-11: qty 2

## 2021-07-11 MED ORDER — ACETAMINOPHEN 500 MG PO TABS
ORAL_TABLET | ORAL | Status: AC
  Filled 2021-07-11: qty 2

## 2021-07-11 MED ORDER — EPHEDRINE SULFATE 50 MG/ML IJ SOLN
INTRAMUSCULAR | Status: DC | PRN
  Administered 2021-07-11: 15 mg via INTRAVENOUS

## 2021-07-11 MED ORDER — PHENYLEPHRINE HCL (PRESSORS) 10 MG/ML IV SOLN
INTRAVENOUS | Status: DC | PRN
  Administered 2021-07-11: 40 ug via INTRAVENOUS

## 2021-07-11 MED ORDER — MIDAZOLAM HCL 2 MG/2ML IJ SOLN
INTRAMUSCULAR | Status: AC
  Filled 2021-07-11: qty 2

## 2021-07-11 MED ORDER — LIDOCAINE HCL (CARDIAC) PF 100 MG/5ML IV SOSY
PREFILLED_SYRINGE | INTRAVENOUS | Status: DC | PRN
  Administered 2021-07-11: 60 mg via INTRAVENOUS

## 2021-07-11 MED ORDER — ONDANSETRON HCL 4 MG/2ML IJ SOLN: 4.0000 mg | Freq: Once | INTRAMUSCULAR | Status: DC | PRN

## 2021-07-11 MED ORDER — LACTATED RINGERS IV SOLN: INTRAVENOUS | Status: DC

## 2021-07-11 MED ORDER — FENTANYL CITRATE (PF) 100 MCG/2ML IJ SOLN: 25.0000 ug | INTRAMUSCULAR | Status: DC | PRN

## 2021-07-11 MED ORDER — OXYCODONE HCL 5 MG PO TABS: 5.0000 mg | ORAL_TABLET | Freq: Once | ORAL | Status: DC | PRN

## 2021-07-11 MED ORDER — ACETAMINOPHEN 500 MG PO TABS
1000.0000 mg | ORAL_TABLET | ORAL | Status: AC
  Administered 2021-07-11: 1000 mg via ORAL

## 2021-07-11 MED ORDER — ONDANSETRON HCL 4 MG/2ML IJ SOLN
INTRAMUSCULAR | Status: DC | PRN
  Administered 2021-07-11: 4 mg via INTRAVENOUS

## 2021-07-11 MED ORDER — MIDAZOLAM HCL 5 MG/5ML IJ SOLN
INTRAMUSCULAR | Status: DC | PRN
  Administered 2021-07-11: 2 mg via INTRAVENOUS

## 2021-07-11 MED ORDER — AMISULPRIDE (ANTIEMETIC) 5 MG/2ML IV SOLN: 10.0000 mg | Freq: Once | INTRAVENOUS | Status: DC | PRN

## 2021-07-11 MED ORDER — CEFAZOLIN SODIUM-DEXTROSE 2-4 GM/100ML-% IV SOLN: 2.0000 g | INTRAVENOUS | Status: DC

## 2021-07-11 MED ORDER — CHLORHEXIDINE GLUCONATE CLOTH 2 % EX PADS: 6.0000 | MEDICATED_PAD | Freq: Once | CUTANEOUS | Status: DC

## 2021-07-11 MED ORDER — DEXAMETHASONE SODIUM PHOSPHATE 4 MG/ML IJ SOLN
INTRAMUSCULAR | Status: DC | PRN
  Administered 2021-07-11: 5 mg via INTRAVENOUS

## 2021-07-11 MED ORDER — TRAMADOL HCL 50 MG PO TABS: 100.0000 mg | ORAL_TABLET | Freq: Four times a day (QID) | ORAL | 0 refills | Status: DC | PRN

## 2021-07-11 MED ORDER — CEFAZOLIN SODIUM-DEXTROSE 2-4 GM/100ML-% IV SOLN
INTRAVENOUS | Status: AC
  Filled 2021-07-11: qty 100

## 2021-07-11 MED ORDER — FENTANYL CITRATE (PF) 100 MCG/2ML IJ SOLN
INTRAMUSCULAR | Status: DC | PRN
  Administered 2021-07-11: 100 ug via INTRAVENOUS

## 2021-07-11 MED FILL — Fosaprepitant Dimeglumine For IV Infusion 150 MG (Base Eq): INTRAVENOUS | Qty: 5 | Status: AC

## 2021-07-11 MED FILL — Dexamethasone Sodium Phosphate Inj 100 MG/10ML: INTRAMUSCULAR | Qty: 1 | Status: AC

## 2021-07-11 SURGICAL SUPPLY — 50 items
ADH SKN CLS APL DERMABOND .7 (GAUZE/BANDAGES/DRESSINGS) ×1
APL PRP STRL LF DISP 70% ISPRP (MISCELLANEOUS) ×1
APL SKNCLS STERI-STRIP NONHPOA (GAUZE/BANDAGES/DRESSINGS) ×1
BAG DECANTER FOR FLEXI CONT (MISCELLANEOUS) ×2 IMPLANT
BENZOIN TINCTURE PRP APPL 2/3 (GAUZE/BANDAGES/DRESSINGS) ×2 IMPLANT
BLADE SURG 11 STRL SS (BLADE) ×2 IMPLANT
BLADE SURG 15 STRL LF DISP TIS (BLADE) ×1 IMPLANT
BLADE SURG 15 STRL SS (BLADE) ×2
CANISTER SUCT 1200ML W/VALVE (MISCELLANEOUS) IMPLANT
CHLORAPREP W/TINT 26 (MISCELLANEOUS) ×2 IMPLANT
COVER BACK TABLE 60X90IN (DRAPES) ×2 IMPLANT
COVER MAYO STAND STRL (DRAPES) ×2 IMPLANT
COVER PROBE 5X48 (MISCELLANEOUS)
DECANTER SPIKE VIAL GLASS SM (MISCELLANEOUS) IMPLANT
DERMABOND ADVANCED (GAUZE/BANDAGES/DRESSINGS) ×1
DERMABOND ADVANCED .7 DNX12 (GAUZE/BANDAGES/DRESSINGS) ×1 IMPLANT
DRAPE C-ARM 42X72 X-RAY (DRAPES) ×2 IMPLANT
DRAPE LAPAROSCOPIC ABDOMINAL (DRAPES) ×2 IMPLANT
DRAPE UTILITY XL STRL (DRAPES) ×2 IMPLANT
DRSG TEGADERM 4X4.75 (GAUZE/BANDAGES/DRESSINGS) ×2 IMPLANT
ELECT COATED BLADE 2.86 ST (ELECTRODE) ×2 IMPLANT
ELECT REM PT RETURN 9FT ADLT (ELECTROSURGICAL) ×2
ELECTRODE REM PT RTRN 9FT ADLT (ELECTROSURGICAL) ×1 IMPLANT
GAUZE SPONGE 4X4 12PLY STRL LF (GAUZE/BANDAGES/DRESSINGS) ×2 IMPLANT
GLOVE SURG ENC MOIS LTX SZ7 (GLOVE) ×2 IMPLANT
GLOVE SURG UNDER POLY LF SZ7.5 (GLOVE) ×2 IMPLANT
GOWN STRL REUS W/ TWL LRG LVL3 (GOWN DISPOSABLE) ×2 IMPLANT
GOWN STRL REUS W/TWL LRG LVL3 (GOWN DISPOSABLE) ×4
IV KIT MINILOC 20X1 SAFETY (NEEDLE) IMPLANT
KIT CVR 48X5XPRB PLUP LF (MISCELLANEOUS) IMPLANT
KIT PORT POWER 8FR ISP CVUE (Port) ×1 IMPLANT
NDL HYPO 25X1 1.5 SAFETY (NEEDLE) ×1 IMPLANT
NDL SAFETY ECLIPSE 18X1.5 (NEEDLE) IMPLANT
NEEDLE HYPO 18GX1.5 SHARP (NEEDLE)
NEEDLE HYPO 25X1 1.5 SAFETY (NEEDLE) ×2 IMPLANT
PACK BASIN DAY SURGERY FS (CUSTOM PROCEDURE TRAY) ×2 IMPLANT
PENCIL SMOKE EVACUATOR (MISCELLANEOUS) ×2 IMPLANT
SLEEVE SCD COMPRESS KNEE MED (STOCKING) ×2 IMPLANT
STRIP CLOSURE SKIN 1/2X4 (GAUZE/BANDAGES/DRESSINGS) ×2 IMPLANT
SUT MNCRL AB 4-0 PS2 18 (SUTURE) ×2 IMPLANT
SUT PROLENE 2 0 SH DA (SUTURE) ×2 IMPLANT
SUT SILK 2 0 TIES 17X18 (SUTURE)
SUT SILK 2-0 18XBRD TIE BLK (SUTURE) IMPLANT
SUT VIC AB 3-0 SH 27 (SUTURE) ×2
SUT VIC AB 3-0 SH 27X BRD (SUTURE) ×1 IMPLANT
SYR 5ML LUER SLIP (SYRINGE) ×2 IMPLANT
SYR CONTROL 10ML LL (SYRINGE) ×2 IMPLANT
TOWEL GREEN STERILE FF (TOWEL DISPOSABLE) ×2 IMPLANT
TUBE CONNECTING 20X1/4 (TUBING) IMPLANT
YANKAUER SUCT BULB TIP NO VENT (SUCTIONS) IMPLANT

## 2021-07-11 NOTE — Anesthesia Procedure Notes (Signed)
Procedure Name: LMA Insertion Date/Time: 07/11/2021 11:29 AM Performed by: Willa Frater, CRNA Pre-anesthesia Checklist: Patient identified, Emergency Drugs available, Suction available and Patient being monitored Patient Re-evaluated:Patient Re-evaluated prior to induction Oxygen Delivery Method: Circle system utilized Preoxygenation: Pre-oxygenation with 100% oxygen Induction Type: IV induction Ventilation: Mask ventilation without difficulty LMA: LMA inserted LMA Size: 4.0 Number of attempts: 1 Airway Equipment and Method: Bite block Placement Confirmation: positive ETCO2 Tube secured with: Tape Dental Injury: Teeth and Oropharynx as per pre-operative assessment

## 2021-07-11 NOTE — Op Note (Signed)
Preoperative diagnosis: Clinical stage II right breast cancer Postoperative diagnosis: Same as above Procedure: Left internal jugular port placement Surgeon: Dr. Serita Grammes Anesthesia: General Estimated blood loss: Minimal Specimens: None Drains: None Complications: None Special count was correct completion Disposition recovery stable edition  Indications: 69 yof with newly diagnosed clinical stage II right breast cancer  that is her2 positive. She is due to begin chemotherapy tomorrow.   Procedure: After informed consent was obtained she was taken to the operating room.   She was given antibiotics.  SCDs were in place.  She was placed under general anesthesia without complication.  She was prepped and draped in a standard sterile surgical fashion.  Surgical timeout was then performed.  I then identified her left internal jugular vein with the ultrasound.  I made a small nick in the skin.  I then accessed this on the first pass with the needle.  The wire was placed.  This was confirmed to be in the vein by the ultrasound as well as by fluoroscopy.  I then placed local into the area below her clavicle where the pocket was made.  I made incision and developed a pocket.  I then tunneled the line between the 2 sites.  The dilator was then placed over the wire.  This was done using fluoroscopy.  The wire was then removed.  The line was placed through the sheath and the sheath removed.  I pulled the line back to be in the distal cava near the cavoatrial junction.  This was a good position upon completion.  I then hooked the port up to the line.  I sutured this position with 2-0 Prolene suture.  I then accessed the port and left accessed for chemotherapy tomorrow.   Aspirated blood and flushed easily. The line does go medial to insertion site in the neck which is where I placed it.  I placed heparin in the port.  I then got a final completion film that showed this all to be in good position.  I then  closed with 3-0 Vicryl 4-0 Monocryl.  Glue was placed.  She tolerated this well and was transferred to recovery.

## 2021-07-11 NOTE — Discharge Instructions (Addendum)
Post Anesthesia Home Care Instructions  Activity: Get plenty of rest for the remainder of the day. A responsible individual must stay with you for 24 hours following the procedure.  For the next 24 hours, DO NOT: -Drive a car -Paediatric nurse -Drink alcoholic beverages -Take any medication unless instructed by your physician -Make any legal decisions or sign important papers.  Meals: Start with liquid foods such as gelatin or soup. Progress to regular foods as tolerated. Avoid greasy, spicy, heavy foods. If nausea and/or vomiting occur, drink only clear liquids until the nausea and/or vomiting subsides. Call your physician if vomiting continues.  Special Instructions/Symptoms: Your throat may feel dry or sore from the anesthesia or the breathing tube placed in your throat during surgery. If this causes discomfort, gargle with warm salt water. The discomfort should disappear within 24 hours.  If you had a scopolamine patch placed behind your ear for the management of post- operative nausea and/or vomiting:  1. The medication in the patch is effective for 72 hours, after which it should be removed.  Wrap patch in a tissue and discard in the trash. Wash hands thoroughly with soap and water. 2. You may remove the patch earlier than 72 hours if you experience unpleasant side effects which may include dry mouth, dizziness or visual disturbances. 3. Avoid touching the patch. Wash your hands with soap and water after contact with the patch.        PORT-A-CATH: POST OP INSTRUCTIONS  Always review your discharge instruction sheet given to you by the facility where your surgery was performed.   A prescription for pain medication may be given to you upon discharge. Take your pain medication as prescribed, if needed. If narcotic pain medicine is not needed, then you make take acetaminophen (Tylenol) or ibuprofen (Advil) as needed.  Take your usually prescribed medications unless otherwise  directed. If you need a refill on your pain medication, please contact our office. All narcotic pain medicine now requires a paper prescription.  Phoned in and fax refills are no longer allowed by law.  Prescriptions will not be filled after 5 pm or on weekends.  You should follow a light diet for the remainder of the day after your procedure. Most patients will experience some mild swelling and/or bruising in the area of the incision. It may take several days to resolve. It is common to experience some constipation if taking pain medication after surgery. Increasing fluid intake and taking a stool softener (such as Colace) will usually help or prevent this problem from occurring. A mild laxative (Milk of Magnesia or Miralax) should be taken according to package directions if there are no bowel movements after 48 hours.  Unless discharge instructions indicate otherwise, you may remove your bandages 48 hours after surgery, and you may shower at that time. You may have steri-strips (small white skin tapes) in place directly over the incision.  These strips should be left on the skin for 7-10 days.  If your surgeon used Dermabond (skin glue) on the incision, you may shower in 24 hours.  The glue will flake off over the next 2-3 weeks.  If your port is left accessed at the end of surgery (needle left in port), the dressing cannot get wet and should only by changed by a healthcare professional. When the port is no longer accessed (when the needle has been removed), follow step 7.   ACTIVITIES:  Limit activity involving your arms for the next 72 hours. Do no  strenuous exercise or activity for 1 week. You may drive when you are no longer taking prescription pain medication, you can comfortably wear a seatbelt, and you can maneuver your car. 10.You may need to see your doctor in the office for a follow-up appointment.  Please       check with your doctor.  11.When you receive a new Port-a-Cath, you will get a  product guide and        ID card.  Please keep them in case you need them.  WHEN TO CALL YOUR DOCTOR [phone]): Fever over 101.0 Chills Continued bleeding from incision Increased redness and tenderness at the site Shortness of breath, difficulty breathing   The clinic staff is available to answer your questions during regular business hours. Please don't hesitate to call and ask to speak to one of the nurses or medical assistants for clinical concerns. If you have a medical emergency, go to the nearest emergency room or call 911.  A surgeon from [facility] is always on call at the hospital.     For further information, please visit [email or web address]    Next dose of Tylenol can be given after 3:55 PM.

## 2021-07-11 NOTE — Anesthesia Preprocedure Evaluation (Signed)
Anesthesia Evaluation  Patient identified by MRN, date of birth, ID band Patient awake    Reviewed: Allergy & Precautions, NPO status , Patient's Chart, lab work & pertinent test results  History of Anesthesia Complications Negative for: history of anesthetic complications  Airway Mallampati: II  TM Distance: >3 FB Neck ROM: Full    Dental  (+) Teeth Intact, Dental Advisory Given   Pulmonary neg pulmonary ROS, former smoker,    Pulmonary exam normal        Cardiovascular negative cardio ROS Normal cardiovascular exam     Neuro/Psych Anxiety negative neurological ROS     GI/Hepatic negative GI ROS, Neg liver ROS,   Endo/Other  negative endocrine ROS  Renal/GU negative Renal ROS  negative genitourinary   Musculoskeletal negative musculoskeletal ROS (+)   Abdominal   Peds  Hematology negative hematology ROS (+)   Anesthesia Other Findings  Breast cancer  Reproductive/Obstetrics                             Anesthesia Physical Anesthesia Plan  ASA: 2  Anesthesia Plan: General   Post-op Pain Management:    Induction: Intravenous  PONV Risk Score and Plan: 3 and Ondansetron, Dexamethasone, Midazolam and Treatment may vary due to age or medical condition  Airway Management Planned: LMA  Additional Equipment: None  Intra-op Plan:   Post-operative Plan: Extubation in OR  Informed Consent: I have reviewed the patients History and Physical, chart, labs and discussed the procedure including the risks, benefits and alternatives for the proposed anesthesia with the patient or authorized representative who has indicated his/her understanding and acceptance.     Dental advisory given  Plan Discussed with:   Anesthesia Plan Comments:         Anesthesia Quick Evaluation

## 2021-07-11 NOTE — H&P (Signed)
35 y.o. female who is seen today as an office consultation at the request of Dr. Lindi Adie for evaluation of Breast Cancer .  Otherwise healthy female (here with her dad Dr Julus Kelley Saras) was seen for possible lift by plastic surgery and was thought to have a mass. She has no dc. She then went for a mm/us. She was found to have a 3.8 cm right retroareolar mass, there was also one prominent node with a 4 mm cortex and another that is borderline. Biopsy of a node is positive. 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 She has undergone an MRI that shows a right breast mass that measures up to 11.1 cm and encompasses the whole breast. The other breast and nodes are fine, there are a couple right ax nodes. She has MR of brain due to HA that is negative. Has other staging scans planned soon. She has no discharge. She has seen Dr Lindi Adie this morning.  Review of Systems: A complete review of systems was obtained from the patient. I have reviewed this information and discussed as appropriate with the patient. See HPI as well for other ROS.  Review of Systems  All other systems reviewed and are negative.   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  Past Surgical History:  Procedure Laterality Date   BREAST EXCISIONAL BIOPSY  FAs    No Known Allergies  Current Outpatient Medications on File Prior to Visit  Medication Sig Dispense Refill   ALPRAZolam (XANAX) 0.5 MG tablet alprazolam 0.5 mg tablet TAKE 1 TABLET BY MOUTH EVERY 6 HOURS AS NEEDED FOR ANXIETY   escitalopram oxalate (LEXAPRO) 10 MG tablet escitalopram 10 mg tablet TAKE 1 TABLET BY MOUTH EVERY DAY   valACYclovir (VALTREX) 500 MG tablet valacyclovir 500 mg tablet TAKE 1 TABLET BY MOUTH EVERY DAY   No current facility-administered  medications on file prior to visit.   Family History  Problem Relation Age of Onset   Prostate cancer Maternal Grandfather    Social History   Tobacco Use  Smoking Status Never Smoker  Smokeless Tobacco Never Used    Social History   Socioeconomic History   Marital status: Married  Tobacco Use   Smoking status: Never Smoker   Smokeless tobacco: Never Used  Substance and Sexual Activity   Alcohol use: Not Currently   Drug use: Not Currently   Objective:   Vitals:  07/06/21 1638  BP: 110/68  Pulse: 98  Weight: 64.9 kg (143 lb)  Height: 162.6 cm (5\' 4" )   Body mass index is 24.55 kg/m.  Physical Exam Constitutional:  Appearance: Normal appearance.  Chest:  Breasts:  Right: Mass present. No inverted nipple or nipple discharge.  Left: No inverted nipple, mass or nipple discharge.   Comments: Large right breast mass measuring at least 6 cm mobile, there are no overlying skin changes Lymphadenopathy:  Upper Body:  Right upper body: No supraclavicular or axillary adenopathy.  Left upper body: No supraclavicular or axillary adenopathy.  Neurological:  Mental Status: She is alert.   Assessment and Plan:   Cancer of overlapping sites of right breast (CMS-HCC)  Primary chemotherapy, port placement next week, genetics, surgery following primary chemotherapy- I think this is likely going to be a  mastectomy  We discussed the staging and pathophysiology of breast cancer. We discussed all of the different options for treatment for breast cancer including surgery, chemotherapy, radiation therapy, Herceptin, and antiestrogen therapy. She is a small percentage of lobular cancers that are her 2 positive. She certainly needs chemotherapy/anti her 2 therapy and I think doing this primarily is best. I dont think this will likely change surgery but might facilitate a nsm more for her. I also think it will hopefully allow less nodal surgery for her given her young age.  We  discussed likely a TAD at time of surgery I will have her go do L-Dex score now with PT to follow postoperatively as well. We discussed the options for treatment of the breast cancer which included lumpectomy versus a mastectomy. I do think she will need mastectomy eventually but will wait until end of chemo to decide I also discussed left IJ port placement with her today which I will do next week

## 2021-07-11 NOTE — Interval H&P Note (Signed)
History and Physical Interval Note:  07/11/2021 11:19 AM  Jenna Cross  has presented today for surgery, with the diagnosis of BREAST CANCER.  The various methods of treatment have been discussed with the patient and family. After consideration of risks, benefits and other options for treatment, the patient has consented to  Procedure(s): INSERTION PORT-A-CATH (N/A) as a surgical intervention.  The patient's history has been reviewed, patient examined, no change in status, stable for surgery.  I have reviewed the patient's chart and labs.  Questions were answered to the patient's satisfaction.     Rolm Bookbinder

## 2021-07-11 NOTE — Anesthesia Postprocedure Evaluation (Signed)
Anesthesia Post Note  Patient: Jenna Cross  Procedure(s) Performed: INSERTION PORT-A-CATH (Breast)     Patient location during evaluation: PACU Anesthesia Type: General Level of consciousness: awake and alert Pain management: pain level controlled Vital Signs Assessment: post-procedure vital signs reviewed and stable Respiratory status: spontaneous breathing, nonlabored ventilation and respiratory function stable Cardiovascular status: blood pressure returned to baseline and stable Postop Assessment: no apparent nausea or vomiting Anesthetic complications: no   No notable events documented.  Last Vitals:  Vitals:   07/11/21 1245 07/11/21 1305  BP: (!) 113/59 (!) 114/56  Pulse: 69 83  Resp: 19 18  Temp:  36.8 C  SpO2: 100%     Last Pain:  Vitals:   07/11/21 1305  TempSrc: Oral  PainSc: 2                  Lidia Collum

## 2021-07-11 NOTE — Transfer of Care (Signed)
Immediate Anesthesia Transfer of Care Note  Patient: Jenna Cross  Procedure(s) Performed: INSERTION PORT-A-CATH (Breast)  Patient Location: PACU  Anesthesia Type:General  Level of Consciousness: awake, alert , oriented, drowsy and patient cooperative  Airway & Oxygen Therapy: Patient Spontanous Breathing and Patient connected to face mask oxygen  Post-op Assessment: Report given to RN and Post -op Vital signs reviewed and stable  Post vital signs: Reviewed and stable  Last Vitals:  Vitals Value Taken Time  BP    Temp    Pulse    Resp    SpO2      Last Pain:  Vitals:   07/11/21 0950  TempSrc: Oral  PainSc: 0-No pain         Complications: No notable events documented.

## 2021-07-12 ENCOUNTER — Ambulatory Visit: Payer: BC Managed Care – PPO | Admitting: Hematology and Oncology

## 2021-07-12 ENCOUNTER — Encounter (HOSPITAL_BASED_OUTPATIENT_CLINIC_OR_DEPARTMENT_OTHER): Payer: Self-pay | Admitting: General Surgery

## 2021-07-12 ENCOUNTER — Inpatient Hospital Stay: Payer: BC Managed Care – PPO

## 2021-07-12 ENCOUNTER — Encounter: Payer: Self-pay | Admitting: *Deleted

## 2021-07-12 ENCOUNTER — Other Ambulatory Visit: Payer: BC Managed Care – PPO

## 2021-07-12 ENCOUNTER — Inpatient Hospital Stay: Payer: BC Managed Care – PPO | Admitting: Emergency Medicine

## 2021-07-12 VITALS — BP 115/68 | HR 74 | Temp 98.4°F | Resp 16 | Wt 145.4 lb

## 2021-07-12 DIAGNOSIS — Z23 Encounter for immunization: Secondary | ICD-10-CM | POA: Insufficient documentation

## 2021-07-12 DIAGNOSIS — Z5111 Encounter for antineoplastic chemotherapy: Secondary | ICD-10-CM | POA: Insufficient documentation

## 2021-07-12 DIAGNOSIS — Z170 Estrogen receptor positive status [ER+]: Secondary | ICD-10-CM

## 2021-07-12 DIAGNOSIS — C50411 Malignant neoplasm of upper-outer quadrant of right female breast: Secondary | ICD-10-CM | POA: Insufficient documentation

## 2021-07-12 DIAGNOSIS — Z5112 Encounter for antineoplastic immunotherapy: Secondary | ICD-10-CM | POA: Insufficient documentation

## 2021-07-12 DIAGNOSIS — R293 Abnormal posture: Secondary | ICD-10-CM | POA: Insufficient documentation

## 2021-07-12 DIAGNOSIS — Z5189 Encounter for other specified aftercare: Secondary | ICD-10-CM | POA: Insufficient documentation

## 2021-07-12 MED ORDER — SODIUM CHLORIDE 0.9 % IV SOLN
75.0000 mg/m2 | Freq: Once | INTRAVENOUS | Status: AC
  Administered 2021-07-12: 130 mg via INTRAVENOUS
  Filled 2021-07-12: qty 13

## 2021-07-12 MED ORDER — SODIUM CHLORIDE 0.9 % IV SOLN: Freq: Once | INTRAVENOUS | Status: AC

## 2021-07-12 MED ORDER — PALONOSETRON HCL INJECTION 0.25 MG/5ML
0.2500 mg | Freq: Once | INTRAVENOUS | Status: AC
  Administered 2021-07-12: 0.25 mg via INTRAVENOUS
  Filled 2021-07-12: qty 5

## 2021-07-12 MED ORDER — SODIUM CHLORIDE 0.9 % IV SOLN
420.0000 mg | Freq: Once | INTRAVENOUS | Status: AC
  Administered 2021-07-12: 420 mg via INTRAVENOUS
  Filled 2021-07-12: qty 14

## 2021-07-12 MED ORDER — SODIUM CHLORIDE 0.9 % IV SOLN
700.0000 mg | Freq: Once | INTRAVENOUS | Status: AC
  Administered 2021-07-12: 700 mg via INTRAVENOUS
  Filled 2021-07-12: qty 70

## 2021-07-12 MED ORDER — SODIUM CHLORIDE 0.9 % IV SOLN
10.0000 mg | Freq: Once | INTRAVENOUS | Status: AC
  Administered 2021-07-12: 10 mg via INTRAVENOUS
  Filled 2021-07-12: qty 10

## 2021-07-12 MED ORDER — TRASTUZUMAB-DKST CHEMO 150 MG IV SOLR
8.0000 mg/kg | Freq: Once | INTRAVENOUS | Status: AC
  Administered 2021-07-12: 525 mg via INTRAVENOUS
  Filled 2021-07-12: qty 25

## 2021-07-12 MED ORDER — SODIUM CHLORIDE 0.9% FLUSH
10.0000 mL | INTRAVENOUS | Status: DC | PRN
  Administered 2021-07-12: 10 mL

## 2021-07-12 MED ORDER — SODIUM CHLORIDE 0.9 % IV SOLN
150.0000 mg | Freq: Once | INTRAVENOUS | Status: DC
  Filled 2021-07-12: qty 5

## 2021-07-12 MED ORDER — DIPHENHYDRAMINE HCL 25 MG PO CAPS
25.0000 mg | ORAL_CAPSULE | Freq: Once | ORAL | Status: AC
  Administered 2021-07-12: 25 mg via ORAL
  Filled 2021-07-12: qty 1

## 2021-07-12 MED ORDER — HEPARIN SOD (PORK) LOCK FLUSH 100 UNIT/ML IV SOLN
500.0000 [IU] | Freq: Once | INTRAVENOUS | Status: AC | PRN
  Administered 2021-07-12: 500 [IU]

## 2021-07-12 MED ORDER — ACETAMINOPHEN 325 MG PO TABS
650.0000 mg | ORAL_TABLET | Freq: Once | ORAL | Status: AC
  Administered 2021-07-12: 650 mg via ORAL
  Filled 2021-07-12: qty 2

## 2021-07-12 MED ORDER — SODIUM CHLORIDE 0.9 % IV SOLN
150.0000 mg | Freq: Once | INTRAVENOUS | Status: AC
  Administered 2021-07-12: 150 mg via INTRAVENOUS
  Filled 2021-07-12: qty 150

## 2021-07-12 NOTE — Patient Instructions (Addendum)
[facility]  Discharge Instructions: Thank you for choosing [facility] to provide your oncology and hematology care.   If you have a lab appointment with the [facility], please go directly to the [facility] and check in at the registration area.   Wear comfortable clothing and clothing appropriate for easy access to any Portacath or PICC line.   We strive to give you quality time with your provider. You may need to reschedule your appointment if you arrive late (15 or more minutes).  Arriving late affects you and other patients whose appointments are after yours.  Also, if you miss three or more appointments without notifying the office, you may be dismissed from the clinic at the provider's discretion.      For prescription refill requests, have your pharmacy contact our office and allow 72 hours for refills to be completed.    Today you received the following chemotherapy and/or immunotherapy agents Trastuzumab-dkst, Pertuzumab, Docetaxel, and Carboplatin      To help prevent nausea and vomiting after your treatment, we encourage you to take your nausea medication as directed.  BELOW ARE SYMPTOMS THAT SHOULD BE REPORTED IMMEDIATELY: *FEVER GREATER THAN 100.4 F (38 C) OR HIGHER *CHILLS OR SWEATING *NAUSEA AND VOMITING THAT IS NOT CONTROLLED WITH YOUR NAUSEA MEDICATION *UNUSUAL SHORTNESS OF BREATH *UNUSUAL BRUISING OR BLEEDING *URINARY PROBLEMS (pain or burning when urinating, or frequent urination) *BOWEL PROBLEMS (unusual diarrhea, constipation, pain near the anus) TENDERNESS IN MOUTH AND THROAT WITH OR WITHOUT PRESENCE OF ULCERS (sore throat, sores in mouth, or a toothache) UNUSUAL RASH, SWELLING OR PAIN  UNUSUAL VAGINAL DISCHARGE OR ITCHING   Items with * indicate a potential emergency and should be followed up as soon as possible or go to the Emergency Department if any problems should occur.  Please show the CHEMOTHERAPY ALERT  CARD or IMMUNOTHERAPY ALERT CARD at check-in to the Emergency Department and triage nurse.  Should you have questions after your visit or need to cancel or reschedule your appointment, please contact [facility]  Dept: [phone]  and follow the prompts.  Office hours are 8:00 a.m. to 4:30 p.m. Monday - Friday. Please note that voicemails left after 4:00 p.m. may not be returned until the following business day.  We are closed weekends and major holidays. You have access to a nurse at all times for urgent questions. Please call the main number to the clinic Dept: [phone] and follow the prompts.   For any non-urgent questions, you may also contact your provider using MyChart. We now offer e-Visits for anyone 36 and older to request care online for non-urgent symptoms. For details visit [email or web address].   Also download the MyChart app! Go to the app store, search "MyChart", open the app, select [facility], and log in with your MyChart username and password.  Due to Covid, a mask is required upon entering the hospital/clinic. If you do not have a mask, one will be given to you upon arrival. For doctor visits, patients may have 1 support person aged 71 or older with them. For treatment visits, patients cannot have anyone with them due to current Covid guidelines and our immunocompromised population.   Trastuzumab injection for infusion What is this medication? TRASTUZUMAB (tras TOO zoo mab) is a monoclonal antibody. It is used to treat breast cancer and stomach cancer. This medicine may be used for other purposes; ask your health care provider or pharmacist if you have  questions. COMMON BRAND NAME(S): Herceptin, Janae Bridgeman, Ontruzant, Trazimera What should I tell my care team before I take this medication? They need to know if you have any of these conditions: heart disease heart failure lung or breathing disease, like asthma an unusual or  allergic reaction to trastuzumab, benzyl alcohol, or other medications, foods, dyes, or preservatives pregnant or trying to get pregnant breast-feeding How should I use this medication? This drug is given as an infusion into a vein. It is administered in a hospital or clinic by a specially trained health care professional. Talk to your pediatrician regarding the use of this medicine in children. This medicine is not approved for use in children. Overdosage: If you think you have taken too much of this medicine contact a poison control center or emergency room at once. NOTE: This medicine is only for you. Do not share this medicine with others. What if I miss a dose? It is important not to miss a dose. Call your doctor or health care professional if you are unable to keep an appointment. What may interact with this medication? This medicine may interact with the following medications: certain types of chemotherapy, such as daunorubicin, doxorubicin, epirubicin, and idarubicin This list may not describe all possible interactions. Give your health care provider a list of all the medicines, herbs, non-prescription drugs, or dietary supplements you use. Also tell them if you smoke, drink alcohol, or use illegal drugs. Some items may interact with your medicine. What should I watch for while using this medication? Visit your doctor for checks on your progress. Report any side effects. Continue your course of treatment even though you feel ill unless your doctor tells you to stop. Call your doctor or health care professional for advice if you get a fever, chills or sore throat, or other symptoms of a cold or flu. Do not treat yourself. Try to avoid being around people who are sick. You may experience fever, chills and shaking during your first infusion. These effects are usually mild and can be treated with other medicines. Report any side effects during the infusion to your health care professional. Fever  and chills usually do not happen with later infusions. Do not become pregnant while taking this medicine or for 7 months after stopping it. Women should inform their doctor if they wish to become pregnant or think they might be pregnant. Women of child-bearing potential will need to have a negative pregnancy test before starting this medicine. There is a potential for serious side effects to an unborn child. Talk to your health care professional or pharmacist for more information. Do not breast-feed an infant while taking this medicine or for 7 months after stopping it. Women must use effective birth control with this medicine. What side effects may I notice from receiving this medication? Side effects that you should report to your doctor or health care professional as soon as possible: allergic reactions like skin rash, itching or hives, swelling of the face, lips, or tongue chest pain or palpitations cough dizziness feeling faint or lightheaded, falls fever general ill feeling or flu-like symptoms signs of worsening heart failure like breathing problems; swelling in your legs and feet unusually weak or tired Side effects that usually do not require medical attention (report to your doctor or health care professional if they continue or are bothersome): bone pain changes in taste diarrhea joint pain nausea/vomiting weight loss This list may not describe all possible side effects. Call your doctor for medical  advice about side effects. You may report side effects to FDA at 1-800-FDA-1088. Where should I keep my medication? This drug is given in a hospital or clinic and will not be stored at home. NOTE: This sheet is a summary. It may not cover all possible information. If you have questions about this medicine, talk to your doctor, pharmacist, or health care provider.  2022 Elsevier/Gold Standard (2016-09-18 14:37:52)  Pertuzumab injection What is this medication? PERTUZUMAB (per TOOZ  ue mab) is a monoclonal antibody. It is used to treat breast cancer. This medicine may be used for other purposes; ask your health care provider or pharmacist if you have questions. COMMON BRAND NAME(S): PERJETA What should I tell my care team before I take this medication? They need to know if you have any of these conditions: heart disease heart failure high blood pressure history of irregular heart beat recent or ongoing radiation therapy an unusual or allergic reaction to pertuzumab, other medicines, foods, dyes, or preservatives pregnant or trying to get pregnant breast-feeding How should I use this medication? This medicine is for infusion into a vein. It is given by a health care professional in a hospital or clinic setting. Talk to your pediatrician regarding the use of this medicine in children. Special care may be needed. Overdosage: If you think you have taken too much of this medicine contact a poison control center or emergency room at once. NOTE: This medicine is only for you. Do not share this medicine with others. What if I miss a dose? It is important not to miss your dose. Call your doctor or health care professional if you are unable to keep an appointment. What may interact with this medication? Interactions are not expected. Give your health care provider a list of all the medicines, herbs, non-prescription drugs, or dietary supplements you use. Also tell them if you smoke, drink alcohol, or use illegal drugs. Some items may interact with your medicine. This list may not describe all possible interactions. Give your health care provider a list of all the medicines, herbs, non-prescription drugs, or dietary supplements you use. Also tell them if you smoke, drink alcohol, or use illegal drugs. Some items may interact with your medicine. What should I watch for while using this medication? Your condition will be monitored carefully while you are receiving this medicine.  Report any side effects. Continue your course of treatment even though you feel ill unless your doctor tells you to stop. Do not become pregnant while taking this medicine or for 7 months after stopping it. Women should inform their doctor if they wish to become pregnant or think they might be pregnant. Women of child-bearing potential will need to have a negative pregnancy test before starting this medicine. There is a potential for serious side effects to an unborn child. Talk to your health care professional or pharmacist for more information. Do not breast-feed an infant while taking this medicine or for 7 months after stopping it. Women must use effective birth control with this medicine. Call your doctor or health care professional for advice if you get a fever, chills or sore throat, or other symptoms of a cold or flu. Do not treat yourself. Try to avoid being around people who are sick. You may experience fever, chills, and headache during the infusion. Report any side effects during the infusion to your health care professional. What side effects may I notice from receiving this medication? Side effects that you should report to your doctor  or health care professional as soon as possible: breathing problems chest pain or palpitations dizziness feeling faint or lightheaded fever or chills skin rash, itching or hives sore throat swelling of the face, lips, or tongue swelling of the legs or ankles unusually weak or tired Side effects that usually do not require medical attention (report to your doctor or health care professional if they continue or are bothersome): diarrhea hair loss nausea, vomiting tiredness This list may not describe all possible side effects. Call your doctor for medical advice about side effects. You may report side effects to FDA at 1-800-FDA-1088. Where should I keep my medication? This drug is given in a hospital or clinic and will not be stored at home. NOTE:  This sheet is a summary. It may not cover all possible information. If you have questions about this medicine, talk to your doctor, pharmacist, or health care provider.  2022 Elsevier/Gold Standard (2015-10-27 12:08:50)  Docetaxel injection What is this medication? DOCETAXEL (doe se TAX el) is a chemotherapy drug. It targets fast dividing cells, like cancer cells, and causes these cells to die. This medicine is used to treat many types of cancers like breast cancer, certain stomach cancers, head and neck cancer, lung cancer, and prostate cancer. This medicine may be used for other purposes; ask your health care provider or pharmacist if you have questions. COMMON BRAND NAME(S): Docefrez, Taxotere What should I tell my care team before I take this medication? They need to know if you have any of these conditions: infection (especially a virus infection such as chickenpox, cold sores, or herpes) liver disease low blood counts, like low white cell, platelet, or red cell counts an unusual or allergic reaction to docetaxel, polysorbate 80, other chemotherapy agents, other medicines, foods, dyes, or preservatives pregnant or trying to get pregnant breast-feeding How should I use this medication? This drug is given as an infusion into a vein. It is administered in a hospital or clinic by a specially trained health care professional. Talk to your pediatrician regarding the use of this medicine in children. Special care may be needed. Overdosage: If you think you have taken too much of this medicine contact a poison control center or emergency room at once. NOTE: This medicine is only for you. Do not share this medicine with others. What if I miss a dose? It is important not to miss your dose. Call your doctor or health care professional if you are unable to keep an appointment. What may interact with this medication? Do not take this medicine with any of the following medications: live virus  vaccines This medicine may also interact with the following medications: aprepitant certain antibiotics like erythromycin or clarithromycin certain antivirals for HIV or hepatitis certain medicines for fungal infections like fluconazole, itraconazole, ketoconazole, posaconazole, or voriconazole cimetidine ciprofloxacin conivaptan cyclosporine dronedarone fluvoxamine grapefruit juice imatinib verapamil This list may not describe all possible interactions. Give your health care provider a list of all the medicines, herbs, non-prescription drugs, or dietary supplements you use. Also tell them if you smoke, drink alcohol, or use illegal drugs. Some items may interact with your medicine. What should I watch for while using this medication? Your condition will be monitored carefully while you are receiving this medicine. You will need important blood work done while you are taking this medicine. Call your doctor or health care professional for advice if you get a fever, chills or sore throat, or other symptoms of a cold or flu. Do not treat  yourself. This drug decreases your body's ability to fight infections. Try to avoid being around people who are sick. Some products may contain alcohol. Ask your health care professional if this medicine contains alcohol. Be sure to tell all health care professionals you are taking this medicine. Certain medicines, like metronidazole and disulfiram, can cause an unpleasant reaction when taken with alcohol. The reaction includes flushing, headache, nausea, vomiting, sweating, and increased thirst. The reaction can last from 30 minutes to several hours. You may get drowsy or dizzy. Do not drive, use machinery, or do anything that needs mental alertness until you know how this medicine affects you. Do not stand or sit up quickly, especially if you are an older patient. This reduces the risk of dizzy or fainting spells. Alcohol may interfere with the effect of this  medicine. Talk to your health care professional about your risk of cancer. You may be more at risk for certain types of cancer if you take this medicine. Do not become pregnant while taking this medicine or for 6 months after stopping it. Women should inform their doctor if they wish to become pregnant or think they might be pregnant. There is a potential for serious side effects to an unborn child. Talk to your health care professional or pharmacist for more information. Do not breast-feed an infant while taking this medicine or for 1 week after stopping it. Males who get this medicine must use a condom during sex with females who can get pregnant. If you get a woman pregnant, the baby could have birth defects. The baby could die before they are born. You will need to continue wearing a condom for 3 months after stopping the medicine. Tell your health care provider right away if your partner becomes pregnant while you are taking this medicine. This may interfere with the ability to father a child. You should talk to your doctor or health care professional if you are concerned about your fertility. What side effects may I notice from receiving this medication? Side effects that you should report to your doctor or health care professional as soon as possible: allergic reactions like skin rash, itching or hives, swelling of the face, lips, or tongue blurred vision breathing problems changes in vision low blood counts - This drug may decrease the number of white blood cells, red blood cells and platelets. You may be at increased risk for infections and bleeding. nausea and vomiting pain, redness or irritation at site where injected pain, tingling, numbness in the hands or feet redness, blistering, peeling, or loosening of the skin, including inside the mouth signs of decreased platelets or bleeding - bruising, pinpoint red spots on the skin, black, tarry stools, nosebleeds signs of decreased red blood  cells - unusually weak or tired, fainting spells, lightheadedness signs of infection - fever or chills, cough, sore throat, pain or difficulty passing urine swelling of the ankle, feet, hands Side effects that usually do not require medical attention (report to your doctor or health care professional if they continue or are bothersome): constipation diarrhea fingernail or toenail changes hair loss loss of appetite mouth sores muscle pain This list may not describe all possible side effects. Call your doctor for medical advice about side effects. You may report side effects to FDA at 1-800-FDA-1088. Where should I keep my medication? This drug is given in a hospital or clinic and will not be stored at home. NOTE: This sheet is a summary. It may not cover all possible information.  If you have questions about this medicine, talk to your doctor, pharmacist, or health care provider.  2022 Elsevier/Gold Standard (2019-08-24 19:50:31)  Carboplatin injection What is this medication? CARBOPLATIN (KAR boe pla tin) is a chemotherapy drug. It targets fast dividing cells, like cancer cells, and causes these cells to die. This medicine is used to treat ovarian cancer and many other cancers. This medicine may be used for other purposes; ask your health care provider or pharmacist if you have questions. COMMON BRAND NAME(S): Paraplatin What should I tell my care team before I take this medication? They need to know if you have any of these conditions: blood disorders hearing problems kidney disease recent or ongoing radiation therapy an unusual or allergic reaction to carboplatin, cisplatin, other chemotherapy, other medicines, foods, dyes, or preservatives pregnant or trying to get pregnant breast-feeding How should I use this medication? This drug is usually given as an infusion into a vein. It is administered in a hospital or clinic by a specially trained health care professional. Talk to your  pediatrician regarding the use of this medicine in children. Special care may be needed. Overdosage: If you think you have taken too much of this medicine contact a poison control center or emergency room at once. NOTE: This medicine is only for you. Do not share this medicine with others. What if I miss a dose? It is important not to miss a dose. Call your doctor or health care professional if you are unable to keep an appointment. What may interact with this medication? medicines for seizures medicines to increase blood counts like filgrastim, pegfilgrastim, sargramostim some antibiotics like amikacin, gentamicin, neomycin, streptomycin, tobramycin vaccines Talk to your doctor or health care professional before taking any of these medicines: acetaminophen aspirin ibuprofen ketoprofen naproxen This list may not describe all possible interactions. Give your health care provider a list of all the medicines, herbs, non-prescription drugs, or dietary supplements you use. Also tell them if you smoke, drink alcohol, or use illegal drugs. Some items may interact with your medicine. What should I watch for while using this medication? Your condition will be monitored carefully while you are receiving this medicine. You will need important blood work done while you are taking this medicine. This drug may make you feel generally unwell. This is not uncommon, as chemotherapy can affect healthy cells as well as cancer cells. Report any side effects. Continue your course of treatment even though you feel ill unless your doctor tells you to stop. In some cases, you may be given additional medicines to help with side effects. Follow all directions for their use. Call your doctor or health care professional for advice if you get a fever, chills or sore throat, or other symptoms of a cold or flu. Do not treat yourself. This drug decreases your body's ability to fight infections. Try to avoid being around people  who are sick. This medicine may increase your risk to bruise or bleed. Call your doctor or health care professional if you notice any unusual bleeding. Be careful brushing and flossing your teeth or using a toothpick because you may get an infection or bleed more easily. If you have any dental work done, tell your dentist you are receiving this medicine. Avoid taking products that contain aspirin, acetaminophen, ibuprofen, naproxen, or ketoprofen unless instructed by your doctor. These medicines may hide a fever. Do not become pregnant while taking this medicine. Women should inform their doctor if they wish to become pregnant or think  they might be pregnant. There is a potential for serious side effects to an unborn child. Talk to your health care professional or pharmacist for more information. Do not breast-feed an infant while taking this medicine. What side effects may I notice from receiving this medication? Side effects that you should report to your doctor or health care professional as soon as possible: allergic reactions like skin rash, itching or hives, swelling of the face, lips, or tongue signs of infection - fever or chills, cough, sore throat, pain or difficulty passing urine signs of decreased platelets or bleeding - bruising, pinpoint red spots on the skin, black, tarry stools, nosebleeds signs of decreased red blood cells - unusually weak or tired, fainting spells, lightheadedness breathing problems changes in hearing changes in vision chest pain high blood pressure low blood counts - This drug may decrease the number of white blood cells, red blood cells and platelets. You may be at increased risk for infections and bleeding. nausea and vomiting pain, swelling, redness or irritation at the injection site pain, tingling, numbness in the hands or feet problems with balance, talking, walking trouble passing urine or change in the amount of urine Side effects that usually do not  require medical attention (report to your doctor or health care professional if they continue or are bothersome): hair loss loss of appetite metallic taste in the mouth or changes in taste This list may not describe all possible side effects. Call your doctor for medical advice about side effects. You may report side effects to FDA at 1-800-FDA-1088. Where should I keep my medication? This drug is given in a hospital or clinic and will not be stored at home. NOTE: This sheet is a summary. It may not cover all possible information. If you have questions about this medicine, talk to your doctor, pharmacist, or health care provider.  2022 Elsevier/Gold Standard (2007-12-30 14:38:05)  Fatigue If you have fatigue, you feel tired all the time and have a lack of energy or a lack of motivation. Fatigue may make it difficult to start or complete tasks because of exhaustion. In general, occasional or mild fatigue is often a normal response to activity or life. However, long-lasting (chronic) or extreme fatigue may be a symptom of a medical condition. Follow these instructions at home: General instructions Watch your fatigue for any changes. Go to bed and get up at the same time every day. Avoid fatigue by pacing yourself during the day and getting enough sleep at night. Maintain a healthy weight. Medicines Take over-the-counter and prescription medicines only as told by your health care provider. Take a multivitamin, if told by your health care provider.  Do not use herbal or dietary supplements unless they are approved by your health care provider. Activity  Exercise regularly, as told by your health care provider. Use or practice techniques to help you relax, such as yoga, tai chi, meditation, or massage therapy. Eating and drinking  Avoid heavy meals in the evening. Eat a well-balanced diet, which includes lean proteins, whole grains, plenty of fruits and vegetables, and low-fat dairy  products. Avoid consuming too much caffeine. Avoid the use of alcohol. Drink enough fluid to keep your urine pale yellow. Lifestyle Change situations that cause you stress. Try to keep your work and personal schedule in balance. Do not use any products that contain nicotine or tobacco, such as cigarettes and e-cigarettes. If you need help quitting, ask your health care provider. Do not use drugs. Contact a health care  provider if: Your fatigue does not get better. You have a fever. You suddenly lose or gain weight. You have headaches. You have trouble falling asleep or sleeping through the night. You feel angry, guilty, anxious, or sad. You are unable to have a bowel movement (constipation). Your skin is dry. You have swelling in your legs or another part of your body. Get help right away if: You feel confused. Your vision is blurry. You feel faint or you pass out. You have a severe headache. You have severe pain in your abdomen, your back, or the area between your waist and hips (pelvis). You have chest pain, shortness of breath, or an irregular or fast heartbeat. You are unable to urinate, or you urinate less than normal. You have abnormal bleeding, such as bleeding from the rectum, vagina, nose, lungs, or nipples. You vomit blood. You have thoughts about hurting yourself or others. If you ever feel like you may hurt yourself or others, or have thoughts about taking your own life, get help right away. You can go to your nearest emergency department or call: Your local emergency services (911 in the [place] A suicide crisis helpline, such as the [facility] at 715 108 0308. This is open 24 hours a day. Summary If you have fatigue, you feel tired all the time and have a lack of energy or a lack of motivation. Fatigue may make it difficult to start or complete tasks because of exhaustion. Long-lasting (chronic) or extreme fatigue may be a symptom of a  medical condition. Exercise regularly, as told by your health care provider. Change situations that cause you stress. Try to keep your work and personal schedule in balance. This information is not intended to replace advice given to you by your health care provider. Make sure you discuss any questions you have with your health care provider. Document Revised: 08/04/2020 Document Reviewed: 08/04/2020 Elsevier Patient Education  2022 [place] and Nutrition What you eat both during and after your cancer treatment plays a big part in your recovery. During treatment, eating well helps you stay strong, provides you with energy for tissue healing, and helps you fight infection. It may also help with side effects. When treatment ends and you start feeling better, eating well helps you rebuild tissue, gain strength and energy, and feel better overall. Choosing what is best to eat and drink during and after treatment can be a challenge. If you need help, ask to be referred to a registered dietitian. A dietitian can help you create a balanced eating plan. How to maintain healthy nutrition during cancer treatment What are tips for eating during treatment? During treatment, your tastes may change, so focus on foods that taste good to you. Eat your largest meal when you feel the most hungry. Limit drinking during the meal to increase food intake. Drink between meals to prevent dehydration. If you have lost your appetite or cannot keep food down, your cancer care team may suggest that you consume foods or drinks that have had nutrients added to them (are fortified) or are higher in calories, fat, and protein to help prevent weight loss. If you are still not able to meet your nutrition needs, you may benefit from other forms of nutrition support. Your care team may suggest appetite stimulants, tube feeding, or nutrition through an IV inserted into one of your veins. What foods should I  eat during treatment?  Appetite is often altered during treatment. Some will have low  appetite due to side effects of treatment. Others may gain weight due to medicines, less activity, and stress. Talk with a dietitian to help create the best eating plan for your situation. For a few meals each week, try eating plant-based foods that are high in protein, such as beans and peas, instead of meat. Include lots of whole grains, fruits, and vegetables. Eat at least 2 cups of fruits and vegetables a day. Include fresh fruits and dark-green and deep-yellow vegetables, which are full of natural, healthy substances. Remember to wash fresh produce thoroughly before eating to reduce any risk for infection. Throughout the day, eat small snacks that are high in calories and protein. High-protein snacks that are easy to prepare and eat include: Yogurt. Cereal and milk. Half of a sandwich. A bowl of hearty soup. Cheese and crackers. Avoid snacks that could make side effects from treatment worse. For instance, popcorn, raw fruits, and raw vegetables can make diarrhea worse. Dry, coarse snacks or foods high in acid can hurt your throat if it is already sore. Drink enough fluid to keep your urine pale yellow. Talk with your health care provider about taking any vitamins and supplements, as high doses of some can alter the effectiveness of treatment therapy. How to maintain healthy nutrition after cancer treatment The goals of your nutrition after cancer treatment are to achieve and maintain a healthy weight and to be physically active on a regular basis. What are tips for eating after treatment? After cancer treatment, eat plenty of proteins, carbohydrates, and fats. You can get each of these dietary elements from a wide range of foods. You may also want to eat a diet rich in plant-based foods because those have been shown to be beneficial for cancer survivors. What foods should I eat after treatment? Here are  some types of foods that you should include in your diet after treatment: Proteins. Protein is needed for growth, repairing body tissue and muscles, fighting infection, and helping to keep your body's defense system (immune system) healthy. When planning meals, choose lean proteins that do not have a lot of saturated fats. Include fish, lean meat, skinless poultry, eggs, nonfat and low-fat dairy products, nuts, seeds, and legumes (like beans, peas, and lentils) in your daily diet. Carbohydrates. Carbohydrates are your body's main source of energy. They provide the fuel you need for physical activity and for your organs to work well. Most of the carbohydrates in your diet should come from foods that are high in essential nutrients and fiber, such as vegetables, fruits, whole grains, and legumes. Fruits and vegetables. Eat at least 2-3 cups of vegetables and 1-2 cups of fruits each day. You may choose to eat them fresh, frozen, canned, raw, cooked, or dried. If you want to heat the fruits or vegetables, consider microwaving or steaming them rather than boiling them in water. Microwaving and steaming keep their nutrients better. Dairy. Choose low-fat milk and other low-fat dairy products. Fats and oils. Fats help boost energy in your body. Choose healthy fats from nuts, seeds, avocados, fatty fish, and vegetable oils, including olive, avocado, canola, peanut, safflower, grapeseed, and sesame. Beverages. All your body's cells need water to work well, so drink plenty of water. Drink about eight 8-oz glasses of liquid each day so your body's cells get the fluid they need. Keep in mind that foods such as soups, milk, and even ice cream count toward that goal. The items listed above may not be a complete list of foods and  beverages you can eat. Contact a dietitian for more information. What foods and beverages should I avoid or limit? Maintaining a healthy weight is important for life after treatment.  Check ingredients and nutrition facts on packaged foods and beverages. Talk with a registered dietitian to create a specific eating plan for you. Follow these recommendations for general healthy eating: Avoid beverages that are high in sugar, including juices. Avoiding these will help you stay at a healthy weight and help with blood sugar control. Limit high-fat foods, including fried foods and foods that come from animals. An example of a high-fat food from an animal is red meat, processed meats, or full-fat dairy. If you have diarrhea from treatment, fatty foods can make it worse. Limit foods that are high in added sugar, such as cookies, baked goods, and candy. Limit salt-cured, smoked, and pickled foods. Avoid alcohol. There is a link between alcohol and the risk of certain cancers. If you choose to drink alcohol, it is best to limit intake to 1 drink a day for nonpregnant women and 2 drinks a day for men. In the [place] one drink equals one 12 oz bottle of beer (355 mL), one 5 oz glass of wine (148 mL), or one 1 oz glass of hard liquor (44 mL). Depending on your cancer diagnosis, your health care provider may recommend that you do not drink alcohol at all. Limit foods that are high in saturated fats, such as cheese and butter. Saturated fats may raise your risk of cancer. Limit processed foods, such as packaged meals and snacks. The items listed above may not be a complete list of foods and beverages you should avoid. Contact a dietitian for more information. Summary Good nutrition plays a big part in healing and keeping your body strong during and after cancer treatment. Ask to meet with a registered dietitian. A dietitian can help you create an eating plan that works well for you. During treatment, it may be helpful to eat small meals and snacks that are high in calories and protein. After cancer treatment, eat plenty of proteins, carbohydrates, and fats. You may also want to eat a diet rich in  plant-based foods because those have been shown to be beneficial for cancer survivors. This information is not intended to replace advice given to you by your health care provider. Make sure you discuss any questions you have with your health care provider. Document Revised: 08/11/2019 Document Reviewed: 08/11/2019 Elsevier Patient Education  [place].

## 2021-07-12 NOTE — Research (Signed)
DCP-001: Use of a Clinical Trial Screening Tool to Address Cancer Health Disparities in the [facility])  Patient Jenna Cross was identified by this Clinical Research Nurse as a potential candidate for the above listed study.  This Clinical Research Nurse met with Jenna Cross, [id number], on 07/12/21 in a manner and location that ensures patient privacy to discuss participation in the above listed research study.  Patient is Unaccompanied.  A copy of the informed consent document and separate HIPAA Authorization was provided to the patient.  Patient reads, speaks, and understands [place].    Patient was provided with the business card of this Nurse and encouraged to contact the research team with any questions.  Patient was provided the option of taking informed consent documents home to review and was encouraged to review at their convenience with their support network, including other care providers. Patient is comfortable with making a decision regarding study participation today.  As outlined in the informed consent form, this Nurse and [name] discussed the purpose of the research study, the investigational nature of the study, study procedures and requirements for study participation, potential risks and benefits of study participation, as well as alternatives to participation. This study is not blinded. The patient understands participation is voluntary and they may withdraw from study participation at any time.  This study does not involve randomization.  This study does not involve an investigational drug or device. This study does not involve a placebo. Patient understands enrollment is pending full eligibility review.   Confidentiality and how the patient's information will be used as part of study participation were discussed.  Patient was informed there is not reimbursement provided for their time and effort spent on trial  participation.  The patient is encouraged to discuss research study participation with their insurance provider to determine what costs they may incur as part of study participation, including research related injury.    All questions were answered to patient's satisfaction.  The informed consent and separate HIPAA Authorization was reviewed page by page.  The patient's mental and emotional status is appropriate to provide informed consent, and the patient verbalizes an understanding of study participation.  Patient has agreed to participate in the above listed research study and has voluntarily signed the informed consent version date 03/31/2019 with embedded HIPAA language, version 5  on 07/12/21 at 11:17 AM.  The patient was provided with a copy of the signed informed consent form and separate HIPAA Authorization for their reference.  No study specific procedures were obtained prior to the signing of the informed consent document.  Approximately 30 minutes were spent with the patient reviewing the informed consent documents.  Patient was not requested to complete a Release of Information form.  Wells Guiles 'Learta CoddingNeysa Bonito, RN, BSN Clinical Research Nurse I 07/12/21 12:04 PM

## 2021-07-12 NOTE — Addendum Note (Signed)
Addendum  created 07/12/21 1314 by Fitzpatrick Alberico, Ernesta Amble, CRNA   Charge Capture section accepted

## 2021-07-13 ENCOUNTER — Ambulatory Visit (HOSPITAL_COMMUNITY)
Admission: RE | Admit: 2021-07-13 | Discharge: 2021-07-13 | Disposition: A | Discharge status: Home / Self Care | Payer: BC Managed Care – PPO | Source: Ambulatory Visit | Attending: Hematology and Oncology | Admitting: Hematology and Oncology

## 2021-07-13 ENCOUNTER — Other Ambulatory Visit: Payer: Self-pay

## 2021-07-13 DIAGNOSIS — C50411 Malignant neoplasm of upper-outer quadrant of right female breast: Secondary | ICD-10-CM | POA: Diagnosis present

## 2021-07-13 DIAGNOSIS — Z170 Estrogen receptor positive status [ER+]: Secondary | ICD-10-CM | POA: Insufficient documentation

## 2021-07-13 DIAGNOSIS — R293 Abnormal posture: Secondary | ICD-10-CM | POA: Diagnosis present

## 2021-07-13 IMAGING — NM NM BONE WHOLE BODY
2 series · 2 of 2 positions shown · non-contrast
Comparison: [DATE]

CLINICAL DATA: Breast cancer, staging

EXAM:
NUCLEAR MEDICINE WHOLE BODY BONE SCAN
TECHNIQUE: Whole body anterior and posterior images were obtained approximately
3 hours after intravenous injection of radiopharmaceutical.
RADIOPHARMACEUTICALS:  19.5 mCi [O6] MDP IV

[Series 1: whole body · 2.66mm/px · 1 of 1 slices shown (1 of 2)]
[im 1/1]
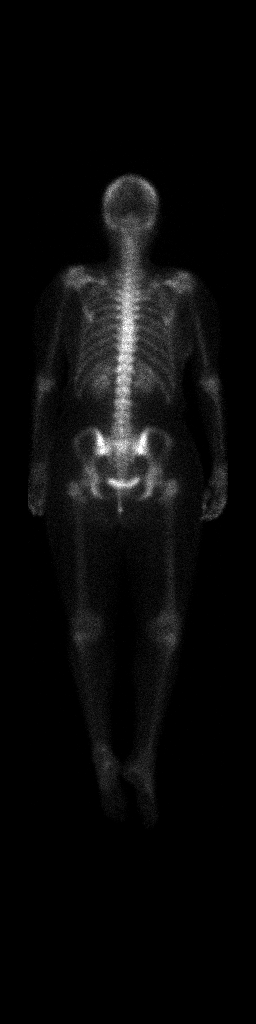

[Series 1: whole body · 2.66mm/px · 1 of 1 slices shown (2 of 2)]
[im 1/1]
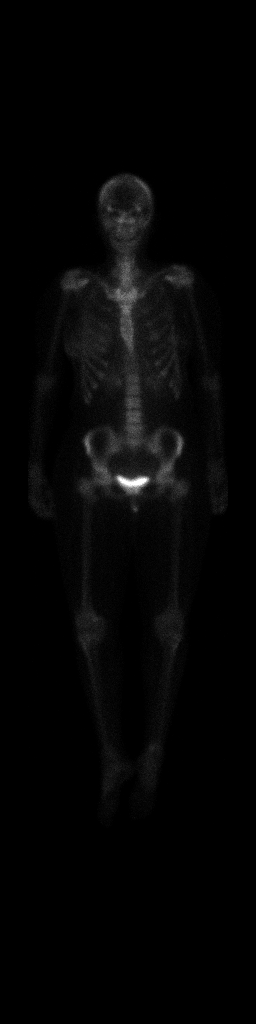

[2 of 2 positions shown; findings below may reference images not displayed]

FINDINGS: Anterior and posterior whole body planar images are obtained after
radiotracer administration. There is physiologic excretion of
radiotracer within the kidneys and bladder.

Low level radiotracer uptake within the soft tissues of the right
breast consistent with known infiltrating right breast cancer.

No focal radiotracer uptake within the bony structures to suggest an
acute or destructive process. Specifically, no evidence of bony
metastases.
IMPRESSION: 1. No evidence of bony metastatic disease.
2. Low level radiotracer uptake within the soft tissues of the right
breast corresponding to known infiltrative right breast cancer.

## 2021-07-13 MED ORDER — TECHNETIUM TC 99M MEDRONATE IV KIT
19.5000 | PACK | Freq: Once | INTRAVENOUS | Status: AC
  Administered 2021-07-13: 19.5 via INTRAVENOUS

## 2021-07-14 ENCOUNTER — Other Ambulatory Visit: Payer: Self-pay

## 2021-07-14 ENCOUNTER — Ambulatory Visit: Payer: BC Managed Care – PPO | Attending: Hematology and Oncology

## 2021-07-14 ENCOUNTER — Encounter: Payer: Self-pay | Admitting: *Deleted

## 2021-07-14 ENCOUNTER — Inpatient Hospital Stay: Payer: BC Managed Care – PPO

## 2021-07-14 VITALS — BP 106/89 | HR 109 | Temp 98.6°F | Resp 16

## 2021-07-14 DIAGNOSIS — R293 Abnormal posture: Secondary | ICD-10-CM

## 2021-07-14 DIAGNOSIS — C50411 Malignant neoplasm of upper-outer quadrant of right female breast: Secondary | ICD-10-CM

## 2021-07-14 DIAGNOSIS — Z170 Estrogen receptor positive status [ER+]: Secondary | ICD-10-CM

## 2021-07-14 MED ORDER — PEGFILGRASTIM-CBQV 6 MG/0.6ML ~~LOC~~ SOSY
6.0000 mg | PREFILLED_SYRINGE | Freq: Once | SUBCUTANEOUS | Status: AC
  Administered 2021-07-14: 6 mg via SUBCUTANEOUS
  Filled 2021-07-14: qty 0.6

## 2021-07-14 NOTE — Therapy (Signed)
[facility] @ [place], [place], [address] Phone: [phone]   Fax:  [phone]  Physical Therapy Evaluation  Patient Details  Name: Jenna Cross MRN: [id number] Date of Birth: May 26, 1986 Referring Provider (PT): Dr. Donne Hazel   Encounter Date: 07/14/2021   PT End of Session - 07/14/21 1004     Visit Number 1    Number of Visits 2    Date for PT Re-Evaluation 01/12/22    PT Start Time 0901    PT Stop Time 0955    PT Time Calculation (min) 54 min    Activity Tolerance Patient tolerated treatment well    Behavior During Therapy [facility] for tasks assessed/performed             Past Medical History:  Diagnosis Date   Anemia    Anxiety    Anxiety    no medication   Cancer [facility])     Past Surgical History:  Procedure Laterality Date   BREAST SURGERY     cyst removal from right breast    CESAREAN SECTION N/A 01/31/2021   Procedure: CESAREAN SECTION;  Surgeon: Dian Queen, MD;  Location: [facility] LD [facility]S;  Service: Obstetrics;  Laterality: N/A;   excision of fibroadenoma of breast     PORTACATH PLACEMENT N/A 07/11/2021   Procedure: INSERTION PORT-A-CATH;  Surgeon: Rolm Bookbinder, MD;  Location: [facility];  Service: General;  Laterality: N/A;   TONSILLECTOMY     and adenoids   TONSILLECTOMY AND ADENOIDECTOMY     WISDOM TOOTH EXTRACTION      There were no vitals filed for this visit.    Subjective Assessment - 07/14/21 0959     Subjective Pt is here for pre-surgical screen.    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  She is having neoadjuvant chemotherapay x 6 cycles which started on 07/12/2021. She is pending a bilateral mastectomy with SLNB with reconstruction, radiation and antiestrogen therapy.  She has 2 children: 5 mos and 18 mos. She does not yet have a surgery date    Patient Stated Goals gain information from providers     Currently in Pain? No/denies    Pain Score 0-No pain                [facility] PT Assessment - 07/14/21 0001       Assessment   Medical Diagnosis Right breast Cancer    Referring Provider (PT) Dr. Donne Hazel    Hand Dominance Right    Prior Therapy no      Precautions   Precaution Comments active CA      Restrictions   Weight Bearing Restrictions No      Balance Screen   Has the patient fallen in the past 6 months No    Has the patient had a decrease in activity level because of a fear of falling?  No    Is the patient reluctant to leave their home because of a fear of falling?  No      Home Environment   Living Environment Private residence    Living Arrangements Spouse/significant other;Children   2 children 18 mos and 5 mos   Available Help at Discharge Family    Type of [place] to enter    Home Layout Two level      Prior Function   Level of Independence Independent  Vocation Part time employment    Engineer, structural    Leisure walking, playing with kids      Cognition   Overall Cognitive Status Within Functional Limits for tasks assessed      Observation/Other Assessments   Observations Portacath on left with significant bruising noted at left chest    Skin Integrity --      Sit to Stand   Comments 9 reps in 30 sec      Posture/Postural Control   Posture/Postural Control Postural limitations    Postural Limitations Rounded Shoulders;Forward head      AROM   Right Shoulder Extension 62 Degrees    Right Shoulder Flexion 160 Degrees    Right Shoulder ABduction 180 Degrees    Right Shoulder Internal Rotation 70 Degrees    Right Shoulder External Rotation 107 Degrees    Left Shoulder Extension 65 Degrees    Left Shoulder Flexion 160 Degrees    Left Shoulder ABduction 180 Degrees    Left Shoulder Internal Rotation 74 Degrees    Left Shoulder External Rotation 108 Degrees               LYMPHEDEMA/ONCOLOGY  QUESTIONNAIRE - 07/14/21 0001       Type   Cancer Type Invasive Lobular Carcinoma (P)       Surgeries   Mastectomy Date -- (P)    pending     Treatment   Active Chemotherapy Treatment Yes (P)     Past Chemotherapy Treatment No (P)     Past Radiation Treatment No (P)     Past Hormone Therapy No (P)       What other symptoms do you have   Are you Having Heaviness or Tightness No (P)     Are you having Pain No (P)       Right Upper Extremity Lymphedema   Olecranon Process 23.4 cm (P)     10 cm Proximal to Ulnar Styloid Process 19.3 cm (P)     Across Hand at [employer] 14.2 cm (P)     At [place] of 2nd Digit 5.6 cm (P)       Left Upper Extremity Lymphedema   10 cm Proximal to Olecranon Process 26.4 cm (P)     Olecranon Process 23.2 cm (P)     10 cm Proximal to Ulnar Styloid Process 18.3 cm (P)     Just Proximal to Ulnar Styloid Process 14.3 cm (P)     At Base of 2nd Digit 5.3 cm (P)              L-DEX FLOWSHEETS - 07/14/21 0900       L-DEX LYMPHEDEMA SCREENING   Measurement Type Unilateral    L-DEX MEASUREMENT EXTREMITY Upper Extremity    POSITION  Standing    DOMINANT SIDE Right    At Risk Side Right    BASELINE SCORE (UNILATERAL) 0.9                  Quick Dash - 07/14/21 0001     Open a tight or new jar No difficulty    Do heavy household chores (wash walls, wash floors) No difficulty    Carry a shopping bag or briefcase No difficulty    Wash your back No difficulty    Use a knife to cut food No difficulty    Recreational activities in which you take some force or impact through your arm, shoulder, or hand (golf, hammering,  tennis) No difficulty    During the past week, to what extent has your arm, shoulder or hand problem interfered with your normal social activities with family, friends, neighbors, or groups? Not at all    During the past week, to what extent has your arm, shoulder or hand problem limited your work or other regular daily  activities Not at all    Arm, shoulder, or hand pain. None    Tingling (pins and needles) in your arm, shoulder, or hand None    Difficulty Sleeping No difficulty    DASH Score 0 %              Objective measurements completed on examination: See above findings.                PT Education - 07/14/21 1003     Education Details ABC class, Lymphedema, 4 post op exercises, SOZO screens    Person(s) Educated Patient    Methods Explanation;Demonstration;Handout    Comprehension Verbalized understanding;Returned demonstration                 PT Long Term Goals - 07/14/21 1118       PT LONG TERM GOAL #1   Title Pts post surgical bilateral shoulder  ROM will be within 5 degrees of pre surgical ROM    Time 6    Period Months    Status New    Target Date 01/12/22      PT LONG TERM GOAL #2   Title Pt will be set up for ABC class    Time 6    Period Months    Status New    Target Date 01/12/22             Breast Clinic Goals - 07/14/21 1010       Patient will be able to verbalize understanding of pertinent lymphedema risk reduction practices relevant to her diagnosis specifically related to skin care.   Time 1    Period Days    Status Achieved    Target Date 07/14/21      Patient will be able to return demonstrate and/or verbalize understanding of the post-op home exercise program related to regaining shoulder range of motion.   Time 1    Period Days    Status Achieved    Target Date 07/14/21      Patient will be able to verbalize understanding of the importance of attending the postoperative After Breast Cancer Class for further lymphedema risk reduction education and therapeutic exercise.   Time 1    Period Days    Status Achieved    Target Date 07/14/21                   Plan - 07/14/21 1006     Clinical Impression Statement Pt was here for pre-surgical screening.  She was educated in precautions for lymphedema, 4 post op  exercises to begin after drains removed and allowed by MD, ABC class.  SOZO screen was performed today and will be set up for every 3 months after her surgery for 2 years.  Pre-op shoulder ROM measurements and circumference measurements were taken.    Stability/Clinical Decision Making Stable/Uncomplicated    Clinical Decision Making Low    Rehab Potential Excellent    PT Frequency 1x / week    PT Duration Other (comment)   6 mos secondary to pt having neoadjuvant chemo   PT Treatment/Interventions Therapeutic exercise;Patient/family education  PT Next Visit Plan Reassess after surgery    PT Home Exercise Plan 4 post op exercises    Recommended Other Services schedule ABC, SOZO    Consulted and Agree with Plan of Care Patient            Patient will follow up at outpatient cancer rehab 3-4 weeks following surgery.  If the patient requires physical therapy at that time, a specific plan will be dictated and sent to the referring physician for approval. The patient was educated today on appropriate basic range of motion exercises to begin post operatively and the importance of attending the After Breast Cancer class following surgery.  Patient was educated today on lymphedema risk reduction practices as it pertains to recommendations that will benefit the patient immediately following surgery.  She verbalized good understanding.    Patient will benefit from skilled therapeutic intervention in order to improve the following deficits and impairments:  Decreased knowledge of precautions, Postural dysfunction The patient was assessed using the L-Dex machine today to produce a lymphedema index baseline score. The patient will be reassessed on a regular basis (typically every 3 months) to obtain new L-Dex scores. If the score is > 6.5 points away from his/her baseline score indicating onset of subclinical lymphedema, it will be recommended to wear a compression garment for 4 weeks, 12 hours per day and  then be reassessed. If the score continues to be > 6.5 points from baseline at reassessment, we will initiate lymphedema treatment. Assessing in this manner has a 95% rate of preventing clinically significant lymphedema. [place]  Visit Diagnosis: Malignant neoplasm of upper-outer quadrant of right breast in female, estrogen receptor positive ([place])  Abnormal posture     Problem List Patient Active Problem List   Diagnosis Date Noted   Malignant neoplasm of upper-outer quadrant of right breast in female, estrogen receptor positive ([place]) 06/27/2021   Breast tenderness in female 05/26/2021   Breast mass 05/26/2021   History of preterm premature rupture of membranes (PPROM) 01/31/2021   Preterm premature rupture of membranes (PPROM) with unknown onset of labor 01/27/2021   Vacuum extractor delivery, delivered 01/04/2020   Pregnancy 01/03/2020   Encounter for counseling 08/29/2018   Paresthesia 04/05/2016    Claris Pong, PT 07/14/2021, 11:21 AM  [facility] @ [place], [place], [address] Phone: [phone]   Fax:  [phone]  Name: Jenna Cross MRN: [id number] Date of Birth: July 21, 1986

## 2021-07-14 NOTE — Patient Instructions (Signed)
Physical Therapy Information for After Breast Cancer Surgery/Treatment:  Lymphedema is a swelling condition that you may be at risk for in your arm if you have lymph nodes removed from the armpit area.  After a sentinel node biopsy, the risk is approximately 5-9% and is higher after an axillary node dissection.  There is treatment available for this condition and it is not life-threatening.  Contact your physician or physical therapist with concerns. You may begin the 4 shoulder/posture exercises (see additional sheet) when permitted by your physician (typically a week after surgery).  If you have drains, you may need to wait until those are removed before beginning range of motion exercises.  A general recommendation is to not lift your arms above shoulder height until drains are removed.  These exercises should be done to your tolerance and gently.  This is not a "no pain/no gain" type of recovery so listen to your body and stretch into the range of motion that you can tolerate, stopping if you have pain.  If you are having immediate reconstruction, ask your plastic surgeon about doing exercises as he or she may want you to wait. We encourage you to attend the free one time ABC (After Breast Cancer) class offered by [facility].  You will learn information related to lymphedema risk, prevention and treatment and additional exercises to regain mobility following surgery.  You can call [phone] for more information.  This is offered the 1st and 3rd Monday of each month.  You only attend the class one time. While undergoing any medical procedure or treatment, try to avoid blood pressure being taken or needle sticks from occurring on the arm on the side of cancer.   This recommendation begins after surgery and continues for the rest of your life.  This may help reduce your risk of getting lymphedema (swelling in your arm). An excellent resource for those seeking information on  lymphedema is the National Lymphedema Network's web site. It can be accessed at [place].org If you notice swelling in your hand, arm or breast at any time following surgery (even if it is many years from now), please contact your doctor or physical therapist to discuss this.  Lymphedema can be treated at any time but it is easier for you if it is treated early on.  If you feel like your shoulder motion is not returning to normal in a reasonable amount of time, please contact your surgeon or physical therapist.  [facility]. [place], [place], [place] [phone]; [address] [place], [place] [address] ABC CLASS After Breast Cancer Class  After Breast Cancer Class is a specially designed exercise class to assist you in a safe recover after having breast cancer surgery.  In this class you will learn how to get back to full function whether your drains were just removed or if you had surgery a month ago.  This one-time class is held the 1st and 3rd Monday of every month from 11:00 a.m. until 12:00 noon virtually  This class is FREE and space is limited. For more information or to register for the next available class, call [phone].  Class Goals  Understand specific stretches to improve the flexibility of you chest and shoulder. Learn ways to safely strengthen your upper body and improve your posture. Understand the warning signs of infection and why you may be at risk for an arm infection. Learn about Lymphedema and prevention.  ** You do not attend this class until  after surgery.  Drains must be removed to participate  Patient was instructed today in a home exercise program today for post op shoulder range of motion. These included active assist shoulder flexion in sitting/supine, scapular retraction, wall walking with shoulder abduction, and hands behind head external rotation.  She was encouraged to do these twice a day, holding 3 seconds and repeating 5 times when permitted by her  physician.

## 2021-07-17 ENCOUNTER — Inpatient Hospital Stay: Payer: BC Managed Care – PPO

## 2021-07-17 ENCOUNTER — Encounter: Payer: Self-pay | Admitting: *Deleted

## 2021-07-17 ENCOUNTER — Other Ambulatory Visit: Payer: Self-pay

## 2021-07-17 ENCOUNTER — Encounter: Payer: Self-pay | Admitting: Genetic Counselor

## 2021-07-17 ENCOUNTER — Inpatient Hospital Stay (HOSPITAL_BASED_OUTPATIENT_CLINIC_OR_DEPARTMENT_OTHER): Payer: BC Managed Care – PPO | Admitting: Genetic Counselor

## 2021-07-17 ENCOUNTER — Other Ambulatory Visit: Payer: Self-pay | Admitting: Genetic Counselor

## 2021-07-17 DIAGNOSIS — Z170 Estrogen receptor positive status [ER+]: Secondary | ICD-10-CM

## 2021-07-17 DIAGNOSIS — C50411 Malignant neoplasm of upper-outer quadrant of right female breast: Secondary | ICD-10-CM

## 2021-07-17 DIAGNOSIS — Z8042 Family history of malignant neoplasm of prostate: Secondary | ICD-10-CM

## 2021-07-17 HISTORY — DX: Family history of malignant neoplasm of prostate: Z80.42

## 2021-07-17 LAB — CMP (CANCER CENTER ONLY)
ALT: 26 U/L (ref 0–44)
AST: 25 U/L (ref 15–41)
Albumin: 4.4 g/dL (ref 3.5–5.0)
Alkaline Phosphatase: 95 U/L (ref 38–126)
Anion gap: 8 (ref 5–15)
BUN: 10 mg/dL (ref 6–20)
CO2: 26 mmol/L (ref 22–32)
Calcium: 9.5 mg/dL (ref 8.9–10.3)
Chloride: 104 mmol/L (ref 98–111)
Creatinine: 0.81 mg/dL (ref 0.44–1.00)
GFR, Estimated: >60 mL/min (ref >60)
Glucose, Bld: 101 mg/dL — ABNORMAL HIGH (ref 70–99)
Potassium: 4.7 mmol/L (ref 3.5–5.1)
Sodium: 138 mmol/L (ref 135–145)
Total Bilirubin: 1.5 mg/dL — ABNORMAL HIGH (ref 0.3–1.2)
Total Protein: 7.5 g/dL (ref 6.5–8.1)

## 2021-07-17 LAB — CBC WITH DIFFERENTIAL (CANCER CENTER ONLY)
Abs Immature Granulocytes: 0.1 10*3/uL — ABNORMAL HIGH (ref 0.00–0.07)
Band Neutrophils: 7 %
Basophils Absolute: 0 10*3/uL (ref 0.0–0.1)
Basophils Relative: 0 %
Eosinophils Absolute: 0.2 10*3/uL (ref 0.0–0.5)
Eosinophils Relative: 4 %
HCT: 42.3 % (ref 36.0–46.0)
Hemoglobin: 14.5 g/dL (ref 12.0–15.0)
Lymphocytes Relative: 39 %
Lymphs Abs: 1.6 10*3/uL (ref 0.7–4.0)
MCH: 31.6 pg (ref 26.0–34.0)
MCHC: 34.3 g/dL (ref 30.0–36.0)
MCV: 92.2 fL (ref 80.0–100.0)
Metamyelocytes Relative: 2 %
Monocytes Absolute: 0 10*3/uL — ABNORMAL LOW (ref 0.1–1.0)
Monocytes Relative: 1 %
Myelocytes: 1 %
Neutro Abs: 2.1 10*3/uL (ref 1.7–7.7)
Neutrophils Relative %: 46 %
Platelet Count: 154 10*3/uL (ref 150–400)
RBC: 4.59 MIL/uL (ref 3.87–5.11)
RDW: 11.7 % (ref 11.5–15.5)
WBC Count: 4 10*3/uL (ref 4.0–10.5)
nRBC: 0 % (ref 0.0–0.2)

## 2021-07-17 LAB — GENETIC SCREENING ORDER

## 2021-07-17 NOTE — Progress Notes (Signed)
REFERRING PROVIDER: Nicholas Lose, MD [place],  [place] [address]  PRIMARY PROVIDER:  Dian Queen, MD  PRIMARY REASON FOR VISIT:  1. Malignant neoplasm of upper-outer quadrant of right breast in female, estrogen receptor positive (Jenna Cross)   2. Family history of prostate cancer     HISTORY OF PRESENT ILLNESS:   Jenna Cross, a 35 y.o. female, was seen for a [facility] cancer genetics consultation at the request of Dr. Lindi Cross due to a personal history of cancer.  Jenna Cross presents to clinic today to discuss the possibility of a hereditary predisposition to cancer, to discuss genetic testing, and to further clarify her future cancer risks, as well as potential cancer risks for family members.   In September 2022, at the age of 72, Jenna Cross was diagnosed with invasive lobular carcinoma of the right breast. The treatment plan include neoadjuvant chemotherapy.   CANCER HISTORY:  Oncology History  Malignant neoplasm of upper-outer quadrant of right breast in female, estrogen receptor positive ([name])  06/23/2021 Initial Diagnosis   Palpable lump in the right breast. Diagnostic mammogram and [place]: a 3.8 cm mass at the retroareolar right breast, 1 prominent right axillary lymph node with a 4 mm cortex, 1 other which is borderline measuring 3 mm. 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  3 o'clock position: Biopsy fibroadenoma   07/12/2021 -  Chemotherapy   Patient is on Treatment Plan : BREAST  Docetaxel + Carboplatin + Trastuzumab + Pertuzumab  (TCHP) q21d        RISK FACTORS:  Menarche was at age 9.  First live birth at age 67.  OCP use for approximately  16  years.  Ovaries intact: yes.  Hysterectomy: no.  Menopausal status: premenopausal.  HRT use: 0 years. Colonoscopy: no; not examined. Mammogram within the last year: yes. Up to date with pelvic exams: yes. Any excessive  radiation exposure in the past: no  Past Medical History:  Diagnosis Date   Anemia    Anxiety    Anxiety    no medication   Cancer ([place])    Family history of prostate cancer 07/17/2021    Past Surgical History:  Procedure Laterality Date   BREAST SURGERY     cyst removal from right breast    CESAREAN SECTION N/A 01/31/2021   Procedure: CESAREAN SECTION;  Surgeon: Jenna Queen, MD;  Location: [facility] LD ORS;  Service: Obstetrics;  Laterality: N/A;   excision of fibroadenoma of breast     PORTACATH PLACEMENT N/A 07/11/2021   Procedure: INSERTION PORT-A-CATH;  Surgeon: Jenna Bookbinder, MD;  Location: [facility];  Service: General;  Laterality: N/A;   TONSILLECTOMY     and adenoids   TONSILLECTOMY AND ADENOIDECTOMY     WISDOM TOOTH EXTRACTION      Social History   Socioeconomic History   Marital status: Married    Spouse name: Not on file   Number of children: 0   Years of education: College   Highest education level: Not on file  Occupational History   Not on file  Tobacco Use   Smoking status: Former   Smokeless tobacco: Never  Vaping Use   Vaping Use: Never used  Substance and Sexual Activity   Alcohol use: Not Currently   Drug use: Never   Sexual activity: Yes    Birth control/protection: None  Other Topics Concern   Not on file  Social History Narrative   ** Merged  History Encounter **       Lives at home Right handed Drinks 1 cup of coffee daily and a couple of sodas per month   Social Determinants of Health   Financial Resource Strain: Not on file  Food Insecurity: Not on file  Transportation Needs: Not on file  Physical Activity: Not on file  Stress: Not on file  Social Connections: Not on file     FAMILY HISTORY:  We obtained a detailed, 4-generation family history.  Significant diagnoses are listed below: Family History  Problem Relation Age of Onset   Lymphoma Paternal Aunt        dx late 49s   Prostate cancer Maternal  Grandfather        dx after 99    Jenna Cross is unaware of previous family history of genetic testing for hereditary cancer risks. There is no reported Ashkenazi Jewish ancestry. There is no known consanguinity.  GENETIC COUNSELING ASSESSMENT: Jenna Cross is a 35 y.o. female with a personal history of cancer which is somewhat suggestive of a hereditary cancer syndrome and predisposition to cancer given her age of diagnosis. We, therefore, discussed and recommended the following at today's visit.   DISCUSSION: We discussed that 5 - 10% of cancer is hereditary, with most cases of hereditary breast cancer associated with mutations in BRCA1/2.  There are other genes that can be associated with hereditary breast cancer syndromes.  Type of cancer risk and level of risk are gene-specific.  We discussed that testing is beneficial for several reasons including knowing how to follow individuals after completing their treatment, identifying whether potential treatment options would be beneficial, and understanding if other family members could be at risk for cancer and allowing them to undergo genetic testing.   We reviewed the characteristics, features and inheritance patterns of hereditary cancer syndromes. We also discussed genetic testing, including the appropriate family members to test, the process of testing, insurance coverage and turn-around-time for results. We discussed the implications of a negative, positive, carrier and/or variant of uncertain significant result. We recommended Jenna Cross pursue genetic testing for a panel that includes genes associated with breast cancer.   Jenna Cross  was offered a common hereditary cancer panel (47 genes) and an expanded pan-cancer panel (77 genes). Jenna Cross was informed of the benefits and limitations of each panel, including that expanded pan-cancer panels contain genes that do not have clear management guidelines at this point in time.  We also discussed  that as the number of genes included on a panel increases, the chances of variants of uncertain significance increases.  After considering the benefits and limitations of each gene panel, Jenna Cross  elected to have an expanded Radio broadcast assistant through [place].  The CancerNext-Expanded gene panel offered by [facility] and includes sequencing, rearrangement, and RNA analysis for the following 77 genes: AIP, ALK, APC, ATM, AXIN2, BAP1, BARD1, BLM, BMPR1A, BRCA1, BRCA2, BRIP1, CDC73, CDH1, CDK4, CDKN1B, CDKN2A, CHEK2, CTNNA1, DICER1, FANCC, FH, FLCN, GALNT12, KIF1B, LZTR1, MAX, MEN1, MET, MLH1, MSH2, MSH3, MSH6, MUTYH, NBN, NF1, NF2, NTHL1, PALB2, PHOX2B, PMS2, POT1, PRKAR1A, PTCH1, PTEN, RAD51C, RAD51D, RB1, RECQL, RET, SDHA, SDHAF2, SDHB, SDHC, SDHD, SMAD4, SMARCA4, SMARCB1, SMARCE1, STK11, SUFU, TMEM127, TP53, TSC1, TSC2, VHL and XRCC2 (sequencing and deletion/duplication); EGFR, EGLN1, HOXB13, KIT, MITF, PDGFRA, POLD1, and POLE (sequencing only); EPCAM and GREM1 (deletion/duplication only).    Based on Ms. Wiers's personal history of cancer, she meets medical criteria for genetic testing. Despite that she meets criteria,  she may still have an out of pocket cost. We discussed that if her out of pocket cost for testing is over $100, the laboratory should contact her to discuss self-pay options and patient pay assistance programs.   PLAN: After considering the risks, benefits, and limitations, Ms. Wien provided informed consent to pursue genetic testing and the blood sample was sent to [facility] for analysis of the CancerNext-Expanded +RNAinsight Panel. Results should be available within approximately 3 weeks' time, at which point they will be disclosed by telephone to Ms. Jaycox, as will any additional recommendations warranted by these results. Ms. Coultas will receive a summary of her genetic counseling visit and a copy of her results once available. This information will also be available  in Epic.   Lastly, we encouraged Ms. Shatzer to remain in contact with cancer genetics annually so that we can continuously update the family history and inform her of any changes in cancer genetics and testing that may be of benefit for this family.   Ms. Vejar questions were answered to her satisfaction today. Our contact information was provided should additional questions or concerns arise. Thank you for the referral and allowing [place] to share in the care of your patient.   Garett Tetzloff M. Joette Catching, [place], [facility] Genetic Counselor [email or web address] (P) [phone]  The patient was seen for a total of 25 minutes in face-to-face genetic counseling.  The patient was seen alone.  Drs. Magrinat, Jenna Cross and/or Burr Medico were available to discuss this case as needed.    _______________________________________________________________________ For Office Staff:  Number of people involved in session: 1 Was an Intern/ student involved with case: no

## 2021-07-18 NOTE — Progress Notes (Signed)
Patient Care Team: Dian Queen, MD as PCP - General (Obstetrics and Gynecology) Dian Queen, MD (Obstetrics and Gynecology) Mauro Kaufmann, RN as Oncology Nurse Navigator Rockwell Germany, RN as Oncology Nurse Navigator  DIAGNOSIS:    ICD-10-CM   1. Malignant neoplasm of upper-outer quadrant of right breast in female, estrogen receptor positive ([place])  C50.411    Z17.0       SUMMARY OF ONCOLOGIC HISTORY: Oncology History  Malignant neoplasm of upper-outer quadrant of right breast in female, estrogen receptor positive ([place])  06/23/2021 Initial Diagnosis   Palpable lump in the right breast. Diagnostic mammogram and [place]: a 3.8 cm mass at the retroareolar right breast, 1 prominent right axillary lymph node with a 4 mm cortex, 1 other which is borderline measuring 3 mm. 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  3 o'clock position: Biopsy fibroadenoma   07/12/2021 -  Chemotherapy   Patient is on Treatment Plan : BREAST  Docetaxel + Carboplatin + Trastuzumab + Pertuzumab  (TCHP) q21d        CHIEF COMPLIANT: Follow-up of right breast cancer  INTERVAL HISTORY: Jenna Cross is a 35 y.o. with above-mentioned history of right breast cancer. CT CAP on 07/10/2021 showed numerous enhancing masses seen throughout the right breast, right axillary lymphadenopathy, and no evidence of distant metastatic disease. She presents to the clinic today for follow-up.  She complains of mouth sores especially on the lips.  She also felt fatigued from day 4 today 7.  She felt nauseated for the last couple of days.  She had 1 or 2 episodes of diarrhea but not too severe.  Her major issue is fatigue which she thinks that it is getting better as of today.  ALLERGIES:  has No Known Allergies.  MEDICATIONS:  Current Outpatient Medications  Medication Sig Dispense Refill   magic mouthwash w/lidocaine SOLN Take 5 mLs by mouth 3  (three) times daily as needed for mouth pain. 100 mL 0   acetaminophen (TYLENOL) 500 MG tablet Take 1,000 mg by mouth every 6 (six) hours as needed for mild pain or headache.     cetirizine (ZYRTEC) 10 MG tablet Take 10 mg by mouth at bedtime. (Patient not taking: Reported on 07/14/2021)     dexamethasone (DECADRON) 4 MG tablet Take 1 tablet (4 mg total) by mouth daily. Take 1 tablet day before chemo and 1 tablet day after chemo with food 12 tablet 0   escitalopram (LEXAPRO) 20 MG tablet Take 20 mg by mouth daily.     ferrous sulfate 325 (65 FE) MG EC tablet Take 1 tablet (325 mg total) by mouth 2 (two) times daily. (Patient not taking: Reported on 07/14/2021) 60 tablet 2   ibuprofen (ADVIL) 600 MG tablet Take 1 tablet (600 mg total) by mouth every 6 (six) hours as needed. (Patient not taking: Reported on 07/14/2021) 30 tablet 0   lidocaine-prilocaine (EMLA) cream Apply to affected area once 30 g 3   loratadine (CLARITIN) 10 MG tablet Take 10 mg by mouth daily.     LORazepam (ATIVAN) 0.5 MG tablet Take 1 tablet (0.5 mg total) by mouth at bedtime as needed (Nausea or vomiting). 30 tablet 0   ondansetron (ZOFRAN) 8 MG tablet Take 1 tablet (8 mg total) by mouth 2 (two) times daily as needed (Nausea or vomiting). Start on the third day after chemotherapy. 30 tablet 1   prochlorperazine (COMPAZINE) 10 MG tablet Take 1 tablet (10 mg  total) by mouth every 6 (six) hours as needed (Nausea or vomiting). 30 tablet 1   traMADol (ULTRAM) 50 MG tablet Take 2 tablets (100 mg total) by mouth every 6 (six) hours as needed. (Patient not taking: Reported on 07/14/2021) 10 tablet 0   valACYclovir (VALTREX) 500 MG tablet Take 500 mg by mouth 2 (two) times daily.     No current facility-administered medications for this visit.    PHYSICAL EXAMINATION: ECOG PERFORMANCE STATUS: 1 - Symptomatic but completely ambulatory  Vitals:   07/19/21 1144  BP: 130/62  Pulse: 96  Resp: 19  Temp: 97.7 F (36.5 C)  SpO2: 99%    Filed Weights   07/19/21 1144  Weight: 140 lb 1.6 oz (63.5 kg)       LABORATORY DATA:  I have reviewed the data as listed CMP Latest Ref Rng & Units 07/19/2021 07/17/2021 07/06/2021  Glucose 70 - 99 mg/dL 87 101(H) 106(H)  BUN 6 - 20 mg/dL _0 Creatinine 0.44 - 1.00 mg/dL 0.70 0.81 0.84  Sodium 135 - 145 mmol/L 141 138 141  Potassium 3.5 - 5.1 mmol/L 4.1 4.7 4.1  Chloride 98 - 111 mmol/L 108 104 107  CO2 22 - 32 mmol/L _1 Calcium 8.9 - 10.3 mg/dL 9.7 9.5 9.2  Total Protein 6.5 - 8.1 g/dL 7.2 7.5 6.9  Total Bilirubin 0.3 - 1.2 mg/dL 0.8 1.5(H) 0.8  Alkaline Phos 38 - 126 U/L 77 95 83  AST 15 - 41 U/L _2 ALT 0 - 44 U/L _3 Lab Results  Component Value Date   WBC 6.4 07/19/2021   HGB 13.2 07/19/2021   HCT 37.5 07/19/2021   MCV 91.2 07/19/2021   PLT 158 07/19/2021   NEUTROABS 2.1 07/19/2021    ASSESSMENT & PLAN:  Malignant neoplasm of upper-outer quadrant of right breast in female, estrogen receptor positive ([facility]) Palpable lump in the right breast. Diagnostic mammogram and [place]: a 3.8 cm mass at the retroareolar right breast, 1 prominent right axillary lymph node with a 4 mm cortex, 1 other which is borderline measuring 3 mm. 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  3 o'clock position: Biopsy fibroadenoma   Treatment plan: 1. Neoadjuvant chemotherapy with TCH Perjeta 6 cycles followed by Herceptin Perjeta maintenance versus Kadcyla maintenance (based on response to neoadjuvant chemo) for 1 year 2. Followed by bilateral mastectomies with sentinel lymph node study 3. Followed by adjuvant radiation therapy  4.  Followed by antiestrogen therapy 5.  Followed by neratinib   Breast MRI 06/28/2021: Confluent mass right breast 11.1 x 7.8 x 9.5 cm, 1 enlarged lymph node and second borderline lymph node in the right axilla Brain MRI 07/03/2021: No evidence of malignancy. CT CAP and  bone scan: 07/10/2021: Numerous enhancing right breast masses and right axillary lymph nodes, nonspecific small pulmonary nodules, bone scan: No bone metastases URCC nausea study, ------------------------------------------------------------------------------------------------------------------------------------ Current treatment: Cycle 1 day 8 TCHP to start 07/12/2021 (Dignicap) Echocardiogram 07/07/2021: EF 55 to 60% Chemo toxicities: Sores on the lips instructed her to use Orajel Severe fatigue: As expected Mild nausea Occasional diarrhea Labs do not show any evidence of neutropenia. Return to clinic in 2 weeks for cycle 2    No orders of the defined types were placed in this encounter.  The patient has a good understanding of the overall plan. she agrees with it. she will call with any problems that  may develop before the next visit here.  Total time spent: 30 mins including face to face time and time spent for planning, charting and coordination of care  Rulon Eisenmenger, MD, MPH 07/19/2021  I, Thana Ates, am acting as scribe for Dr. Nicholas Lose.  I have reviewed the above documentation for accuracy and completeness, and I agree with the above.

## 2021-07-19 ENCOUNTER — Inpatient Hospital Stay: Payer: BC Managed Care – PPO

## 2021-07-19 ENCOUNTER — Inpatient Hospital Stay (HOSPITAL_BASED_OUTPATIENT_CLINIC_OR_DEPARTMENT_OTHER): Payer: BC Managed Care – PPO | Admitting: Hematology and Oncology

## 2021-07-19 ENCOUNTER — Encounter: Payer: Self-pay | Admitting: Hematology and Oncology

## 2021-07-19 ENCOUNTER — Encounter: Payer: Self-pay | Admitting: *Deleted

## 2021-07-19 ENCOUNTER — Other Ambulatory Visit: Payer: Self-pay

## 2021-07-19 DIAGNOSIS — Z170 Estrogen receptor positive status [ER+]: Secondary | ICD-10-CM | POA: Diagnosis not present

## 2021-07-19 DIAGNOSIS — C50411 Malignant neoplasm of upper-outer quadrant of right female breast: Secondary | ICD-10-CM

## 2021-07-19 DIAGNOSIS — Z95828 Presence of other vascular implants and grafts: Secondary | ICD-10-CM

## 2021-07-19 LAB — CBC WITH DIFFERENTIAL/PLATELET
Abs Immature Granulocytes: 0.69 10*3/uL — ABNORMAL HIGH (ref 0.00–0.07)
Basophils Absolute: 0.1 10*3/uL (ref 0.0–0.1)
Basophils Relative: 1 %
Eosinophils Absolute: 0.1 10*3/uL (ref 0.0–0.5)
Eosinophils Relative: 1 %
HCT: 37.5 % (ref 36.0–46.0)
Hemoglobin: 13.2 g/dL (ref 12.0–15.0)
Immature Granulocytes: 11 %
Lymphocytes Relative: 23 %
Lymphs Abs: 1.5 10*3/uL (ref 0.7–4.0)
MCH: 32.1 pg (ref 26.0–34.0)
MCHC: 35.2 g/dL (ref 30.0–36.0)
MCV: 91.2 fL (ref 80.0–100.0)
Monocytes Absolute: 1.9 10*3/uL — ABNORMAL HIGH (ref 0.1–1.0)
Monocytes Relative: 30 %
Neutro Abs: 2.1 10*3/uL (ref 1.7–7.7)
Neutrophils Relative %: 34 %
Platelets: 158 10*3/uL (ref 150–400)
RBC: 4.11 MIL/uL (ref 3.87–5.11)
RDW: 11.6 % (ref 11.5–15.5)
Smear Review: NORMAL
WBC: 6.4 10*3/uL (ref 4.0–10.5)
nRBC: 0.3 % — ABNORMAL HIGH (ref 0.0–0.2)

## 2021-07-19 LAB — CMP (CANCER CENTER ONLY)
ALT: 28 U/L (ref 0–44)
AST: 27 U/L (ref 15–41)
Albumin: 4.1 g/dL (ref 3.5–5.0)
Alkaline Phosphatase: 77 U/L (ref 38–126)
Anion gap: 7 (ref 5–15)
BUN: 10 mg/dL (ref 6–20)
CO2: 26 mmol/L (ref 22–32)
Calcium: 9.7 mg/dL (ref 8.9–10.3)
Chloride: 108 mmol/L (ref 98–111)
Creatinine: 0.7 mg/dL (ref 0.44–1.00)
GFR, Estimated: >60 mL/min (ref >60)
Glucose, Bld: 87 mg/dL (ref 70–99)
Potassium: 4.1 mmol/L (ref 3.5–5.1)
Sodium: 141 mmol/L (ref 135–145)
Total Bilirubin: 0.8 mg/dL (ref 0.3–1.2)
Total Protein: 7.2 g/dL (ref 6.5–8.1)

## 2021-07-19 MED ORDER — HEPARIN SOD (PORK) LOCK FLUSH 100 UNIT/ML IV SOLN
500.0000 [IU] | Freq: Once | INTRAVENOUS | Status: AC
  Administered 2021-07-19: 500 [IU] via INTRAVENOUS

## 2021-07-19 MED ORDER — SODIUM CHLORIDE 0.9% FLUSH
10.0000 mL | INTRAVENOUS | Status: DC | PRN
  Administered 2021-07-19: 10 mL via INTRAVENOUS

## 2021-07-19 MED ORDER — MAGIC MOUTHWASH W/LIDOCAINE: 5.0000 mL | Freq: Three times a day (TID) | ORAL | 0 refills | Status: DC | PRN

## 2021-07-19 NOTE — Assessment & Plan Note (Addendum)
Palpable lump in the right breast. Diagnostic mammogram and US: a 3.8 cm mass at the retroareolar right breast, 1 prominent right axillary lymph node with a 4 mm cortex, 1 other which is borderline measuring 3 mm. 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 3 o'clock position: Biopsy fibroadenoma  Treatment plan: 1. Neoadjuvant chemotherapy with TCH Perjeta 6 cycles followed by Herceptin Perjeta maintenance versus Kadcyla maintenance (based on response to neoadjuvant chemo) for 1 year 2. Followed by bilateral mastectomies with sentinel lymph node study 3. Followed by adjuvant radiation therapy  4.  Followed by antiestrogen therapy 5.  Followed by neratinib  Breast MRI 06/28/2021: Confluent mass right breast 11.1 x 7.8 x 9.5 cm, 1 enlarged lymph node and second borderline lymph node in the right axilla Brain MRI 07/03/2021: No evidence of malignancy. CT CAP and bone scan: 07/10/2021: Numerous enhancing right breast masses and right axillary lymph nodes, nonspecific small pulmonary nodules, bone scan: No bone metastases URCC nausea study, ------------------------------------------------------------------------------------------------------------------------------------ Current treatment: Cycle 1 day 8 TCHP to start 07/12/2021 (Dignicap) Echocardiogram 07/07/2021: EF 55 to 60% Chemo toxicities:  Return to clinic in 2 weeks for cycle 2 

## 2021-07-21 DIAGNOSIS — C50411 Malignant neoplasm of upper-outer quadrant of right female breast: Secondary | ICD-10-CM | POA: Diagnosis present

## 2021-07-21 DIAGNOSIS — Z170 Estrogen receptor positive status [ER+]: Secondary | ICD-10-CM | POA: Diagnosis present

## 2021-07-21 DIAGNOSIS — R293 Abnormal posture: Secondary | ICD-10-CM | POA: Diagnosis present

## 2021-07-26 ENCOUNTER — Encounter: Payer: Self-pay | Admitting: Hematology and Oncology

## 2021-07-26 ENCOUNTER — Ambulatory Visit: Payer: BC Managed Care – PPO

## 2021-07-27 ENCOUNTER — Encounter: Payer: Self-pay | Admitting: Hematology and Oncology

## 2021-07-27 ENCOUNTER — Telehealth: Payer: Self-pay | Admitting: *Deleted

## 2021-07-27 ENCOUNTER — Other Ambulatory Visit: Payer: Self-pay | Admitting: *Deleted

## 2021-07-27 MED ORDER — CLINDAMYCIN PHOSPHATE 1 % EX GEL: Freq: Two times a day (BID) | CUTANEOUS | 3 refills | Status: DC

## 2021-07-27 NOTE — Telephone Encounter (Signed)
Pt called with worsening acne to face and back. Per Dr. Lindi Adie topical clindamycin cent to pharmacy. Pt notified and directions provided.

## 2021-08-01 ENCOUNTER — Encounter: Payer: Self-pay | Admitting: Genetic Counselor

## 2021-08-01 ENCOUNTER — Telehealth: Payer: Self-pay | Admitting: Genetic Counselor

## 2021-08-01 ENCOUNTER — Ambulatory Visit: Payer: Self-pay | Admitting: Genetic Counselor

## 2021-08-01 DIAGNOSIS — Z1379 Encounter for other screening for genetic and chromosomal anomalies: Secondary | ICD-10-CM

## 2021-08-01 DIAGNOSIS — Z170 Estrogen receptor positive status [ER+]: Secondary | ICD-10-CM

## 2021-08-01 DIAGNOSIS — C50411 Malignant neoplasm of upper-outer quadrant of right female breast: Secondary | ICD-10-CM

## 2021-08-01 DIAGNOSIS — Z8042 Family history of malignant neoplasm of prostate: Secondary | ICD-10-CM

## 2021-08-01 MED FILL — Dexamethasone Sodium Phosphate Inj 100 MG/10ML: INTRAMUSCULAR | Qty: 1 | Status: AC

## 2021-08-01 MED FILL — Fosaprepitant Dimeglumine For IV Infusion 150 MG (Base Eq): INTRAVENOUS | Qty: 5 | Status: AC

## 2021-08-01 NOTE — Telephone Encounter (Signed)
Revealed negative genetic testing and variant of uncertain significance in RET.  Discussed that we do not know why she has breast cancer or why there is cancer in the family. It could be sporadic/familial, due to a different gene that we are not testing, or maybe our current technology may not be able to pick something up.  It will be important for her to keep in contact with genetics to keep up with whether additional testing may be needed.

## 2021-08-01 NOTE — Progress Notes (Signed)
HPI:   Ms. Gilles was previously seen in the [facility] due to a personal history of cancer and concerns regarding a hereditary predisposition to cancer. Please refer to our prior cancer genetics clinic note for more information regarding our discussion, assessment and recommendations, at the time. Ms. Lindenberger recent genetic test results were disclosed to her, as were recommendations warranted by these results. These results and recommendations are discussed in more detail below.  CANCER HISTORY:  Oncology History  Malignant neoplasm of upper-outer quadrant of right breast in female, estrogen receptor positive ([place])  06/23/2021 Initial Diagnosis   Palpable lump in the right breast. Diagnostic mammogram and [place]: a 3.8 cm mass at the retroareolar right breast, 1 prominent right axillary lymph node with a 4 mm cortex, 1 other which is borderline measuring 3 mm. 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  3 o'clock position: Biopsy fibroadenoma   07/12/2021 -  Chemotherapy   Patient is on Treatment Plan : BREAST  Docetaxel + Carboplatin + Trastuzumab + Pertuzumab  (TCHP) q21d      07/28/2021 Genetic Testing   Negative hereditary cancer genetic testing: no pathogenic variants detected in [facility] CancerNext-Expanded +RNAinsight Panel.  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  The report date is July 28, 2021.   The CancerNext-Expanded gene panel offered by [facility] and includes sequencing, rearrangement, and RNA analysis for the following 77 genes: AIP, ALK, APC, ATM, AXIN2, BAP1, BARD1, BLM, BMPR1A, BRCA1, BRCA2, BRIP1, CDC73, CDH1, CDK4, CDKN1B, CDKN2A, CHEK2, CTNNA1, DICER1, FANCC, FH, FLCN, GALNT12, KIF1B, LZTR1, MAX, MEN1, MET, MLH1, MSH2, MSH3, MSH6, MUTYH, NBN, NF1, NF2, NTHL1, PALB2, PHOX2B, PMS2, POT1, PRKAR1A, PTCH1, PTEN, RAD51C, RAD51D, RB1, RECQL, RET,  SDHA, SDHAF2, SDHB, SDHC, SDHD, SMAD4, SMARCA4, SMARCB1, SMARCE1, STK11, SUFU, TMEM127, TP53, TSC1, TSC2, VHL and XRCC2 (sequencing and deletion/duplication); EGFR, EGLN1, HOXB13, KIT, MITF, PDGFRA, POLD1, and POLE (sequencing only); EPCAM and GREM1 (deletion/duplication only).      FAMILY HISTORY:  We obtained a detailed, 4-generation family history.  Significant diagnoses are listed below: Family History  Problem Relation Age of Onset   Lymphoma Paternal Aunt        dx late 56s   Prostate cancer Maternal Grandfather        dx after 62   Ms. Lykins is unaware of previous family history of genetic testing for hereditary cancer risks. There is no reported Ashkenazi Jewish ancestry. There is no known consanguinity.    GENETIC TEST RESULTS:  The [facility] CancerNext-Expanded +RNAinsight Panel found no pathogenic mutations. The CancerNext-Expanded gene panel offered by [facility] and includes sequencing, rearrangement, and RNA analysis for the following 77 genes: AIP, ALK, APC, ATM, AXIN2, BAP1, BARD1, BLM, BMPR1A, BRCA1, BRCA2, BRIP1, CDC73, CDH1, CDK4, CDKN1B, CDKN2A, CHEK2, CTNNA1, DICER1, FANCC, FH, FLCN, GALNT12, KIF1B, LZTR1, MAX, MEN1, MET, MLH1, MSH2, MSH3, MSH6, MUTYH, NBN, NF1, NF2, NTHL1, PALB2, PHOX2B, PMS2, POT1, PRKAR1A, PTCH1, PTEN, RAD51C, RAD51D, RB1, RECQL, RET, SDHA, SDHAF2, SDHB, SDHC, SDHD, SMAD4, SMARCA4, SMARCB1, SMARCE1, STK11, SUFU, TMEM127, TP53, TSC1, TSC2, VHL and XRCC2 (sequencing and deletion/duplication); EGFR, EGLN1, HOXB13, KIT, MITF, PDGFRA, POLD1, and POLE (sequencing only); EPCAM and GREM1 (deletion/duplication only).  .   The test report has been scanned into EPIC and is located under the Molecular Pathology section of the Results Review tab.  A portion of the result report is included below for reference. Genetic testing reported out on July 28, 2021.  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  At this time, it is unknown if this variant is associated with an increased risk for cancer or if it is benign, but most uncertain variants are reclassified to benign. It should not be used to make medical management decisions. With time, we suspect the laboratory will determine the significance of this variant, if any. If the laboratory reclassifies this variant, we will attempt to contact Ms. Romey to discuss it further.   Even though a pathogenic variant was not identified, possible explanations for the cancer in the family may include: There may be no hereditary risk for cancer in the family. The cancers in Ms. Stocking and/or her family may be sporadic/familial or due to other genetic and environmental factors. There may be a gene mutation in one of these genes that current testing methods cannot detect but that chance is small. There could be another gene that has not yet been discovered, or that we have not yet tested, that is responsible for the cancer diagnoses in the family.   Therefore, it is important to remain in touch with cancer genetics in the future so that we can continue to offer Ms. Dorrough the most up to date genetic testing.   ADDITIONAL GENETIC TESTING:  We discussed with Ms. Lofton that her genetic testing was fairly extensive.  If there are genes identified to increase cancer risk that can be analyzed in the future, we would be happy to discuss and coordinate this testing at that time.    CANCER SCREENING RECOMMENDATIONS:  Ms. Riccardi test result is considered negative (normal).  This means that we have not identified a hereditary cause for her personal history of cancer at this time.   An individual's cancer risk and medical management are not determined by genetic test results alone. Overall cancer risk assessment incorporates additional factors, including personal medical history, family history, and any available genetic information that may  result in a personalized plan for cancer prevention and surveillance. Therefore, it is recommended she continue to follow the cancer management and screening guidelines provided by her oncology and primary healthcare provider.  RECOMMENDATIONS FOR FAMILY MEMBERS:   Since she did not inherit a identifiable mutation in a cancer predisposition gene included on this panel, her children could not have inherited a known mutation from her in one of these genes. Individuals in this family might be at some increased risk of developing cancer, over the general population risk, due to the family history of cancer.  Individuals in the family should notify their providers of the family history of cancer. We recommend women in this family have a yearly mammogram beginning at age 77, or 21 years younger than the earliest onset of cancer, an annual clinical breast exam, and perform monthly breast self-exams.  Family members should have colonoscopies by at age 73, or earlier, as recommended by their providers.  We do not recommend familial testing for the RET variant of uncertain significance (VUS).  FOLLOW-UP:  Lastly, we discussed with Ms. Carll that cancer genetics is a rapidly advancing field and it is possible that new genetic tests will be appropriate for her and/or her family members in the future. We encouraged her to remain in contact with cancer genetics on an annual basis so we can update her personal and family histories and let her know of advances in cancer genetics that may benefit this family.   Our contact  number was provided. Ms. Minium questions were answered to her satisfaction, and she knows she is welcome to call us at anytime with additional questions or concerns.   Lyann Hagstrom M. Joette Catching, [place], [facility] Genetic Counselor [email or web address] (P) [phone]

## 2021-08-01 NOTE — Progress Notes (Signed)
Patient Care Team: Dian Queen, MD as PCP - General (Obstetrics and Gynecology) Dian Queen, MD (Obstetrics and Gynecology) Mauro Kaufmann, RN as Oncology Nurse Navigator Rockwell Germany, RN as Oncology Nurse Navigator  DIAGNOSIS:    ICD-10-CM   1. Malignant neoplasm of upper-outer quadrant of right breast in female, estrogen receptor positive ([place])  C50.411    Z17.0       SUMMARY OF ONCOLOGIC HISTORY: Oncology History  Malignant neoplasm of upper-outer quadrant of right breast in female, estrogen receptor positive ([place])  06/23/2021 Initial Diagnosis   Palpable lump in the right breast. Diagnostic mammogram and [place]: a 3.8 cm mass at the retroareolar right breast, 1 prominent right axillary lymph node with a 4 mm cortex, 1 other which is borderline measuring 3 mm. 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  3 o'clock position: Biopsy fibroadenoma   07/12/2021 -  Chemotherapy   Patient is on Treatment Plan : BREAST  Docetaxel + Carboplatin + Trastuzumab + Pertuzumab  (TCHP) q21d      07/28/2021 Genetic Testing   Negative hereditary cancer genetic testing: no pathogenic variants detected in [facility] CancerNext-Expanded +RNAinsight Panel.  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  The report date is July 28, 2021.   The CancerNext-Expanded gene panel offered by [facility] and includes sequencing, rearrangement, and RNA analysis for the following 77 genes: AIP, ALK, APC, ATM, AXIN2, BAP1, BARD1, BLM, BMPR1A, BRCA1, BRCA2, BRIP1, CDC73, CDH1, CDK4, CDKN1B, CDKN2A, CHEK2, CTNNA1, DICER1, FANCC, FH, FLCN, GALNT12, KIF1B, LZTR1, MAX, MEN1, MET, MLH1, MSH2, MSH3, MSH6, MUTYH, NBN, NF1, NF2, NTHL1, PALB2, PHOX2B, PMS2, POT1, PRKAR1A, PTCH1, PTEN, RAD51C, RAD51D, RB1, RECQL, RET, SDHA, SDHAF2, SDHB, SDHC, SDHD, SMAD4, SMARCA4, SMARCB1, SMARCE1, STK11, SUFU, TMEM127, TP53, TSC1,  TSC2, VHL and XRCC2 (sequencing and deletion/duplication); EGFR, EGLN1, HOXB13, KIT, MITF, PDGFRA, POLD1, and POLE (sequencing only); EPCAM and GREM1 (deletion/duplication only).      CHIEF COMPLIANT: Cycle 2 TCHP  INTERVAL HISTORY: Jenna Cross is a 35 y.o. with above-mentioned history of right breast cancer.  She presents today for cycle 2 of TCHP.  She has tolerated cycle 1 reasonably well.  She had mouth sores and diarrhea and fatigue is the main side effects.  She was able to recover from the mouth sores.  The diarrhea was not getting well controlled with Imodium.  Denied any nausea or vomiting.  She stays very busy with her children and therefore did not feel any emotional distress.  Her parents as well as her in-laws were all diagnosed with COVID along with her children.  This was a slightly stressful recently.  ALLERGIES:  has No Known Allergies.  MEDICATIONS:  Current Outpatient Medications  Medication Sig Dispense Refill   acetaminophen (TYLENOL) 500 MG tablet Take 1,000 mg by mouth every 6 (six) hours as needed for mild pain or headache.     cetirizine (ZYRTEC) 10 MG tablet Take 10 mg by mouth at bedtime. (Patient not taking: Reported on 07/14/2021)     clindamycin (CLINDAGEL) 1 % gel Apply topically 2 (two) times daily. 30 g 3   dexamethasone (DECADRON) 4 MG tablet Take 1 tablet (4 mg total) by mouth daily. Take 1 tablet day before chemo and 1 tablet day after chemo with food 12 tablet 0   escitalopram (LEXAPRO) 20 MG tablet Take 20 mg by mouth daily.     ferrous sulfate 325 (65 FE) MG EC tablet Take 1  tablet (325 mg total) by mouth 2 (two) times daily. (Patient not taking: Reported on 07/14/2021) 60 tablet 2   ibuprofen (ADVIL) 600 MG tablet Take 1 tablet (600 mg total) by mouth every 6 (six) hours as needed. (Patient not taking: Reported on 07/14/2021) 30 tablet 0   lidocaine-prilocaine (EMLA) cream Apply to affected area once 30 g 3   loratadine (CLARITIN) 10 MG tablet Take 10  mg by mouth daily.     LORazepam (ATIVAN) 0.5 MG tablet Take 1 tablet (0.5 mg total) by mouth at bedtime as needed (Nausea or vomiting). 30 tablet 0   magic mouthwash w/lidocaine SOLN Take 5 mLs by mouth 3 (three) times daily as needed for mouth pain. 100 mL 0   ondansetron (ZOFRAN) 8 MG tablet Take 1 tablet (8 mg total) by mouth 2 (two) times daily as needed (Nausea or vomiting). Start on the third day after chemotherapy. 30 tablet 1   prochlorperazine (COMPAZINE) 10 MG tablet Take 1 tablet (10 mg total) by mouth every 6 (six) hours as needed (Nausea or vomiting). 30 tablet 1   traMADol (ULTRAM) 50 MG tablet Take 2 tablets (100 mg total) by mouth every 6 (six) hours as needed. (Patient not taking: Reported on 07/14/2021) 10 tablet 0   valACYclovir (VALTREX) 500 MG tablet Take 500 mg by mouth 2 (two) times daily.     No current facility-administered medications for this visit.    PHYSICAL EXAMINATION: ECOG PERFORMANCE STATUS: 1 - Symptomatic but completely ambulatory  Vitals:   08/02/21 0844  BP: 117/70  Pulse: (!) 109  Resp: 17  Temp: 97.9 F (36.6 C)  SpO2: 99%   Filed Weights   08/02/21 0844  Weight: 140 lb 9 oz (63.8 kg)       LABORATORY DATA:  I have reviewed the data as listed CMP Latest Ref Rng & Units 07/19/2021 07/17/2021 07/06/2021  Glucose 70 - 99 mg/dL 87 101(H) 106(H)  BUN 6 - 20 mg/dL _0 Creatinine 0.44 - 1.00 mg/dL 0.70 0.81 0.84  Sodium 135 - 145 mmol/L 141 138 141  Potassium 3.5 - 5.1 mmol/L 4.1 4.7 4.1  Chloride 98 - 111 mmol/L 108 104 107  CO2 22 - 32 mmol/L _1 Calcium 8.9 - 10.3 mg/dL 9.7 9.5 9.2  Total Protein 6.5 - 8.1 g/dL 7.2 7.5 6.9  Total Bilirubin 0.3 - 1.2 mg/dL 0.8 1.5(H) 0.8  Alkaline Phos 38 - 126 U/L 77 95 83  AST 15 - 41 U/L _2 ALT 0 - 44 U/L _3 Lab Results  Component Value Date   WBC 6.3 08/02/2021   HGB 12.1 08/02/2021   HCT 34.9 (L) 08/02/2021   MCV 92.6 08/02/2021   PLT 392 08/02/2021   NEUTROABS 3.7  08/02/2021    ASSESSMENT & PLAN:  Malignant neoplasm of upper-outer quadrant of right breast in female, estrogen receptor positive ([facility]) Palpable lump in the right breast. Diagnostic mammogram and [place]: a 3.8 cm mass at the retroareolar right breast, 1 prominent right axillary lymph node with a 4 mm cortex, 1 other which is borderline measuring 3 mm. 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  3 o'clock position: Biopsy fibroadenoma   Treatment plan: 1. Neoadjuvant chemotherapy with TCH Perjeta 6 cycles followed by Herceptin Perjeta maintenance versus Kadcyla maintenance (based on response to neoadjuvant chemo) for 1 year 2. Followed by bilateral mastectomies with sentinel lymph  node study 3. Followed by adjuvant radiation therapy  4.  Followed by antiestrogen therapy 5.  Followed by neratinib   Breast MRI 06/28/2021: Confluent mass right breast 11.1 x 7.8 x 9.5 cm, 1 enlarged lymph node and second borderline lymph node in the right axilla Brain MRI 07/03/2021: No evidence of malignancy. CT CAP and bone scan: 07/10/2021: Numerous enhancing right breast masses and right axillary lymph nodes, nonspecific small pulmonary nodules, bone scan: No bone metastases URCC nausea study, ------------------------------------------------------------------------------------------------------------------------------------ Current treatment: Cycle 2 TCHP to start 07/12/2021 (Dignicap) Echocardiogram 07/07/2021: EF 55 to 60% Chemo toxicities: Sores on the lips: Resolved Severe fatigue: As expected Mild nausea Occasional diarrhea: Not well controlled with Imodium we will send for Lomotil.  5.  Tachycardia: Could be related to Taxotere versus dehydration we will wait for the renal function to come back. Return to clinic in 3 weeks for cycle 3    No orders of the defined types were placed in this encounter.  The patient has a good  understanding of the overall plan. she agrees with it. she will call with any problems that may develop before the next visit here.  Total time spent: 30 mins including face to face time and time spent for planning, charting and coordination of care  Rulon Eisenmenger, MD, MPH 08/02/2021  I, Thana Ates, am acting as scribe for Dr. Nicholas Lose.  I have reviewed the above documentation for accuracy and completeness, and I agree with the above.

## 2021-08-02 ENCOUNTER — Inpatient Hospital Stay: Payer: BC Managed Care – PPO

## 2021-08-02 ENCOUNTER — Other Ambulatory Visit: Payer: Self-pay

## 2021-08-02 ENCOUNTER — Inpatient Hospital Stay: Payer: BC Managed Care – PPO | Admitting: Hematology and Oncology

## 2021-08-02 VITALS — HR 93

## 2021-08-02 DIAGNOSIS — Z95828 Presence of other vascular implants and grafts: Secondary | ICD-10-CM

## 2021-08-02 DIAGNOSIS — Z170 Estrogen receptor positive status [ER+]: Secondary | ICD-10-CM | POA: Diagnosis not present

## 2021-08-02 DIAGNOSIS — C50411 Malignant neoplasm of upper-outer quadrant of right female breast: Secondary | ICD-10-CM | POA: Diagnosis not present

## 2021-08-02 LAB — CBC WITH DIFFERENTIAL/PLATELET
Abs Immature Granulocytes: 0.03 10*3/uL (ref 0.00–0.07)
Basophils Absolute: 0 10*3/uL (ref 0.0–0.1)
Basophils Relative: 1 %
Eosinophils Absolute: 0.2 10*3/uL (ref 0.0–0.5)
Eosinophils Relative: 2 %
HCT: 34.9 % — ABNORMAL LOW (ref 36.0–46.0)
Hemoglobin: 12.1 g/dL (ref 12.0–15.0)
Immature Granulocytes: 1 %
Lymphocytes Relative: 30 %
Lymphs Abs: 1.9 10*3/uL (ref 0.7–4.0)
MCH: 32.1 pg (ref 26.0–34.0)
MCHC: 34.7 g/dL (ref 30.0–36.0)
MCV: 92.6 fL (ref 80.0–100.0)
Monocytes Absolute: 0.5 10*3/uL (ref 0.1–1.0)
Monocytes Relative: 8 %
Neutro Abs: 3.7 10*3/uL (ref 1.7–7.7)
Neutrophils Relative %: 58 %
Platelets: 392 10*3/uL (ref 150–400)
RBC: 3.77 MIL/uL — ABNORMAL LOW (ref 3.87–5.11)
RDW: 13 % (ref 11.5–15.5)
WBC: 6.3 10*3/uL (ref 4.0–10.5)
nRBC: 0 % (ref 0.0–0.2)

## 2021-08-02 LAB — COMPREHENSIVE METABOLIC PANEL
ALT: 12 U/L (ref 0–44)
AST: 15 U/L (ref 15–41)
Albumin: 3.8 g/dL (ref 3.5–5.0)
Alkaline Phosphatase: 92 U/L (ref 38–126)
Anion gap: 11 (ref 5–15)
BUN: 14 mg/dL (ref 6–20)
CO2: 20 mmol/L — ABNORMAL LOW (ref 22–32)
Calcium: 9.1 mg/dL (ref 8.9–10.3)
Chloride: 108 mmol/L (ref 98–111)
Creatinine, Ser: 0.72 mg/dL (ref 0.44–1.00)
GFR, Estimated: >60 mL/min (ref >60)
Glucose, Bld: 102 mg/dL — ABNORMAL HIGH (ref 70–99)
Potassium: 3.8 mmol/L (ref 3.5–5.1)
Sodium: 139 mmol/L (ref 135–145)
Total Bilirubin: 0.5 mg/dL (ref 0.3–1.2)
Total Protein: 7.4 g/dL (ref 6.5–8.1)

## 2021-08-02 LAB — PREGNANCY, URINE: Preg Test, Ur: NEGATIVE

## 2021-08-02 MED ORDER — HEPARIN SOD (PORK) LOCK FLUSH 100 UNIT/ML IV SOLN
500.0000 [IU] | Freq: Once | INTRAVENOUS | Status: AC | PRN
  Administered 2021-08-02: 500 [IU]

## 2021-08-02 MED ORDER — SODIUM CHLORIDE 0.9 % IV SOLN
10.0000 mg | Freq: Once | INTRAVENOUS | Status: AC
  Administered 2021-08-02: 10 mg via INTRAVENOUS
  Filled 2021-08-02: qty 10

## 2021-08-02 MED ORDER — SODIUM CHLORIDE 0.9% FLUSH
10.0000 mL | INTRAVENOUS | Status: DC | PRN
  Administered 2021-08-02: 10 mL

## 2021-08-02 MED ORDER — PALONOSETRON HCL INJECTION 0.25 MG/5ML
0.2500 mg | Freq: Once | INTRAVENOUS | Status: AC
  Administered 2021-08-02: 0.25 mg via INTRAVENOUS
  Filled 2021-08-02: qty 5

## 2021-08-02 MED ORDER — SODIUM CHLORIDE 0.9 % IV SOLN
75.0000 mg/m2 | Freq: Once | INTRAVENOUS | Status: AC
  Administered 2021-08-02: 130 mg via INTRAVENOUS
  Filled 2021-08-02: qty 13

## 2021-08-02 MED ORDER — DIPHENOXYLATE-ATROPINE 2.5-0.025 MG PO TABS: 1.0000 | ORAL_TABLET | Freq: Three times a day (TID) | ORAL | 3 refills | Status: DC | PRN

## 2021-08-02 MED ORDER — ACETAMINOPHEN 325 MG PO TABS
650.0000 mg | ORAL_TABLET | Freq: Once | ORAL | Status: AC
  Administered 2021-08-02: 650 mg via ORAL
  Filled 2021-08-02: qty 2

## 2021-08-02 MED ORDER — INFLUENZA VAC SPLIT QUAD 0.5 ML IM SUSY
0.5000 mL | PREFILLED_SYRINGE | Freq: Once | INTRAMUSCULAR | Status: AC
  Administered 2021-08-02: 0.5 mL via INTRAMUSCULAR
  Filled 2021-08-02: qty 0.5

## 2021-08-02 MED ORDER — SODIUM CHLORIDE 0.9 % IV SOLN
420.0000 mg | Freq: Once | INTRAVENOUS | Status: AC
  Administered 2021-08-02: 420 mg via INTRAVENOUS
  Filled 2021-08-02: qty 14

## 2021-08-02 MED ORDER — DIPHENHYDRAMINE HCL 25 MG PO CAPS
25.0000 mg | ORAL_CAPSULE | Freq: Once | ORAL | Status: AC
  Administered 2021-08-02: 25 mg via ORAL
  Filled 2021-08-02: qty 1

## 2021-08-02 MED ORDER — SODIUM CHLORIDE 0.9 % IV SOLN
700.0000 mg | Freq: Once | INTRAVENOUS | Status: AC
  Administered 2021-08-02: 700 mg via INTRAVENOUS
  Filled 2021-08-02: qty 70

## 2021-08-02 MED ORDER — SODIUM CHLORIDE 0.9 % IV SOLN: Freq: Once | INTRAVENOUS | Status: AC

## 2021-08-02 MED ORDER — SODIUM CHLORIDE 0.9 % IV SOLN
150.0000 mg | Freq: Once | INTRAVENOUS | Status: AC
  Administered 2021-08-02: 150 mg via INTRAVENOUS
  Filled 2021-08-02: qty 150

## 2021-08-02 MED ORDER — TRASTUZUMAB-DKST CHEMO 150 MG IV SOLR
6.0000 mg/kg | Freq: Once | INTRAVENOUS | Status: AC
  Administered 2021-08-02: 378 mg via INTRAVENOUS
  Filled 2021-08-02: qty 18

## 2021-08-02 MED ORDER — SODIUM CHLORIDE 0.9 % IV SOLN
150.0000 mg | Freq: Once | INTRAVENOUS | Status: DC
  Filled 2021-08-02: qty 5

## 2021-08-02 MED ORDER — SODIUM CHLORIDE 0.9% FLUSH
10.0000 mL | INTRAVENOUS | Status: DC | PRN
  Administered 2021-08-02: 10 mL via INTRAVENOUS

## 2021-08-02 NOTE — Patient Instructions (Signed)
[facility]  Discharge Instructions: Thank you for choosing [facility] to provide your oncology and hematology care.   If you have a lab appointment with the [facility], please go directly to the [facility] and check in at the registration area.   Wear comfortable clothing and clothing appropriate for easy access to any Portacath or PICC line.   We strive to give you quality time with your provider. You may need to reschedule your appointment if you arrive late (15 or more minutes).  Arriving late affects you and other patients whose appointments are after yours.  Also, if you miss three or more appointments without notifying the office, you may be dismissed from the clinic at the provider's discretion.      For prescription refill requests, have your pharmacy contact our office and allow 72 hours for refills to be completed.    Today you received the following chemotherapy and/or immunotherapy agents Trastuzumab-dkst, Pertuzumab, Docetaxel, and Carboplatin      To help prevent nausea and vomiting after your treatment, we encourage you to take your nausea medication as directed.  BELOW ARE SYMPTOMS THAT SHOULD BE REPORTED IMMEDIATELY: *FEVER GREATER THAN 100.4 F (38 C) OR HIGHER *CHILLS OR SWEATING *NAUSEA AND VOMITING THAT IS NOT CONTROLLED WITH YOUR NAUSEA MEDICATION *UNUSUAL SHORTNESS OF BREATH *UNUSUAL BRUISING OR BLEEDING *URINARY PROBLEMS (pain or burning when urinating, or frequent urination) *BOWEL PROBLEMS (unusual diarrhea, constipation, pain near the anus) TENDERNESS IN MOUTH AND THROAT WITH OR WITHOUT PRESENCE OF ULCERS (sore throat, sores in mouth, or a toothache) UNUSUAL RASH, SWELLING OR PAIN  UNUSUAL VAGINAL DISCHARGE OR ITCHING   Items with * indicate a potential emergency and should be followed up as soon as possible or go to the Emergency Department if any problems should occur.  Please show the CHEMOTHERAPY ALERT  CARD or IMMUNOTHERAPY ALERT CARD at check-in to the Emergency Department and triage nurse.  Should you have questions after your visit or need to cancel or reschedule your appointment, please contact [facility]  Dept: [phone]  and follow the prompts.  Office hours are 8:00 a.m. to 4:30 p.m. Monday - Friday. Please note that voicemails left after 4:00 p.m. may not be returned until the following business day.  We are closed weekends and major holidays. You have access to a nurse at all times for urgent questions. Please call the main number to the clinic Dept: [phone] and follow the prompts.   For any non-urgent questions, you may also contact your provider using MyChart. We now offer e-Visits for anyone 13 and older to request care online for non-urgent symptoms. For details visit [email or web address].   Also download the MyChart app! Go to the app store, search "MyChart", open the app, select [facility], and log in with your MyChart username and password.  Due to Covid, a mask is required upon entering the hospital/clinic. If you do not have a mask, one will be given to you upon arrival. For doctor visits, patients may have 1 support person aged 27 or older with them. For treatment visits, patients cannot have anyone with them due to current Covid guidelines and our immunocompromised population.

## 2021-08-02 NOTE — Assessment & Plan Note (Signed)
Palpable lump in the right breast. Diagnostic mammogram and [place]: a 3.8 cm mass at the retroareolar right breast, 1 prominent right axillary lymph node with a 4 mm cortex, 1 other which is borderline measuring 3 mm. 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 3 o'clock position: Biopsy fibroadenoma  Treatment plan: 1. Neoadjuvant chemotherapy with TCH Perjeta 6 cycles followed by Herceptin Perjeta maintenance versus Kadcyla maintenance (based on response to neoadjuvant chemo) for 1 year 2. Followed bybilateral mastectomieswith sentinel lymph node study 3. Followed by adjuvant radiation therapy 4.Followed by antiestrogen therapy 5.Followed by neratinib  Breast MRI 06/28/2021: Confluent mass right breast 11.1 x 7.8 x 9.5 cm, 1 enlarged lymph node and second borderline lymph node in the right axilla Brain MRI 07/03/2021: No evidence of malignancy. CT CAP and bone scan: 07/10/2021: Numerous enhancing right breast masses and right axillary lymph nodes, nonspecific small pulmonary nodules, bone scan: No bone metastases URCC nausea study, ------------------------------------------------------------------------------------------------------------------------------------ Current treatment: Cycle 2 TCHP to start10/02/2021 (Dignicap) Echocardiogram 07/07/2021: EF 55 to 60% Chemo toxicities: 1. Sores on the lips instructed her to use Orajel 2. Severe fatigue: As expected 3. Mild nausea 4. Occasional diarrhea   Return to clinic in 3 weeks for cycle 3

## 2021-08-04 ENCOUNTER — Other Ambulatory Visit: Payer: Self-pay

## 2021-08-04 ENCOUNTER — Inpatient Hospital Stay: Payer: BC Managed Care – PPO

## 2021-08-04 VITALS — BP 106/61 | HR 95 | Resp 16

## 2021-08-04 DIAGNOSIS — C50411 Malignant neoplasm of upper-outer quadrant of right female breast: Secondary | ICD-10-CM | POA: Diagnosis not present

## 2021-08-04 DIAGNOSIS — Z170 Estrogen receptor positive status [ER+]: Secondary | ICD-10-CM

## 2021-08-04 MED ORDER — PEGFILGRASTIM-CBQV 6 MG/0.6ML ~~LOC~~ SOSY
6.0000 mg | PREFILLED_SYRINGE | Freq: Once | SUBCUTANEOUS | Status: AC
  Administered 2021-08-04: 6 mg via SUBCUTANEOUS

## 2021-08-04 MED ORDER — GOSERELIN ACETATE 3.6 MG ~~LOC~~ IMPL
3.6000 mg | DRUG_IMPLANT | Freq: Once | SUBCUTANEOUS | Status: AC
  Administered 2021-08-04: 3.6 mg via SUBCUTANEOUS
  Filled 2021-08-04: qty 3.6

## 2021-08-04 NOTE — Patient Instructions (Addendum)
Goserelin injection What is this medication? GOSERELIN (GOE se rel in) is similar to a hormone found in the body. It lowers the amount of sex hormones that the body makes. Men will have lower testosterone levels and women will have lower estrogen levels while taking this medicine. In men, this medicine is used to treat prostate cancer; the injection is either given once per month or once every 12 weeks. A once per month injection (only) is used to treat women with endometriosis, dysfunctional uterine bleeding, or advanced breast cancer. This medicine may be used for other purposes; ask your health care provider or pharmacist if you have questions. COMMON BRAND NAME(S): Zoladex What should I tell my care team before I take this medication? They need to know if you have any of these conditions: bone problems diabetes heart disease history of irregular heartbeat an unusual or allergic reaction to goserelin, other medicines, foods, dyes, or preservatives pregnant or trying to get pregnant breast-feeding How should I use this medication? This medicine is for injection under the skin. It is given by a health care professional in a hospital or clinic setting. Talk to your pediatrician regarding the use of this medicine in children. Special care may be needed. Overdosage: If you think you have taken too much of this medicine contact a poison control center or emergency room at once. NOTE: This medicine is only for you. Do not share this medicine with others. What if I miss a dose? It is important not to miss your dose. Call your doctor or health care professional if you are unable to keep an appointment. What may interact with this medication? Do not take this medicine with any of the following medications: cisapride dronedarone pimozide thioridazine This medicine may also interact with the following medications: other medicines that prolong the QT interval (an abnormal heart rhythm) This list  may not describe all possible interactions. Give your health care provider a list of all the medicines, herbs, non-prescription drugs, or dietary supplements you use. Also tell them if you smoke, drink alcohol, or use illegal drugs. Some items may interact with your medicine. What should I watch for while using this medication? Visit your doctor or health care provider for regular checks on your progress. Your symptoms may appear to get worse during the first weeks of this therapy. Tell your doctor or healthcare provider if your symptoms do not start to get better or if they get worse after this time. Your bones may get weaker if you take this medicine for a long time. If you smoke or frequently drink alcohol you may increase your risk of bone loss. A family history of osteoporosis, chronic use of drugs for seizures (convulsions), or corticosteroids can also increase your risk of bone loss. Talk to your doctor about how to keep your bones strong. This medicine should stop regular monthly menstruation in women. Tell your doctor if you continue to menstruate. Women should not become pregnant while taking this medicine or for 12 weeks after stopping this medicine. Women should inform their doctor if they wish to become pregnant or think they might be pregnant. There is a potential for serious side effects to an unborn child. Talk to your health care professional or pharmacist for more information. Do not breast-feed an infant while taking this medicine. Men should inform their doctors if they wish to father a child. This medicine may lower sperm counts. Talk to your health care professional or pharmacist for more information. This medicine may   increase blood sugar. Ask your healthcare provider if changes in diet or medicines are needed if you have diabetes. What side effects may I notice from receiving this medication? Side effects that you should report to your doctor or health care professional as soon as  possible: allergic reactions like skin rash, itching or hives, swelling of the face, lips, or tongue bone pain breathing problems changes in vision chest pain feeling faint or lightheaded, falls fever, chills pain, swelling, warmth in the leg pain, tingling, numbness in the hands or feet signs and symptoms of high blood sugar such as being more thirsty or hungry or having to urinate more than normal. You may also feel very tired or have blurry vision signs and symptoms of low blood pressure like dizziness; feeling faint or lightheaded, falls; unusually weak or tired stomach pain swelling of the ankles, feet, hands trouble passing urine or change in the amount of urine unusually high or low blood pressure unusually weak or tired Side effects that usually do not require medical attention (report to your doctor or health care professional if they continue or are bothersome): change in sex drive or performance changes in breast size in both males and females changes in emotions or moods headache hot flashes irritation at site where injected loss of appetite skin problems like acne, dry skin vaginal dryness This list may not describe all possible side effects. Call your doctor for medical advice about side effects. You may report side effects to FDA at 1-800-FDA-1088. Where should I keep my medication? This drug is given in a hospital or clinic and will not be stored at home. NOTE: This sheet is a summary. It may not cover all possible information. If you have questions about this medicine, talk to your doctor, pharmacist, or health care provider.  2022 Elsevier/Gold Standard (2019-01-12 14:05:56)   Pegfilgrastim injection What is this medication? PEGFILGRASTIM (PEG fil gra stim) is a long-acting granulocyte colony-stimulating factor that stimulates the growth of neutrophils, a type of white blood cell important in the body's fight against infection. It is used to reduce the incidence  of fever and infection in patients with certain types of cancer who are receiving chemotherapy that affects the bone marrow, and to increase survival after being exposed to high doses of radiation. This medicine may be used for other purposes; ask your health care provider or pharmacist if you have questions. COMMON BRAND NAME(S): Rexene Edison, Ziextenzo What should I tell my care team before I take this medication? They need to know if you have any of these conditions: kidney disease latex allergy ongoing radiation therapy sickle cell disease skin reactions to acrylic adhesives (On-Body Injector only) an unusual or allergic reaction to pegfilgrastim, filgrastim, other medicines, foods, dyes, or preservatives pregnant or trying to get pregnant breast-feeding How should I use this medication? This medicine is for injection under the skin. If you get this medicine at home, you will be taught how to prepare and give the pre-filled syringe or how to use the On-body Injector. Refer to the patient Instructions for Use for detailed instructions. Use exactly as directed. Tell your healthcare provider immediately if you suspect that the On-body Injector may not have performed as intended or if you suspect the use of the On-body Injector resulted in a missed or partial dose. It is important that you put your used needles and syringes in a special sharps container. Do not put them in a trash can. If you do not have  a sharps container, call your pharmacist or healthcare provider to get one. Talk to your pediatrician regarding the use of this medicine in children. While this drug may be prescribed for selected conditions, precautions do apply. Overdosage: If you think you have taken too much of this medicine contact a poison control center or emergency room at once. NOTE: This medicine is only for you. Do not share this medicine with others. What if I miss a dose? It is important not  to miss your dose. Call your doctor or health care professional if you miss your dose. If you miss a dose due to an On-body Injector failure or leakage, a new dose should be administered as soon as possible using a single prefilled syringe for manual use. What may interact with this medication? Interactions have not been studied. This list may not describe all possible interactions. Give your health care provider a list of all the medicines, herbs, non-prescription drugs, or dietary supplements you use. Also tell them if you smoke, drink alcohol, or use illegal drugs. Some items may interact with your medicine. What should I watch for while using this medication? Your condition will be monitored carefully while you are receiving this medicine. You may need blood work done while you are taking this medicine. Talk to your health care provider about your risk of cancer. You may be more at risk for certain types of cancer if you take this medicine. If you are going to need a MRI, CT scan, or other procedure, tell your doctor that you are using this medicine (On-Body Injector only). What side effects may I notice from receiving this medication? Side effects that you should report to your doctor or health care professional as soon as possible: allergic reactions (skin rash, itching or hives, swelling of the face, lips, or tongue) back pain dizziness fever pain, redness, or irritation at site where injected pinpoint red spots on the skin red or dark-brown urine shortness of breath or breathing problems stomach or side pain, or pain at the shoulder swelling tiredness trouble passing urine or change in the amount of urine unusual bruising or bleeding Side effects that usually do not require medical attention (report to your doctor or health care professional if they continue or are bothersome): bone pain muscle pain This list may not describe all possible side effects. Call your doctor for medical  advice about side effects. You may report side effects to FDA at 1-800-FDA-1088. Where should I keep my medication? Keep out of the reach of children. If you are using this medicine at home, you will be instructed on how to store it. Throw away any unused medicine after the expiration date on the label. NOTE: This sheet is a summary. It may not cover all possible information. If you have questions about this medicine, talk to your doctor, pharmacist, or health care provider.  2022 Elsevier/Gold Standard (2020-10-21 11:54:14)

## 2021-08-21 ENCOUNTER — Encounter: Payer: Self-pay | Admitting: *Deleted

## 2021-08-23 ENCOUNTER — Other Ambulatory Visit: Payer: BC Managed Care – PPO

## 2021-08-23 ENCOUNTER — Ambulatory Visit: Payer: BC Managed Care – PPO

## 2021-08-23 ENCOUNTER — Ambulatory Visit: Payer: BC Managed Care – PPO | Admitting: Hematology and Oncology

## 2021-08-23 MED FILL — Fosaprepitant Dimeglumine For IV Infusion 150 MG (Base Eq): INTRAVENOUS | Qty: 5 | Status: AC

## 2021-08-23 NOTE — Progress Notes (Signed)
Patient Care Team: Dian Queen, MD as PCP - General (Obstetrics and Gynecology) Dian Queen, MD (Obstetrics and Gynecology) Mauro Kaufmann, RN as Oncology Nurse Navigator Rockwell Germany, RN as Oncology Nurse Navigator  DIAGNOSIS:    ICD-10-CM   1. Malignant neoplasm of upper-outer quadrant of right breast in female, estrogen receptor positive ([place])  C50.411    Z17.0       SUMMARY OF ONCOLOGIC HISTORY: Oncology History  Malignant neoplasm of upper-outer quadrant of right breast in female, estrogen receptor positive ([place])  06/23/2021 Initial Diagnosis   Palpable lump in the right breast. Diagnostic mammogram and [place]: a 3.8 cm mass at the retroareolar right breast, 1 prominent right axillary lymph node with a 4 mm cortex, 1 other which is borderline measuring 3 mm. 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  3 o'clock position: Biopsy fibroadenoma   07/12/2021 -  Chemotherapy   Patient is on Treatment Plan : BREAST  Docetaxel + Carboplatin + Trastuzumab + Pertuzumab  (TCHP) q21d      07/28/2021 Genetic Testing   Negative hereditary cancer genetic testing: no pathogenic variants detected in [facility] CancerNext-Expanded +RNAinsight Panel.  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  The report date is July 28, 2021.   The CancerNext-Expanded gene panel offered by [facility] and includes sequencing, rearrangement, and RNA analysis for the following 77 genes: AIP, ALK, APC, ATM, AXIN2, BAP1, BARD1, BLM, BMPR1A, BRCA1, BRCA2, BRIP1, CDC73, CDH1, CDK4, CDKN1B, CDKN2A, CHEK2, CTNNA1, DICER1, FANCC, FH, FLCN, GALNT12, KIF1B, LZTR1, MAX, MEN1, MET, MLH1, MSH2, MSH3, MSH6, MUTYH, NBN, NF1, NF2, NTHL1, PALB2, PHOX2B, PMS2, POT1, PRKAR1A, PTCH1, PTEN, RAD51C, RAD51D, RB1, RECQL, RET, SDHA, SDHAF2, SDHB, SDHC, SDHD, SMAD4, SMARCA4, SMARCB1, SMARCE1, STK11, SUFU, TMEM127, TP53, TSC1,  TSC2, VHL and XRCC2 (sequencing and deletion/duplication); EGFR, EGLN1, HOXB13, KIT, MITF, PDGFRA, POLD1, and POLE (sequencing only); EPCAM and GREM1 (deletion/duplication only).      CHIEF COMPLIANT: Cycle 3 TCHP  INTERVAL HISTORY: Jenna Cross is a 35 y.o. with above-mentioned history of right breast cancer. She presents today for cycle 3 of TCHP.  She is tolerating chemotherapy reasonably well.  She does have intermittent diarrhea for which she takes Imodium.  She also has had more nausea after this 1 cycle of chemotherapy.  It started on day 5 and day 6.  Denies any peripheral neuropathy.  ALLERGIES:  has No Known Allergies.  MEDICATIONS:  Current Outpatient Medications  Medication Sig Dispense Refill   acetaminophen (TYLENOL) 500 MG tablet Take 1,000 mg by mouth every 6 (six) hours as needed for mild pain or headache.     cetirizine (ZYRTEC) 10 MG tablet Take 10 mg by mouth at bedtime. (Patient not taking: Reported on 07/14/2021)     clindamycin (CLINDAGEL) 1 % gel Apply topically 2 (two) times daily. 30 g 3   dexamethasone (DECADRON) 4 MG tablet Take 1 tablet (4 mg total) by mouth daily. Take 1 tablet day before chemo and 1 tablet day after chemo with food 12 tablet 0   diphenoxylate-atropine (LOMOTIL) 2.5-0.025 MG tablet Take 1 tablet by mouth 3 (three) times daily as needed for diarrhea or loose stools. 30 tablet 3   escitalopram (LEXAPRO) 20 MG tablet Take 20 mg by mouth daily.     ferrous sulfate 325 (65 FE) MG EC tablet Take 1 tablet (325 mg total) by mouth 2 (two) times daily. (Patient not taking: Reported on 07/14/2021) 60 tablet  2   ibuprofen (ADVIL) 600 MG tablet Take 1 tablet (600 mg total) by mouth every 6 (six) hours as needed. (Patient not taking: Reported on 07/14/2021) 30 tablet 0   lidocaine-prilocaine (EMLA) cream Apply to affected area once 30 g 3   loratadine (CLARITIN) 10 MG tablet Take 10 mg by mouth daily.     LORazepam (ATIVAN) 0.5 MG tablet Take 1 tablet (0.5  mg total) by mouth at bedtime as needed (Nausea or vomiting). 30 tablet 0   magic mouthwash w/lidocaine SOLN Take 5 mLs by mouth 3 (three) times daily as needed for mouth pain. 100 mL 0   ondansetron (ZOFRAN) 8 MG tablet Take 1 tablet (8 mg total) by mouth 2 (two) times daily as needed (Nausea or vomiting). Start on the third day after chemotherapy. 30 tablet 1   prochlorperazine (COMPAZINE) 10 MG tablet Take 1 tablet (10 mg total) by mouth every 6 (six) hours as needed (Nausea or vomiting). 30 tablet 1   traMADol (ULTRAM) 50 MG tablet Take 2 tablets (100 mg total) by mouth every 6 (six) hours as needed. (Patient not taking: Reported on 07/14/2021) 10 tablet 0   valACYclovir (VALTREX) 500 MG tablet Take 500 mg by mouth 2 (two) times daily.     No current facility-administered medications for this visit.    PHYSICAL EXAMINATION: ECOG PERFORMANCE STATUS: 1 - Symptomatic but completely ambulatory  Vitals:   08/24/21 0916  BP: 119/61  Pulse: 90  Resp: 16  Temp: 97.8 F (36.6 C)  SpO2: 99%   Filed Weights   08/24/21 0916  Weight: 139 lb (63 kg)      LABORATORY DATA:  I have reviewed the data as listed CMP Latest Ref Rng & Units 08/02/2021 07/19/2021 07/17/2021  Glucose 70 - 99 mg/dL 102(H) 87 101(H)  BUN 6 - 20 mg/dL 14 10 10   Creatinine 0.44 - 1.00 mg/dL 0.72 0.70 0.81  Sodium 135 - 145 mmol/L 139 141 138  Potassium 3.5 - 5.1 mmol/L 3.8 4.1 4.7  Chloride 98 - 111 mmol/L 108 108 104  CO2 22 - 32 mmol/L 20(L) 26 26  Calcium 8.9 - 10.3 mg/dL 9.1 9.7 9.5  Total Protein 6.5 - 8.1 g/dL 7.4 7.2 7.5  Total Bilirubin 0.3 - 1.2 mg/dL 0.5 0.8 1.5(H)  Alkaline Phos 38 - 126 U/L 92 77 95  AST 15 - 41 U/L 15 27 25   ALT 0 - 44 U/L 12 28 26     Lab Results  Component Value Date   WBC 4.7 08/24/2021   HGB 10.8 (L) 08/24/2021   HCT 32.2 (L) 08/24/2021   MCV 97.0 08/24/2021   PLT 284 08/24/2021   NEUTROABS 2.5 08/24/2021    ASSESSMENT & PLAN:  Malignant neoplasm of upper-outer  quadrant of right breast in female, estrogen receptor positive ([facility]) Palpable lump in the right breast. Diagnostic mammogram and [place]: a 3.8 cm mass at the retroareolar right breast, 1 prominent right axillary lymph node with a 4 mm cortex, 1 other which is borderline measuring 3 mm. 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  3 o'clock position: Biopsy fibroadenoma   Treatment plan: 1. Neoadjuvant chemotherapy with TCH Perjeta 6 cycles followed by Herceptin Perjeta maintenance versus Kadcyla maintenance (based on response to neoadjuvant chemo) for 1 year 2. Followed by bilateral mastectomies with sentinel lymph node study 3. Followed by adjuvant radiation therapy  4.  Followed by antiestrogen therapy 5.  Followed by neratinib  Breast MRI 06/28/2021: Confluent mass right breast 11.1 x 7.8 x 9.5 cm, 1 enlarged lymph node and second borderline lymph node in the right axilla Brain MRI 07/03/2021: No evidence of malignancy. CT CAP and bone scan: 07/10/2021: Numerous enhancing right breast masses and right axillary lymph nodes, nonspecific small pulmonary nodules, bone scan: No bone metastases URCC nausea study, ------------------------------------------------------------------------------------------------------------------------------------ Current treatment: Cycle 3 TCHP started 07/12/2021 (Dignicap) Echocardiogram 07/07/2021: EF 55 to 60% Chemo toxicities: Severe fatigue: As expected Mild nausea Occasional diarrhea:   well controlled with Imodium we will send for Lomotil. 4.  Tachycardia: Could be related to Taxotere versus dehydration we will wait for the renal function to come back. Return to clinic in 3 weeks for cycle 4  She had several questions related to what happens after chemotherapy.  We discussed the overall plan to do an MRI and surgical referral to do mastectomies.  She will still need radiation in spite of  mastectomy because of the lymph node involvement.  Depending on the final surgical path results we may consider Kadcyla versus Herceptin Perjeta maintenance.  Patient is contemplating on having it baby after all of this is done.  In that case we may hold off on doing any antiestrogen therapies until she has her baby.  She understands that pregnancy does not increase her risk of breast cancer.  She is concerned about the waiting for at least a year or more until she can start antiestrogen treatment.    No orders of the defined types were placed in this encounter.  The patient has a good understanding of the overall plan. she agrees with it. she will call with any problems that may develop before the next visit here.  Total time spent: 20 mins including face to face time and time spent for planning, charting and coordination of care  Rulon Eisenmenger, MD, MPH 08/24/2021  I, Thana Ates, am acting as scribe for Dr. Nicholas Lose.  I have reviewed the above documentation for accuracy and completeness, and I agree with the above.

## 2021-08-23 NOTE — Progress Notes (Signed)
The following biosimilar Kanjinti (trastuzumab-anns) has been selected for use in this patient.  Kennith Center, Pharm.D., CPP 08/23/2021@12 :25 PM

## 2021-08-23 NOTE — Progress Notes (Signed)
The following biosimilar Ziextenzo (pegfilgrastim-bmez) has been selected for use in this patient.  Kennith Center, Pharm.D., CPP 08/23/2021@12 :31 PM

## 2021-08-24 ENCOUNTER — Inpatient Hospital Stay: Payer: 59 | Admitting: Hematology and Oncology

## 2021-08-24 ENCOUNTER — Other Ambulatory Visit: Payer: Self-pay

## 2021-08-24 ENCOUNTER — Inpatient Hospital Stay: Payer: 59 | Attending: Hematology and Oncology

## 2021-08-24 ENCOUNTER — Inpatient Hospital Stay: Payer: 59

## 2021-08-24 ENCOUNTER — Other Ambulatory Visit: Payer: Self-pay | Admitting: *Deleted

## 2021-08-24 VITALS — BP 120/59 | HR 92 | Temp 97.9°F | Resp 18

## 2021-08-24 DIAGNOSIS — R5383 Other fatigue: Secondary | ICD-10-CM | POA: Insufficient documentation

## 2021-08-24 DIAGNOSIS — C50411 Malignant neoplasm of upper-outer quadrant of right female breast: Secondary | ICD-10-CM | POA: Diagnosis not present

## 2021-08-24 DIAGNOSIS — Z5112 Encounter for antineoplastic immunotherapy: Secondary | ICD-10-CM | POA: Diagnosis present

## 2021-08-24 DIAGNOSIS — R110 Nausea: Secondary | ICD-10-CM | POA: Diagnosis not present

## 2021-08-24 DIAGNOSIS — R197 Diarrhea, unspecified: Secondary | ICD-10-CM | POA: Diagnosis not present

## 2021-08-24 DIAGNOSIS — Z79899 Other long term (current) drug therapy: Secondary | ICD-10-CM | POA: Insufficient documentation

## 2021-08-24 DIAGNOSIS — R000 Tachycardia, unspecified: Secondary | ICD-10-CM | POA: Diagnosis not present

## 2021-08-24 DIAGNOSIS — Z170 Estrogen receptor positive status [ER+]: Secondary | ICD-10-CM

## 2021-08-24 DIAGNOSIS — Z5111 Encounter for antineoplastic chemotherapy: Secondary | ICD-10-CM | POA: Diagnosis not present

## 2021-08-24 DIAGNOSIS — Z5189 Encounter for other specified aftercare: Secondary | ICD-10-CM | POA: Insufficient documentation

## 2021-08-24 LAB — COMPREHENSIVE METABOLIC PANEL
ALT: 15 U/L (ref 0–44)
AST: 15 U/L (ref 15–41)
Albumin: 3.6 g/dL (ref 3.5–5.0)
Alkaline Phosphatase: 103 U/L (ref 38–126)
Anion gap: 8 (ref 5–15)
BUN: 12 mg/dL (ref 6–20)
CO2: 24 mmol/L (ref 22–32)
Calcium: 8.4 mg/dL — ABNORMAL LOW (ref 8.9–10.3)
Chloride: 110 mmol/L (ref 98–111)
Creatinine, Ser: 0.73 mg/dL (ref 0.44–1.00)
GFR, Estimated: >60 mL/min (ref >60)
Glucose, Bld: 91 mg/dL (ref 70–99)
Potassium: 3.8 mmol/L (ref 3.5–5.1)
Sodium: 142 mmol/L (ref 135–145)
Total Bilirubin: 0.3 mg/dL (ref 0.3–1.2)
Total Protein: 6.4 g/dL — ABNORMAL LOW (ref 6.5–8.1)

## 2021-08-24 LAB — CBC WITH DIFFERENTIAL/PLATELET
Abs Immature Granulocytes: 0.02 10*3/uL (ref 0.00–0.07)
Basophils Absolute: 0.1 10*3/uL (ref 0.0–0.1)
Basophils Relative: 1 %
Eosinophils Absolute: 0.1 10*3/uL (ref 0.0–0.5)
Eosinophils Relative: 1 %
HCT: 32.2 % — ABNORMAL LOW (ref 36.0–46.0)
Hemoglobin: 10.8 g/dL — ABNORMAL LOW (ref 12.0–15.0)
Immature Granulocytes: 0 %
Lymphocytes Relative: 38 %
Lymphs Abs: 1.8 10*3/uL (ref 0.7–4.0)
MCH: 32.5 pg (ref 26.0–34.0)
MCHC: 33.5 g/dL (ref 30.0–36.0)
MCV: 97 fL (ref 80.0–100.0)
Monocytes Absolute: 0.3 10*3/uL (ref 0.1–1.0)
Monocytes Relative: 6 %
Neutro Abs: 2.5 10*3/uL (ref 1.7–7.7)
Neutrophils Relative %: 54 %
Platelets: 284 10*3/uL (ref 150–400)
RBC: 3.32 MIL/uL — ABNORMAL LOW (ref 3.87–5.11)
RDW: 15.5 % (ref 11.5–15.5)
WBC: 4.7 10*3/uL (ref 4.0–10.5)
nRBC: 0 % (ref 0.0–0.2)

## 2021-08-24 LAB — PREGNANCY, URINE: Preg Test, Ur: NEGATIVE

## 2021-08-24 MED ORDER — SODIUM CHLORIDE 0.9 % IV SOLN
75.0000 mg/m2 | Freq: Once | INTRAVENOUS | Status: AC
  Administered 2021-08-24: 13:00:00 130 mg via INTRAVENOUS
  Filled 2021-08-24: qty 13

## 2021-08-24 MED ORDER — SODIUM CHLORIDE 0.9 % IV SOLN
150.0000 mg | Freq: Once | INTRAVENOUS | Status: AC
  Administered 2021-08-24: 10:00:00 150 mg via INTRAVENOUS
  Filled 2021-08-24: qty 150

## 2021-08-24 MED ORDER — SODIUM CHLORIDE 0.9 % IV SOLN
420.0000 mg | Freq: Once | INTRAVENOUS | Status: AC
  Administered 2021-08-24: 420 mg via INTRAVENOUS
  Filled 2021-08-24: qty 14

## 2021-08-24 MED ORDER — TRASTUZUMAB-ANNS CHEMO 150 MG IV SOLR
6.0000 mg/kg | Freq: Once | INTRAVENOUS | Status: AC
  Administered 2021-08-24: 11:00:00 378 mg via INTRAVENOUS
  Filled 2021-08-24: qty 18

## 2021-08-24 MED ORDER — SODIUM CHLORIDE 0.9 % IV SOLN
10.0000 mg | Freq: Once | INTRAVENOUS | Status: AC
  Administered 2021-08-24: 10:00:00 10 mg via INTRAVENOUS
  Filled 2021-08-24: qty 10

## 2021-08-24 MED ORDER — SODIUM CHLORIDE 0.9 % IV SOLN: Freq: Once | INTRAVENOUS | Status: AC

## 2021-08-24 MED ORDER — ACETAMINOPHEN 325 MG PO TABS
650.0000 mg | ORAL_TABLET | Freq: Once | ORAL | Status: AC
  Administered 2021-08-24: 10:00:00 650 mg via ORAL
  Filled 2021-08-24: qty 2

## 2021-08-24 MED ORDER — SODIUM CHLORIDE 0.9% FLUSH
10.0000 mL | INTRAVENOUS | Status: DC | PRN
  Administered 2021-08-24: 15:00:00 10 mL

## 2021-08-24 MED ORDER — SODIUM CHLORIDE 0.9 % IV SOLN
700.0000 mg | Freq: Once | INTRAVENOUS | Status: AC
  Administered 2021-08-24: 14:00:00 700 mg via INTRAVENOUS
  Filled 2021-08-24: qty 70

## 2021-08-24 MED ORDER — HEPARIN SOD (PORK) LOCK FLUSH 100 UNIT/ML IV SOLN
500.0000 [IU] | Freq: Once | INTRAVENOUS | Status: AC | PRN
  Administered 2021-08-24: 15:00:00 500 [IU]

## 2021-08-24 MED ORDER — DIPHENHYDRAMINE HCL 25 MG PO CAPS
25.0000 mg | ORAL_CAPSULE | Freq: Once | ORAL | Status: AC
  Administered 2021-08-24: 10:00:00 25 mg via ORAL
  Filled 2021-08-24: qty 1

## 2021-08-24 MED ORDER — PALONOSETRON HCL INJECTION 0.25 MG/5ML
0.2500 mg | Freq: Once | INTRAVENOUS | Status: AC
  Administered 2021-08-24: 10:00:00 0.25 mg via INTRAVENOUS
  Filled 2021-08-24: qty 5

## 2021-08-24 NOTE — Patient Instructions (Signed)
[facility]  Discharge Instructions: Thank you for choosing [facility] to provide your oncology and hematology care.   If you have a lab appointment with the [facility], please go directly to the [facility] and check in at the registration area.   Wear comfortable clothing and clothing appropriate for easy access to any Portacath or PICC line.   We strive to give you quality time with your provider. You may need to reschedule your appointment if you arrive late (15 or more minutes).  Arriving late affects you and other patients whose appointments are after yours.  Also, if you miss three or more appointments without notifying the office, you may be dismissed from the clinic at the provider's discretion.      For prescription refill requests, have your pharmacy contact our office and allow 72 hours for refills to be completed.    Today you received the following chemotherapy and/or immunotherapy agents Trastuzumab-dkst, Pertuzumab, Docetaxel, and Carboplatin      To help prevent nausea and vomiting after your treatment, we encourage you to take your nausea medication as directed.  BELOW ARE SYMPTOMS THAT SHOULD BE REPORTED IMMEDIATELY: *FEVER GREATER THAN 100.4 F (38 C) OR HIGHER *CHILLS OR SWEATING *NAUSEA AND VOMITING THAT IS NOT CONTROLLED WITH YOUR NAUSEA MEDICATION *UNUSUAL SHORTNESS OF BREATH *UNUSUAL BRUISING OR BLEEDING *URINARY PROBLEMS (pain or burning when urinating, or frequent urination) *BOWEL PROBLEMS (unusual diarrhea, constipation, pain near the anus) TENDERNESS IN MOUTH AND THROAT WITH OR WITHOUT PRESENCE OF ULCERS (sore throat, sores in mouth, or a toothache) UNUSUAL RASH, SWELLING OR PAIN  UNUSUAL VAGINAL DISCHARGE OR ITCHING   Items with * indicate a potential emergency and should be followed up as soon as possible or go to the Emergency Department if any problems should occur.  Please show the CHEMOTHERAPY ALERT  CARD or IMMUNOTHERAPY ALERT CARD at check-in to the Emergency Department and triage nurse.  Should you have questions after your visit or need to cancel or reschedule your appointment, please contact [facility]  Dept: [phone]  and follow the prompts.  Office hours are 8:00 a.m. to 4:30 p.m. Monday - Friday. Please note that voicemails left after 4:00 p.m. may not be returned until the following business day.  We are closed weekends and major holidays. You have access to a nurse at all times for urgent questions. Please call the main number to the clinic Dept: [phone] and follow the prompts.   For any non-urgent questions, you may also contact your provider using MyChart. We now offer e-Visits for anyone 13 and older to request care online for non-urgent symptoms. For details visit [email or web address].   Also download the MyChart app! Go to the app store, search "MyChart", open the app, select [facility], and log in with your MyChart username and password.  Due to Covid, a mask is required upon entering the hospital/clinic. If you do not have a mask, one will be given to you upon arrival. For doctor visits, patients may have 1 support person aged 27 or older with them. For treatment visits, patients cannot have anyone with them due to current Covid guidelines and our immunocompromised population.

## 2021-08-24 NOTE — Progress Notes (Signed)
Pt requesting IVF and antiemetics next week due to dehydration after receiving chemotherapy. Verbal orders received for pt to receive 1 L NS over 2 hours as well as 8 mg IV zofran.  Orders placed under sign and held.

## 2021-08-24 NOTE — Assessment & Plan Note (Signed)
Palpable lump in the right breast. Diagnostic mammogram and [place]: a 3.8 cm mass at the retroareolar right breast, 1 prominent right axillary lymph node with a 4 mm cortex, 1 other which is borderline measuring 3 mm. 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 3 o'clock position: Biopsy fibroadenoma  Treatment plan: 1. Neoadjuvant chemotherapy with TCH Perjeta 6 cycles followed by Herceptin Perjeta maintenance versus Kadcyla maintenance (based on response to neoadjuvant chemo) for 1 year 2. Followed bybilateral mastectomieswith sentinel lymph node study 3. Followed by adjuvant radiation therapy 4.Followed by antiestrogen therapy 5.Followed by neratinib  Breast MRI 06/28/2021: Confluent mass right breast 11.1 x 7.8 x 9.5 cm, 1 enlarged lymph node and second borderline lymph node in the right axilla Brain MRI 07/03/2021: No evidence of malignancy. CT CAP and bone scan:07/10/2021: Numerous enhancing right breast masses and right axillary lymph nodes, nonspecific small pulmonary nodules,bone scan: No bone metastases URCC nausea study, ------------------------------------------------------------------------------------------------------------------------------------ Current treatment:Cycle 3 TCHP started10/02/2021 (Dignicap) Echocardiogram 07/07/2021: EF 55 to 60% Chemo toxicities: 1. Sores on the lips: Resolved 2. Severe fatigue: As expected 3. Mild nausea 4. Occasional diarrhea: Not well controlled with Imodium we will send for Lomotil.  5.  Tachycardia: Could be related to Taxotere versus dehydration we will wait for the renal function to come back. Return to clinic in 3 weeks for cycle 4

## 2021-08-25 ENCOUNTER — Ambulatory Visit: Payer: BC Managed Care – PPO

## 2021-08-26 ENCOUNTER — Other Ambulatory Visit: Payer: Self-pay

## 2021-08-26 ENCOUNTER — Inpatient Hospital Stay: Payer: 59

## 2021-08-26 VITALS — BP 103/79 | HR 97 | Temp 97.5°F | Resp 18

## 2021-08-26 DIAGNOSIS — Z5111 Encounter for antineoplastic chemotherapy: Secondary | ICD-10-CM | POA: Diagnosis not present

## 2021-08-26 DIAGNOSIS — C50411 Malignant neoplasm of upper-outer quadrant of right female breast: Secondary | ICD-10-CM

## 2021-08-26 DIAGNOSIS — Z170 Estrogen receptor positive status [ER+]: Secondary | ICD-10-CM

## 2021-08-26 MED ORDER — PEGFILGRASTIM-BMEZ 6 MG/0.6ML ~~LOC~~ SOSY
6.0000 mg | PREFILLED_SYRINGE | Freq: Once | SUBCUTANEOUS | Status: AC
  Administered 2021-08-26: 6 mg via SUBCUTANEOUS
  Filled 2021-08-26: qty 0.6

## 2021-08-30 ENCOUNTER — Inpatient Hospital Stay: Payer: 59

## 2021-09-04 ENCOUNTER — Encounter: Payer: Self-pay | Admitting: Hematology and Oncology

## 2021-09-07 ENCOUNTER — Inpatient Hospital Stay: Payer: 59 | Attending: Hematology and Oncology

## 2021-09-07 ENCOUNTER — Other Ambulatory Visit: Payer: Self-pay

## 2021-09-07 VITALS — BP 105/51 | HR 84 | Temp 98.4°F | Resp 17

## 2021-09-07 DIAGNOSIS — R197 Diarrhea, unspecified: Secondary | ICD-10-CM | POA: Insufficient documentation

## 2021-09-07 DIAGNOSIS — R5383 Other fatigue: Secondary | ICD-10-CM | POA: Insufficient documentation

## 2021-09-07 DIAGNOSIS — Z87891 Personal history of nicotine dependence: Secondary | ICD-10-CM | POA: Diagnosis not present

## 2021-09-07 DIAGNOSIS — F419 Anxiety disorder, unspecified: Secondary | ICD-10-CM | POA: Diagnosis not present

## 2021-09-07 DIAGNOSIS — Z9013 Acquired absence of bilateral breasts and nipples: Secondary | ICD-10-CM | POA: Diagnosis not present

## 2021-09-07 DIAGNOSIS — R110 Nausea: Secondary | ICD-10-CM | POA: Diagnosis not present

## 2021-09-07 DIAGNOSIS — Z5112 Encounter for antineoplastic immunotherapy: Secondary | ICD-10-CM | POA: Diagnosis present

## 2021-09-07 DIAGNOSIS — Z8042 Family history of malignant neoplasm of prostate: Secondary | ICD-10-CM | POA: Diagnosis not present

## 2021-09-07 DIAGNOSIS — Z5111 Encounter for antineoplastic chemotherapy: Secondary | ICD-10-CM | POA: Insufficient documentation

## 2021-09-07 DIAGNOSIS — Z806 Family history of leukemia: Secondary | ICD-10-CM | POA: Insufficient documentation

## 2021-09-07 DIAGNOSIS — Z923 Personal history of irradiation: Secondary | ICD-10-CM | POA: Diagnosis not present

## 2021-09-07 DIAGNOSIS — Z7901 Long term (current) use of anticoagulants: Secondary | ICD-10-CM | POA: Diagnosis not present

## 2021-09-07 DIAGNOSIS — Z5189 Encounter for other specified aftercare: Secondary | ICD-10-CM | POA: Diagnosis not present

## 2021-09-07 DIAGNOSIS — Z170 Estrogen receptor positive status [ER+]: Secondary | ICD-10-CM

## 2021-09-07 DIAGNOSIS — I82C12 Acute embolism and thrombosis of left internal jugular vein: Secondary | ICD-10-CM | POA: Insufficient documentation

## 2021-09-07 DIAGNOSIS — C50411 Malignant neoplasm of upper-outer quadrant of right female breast: Secondary | ICD-10-CM | POA: Insufficient documentation

## 2021-09-07 MED ORDER — GOSERELIN ACETATE 3.6 MG ~~LOC~~ IMPL
3.6000 mg | DRUG_IMPLANT | Freq: Once | SUBCUTANEOUS | Status: AC
  Administered 2021-09-07: 3.6 mg via SUBCUTANEOUS
  Filled 2021-09-07: qty 3.6

## 2021-09-13 MED FILL — Dexamethasone Sodium Phosphate Inj 100 MG/10ML: INTRAMUSCULAR | Qty: 1 | Status: AC

## 2021-09-13 MED FILL — Fosaprepitant Dimeglumine For IV Infusion 150 MG (Base Eq): INTRAVENOUS | Qty: 5 | Status: AC

## 2021-09-13 NOTE — Assessment & Plan Note (Addendum)
Palpable lump in the right breast. Diagnostic mammogram and [place]: a 3.8 cm mass at the retroareolar right breast, 1 prominent right axillary lymph node with a 4 mm cortex, 1 other which is borderline measuring 3 mm. 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 3 o'clock position: Biopsy fibroadenoma  Treatment plan: 1. Neoadjuvant chemotherapy with TCH Perjeta 6 cycles followed by Herceptin Perjeta maintenance versus Kadcyla maintenance (based on response to neoadjuvant chemo) for 1 year 2. Followed bybilateral mastectomieswith sentinel lymph node study 3. Followed by adjuvant radiation therapy 4.Followed by antiestrogen therapy 5.Followed by neratinib  Breast MRI 06/28/2021: Confluent mass right breast 11.1 x 7.8 x 9.5 cm, 1 enlarged lymph node and second borderline lymph node in the right axilla Brain MRI 07/03/2021: No evidence of malignancy. CT CAP and bone scan:07/10/2021: Numerous enhancing right breast masses and right axillary lymph nodes, nonspecific small pulmonary nodules,bone scan: No bone metastases URCC nausea study, ------------------------------------------------------------------------------------------------------------------------------------ Current treatment:Cycle 4 TCHP started10/02/2021 (Dignicap) Echocardiogram 07/07/2021: EF 55 to 60% Chemo toxicities: 1. Sores on the lips: Resolved 2. Fatigue.  This is improved from previous weeks. 3. Mild nausea 4. Diarrhea.  Managed well with Lomotil.  Jenna Cross is due for her echocardiogram sometime this month.  I went ahead and placed orders for this to be scheduled.  Since she is at cycle 4 of treatment I also went ahead and ordered her breast MRI so that it can occur following cycle 6 of her neoadjuvant chemotherapy.  Clinically her breast mass is difficult to feel.  I reviewed that a lot of times chemotherapy will attack the cancer and poke holes in it  like Swiss cheese making it feel softer as the cancer melts away. Return to clinic in 3 weeks for cycle 5

## 2021-09-13 NOTE — Progress Notes (Signed)
[facility] Follow up:    Jenna Cross, [address] [place] [address]   DIAGNOSIS:  Cancer Staging  Malignant neoplasm of upper-outer quadrant of right breast in female, estrogen receptor positive ([facility]) Staging form: Breast, AJCC 8th Edition - Clinical: Stage IB (cT2, cN1, cM0, G2, ER+, PR+, HER2+) - Unsigned Histologic grading system: 3 grade system   SUMMARY OF ONCOLOGIC HISTORY: Oncology History  Malignant neoplasm of upper-outer quadrant of right breast in female, estrogen receptor positive ([facility])  06/23/2021 Initial Diagnosis   Palpable lump in the right breast. Diagnostic mammogram and [place]: a 3.8 cm mass at the retroareolar right breast, 1 prominent right axillary lymph node with a 4 mm cortex, 1 other which is borderline measuring 3 mm. 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  3 o'clock position: Biopsy fibroadenoma   07/12/2021 -  Neo-Adjuvant Chemotherapy   Neoadjuvant chemotherapy with TCH Perjeta 6 cycles followed by Herceptin Perjeta maintenance versus Kadcyla maintenance (based on response to neoadjuvant chemo) for 1 year   07/28/2021 Genetic Testing   Negative hereditary cancer genetic testing: no pathogenic variants detected in [facility] CancerNext-Expanded +RNAinsight Panel.  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  The report date is July 28, 2021.   The CancerNext-Expanded gene panel offered by [facility] and includes sequencing, rearrangement, and RNA analysis for the following 77 genes: AIP, ALK, APC, ATM, AXIN2, BAP1, BARD1, BLM, BMPR1A, BRCA1, BRCA2, BRIP1, CDC73, CDH1, CDK4, CDKN1B, CDKN2A, CHEK2, CTNNA1, DICER1, FANCC, FH, FLCN, GALNT12, KIF1B, LZTR1, MAX, MEN1, MET, MLH1, MSH2, MSH3, MSH6, MUTYH, NBN, NF1, NF2, NTHL1, PALB2, PHOX2B, PMS2, POT1, PRKAR1A, PTCH1, PTEN, RAD51C, RAD51D, RB1, RECQL, RET, SDHA,  SDHAF2, SDHB, SDHC, SDHD, SMAD4, SMARCA4, SMARCB1, SMARCE1, STK11, SUFU, TMEM127, TP53, TSC1, TSC2, VHL and XRCC2 (sequencing and deletion/duplication); EGFR, EGLN1, HOXB13, KIT, MITF, PDGFRA, POLD1, and POLE (sequencing only); EPCAM and GREM1 (deletion/duplication only).     Surgery   Bilateral Mastectomies with sentinel lymph node biopsy planned following chemotherapy    Radiation Therapy   Following surgery    Anti-estrogen oral therapy   To follow radiation     CURRENT THERAPY: TCHP; zoladex   INTERVAL HISTORY: Jenna Cross 35 y.o. female returns for evaluation prior to receiving her next cycle of neoadjuvant chemotherapy.  She is tolerating treatment well.  Her most recent echocardiogram was completed on July 07, 2021.  It showed a normal left ventricular ejection fraction of 55 to 60% with a normal global longitudinal strain.  She notes that she is tolerating her treatment.  She does have a period of fatigue following her chemotherapy administration, however, this improves after several days.  She notes some diarrhea following chemotherapy.  This is managed with Lomotil.  She does endorse some taste changes.  She says when these are at their worst she will drink a smoothie.  She denies any peripheral neuropathy.  She denies any skin peeling on the palms of her hands or the soles of her feet.  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   Patient Active Problem List   Diagnosis Date Noted   Genetic testing 08/01/2021   Family history of prostate cancer 07/17/2021   Malignant neoplasm of upper-outer quadrant of right breast in female, estrogen receptor positive ([facility]) 06/27/2021   Breast tenderness in female 05/26/2021   Breast mass 05/26/2021   History of preterm premature rupture of membranes (PPROM)  01/31/2021   Preterm premature rupture of membranes (PPROM) with unknown onset of labor 01/27/2021   Vacuum extractor delivery, delivered 01/04/2020   Pregnancy  01/03/2020   Encounter for counseling 08/29/2018   Paresthesia 04/05/2016    has No Known Allergies.  MEDICAL HISTORY: Past Medical History:  Diagnosis Date   Anemia    Anxiety    Anxiety    no medication   Cancer ([place] [facility])    Family history of prostate cancer 07/17/2021    SURGICAL HISTORY: Past Surgical History:  Procedure Laterality Date   BREAST SURGERY     cyst removal from right breast    CESAREAN SECTION N/A 01/31/2021   Procedure: CESAREAN SECTION;  Surgeon: Jenna Queen, MD;  Location: [facility];  Service: Obstetrics;  Laterality: N/A;   excision of fibroadenoma of breast     PORTACATH PLACEMENT N/A 07/11/2021   Procedure: INSERTION PORT-A-CATH;  Surgeon: Rolm Bookbinder, MD;  Location: [facility];  Service: General;  Laterality: N/A;   TONSILLECTOMY     and adenoids   TONSILLECTOMY AND ADENOIDECTOMY     WISDOM TOOTH EXTRACTION      SOCIAL HISTORY: Social History   Socioeconomic History   Marital status: Married    Spouse name: Not on file   Number of children: 0   Years of education: College   Highest education level: Not on file  Occupational History   Not on file  Tobacco Use   Smoking status: Former   Smokeless tobacco: Never  Vaping Use   Vaping Use: Never used  Substance and Sexual Activity   Alcohol use: Not Currently   Drug use: Never   Sexual activity: Yes    Birth control/protection: None  Other Topics Concern   Not on file  Social History Narrative   ** Merged History Encounter **       Lives at home Right handed Drinks 1 cup of coffee daily and a couple of sodas per month   Social Determinants of Health   Financial Resource Strain: Not on file  Food Insecurity: Not on file  Transportation Needs: Not on file  Physical Activity: Not on file  Stress: Not on file  Social Connections: Not on file  Intimate Partner Violence: Not on file    FAMILY HISTORY: Family History  Problem Relation Age of Onset    Migraines Mother    Lymphoma Paternal Aunt        dx late 68s   Prostate cancer Maternal Grandfather        dx after 50    Review of Systems  Constitutional:  Positive for fatigue. Negative for appetite change, chills, fever and unexpected weight change.  HENT:   Negative for hearing loss, lump/mass and trouble swallowing.   Eyes:  Negative for eye problems and icterus.  Respiratory:  Negative for chest tightness, cough and shortness of breath.   Cardiovascular:  Negative for chest pain, leg swelling and palpitations.  Gastrointestinal:  Positive for diarrhea. Negative for abdominal distention, abdominal pain, constipation, nausea and vomiting.  Endocrine: Negative for hot flashes.  Genitourinary:  Negative for difficulty urinating.   Musculoskeletal:  Negative for arthralgias.  Skin:  Negative for itching and rash.  Neurological:  Negative for dizziness, extremity weakness, headaches and numbness.  Hematological:  Negative for adenopathy. Does not bruise/bleed easily.  Psychiatric/Behavioral:  Negative for depression. The patient is not nervous/anxious.      PHYSICAL EXAMINATION  ECOG PERFORMANCE STATUS: 1 - Symptomatic but completely  ambulatory  Vitals:   09/14/21 0910  BP: (!) 109/56  Pulse: 100  Resp: 18  Temp: 97.9 F (36.6 C)  SpO2: 100%    Physical Exam Constitutional:      General: She is not in acute distress.    Appearance: Normal appearance. She is not toxic-appearing.  HENT:     Head: Normocephalic and atraumatic.     Mouth/Throat:     Mouth: Mucous membranes are moist.     Pharynx: No oropharyngeal exudate.  Eyes:     General: No scleral icterus. Cardiovascular:     Rate and Rhythm: Normal rate and regular rhythm.     Pulses: Normal pulses.     Heart sounds: Normal heart sounds.  Pulmonary:     Effort: Pulmonary effort is normal.     Breath sounds: Normal breath sounds.  Chest:     Comments: Difficult to palpate right breast mass. Abdominal:      General: Abdomen is flat. Bowel sounds are normal. There is no distension.     Palpations: Abdomen is soft.     Tenderness: There is no abdominal tenderness.  Musculoskeletal:        General: No swelling.     Cervical back: Neck supple.  Lymphadenopathy:     Cervical: No cervical adenopathy.  Skin:    General: Skin is warm and dry.     Findings: No rash.  Neurological:     General: No focal deficit present.     Mental Status: She is alert.  Psychiatric:        Mood and Affect: Mood normal.        Behavior: Behavior normal.    LABORATORY DATA:  CBC    Component Value Date/Time   WBC 5.4 09/14/2021 0852   RBC 3.16 (L) 09/14/2021 0852   HGB 10.7 (L) 09/14/2021 0852   HGB 14.5 07/17/2021 0945   HCT 31.3 (L) 09/14/2021 0852   PLT 213 09/14/2021 0852   PLT 154 07/17/2021 0945   MCV 99.1 09/14/2021 0852   MCH 33.9 09/14/2021 0852   MCHC 34.2 09/14/2021 0852   RDW 16.8 (H) 09/14/2021 0852   LYMPHSABS 1.7 09/14/2021 0852   MONOABS 0.4 09/14/2021 0852   EOSABS 0.0 09/14/2021 0852   BASOSABS 0.1 09/14/2021 0852    CMP     Component Value Date/Time   NA 142 09/14/2021 0852   NA 140 04/05/2016 0932   K 4.2 09/14/2021 0852   CL 112 (H) 09/14/2021 0852   CO2 23 09/14/2021 0852   GLUCOSE 93 09/14/2021 0852   BUN 11 09/14/2021 0852   BUN 12 04/05/2016 0932   CREATININE 0.71 09/14/2021 0852   CREATININE 0.70 07/19/2021 1126   CALCIUM 8.2 (L) 09/14/2021 0852   PROT 6.1 (L) 09/14/2021 0852   PROT 7.2 04/05/2016 0932   ALBUMIN 3.6 09/14/2021 0852   ALBUMIN 4.4 04/05/2016 0932   AST 13 (L) 09/14/2021 0852   AST 27 07/19/2021 1126   ALT 14 09/14/2021 0852   ALT 28 07/19/2021 1126   ALKPHOS 106 09/14/2021 0852   BILITOT 0.3 09/14/2021 0852   BILITOT 0.8 07/19/2021 1126   GFRNONAA >60 09/14/2021 0852   GFRNONAA >60 07/19/2021 1126   GFRAA 109 04/05/2016 0932         ASSESSMENT and THERAPY PLAN:   Malignant neoplasm of upper-outer quadrant of right breast in  female, estrogen receptor positive ([facility]) Palpable lump in the right breast. Diagnostic mammogram and [place]: a 3.8 cm  mass at the retroareolar right breast, 1 prominent right axillary lymph node with a 4 mm cortex, 1 other which is borderline measuring 3 mm. 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  3 o'clock position: Biopsy fibroadenoma   Treatment plan: 1. Neoadjuvant chemotherapy with TCH Perjeta 6 cycles followed by Herceptin Perjeta maintenance versus Kadcyla maintenance (based on response to neoadjuvant chemo) for 1 year 2. Followed by bilateral mastectomies with sentinel lymph node study 3. Followed by adjuvant radiation therapy  4.  Followed by antiestrogen therapy 5.  Followed by neratinib   Breast MRI 06/28/2021: Confluent mass right breast 11.1 x 7.8 x 9.5 cm, 1 enlarged lymph node and second borderline lymph node in the right axilla Brain MRI 07/03/2021: No evidence of malignancy. CT CAP and bone scan: 07/10/2021: Numerous enhancing right breast masses and right axillary lymph nodes, nonspecific small pulmonary nodules, bone scan: No bone metastases URCC nausea study, ------------------------------------------------------------------------------------------------------------------------------------ Current treatment: Cycle 4 TCHP started 07/12/2021 (Dignicap) Echocardiogram 07/07/2021: EF 55 to 60% Chemo toxicities: Sores on the lips: Resolved Fatigue.  This is improved from previous weeks. Mild nausea Diarrhea.  Managed well with Lomotil.  Jenna Cross is due for her echocardiogram sometime this month.  I went ahead and placed orders for this to be scheduled.  Since she is at cycle 4 of treatment I also went ahead and ordered her breast MRI so that it can occur following cycle 6 of her neoadjuvant chemotherapy.  Clinically her breast mass is difficult to feel.  I reviewed that a lot of times chemotherapy will attack  the cancer and poke holes in it like Swiss cheese making it feel softer as the cancer melts away. Return to clinic in 3 weeks for cycle 5   Orders Placed This Encounter  Procedures   MR BREAST BILATERAL W [facility] CAD    Schedule 1/20    Standing Status:   Future    Standing Expiration Date:   09/14/2022    Order Specific Question:   If indicated for the ordered procedure, I authorize the administration of contrast media per Radiology protocol    Answer:   Yes    Order Specific Question:   What is the patient's sedation requirement?    Answer:   No Sedation    Order Specific Question:   Does the patient have a pacemaker or implanted devices?    Answer:   No    Order Specific Question:   Radiology Contrast Protocol - do NOT remove file path    Answer:   [email or web address]    Order Specific Question:   Preferred imaging location?    Answer:   [facility] (table limit-550lbs)   ECHOCARDIOGRAM COMPLETE    Standing Status:   Future    Standing Expiration Date:   09/14/2022    Order Specific Question:   Where should this test be performed    Answer:   [facility]    Order Specific Question:   Perflutren DEFINITY (image enhancing agent) should be administered unless hypersensitivity or allergy exist    Answer:   Administer Perflutren    Order Specific Question:   Reason for exam-Echo    Answer:   Chemo  Z09    All questions were answered. The patient knows to call the clinic with any problems, questions or concerns. We can certainly see the patient much sooner if necessary. Total encounter time: 30 minutes in face-to-face visit time, chart review, lab  review, care coordination, order entry, and documentation of the encounter.  Wilber Bihari, NP 09/14/21 9:07 PM Medical Oncology and Hematology [facility] [place], [place] [address] Tel. [phone]    Fax. [phone]  *Total Encounter Time as defined  by the Centers for Medicare and Medicaid Services includes, in addition to the face-to-face time of a patient visit (documented in the note above) non-face-to-face time: obtaining and reviewing outside history, ordering and reviewing medications, tests or procedures, care coordination (communications with other health care professionals or caregivers) and documentation in the medical record.

## 2021-09-14 ENCOUNTER — Other Ambulatory Visit: Payer: Self-pay | Admitting: Obstetrics and Gynecology

## 2021-09-14 ENCOUNTER — Other Ambulatory Visit: Payer: Self-pay

## 2021-09-14 ENCOUNTER — Inpatient Hospital Stay: Payer: 59

## 2021-09-14 ENCOUNTER — Encounter: Payer: Self-pay | Admitting: *Deleted

## 2021-09-14 ENCOUNTER — Inpatient Hospital Stay: Payer: 59 | Admitting: Adult Health

## 2021-09-14 ENCOUNTER — Telehealth: Payer: Self-pay | Admitting: Radiation Oncology

## 2021-09-14 ENCOUNTER — Encounter: Payer: Self-pay | Admitting: Adult Health

## 2021-09-14 VITALS — BP 110/55 | HR 93

## 2021-09-14 VITALS — BP 109/56 | HR 100 | Temp 97.9°F | Resp 18 | Ht 64.0 in | Wt 140.6 lb

## 2021-09-14 DIAGNOSIS — Z170 Estrogen receptor positive status [ER+]: Secondary | ICD-10-CM | POA: Diagnosis not present

## 2021-09-14 DIAGNOSIS — C50411 Malignant neoplasm of upper-outer quadrant of right female breast: Secondary | ICD-10-CM | POA: Diagnosis not present

## 2021-09-14 DIAGNOSIS — Z95828 Presence of other vascular implants and grafts: Secondary | ICD-10-CM

## 2021-09-14 DIAGNOSIS — I427 Cardiomyopathy due to drug and external agent: Secondary | ICD-10-CM

## 2021-09-14 DIAGNOSIS — Z5111 Encounter for antineoplastic chemotherapy: Secondary | ICD-10-CM | POA: Diagnosis not present

## 2021-09-14 DIAGNOSIS — Z5181 Encounter for therapeutic drug level monitoring: Secondary | ICD-10-CM

## 2021-09-14 LAB — CBC WITH DIFFERENTIAL/PLATELET
Abs Immature Granulocytes: 0.03 10*3/uL (ref 0.00–0.07)
Basophils Absolute: 0.1 10*3/uL (ref 0.0–0.1)
Basophils Relative: 1 %
Eosinophils Absolute: 0 10*3/uL (ref 0.0–0.5)
Eosinophils Relative: 0 %
HCT: 31.3 % — ABNORMAL LOW (ref 36.0–46.0)
Hemoglobin: 10.7 g/dL — ABNORMAL LOW (ref 12.0–15.0)
Immature Granulocytes: 1 %
Lymphocytes Relative: 32 %
Lymphs Abs: 1.7 10*3/uL (ref 0.7–4.0)
MCH: 33.9 pg (ref 26.0–34.0)
MCHC: 34.2 g/dL (ref 30.0–36.0)
MCV: 99.1 fL (ref 80.0–100.0)
Monocytes Absolute: 0.4 10*3/uL (ref 0.1–1.0)
Monocytes Relative: 8 %
Neutro Abs: 3.1 10*3/uL (ref 1.7–7.7)
Neutrophils Relative %: 58 %
Platelets: 213 10*3/uL (ref 150–400)
RBC: 3.16 MIL/uL — ABNORMAL LOW (ref 3.87–5.11)
RDW: 16.8 % — ABNORMAL HIGH (ref 11.5–15.5)
WBC: 5.4 10*3/uL (ref 4.0–10.5)
nRBC: 0 % (ref 0.0–0.2)

## 2021-09-14 LAB — COMPREHENSIVE METABOLIC PANEL
ALT: 14 U/L (ref 0–44)
AST: 13 U/L — ABNORMAL LOW (ref 15–41)
Albumin: 3.6 g/dL (ref 3.5–5.0)
Alkaline Phosphatase: 106 U/L (ref 38–126)
Anion gap: 7 (ref 5–15)
BUN: 11 mg/dL (ref 6–20)
CO2: 23 mmol/L (ref 22–32)
Calcium: 8.2 mg/dL — ABNORMAL LOW (ref 8.9–10.3)
Chloride: 112 mmol/L — ABNORMAL HIGH (ref 98–111)
Creatinine, Ser: 0.71 mg/dL (ref 0.44–1.00)
GFR, Estimated: >60 mL/min (ref >60)
Glucose, Bld: 93 mg/dL (ref 70–99)
Potassium: 4.2 mmol/L (ref 3.5–5.1)
Sodium: 142 mmol/L (ref 135–145)
Total Bilirubin: 0.3 mg/dL (ref 0.3–1.2)
Total Protein: 6.1 g/dL — ABNORMAL LOW (ref 6.5–8.1)

## 2021-09-14 LAB — PREGNANCY, URINE: Preg Test, Ur: NEGATIVE

## 2021-09-14 MED ORDER — SODIUM CHLORIDE 0.9% FLUSH
10.0000 mL | INTRAVENOUS | Status: DC | PRN
  Administered 2021-09-14: 10 mL

## 2021-09-14 MED ORDER — SODIUM CHLORIDE 0.9 % IV SOLN: Freq: Once | INTRAVENOUS | Status: AC

## 2021-09-14 MED ORDER — SODIUM CHLORIDE 0.9 % IV SOLN
150.0000 mg | Freq: Once | INTRAVENOUS | Status: AC
  Administered 2021-09-14: 150 mg via INTRAVENOUS
  Filled 2021-09-14: qty 150

## 2021-09-14 MED ORDER — TRASTUZUMAB-ANNS CHEMO 150 MG IV SOLR
6.0000 mg/kg | Freq: Once | INTRAVENOUS | Status: AC
  Administered 2021-09-14: 378 mg via INTRAVENOUS
  Filled 2021-09-14: qty 18

## 2021-09-14 MED ORDER — HEPARIN SOD (PORK) LOCK FLUSH 100 UNIT/ML IV SOLN
500.0000 [IU] | Freq: Once | INTRAVENOUS | Status: AC | PRN
  Administered 2021-09-14: 500 [IU]

## 2021-09-14 MED ORDER — SODIUM CHLORIDE 0.9 % IV SOLN
700.0000 mg | Freq: Once | INTRAVENOUS | Status: AC
  Administered 2021-09-14: 700 mg via INTRAVENOUS
  Filled 2021-09-14: qty 70

## 2021-09-14 MED ORDER — SODIUM CHLORIDE 0.9 % IV SOLN
75.0000 mg/m2 | Freq: Once | INTRAVENOUS | Status: AC
  Administered 2021-09-14: 130 mg via INTRAVENOUS
  Filled 2021-09-14: qty 13

## 2021-09-14 MED ORDER — PALONOSETRON HCL INJECTION 0.25 MG/5ML
0.2500 mg | Freq: Once | INTRAVENOUS | Status: AC
  Administered 2021-09-14: 0.25 mg via INTRAVENOUS
  Filled 2021-09-14: qty 5

## 2021-09-14 MED ORDER — SODIUM CHLORIDE 0.9% FLUSH
10.0000 mL | INTRAVENOUS | Status: DC | PRN
  Administered 2021-09-14: 10 mL via INTRAVENOUS

## 2021-09-14 MED ORDER — SODIUM CHLORIDE 0.9 % IV SOLN
420.0000 mg | Freq: Once | INTRAVENOUS | Status: AC
  Administered 2021-09-14: 420 mg via INTRAVENOUS
  Filled 2021-09-14: qty 14

## 2021-09-14 MED ORDER — DEXAMETHASONE SODIUM PHOSPHATE 100 MG/10ML IJ SOLN
10.0000 mg | Freq: Once | INTRAMUSCULAR | Status: AC
  Administered 2021-09-14: 10 mg via INTRAVENOUS
  Filled 2021-09-14: qty 10

## 2021-09-14 MED ORDER — DIPHENHYDRAMINE HCL 25 MG PO CAPS
25.0000 mg | ORAL_CAPSULE | Freq: Once | ORAL | Status: AC
  Administered 2021-09-14: 25 mg via ORAL
  Filled 2021-09-14: qty 1

## 2021-09-14 MED ORDER — ACETAMINOPHEN 325 MG PO TABS
650.0000 mg | ORAL_TABLET | Freq: Once | ORAL | Status: AC
  Administered 2021-09-14: 650 mg via ORAL
  Filled 2021-09-14: qty 2

## 2021-09-14 NOTE — Patient Instructions (Signed)
[facility]  Discharge Instructions: Thank you for choosing [facility] to provide your oncology and hematology care.   If you have a lab appointment with the [facility], please go directly to the [facility] and check in at the registration area.   Wear comfortable clothing and clothing appropriate for easy access to any Portacath or PICC line.   We strive to give you quality time with your provider. You may need to reschedule your appointment if you arrive late (15 or more minutes).  Arriving late affects you and other patients whose appointments are after yours.  Also, if you miss three or more appointments without notifying the office, you may be dismissed from the clinic at the provider's discretion.      For prescription refill requests, have your pharmacy contact our office and allow 72 hours for refills to be completed.    Today you received the following chemotherapy and/or immunotherapy agents: Trastuzumab (Kanjinti), Pertuzumab (Perjeta), Docetaxel (Taxotere), and Carboplatin.   To help prevent nausea and vomiting after your treatment, we encourage you to take your nausea medication as directed.  BELOW ARE SYMPTOMS THAT SHOULD BE REPORTED IMMEDIATELY: *FEVER GREATER THAN 100.4 F (38 C) OR HIGHER *CHILLS OR SWEATING *NAUSEA AND VOMITING THAT IS NOT CONTROLLED WITH YOUR NAUSEA MEDICATION *UNUSUAL SHORTNESS OF BREATH *UNUSUAL BRUISING OR BLEEDING *URINARY PROBLEMS (pain or burning when urinating, or frequent urination) *BOWEL PROBLEMS (unusual diarrhea, constipation, pain near the anus) TENDERNESS IN MOUTH AND THROAT WITH OR WITHOUT PRESENCE OF ULCERS (sore throat, sores in mouth, or a toothache) UNUSUAL RASH, SWELLING OR PAIN  UNUSUAL VAGINAL DISCHARGE OR ITCHING   Items with * indicate a potential emergency and should be followed up as soon as possible or go to the Emergency Department if any problems should occur.  Please show  the CHEMOTHERAPY ALERT CARD or IMMUNOTHERAPY ALERT CARD at check-in to the Emergency Department and triage nurse.  Should you have questions after your visit or need to cancel or reschedule your appointment, please contact [facility]  Dept: [phone]  and follow the prompts.  Office hours are 8:00 a.m. to 4:30 p.m. Monday - Friday. Please note that voicemails left after 4:00 p.m. may not be returned until the following business day.  We are closed weekends and major holidays. You have access to a nurse at all times for urgent questions. Please call the main number to the clinic Dept: [phone] and follow the prompts.   For any non-urgent questions, you may also contact your provider using MyChart. We now offer e-Visits for anyone 64 and older to request care online for non-urgent symptoms. For details visit [email or web address].   Also download the MyChart app! Go to the app store, search "MyChart", open the app, select [facility], and log in with your MyChart username and password.  Due to Covid, a mask is required upon entering the hospital/clinic. If you do not have a mask, one will be given to you upon arrival. For doctor visits, patients may have 1 support person aged 41 or older with them. For treatment visits, patients cannot have anyone with them due to current Covid guidelines and our immunocompromised population.

## 2021-09-15 ENCOUNTER — Ambulatory Visit: Payer: 59 | Admitting: Plastic Surgery

## 2021-09-15 ENCOUNTER — Telehealth: Payer: Self-pay | Admitting: Radiation Oncology

## 2021-09-15 VITALS — BP 116/66 | HR 79 | Ht 64.0 in | Wt 140.4 lb

## 2021-09-15 DIAGNOSIS — Z170 Estrogen receptor positive status [ER+]: Secondary | ICD-10-CM | POA: Diagnosis not present

## 2021-09-15 DIAGNOSIS — C50411 Malignant neoplasm of upper-outer quadrant of right female breast: Secondary | ICD-10-CM | POA: Diagnosis not present

## 2021-09-16 ENCOUNTER — Inpatient Hospital Stay: Payer: 59

## 2021-09-16 ENCOUNTER — Other Ambulatory Visit: Payer: Self-pay

## 2021-09-16 VITALS — BP 109/57 | HR 89 | Temp 97.3°F | Resp 20

## 2021-09-16 DIAGNOSIS — C50411 Malignant neoplasm of upper-outer quadrant of right female breast: Secondary | ICD-10-CM

## 2021-09-16 DIAGNOSIS — Z5111 Encounter for antineoplastic chemotherapy: Secondary | ICD-10-CM | POA: Diagnosis not present

## 2021-09-16 MED ORDER — PEGFILGRASTIM-BMEZ 6 MG/0.6ML ~~LOC~~ SOSY
6.0000 mg | PREFILLED_SYRINGE | Freq: Once | SUBCUTANEOUS | Status: AC
  Administered 2021-09-16: 6 mg via SUBCUTANEOUS
  Filled 2021-09-16: qty 0.6

## 2021-09-17 ENCOUNTER — Encounter: Payer: Self-pay | Admitting: Plastic Surgery

## 2021-09-17 NOTE — Progress Notes (Signed)
Patient ID: Jenna Cross, female    DOB: 06-24-1986, 35 y.o.   MRN: [id number]   Chief Complaint  Patient presents with   Advice Only   Breast Cancer    Patient is a 35 year old female here for evaluation and consultation for breast reconstruction.  The patient was seen in August.  She was noted to have some irregularities in her breast tissue and followed up on it.  In September she was found to have invasive ductal carcinoma of the right breast, upper outer quadrant.  She also has a right axillary lymph node that is positive.  It was 3.8 cm ultrasound and mammogram.  The tumor is estrogen and progesterone positive HER2 positive.  The left breast had a suspicious nodule but it was negative for cancer.  She is 5 feet 4 inches tall and weighs 140 pounds.  Her preoperative bra size is a 34 B/C.  She would like to be around the same size after surgery and reconstruction.  Her sternal notch to nipple distance on the right is 25 cm and 26 cm on the left.  She is not a smoker and not on any blood thinners.  She has decided to have bilateral mastectomies.  She currently has a Port-A-Cath in place and is getting neoadjuvant chemotherapy.  She is planning on having postop radiation.    Review of Systems  Constitutional: Negative.   HENT: Negative.    Eyes: Negative.   Respiratory: Negative.    Cardiovascular: Negative.  Negative for leg swelling.  Gastrointestinal: Negative.  Negative for abdominal distention.  Endocrine: Negative.   Genitourinary: Negative.   Musculoskeletal: Negative.   Skin: Negative.  Negative for wound.  Neurological: Negative.   Hematological: Negative.   Psychiatric/Behavioral: Negative.     Past Medical History:  Diagnosis Date   Anemia    Anxiety    Anxiety    no medication   Cancer ([place])    Family history of prostate cancer 07/17/2021    Past Surgical History:  Procedure Laterality Date   BREAST SURGERY     cyst removal from right breast     CESAREAN SECTION N/A 01/31/2021   Procedure: CESAREAN SECTION;  Surgeon: Dian Queen, MD;  Location: [facility];  Service: Obstetrics;  Laterality: N/A;   excision of fibroadenoma of breast     PORTACATH PLACEMENT N/A 07/11/2021   Procedure: INSERTION PORT-A-CATH;  Surgeon: Rolm Bookbinder, MD;  Location: [facility];  Service: General;  Laterality: N/A;   TONSILLECTOMY     and adenoids   TONSILLECTOMY AND ADENOIDECTOMY     WISDOM TOOTH EXTRACTION        Current Outpatient Medications:    acetaminophen (TYLENOL) 500 MG tablet, Take 1,000 mg by mouth every 6 (six) hours as needed for mild pain or headache., Disp: , Rfl:    cetirizine (ZYRTEC) 10 MG tablet, Take 10 mg by mouth at bedtime., Disp: , Rfl:    clindamycin (CLINDAGEL) 1 % gel, Apply topically 2 (two) times daily., Disp: 30 g, Rfl: 3   dexamethasone (DECADRON) 4 MG tablet, Take 1 tablet (4 mg total) by mouth daily. Take 1 tablet day before chemo and 1 tablet day after chemo with food, Disp: 12 tablet, Rfl: 0   diphenoxylate-atropine (LOMOTIL) 2.5-0.025 MG tablet, Take 1 tablet by mouth 3 (three) times daily as needed for diarrhea or loose stools., Disp: 30 tablet, Rfl: 3   escitalopram (LEXAPRO) 20 MG tablet, Take 20 mg by  mouth daily., Disp: , Rfl:    ferrous sulfate 325 (65 FE) MG EC tablet, Take 1 tablet (325 mg total) by mouth 2 (two) times daily., Disp: 60 tablet, Rfl: 2   ibuprofen (ADVIL) 600 MG tablet, Take 1 tablet (600 mg total) by mouth every 6 (six) hours as needed., Disp: 30 tablet, Rfl: 0   lidocaine-prilocaine (EMLA) cream, Apply to affected area once, Disp: 30 g, Rfl: 3   loratadine (CLARITIN) 10 MG tablet, Take 10 mg by mouth daily., Disp: , Rfl:    LORazepam (ATIVAN) 0.5 MG tablet, Take 1 tablet (0.5 mg total) by mouth at bedtime as needed (Nausea or vomiting)., Disp: 30 tablet, Rfl: 0   magic mouthwash w/lidocaine SOLN, Take 5 mLs by mouth 3 (three) times daily as needed for mouth pain., Disp: 100  mL, Rfl: 0   ondansetron (ZOFRAN) 8 MG tablet, Take 1 tablet (8 mg total) by mouth 2 (two) times daily as needed (Nausea or vomiting). Start on the third day after chemotherapy., Disp: 30 tablet, Rfl: 1   prochlorperazine (COMPAZINE) 10 MG tablet, Take 1 tablet (10 mg total) by mouth every 6 (six) hours as needed (Nausea or vomiting)., Disp: 30 tablet, Rfl: 1   traMADol (ULTRAM) 50 MG tablet, Take 2 tablets (100 mg total) by mouth every 6 (six) hours as needed., Disp: 10 tablet, Rfl: 0   valACYclovir (VALTREX) 500 MG tablet, Take 500 mg by mouth 2 (two) times daily., Disp: , Rfl:    Objective:   Vitals:   09/15/21 1458  BP: 116/66  Pulse: 79  SpO2: 96%    Physical Exam Vitals and nursing note reviewed.  Constitutional:      Appearance: Normal appearance.  HENT:     Head: Normocephalic and atraumatic.     Nose: Nose normal.  Eyes:     Pupils: Pupils are equal, round, and reactive to light.  Cardiovascular:     Rate and Rhythm: Normal rate.     Pulses: Normal pulses.  Pulmonary:     Effort: Pulmonary effort is normal. No respiratory distress.  Abdominal:     General: Abdomen is flat. There is no distension.     Palpations: Abdomen is soft. There is no mass.     Hernia: No hernia is present.  Skin:    Capillary Refill: Capillary refill takes less than 2 seconds.     Coloration: Skin is not jaundiced or pale.     Findings: No rash.  Neurological:     Mental Status: She is alert and oriented to person, place, and time.  Psychiatric:        Mood and Affect: Mood normal.        Behavior: Behavior normal.        Judgment: Judgment normal.    Assessment & Plan:  Malignant neoplasm of upper-outer quadrant of right breast in female, estrogen receptor positive ([place])  The options for reconstruction we explained to the patient / family for breast reconstruction.  There are two general categories of reconstruction.  We can reconstruction a breast with implants or use the patient's  own tissue.  These were further discussed as listed.  Breast reconstruction is an optional procedure and eligibility depends on the full spectrum of the health of the patient and any co-morbidities.  More than one surgery is often needed to complete the reconstruction process.  The process can take three to twelve months to complete.  The breasts will not be identical due to many  factors such as rib differences, shoulder asymmetry and treatments such as radiation.  The goal is to get the breasts to look normal and symmetrical in clothes.  Scars are a part of surgery and may fade some in time but will always be present under clothes.  Surgery may be an option on the non-cancer breast to achieve more symmetry.  No matter which procedure is chosen there is always the risk of complications and even failure of the body to heal.  This could result in no breast.    The options for reconstruction include:  1. Placement of a tissue expander with Acellular dermal matrix. When the expander is the desired size surgery is performed to remove the expander and place an implant.  In some cases the implant can be placed without an expander.  2. Autologous reconstruction can include using a muscle or tissue from another area of the body to create a breast.  3. Combined procedures (ie. latissismus dorsi flap) can be done with an expander / implant placed under the muscle.   The risks, benefits, scars and recovery time were discussed for each of the above. Risks include bleeding, infection, hematoma, seroma, scarring, pain, wound healing complications, flap loss, fat necrosis, capsular contracture, need for implant removal, donor site complications, bulge, hernia, umbilical necrosis, need for urgent reoperation, and need for dressing changes.   The procedure the patient selected / that was best for the patient, was then discussed in further detail.  Total time: 45 minutes. This includes time spent with the patient during the  visit as well as time spent before and after the visit reviewing the chart, documenting the encounter, making phone calls and reviewing studies.   The patient has a wonderful attitude and is wanting to get moving with the surgery as soon as she finishes the chemotherapy.  She is aware she will wait 3 to 4 weeks to make sure her blood counts are normalized.  She has opted for bilateral mastectomies and this is reasonable.  It is reasonable to try to salvage her nipple areolar complex.  Especially given that she is going to have postop radiation and will need to be expanded quickly.    Her abdomen is not large enough for 2B or C sized breasts.  Therefore she has decided on bilateral immediate breast reconstruction with expander and Flex HD.  We will expand her quickly and use a larger piece of Flex HD to help achieve this and.  She understands that there will be 2-3 operations for completion of the procedure.  She understands that once she has been radiated no surgery will be done for a minimum of 6 months after her last radiation treatment.  Plan bilateral immediate breast reconstruction with expander and Flex HD placement.  Pictures were obtained of the patient and placed in the chart with the patient's or guardian's permission.   [place], DO

## 2021-09-26 ENCOUNTER — Inpatient Hospital Stay (HOSPITAL_BASED_OUTPATIENT_CLINIC_OR_DEPARTMENT_OTHER): Payer: 59 | Admitting: Adult Health

## 2021-09-26 ENCOUNTER — Other Ambulatory Visit: Payer: Self-pay | Admitting: *Deleted

## 2021-09-26 ENCOUNTER — Ambulatory Visit (HOSPITAL_COMMUNITY)
Admission: RE | Admit: 2021-09-26 | Discharge: 2021-09-26 | Disposition: A | Discharge status: Home / Self Care | Payer: 59 | Source: Ambulatory Visit | Attending: Hematology and Oncology | Admitting: Hematology and Oncology

## 2021-09-26 ENCOUNTER — Other Ambulatory Visit: Payer: Self-pay

## 2021-09-26 ENCOUNTER — Telehealth: Payer: Self-pay | Admitting: *Deleted

## 2021-09-26 VITALS — BP 114/58 | HR 107 | Temp 98.1°F | Resp 18 | Ht 64.0 in | Wt 136.4 lb

## 2021-09-26 DIAGNOSIS — Z5111 Encounter for antineoplastic chemotherapy: Secondary | ICD-10-CM | POA: Diagnosis not present

## 2021-09-26 DIAGNOSIS — I82622 Acute embolism and thrombosis of deep veins of left upper extremity: Secondary | ICD-10-CM | POA: Diagnosis not present

## 2021-09-26 DIAGNOSIS — C50411 Malignant neoplasm of upper-outer quadrant of right female breast: Secondary | ICD-10-CM | POA: Diagnosis not present

## 2021-09-26 DIAGNOSIS — R0989 Other specified symptoms and signs involving the circulatory and respiratory systems: Secondary | ICD-10-CM

## 2021-09-26 DIAGNOSIS — I82409 Acute embolism and thrombosis of unspecified deep veins of unspecified lower extremity: Secondary | ICD-10-CM | POA: Insufficient documentation

## 2021-09-26 DIAGNOSIS — Z170 Estrogen receptor positive status [ER+]: Secondary | ICD-10-CM

## 2021-09-26 MED ORDER — RIVAROXABAN (XARELTO) VTE STARTER PACK (15 & 20 MG): ORAL_TABLET | ORAL | 0 refills | Status: DC

## 2021-09-26 NOTE — Assessment & Plan Note (Signed)
Palpable lump in the right breast. Diagnostic mammogram and [place]: a 3.8 cm mass at the retroareolar right breast, 1 prominent right axillary lymph node with a 4 mm cortex, 1 other which is borderline measuring 3 mm. 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 3 o'clock position: Biopsy fibroadenoma  Treatment plan: 1. Neoadjuvant chemotherapy with TCH Perjeta 6 cycles followed by Herceptin Perjeta maintenance versus Kadcyla maintenance (based on response to neoadjuvant chemo) for 1 year 2. Followed bybilateral mastectomieswith sentinel lymph node study 3. Followed by adjuvant radiation therapy 4.Followed by antiestrogen therapy 5.Followed by neratinib  Breast MRI 06/28/2021: Confluent mass right breast 11.1 x 7.8 x 9.5 cm, 1 enlarged lymph node and second borderline lymph node in the right axilla Brain MRI 07/03/2021: No evidence of malignancy. CT CAP and bone scan:07/10/2021: Numerous enhancing right breast masses and right axillary lymph nodes, nonspecific small pulmonary nodules,bone scan: No bone metastases URCC nausea study, ------------------------------------------------------------------------------------------------------------------------------------ Current treatment:CTCHP started10/02/2021 (Dignicap) Echocardiogram 07/07/2021: EF 55 to 60% Chemo toxicities: 1. Sores on the lips: Resolved 2. Fatigue.  This is improved from previous weeks. 3. Mild nausea 4. Diarrhea.  Managed well with Lomotil.  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  She is very concerned about this.  She met with Dr. Lindi Adie to discuss further due to her anxiety level.  I sent in a Xarelto starter pack for her to take she will take Xarelto 15 mg p.o. twice daily x3 weeks followed by Xarelto 20 mg daily.  I let her know that this will not delay her chemo and that we can continue her treatment through her port despite the blood clot.  She  will return as scheduled to receive her treatment.  She knows to call for any questions or concerns that may arise prior to her next treatment

## 2021-09-26 NOTE — Telephone Encounter (Signed)
Pt called with c/o tenderness to left neck at site of port catheter and some protruding. Orders placed for doppler to r/o DVT. Pt scheduled and notified of appt for 12/20 at 1pm.

## 2021-09-26 NOTE — Progress Notes (Signed)
[facility]:    Jenna Cross, [address] [place] [address]   DIAGNOSIS:  Cancer Staging  Malignant neoplasm of upper-outer quadrant of right breast in female, estrogen receptor positive ([place]) Staging form: Breast, AJCC 8th Edition - Clinical: Stage IB (cT2, cN1, cM0, G2, ER+, PR+, HER2+) - Unsigned Histologic grading system: 3 grade system   SUMMARY OF ONCOLOGIC HISTORY: Oncology History  Malignant neoplasm of upper-outer quadrant of right breast in female, estrogen receptor positive ([place])  06/23/2021 Initial Diagnosis   Palpable lump in the right breast. Diagnostic mammogram and [place]: a 3.8 cm mass at the retroareolar right breast, 1 prominent right axillary lymph node with a 4 mm cortex, 1 other which is borderline measuring 3 mm. 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  3 o'clock position: Biopsy fibroadenoma   07/12/2021 -  Neo-Adjuvant Chemotherapy   Neoadjuvant chemotherapy with TCH Perjeta 6 cycles followed by Herceptin Perjeta maintenance versus Kadcyla maintenance (based on response to neoadjuvant chemo) for 1 year   07/28/2021 Genetic Testing   Negative hereditary cancer genetic testing: no pathogenic variants detected in [facility] CancerNext-Expanded +RNAinsight Panel.  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  The report date is July 28, 2021.   The CancerNext-Expanded gene panel offered by [facility] and includes sequencing, rearrangement, and RNA analysis for the following 77 genes: AIP, ALK, APC, ATM, AXIN2, BAP1, BARD1, BLM, BMPR1A, BRCA1, BRCA2, BRIP1, CDC73, CDH1, CDK4, CDKN1B, CDKN2A, CHEK2, CTNNA1, DICER1, FANCC, FH, FLCN, GALNT12, KIF1B, LZTR1, MAX, MEN1, MET, MLH1, MSH2, MSH3, MSH6, MUTYH, NBN, NF1, NF2, NTHL1, PALB2, PHOX2B, PMS2, POT1, PRKAR1A, PTCH1, PTEN, RAD51C, RAD51D, RB1, RECQL, RET, SDHA,  SDHAF2, SDHB, SDHC, SDHD, SMAD4, SMARCA4, SMARCB1, SMARCE1, STK11, SUFU, TMEM127, TP53, TSC1, TSC2, VHL and XRCC2 (sequencing and deletion/duplication); EGFR, EGLN1, HOXB13, KIT, MITF, PDGFRA, POLD1, and POLE (sequencing only); EPCAM and GREM1 (deletion/duplication only).     Surgery   Bilateral Mastectomies with sentinel lymph node biopsy planned following chemotherapy    Radiation Therapy   Following surgery    Anti-estrogen oral therapy   To follow radiation     CURRENT THERAPY: Neoadjuvant chemotherapy  INTERVAL HISTORY: Jenna Cross 35 y.o. female returns for urgent evaluation after being in ultrasound and being told that she has a left IJ blood clot.  She is very scared about this because she heard the ultrasound tech say that it could travel to her lung or brain and cause problems for her and she is worried that she is going to die.   Patient Active Problem List   Diagnosis Date Noted   Deep venous thrombosis ([place]) 09/26/2021   Genetic testing 08/01/2021   Family history of prostate cancer 07/17/2021   Malignant neoplasm of upper-outer quadrant of right breast in female, estrogen receptor positive ([place]) 06/27/2021   Breast tenderness in female 05/26/2021   Breast mass 05/26/2021   History of preterm premature rupture of membranes (PPROM) 01/31/2021   Preterm premature rupture of membranes (PPROM) with unknown onset of labor 01/27/2021   Vacuum extractor delivery, delivered 01/04/2020   Pregnancy 01/03/2020   Encounter for counseling 08/29/2018   Paresthesia 04/05/2016    has No Known Allergies.  MEDICAL HISTORY: Past Medical History:  Diagnosis Date   Anemia    Anxiety    Anxiety    no medication   Cancer ([place])    Family history of prostate cancer 07/17/2021  SURGICAL HISTORY: Past Surgical History:  Procedure Laterality Date   BREAST SURGERY     cyst removal from right breast    CESAREAN SECTION N/A 01/31/2021   Procedure: CESAREAN SECTION;   Surgeon: Jenna Queen, MD;  Location: [facility];  Service: Obstetrics;  Laterality: N/A;   excision of fibroadenoma of breast     PORTACATH PLACEMENT N/A 07/11/2021   Procedure: INSERTION PORT-A-CATH;  Surgeon: Rolm Bookbinder, MD;  Location: [facility];  Service: General;  Laterality: N/A;   TONSILLECTOMY     and adenoids   TONSILLECTOMY AND ADENOIDECTOMY     WISDOM TOOTH EXTRACTION      SOCIAL HISTORY: Social History   Socioeconomic History   Marital status: Married    Spouse name: Not on file   Number of children: 0   Years of education: College   Highest education level: Not on file  Occupational History   Not on file  Tobacco Use   Smoking status: Former   Smokeless tobacco: Never  Vaping Use   Vaping Use: Never used  Substance and Sexual Activity   Alcohol use: Not Currently   Drug use: Never   Sexual activity: Yes    Birth control/protection: None  Other Topics Concern   Not on file  Social History Narrative   ** Merged History Encounter **       Lives at home Right handed Drinks 1 cup of coffee daily and a couple of sodas per month   Social Determinants of Health   Financial Resource Strain: Not on file  Food Insecurity: Not on file  Transportation Needs: Not on file  Physical Activity: Not on file  Stress: Not on file  Social Connections: Not on file  Intimate Partner Violence: Not on file    FAMILY HISTORY: Family History  Problem Relation Age of Onset   Migraines Mother    Lymphoma Paternal Aunt        dx late 48s   Prostate cancer Maternal Grandfather        dx after 50    Review of Systems  Constitutional:  Negative for appetite change, chills, fatigue, fever and unexpected weight change.  HENT:   Negative for hearing loss, lump/mass and trouble swallowing.   Eyes:  Negative for eye problems and icterus.  Respiratory:  Negative for chest tightness, cough and shortness of breath.   Cardiovascular:  Negative for chest  pain, leg swelling and palpitations.  Gastrointestinal:  Negative for abdominal distention, abdominal pain, constipation, diarrhea, nausea and vomiting.  Endocrine: Negative for hot flashes.  Genitourinary:  Negative for difficulty urinating.   Musculoskeletal:  Negative for arthralgias.  Skin:  Negative for itching and rash.  Neurological:  Negative for dizziness, extremity weakness, headaches and numbness.  Hematological:  Negative for adenopathy. Does not bruise/bleed easily.  Psychiatric/Behavioral:  Negative for depression. The patient is nervous/anxious.      PHYSICAL EXAMINATION  ECOG PERFORMANCE STATUS: 1 - Symptomatic but completely ambulatory  Vitals:   09/26/21 1409  BP: (!) 114/58  Pulse: (!) 107  Resp: 18  Temp: 98.1 F (36.7 C)  SpO2: 100%    I did not do a complete physical exam on the patient today because she is very tearful and scared.  Her left neck is swollen.  Her breathing is nonlabored.  LABORATORY DATA:  CBC    Component Value Date/Time   WBC 5.4 09/14/2021 0852   RBC 3.16 (L) 09/14/2021 0852   HGB 10.7 (L)  09/14/2021 0852   HGB 14.5 07/17/2021 0945   HCT 31.3 (L) 09/14/2021 0852   PLT 213 09/14/2021 0852   PLT 154 07/17/2021 0945   MCV 99.1 09/14/2021 0852   MCH 33.9 09/14/2021 0852   MCHC 34.2 09/14/2021 0852   RDW 16.8 (H) 09/14/2021 0852   LYMPHSABS 1.7 09/14/2021 0852   MONOABS 0.4 09/14/2021 0852   EOSABS 0.0 09/14/2021 0852   BASOSABS 0.1 09/14/2021 0852    CMP     Component Value Date/Time   NA 142 09/14/2021 0852   NA 140 04/05/2016 0932   K 4.2 09/14/2021 0852   CL 112 (H) 09/14/2021 0852   CO2 23 09/14/2021 0852   GLUCOSE 93 09/14/2021 0852   BUN 11 09/14/2021 0852   BUN 12 04/05/2016 0932   CREATININE 0.71 09/14/2021 0852   CREATININE 0.70 07/19/2021 1126   CALCIUM 8.2 (L) 09/14/2021 0852   PROT 6.1 (L) 09/14/2021 0852   PROT 7.2 04/05/2016 0932   ALBUMIN 3.6 09/14/2021 0852   ALBUMIN 4.4 04/05/2016 0932   AST 13  (L) 09/14/2021 0852   AST 27 07/19/2021 1126   ALT 14 09/14/2021 0852   ALT 28 07/19/2021 1126   ALKPHOS 106 09/14/2021 0852   BILITOT 0.3 09/14/2021 0852   BILITOT 0.8 07/19/2021 1126   GFRNONAA >60 09/14/2021 0852   GFRNONAA >60 07/19/2021 1126   GFRAA 109 04/05/2016 0932       PENDING LABS:   RADIOGRAPHIC STUDIES:  VAS [place] UPPER EXTREMITY VENOUS DUPLEX  Result Date: 09/26/2021 UPPER VENOUS STUDY  Patient Name:  Jenna Cross  Date of Exam:   09/26/2021 Medical Rec #: [id number]           Accession #:    [id number] Date of Birth: Feb 21, 1986          Patient Gender: F Patient Age:   85 years Exam Location:  [facility] Procedure:      VAS [place] UPPER EXTREMITY VENOUS DUPLEX Referring Phys: Nicholas Lose --------------------------------------------------------------------------------  Indications: Pain Risk Factors: Cancer. Comparison Study: No prior studies. Performing Technologist: Oliver Hum RVT  Examination Guidelines: A complete evaluation includes B-mode imaging, spectral Doppler, color Doppler, and power Doppler as needed of all accessible portions of each vessel. Bilateral testing is considered an integral part of a complete examination. Limited examinations for reoccurring indications may be performed as noted.  Right Findings: +----------+------------+---------+-----------+----------+-------+  RIGHT      Compressible Phasicity Spontaneous Properties Summary  +----------+------------+---------+-----------+----------+-------+  Subclavian     Full        Yes        Yes                         +----------+------------+---------+-----------+----------+-------+  Left Findings: +----------+------------+---------+-----------+----------+-------+  LEFT       Compressible Phasicity Spontaneous Properties Summary  +----------+------------+---------+-----------+----------+-------+  IJV            None        No         No                  Acute    +----------+------------+---------+-----------+----------+-------+  Subclavian     Full        Yes        Yes                         +----------+------------+---------+-----------+----------+-------+  Axillary  Full        Yes        Yes                         +----------+------------+---------+-----------+----------+-------+  Brachial       Full        Yes        Yes                         +----------+------------+---------+-----------+----------+-------+  Radial         Full                                               +----------+------------+---------+-----------+----------+-------+  Ulnar          Full                                               +----------+------------+---------+-----------+----------+-------+  Cephalic       Full                                               +----------+------------+---------+-----------+----------+-------+  Basilic        Full                                               +----------+------------+---------+-----------+----------+-------+  Summary:  Right: No evidence of thrombosis in the subclavian.  Left: No evidence of superficial vein thrombosis in the upper extremity. Findings consistent with acute deep vein thrombosis involving the left internal jugular vein.  *See table(s) above for measurements and observations.  Diagnosing physician: Deitra Mayo MD Electronically signed by Deitra Mayo MD on 09/26/2021 at 3:56:23 PM.    Final           ASSESSMENT and THERAPY PLAN:   Malignant neoplasm of upper-outer quadrant of right breast in female, estrogen receptor positive ([place]) Palpable lump in the right breast. Diagnostic mammogram and [place]: a 3.8 cm mass at the retroareolar right breast, 1 prominent right axillary lymph node with a 4 mm cortex, 1 other which is borderline measuring 3 mm. 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  3 o'clock position: Biopsy  fibroadenoma   Treatment plan: 1. Neoadjuvant chemotherapy with TCH Perjeta 6 cycles followed by Herceptin Perjeta maintenance versus Kadcyla maintenance (based on response to neoadjuvant chemo) for 1 year 2. Followed by bilateral mastectomies with sentinel lymph node study 3. Followed by adjuvant radiation therapy  4.  Followed by antiestrogen therapy 5.  Followed by neratinib   Breast MRI 06/28/2021: Confluent mass right breast 11.1 x 7.8 x 9.5 cm, 1 enlarged lymph node and second borderline lymph node in the right axilla Brain MRI 07/03/2021: No evidence of malignancy. CT CAP and bone scan: 07/10/2021: Numerous enhancing right breast masses and right axillary lymph nodes, nonspecific small pulmonary nodules, bone scan: No bone metastases URCC nausea study, ------------------------------------------------------------------------------------------------------------------------------------ Current treatment: CTCHP started  07/12/2021 (Dignicap) Echocardiogram 07/07/2021: EF 55 to 60% Chemo toxicities: Sores on the lips: Resolved Fatigue.  This is improved from previous weeks. Mild nausea Diarrhea.  Managed well with Lomotil.  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  She is very concerned about this.  She met with Dr. Lindi Adie to discuss further due to her anxiety level.  I sent in a Xarelto starter pack for her to take she will take Xarelto 15 mg p.o. twice daily x3 weeks followed by Xarelto 20 mg daily.  I let her know that this will not delay her chemo and that we can continue her treatment through her port despite the blood clot.  She will return as scheduled to receive her treatment.  She knows to call for any questions or concerns that may arise prior to her next treatment All questions were answered. The patient knows to call the clinic with any problems, questions or concerns. We can certainly see the patient much sooner if necessary.  Wilber Bihari, NP 09/26/21 3:58 PM Medical Oncology and  Hematology [facility] [place],  [address] Tel. [phone]    Fax. [phone]  Attending Note  I personally saw and examined Jenna Cross. The plan of care was discussed with her. I agree with the physical exam findings and assessment and plan as documented above. I performed the majority of the counseling and assessment and plan regarding this encounter New diagnosis of left internal jugular deep vein thrombosis: It is most likely related to the presence of IV line from the port.  She has no family history of blood clots.  We do not need to perform hypercoagulability work-up.  I reassured her that anticoagulation with Xarelto would resolve the blood clot over several months.  We intend to keep her on anticoagulation for 6 months.  Interestingly her husband also takes Xarelto for history of blood clots. Neoadjuvant chemotherapy for breast cancer completed 4 cycles.  Last cycle was tougher with more side effects including nausea and diarrhea that lasted for about 4 to 5 days.  We will monitor her closely. Signed Harriette Ohara, MD

## 2021-09-26 NOTE — Progress Notes (Signed)
Left upper extremity venous duplex has been completed. Preliminary results can be found in CV Proc through chart review.  Results were given to Mickel Baas at Dr. Geralyn Flash office.  09/26/21 1:27 PM Jenna Cross RVT

## 2021-09-27 ENCOUNTER — Other Ambulatory Visit: Payer: Self-pay

## 2021-09-27 ENCOUNTER — Ambulatory Visit (HOSPITAL_COMMUNITY)
Admission: RE | Admit: 2021-09-27 | Discharge: 2021-09-27 | Disposition: A | Discharge status: Home / Self Care | Payer: 59 | Source: Ambulatory Visit | Attending: Adult Health | Admitting: Adult Health

## 2021-09-27 DIAGNOSIS — Z5181 Encounter for therapeutic drug level monitoring: Secondary | ICD-10-CM | POA: Diagnosis not present

## 2021-09-27 DIAGNOSIS — Z0189 Encounter for other specified special examinations: Secondary | ICD-10-CM

## 2021-09-27 DIAGNOSIS — Z01818 Encounter for other preprocedural examination: Secondary | ICD-10-CM | POA: Insufficient documentation

## 2021-09-27 DIAGNOSIS — Z79899 Other long term (current) drug therapy: Secondary | ICD-10-CM | POA: Diagnosis not present

## 2021-09-27 LAB — ECHOCARDIOGRAM COMPLETE
AR max vel: 2.24 cm2
AV Peak grad: 6.5 mmHg
Ao pk vel: 1.27 m/s
Area-P 1/2: 4.86 cm2
Calc EF: 56.2 %
S' Lateral: 3.5 cm
Single Plane A2C EF: 58 %
Single Plane A4C EF: 55.3 %

## 2021-09-28 ENCOUNTER — Telehealth: Payer: Self-pay

## 2021-09-28 NOTE — Telephone Encounter (Signed)
Called and left a message asking her to call the office back. 

## 2021-09-28 NOTE — Telephone Encounter (Signed)
-----   Message from Gardenia Phlegm, NP sent at 09/28/2021  1:43 PM EST ----- Please call patient and see how she is doing on her Xarelto which is a new medication for her.  Please check on her symptoms from her DVT.  Also let her know that her echo was normal.  Thanks so much ----- Message ----- From: Interface, [employer] Sent: 09/27/2021  12:49 PM EST To: Gardenia Phlegm, NP

## 2021-09-28 NOTE — Telephone Encounter (Signed)
Called and left VM to assess pt's previous symptoms and how new medication is working for her.

## 2021-10-02 ENCOUNTER — Emergency Department (HOSPITAL_BASED_OUTPATIENT_CLINIC_OR_DEPARTMENT_OTHER)
Admit: 2021-10-02 | Discharge: 2021-10-02 | Disposition: A | Discharge status: Home / Self Care | Payer: 59 | Attending: Emergency Medicine | Admitting: Emergency Medicine

## 2021-10-02 ENCOUNTER — Emergency Department (HOSPITAL_COMMUNITY)
Admission: EM | Admit: 2021-10-02 | Discharge: 2021-10-02 | Disposition: A | Discharge status: Home / Self Care | Payer: 59 | Attending: Student | Admitting: Student

## 2021-10-02 ENCOUNTER — Encounter (HOSPITAL_COMMUNITY): Payer: Self-pay

## 2021-10-02 ENCOUNTER — Other Ambulatory Visit: Payer: Self-pay

## 2021-10-02 DIAGNOSIS — Z87891 Personal history of nicotine dependence: Secondary | ICD-10-CM | POA: Diagnosis not present

## 2021-10-02 DIAGNOSIS — M7989 Other specified soft tissue disorders: Secondary | ICD-10-CM | POA: Diagnosis not present

## 2021-10-02 DIAGNOSIS — R1310 Dysphagia, unspecified: Secondary | ICD-10-CM | POA: Insufficient documentation

## 2021-10-02 DIAGNOSIS — I82C12 Acute embolism and thrombosis of left internal jugular vein: Secondary | ICD-10-CM

## 2021-10-02 DIAGNOSIS — Z853 Personal history of malignant neoplasm of breast: Secondary | ICD-10-CM | POA: Insufficient documentation

## 2021-10-02 DIAGNOSIS — R221 Localized swelling, mass and lump, neck: Secondary | ICD-10-CM | POA: Diagnosis present

## 2021-10-02 LAB — CBC WITH DIFFERENTIAL/PLATELET
Abs Immature Granulocytes: 0.04 10*3/uL (ref 0.00–0.07)
Basophils Absolute: 0 10*3/uL (ref 0.0–0.1)
Basophils Relative: 0 %
Eosinophils Absolute: 0 10*3/uL (ref 0.0–0.5)
Eosinophils Relative: 0 %
HCT: 31.6 % — ABNORMAL LOW (ref 36.0–46.0)
Hemoglobin: 10.5 g/dL — ABNORMAL LOW (ref 12.0–15.0)
Immature Granulocytes: 0 %
Lymphocytes Relative: 16 %
Lymphs Abs: 1.6 10*3/uL (ref 0.7–4.0)
MCH: 34.4 pg — ABNORMAL HIGH (ref 26.0–34.0)
MCHC: 33.2 g/dL (ref 30.0–36.0)
MCV: 103.6 fL — ABNORMAL HIGH (ref 80.0–100.0)
Monocytes Absolute: 0.4 10*3/uL (ref 0.1–1.0)
Monocytes Relative: 5 %
Neutro Abs: 7.7 10*3/uL (ref 1.7–7.7)
Neutrophils Relative %: 79 %
Platelets: 171 10*3/uL (ref 150–400)
RBC: 3.05 MIL/uL — ABNORMAL LOW (ref 3.87–5.11)
RDW: 15.7 % — ABNORMAL HIGH (ref 11.5–15.5)
WBC: 9.8 10*3/uL (ref 4.0–10.5)
nRBC: 0 % (ref 0.0–0.2)

## 2021-10-02 LAB — BASIC METABOLIC PANEL
Anion gap: 6 (ref 5–15)
BUN: 8 mg/dL (ref 6–20)
CO2: 25 mmol/L (ref 22–32)
Calcium: 8.6 mg/dL — ABNORMAL LOW (ref 8.9–10.3)
Chloride: 106 mmol/L (ref 98–111)
Creatinine, Ser: 0.73 mg/dL (ref 0.44–1.00)
GFR, Estimated: >60 mL/min (ref >60)
Glucose, Bld: 104 mg/dL — ABNORMAL HIGH (ref 70–99)
Potassium: 3.4 mmol/L — ABNORMAL LOW (ref 3.5–5.1)
Sodium: 137 mmol/L (ref 135–145)

## 2021-10-02 NOTE — ED Provider Notes (Signed)
[facility] [facility] DEPT Provider Note   CSN: [id number] Arrival date & time: 10/02/21  1259     History Chief Complaint  Patient presents with   Neck Swelling    Jenna Cross is a 35 y.o. 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  Patient states that her pain is in the area of her diagnosed thrombus.  She has been compliant with her Eliquis.  She denies any chest pain, shortness of breath, abdominal pain nausea, vomiting, visual deficits or other systemic symptoms.  She states that she has mild decreased range of motion of the neck because it hurts to turn the neck.  She also endorses mild dysphagia.  HPI     Past Medical History:  Diagnosis Date   Anemia    Anxiety    Anxiety    no medication   Cancer ([place])    Family history of prostate cancer 07/17/2021    Patient Active Problem List   Diagnosis Date Noted   Deep venous thrombosis ([place]) 09/26/2021   Genetic testing 08/01/2021   Family history of prostate cancer 07/17/2021   Malignant neoplasm of upper-outer quadrant of right breast in female, estrogen receptor positive ([place]) 06/27/2021   Breast tenderness in female 05/26/2021   Breast mass 05/26/2021   History of preterm premature rupture of membranes (PPROM) 01/31/2021   Preterm premature rupture of membranes (PPROM) with unknown onset of labor 01/27/2021   Vacuum extractor delivery, delivered 01/04/2020   Pregnancy 01/03/2020   Encounter for counseling 08/29/2018   Paresthesia 04/05/2016    Past Surgical History:  Procedure Laterality Date   BREAST SURGERY     cyst removal from right breast    CESAREAN SECTION N/A 01/31/2021   Procedure: CESAREAN SECTION;  Surgeon: Dian Queen, MD;  Location: [facility];  Service: Obstetrics;  Laterality: N/A;   excision of  fibroadenoma of breast     PORTACATH PLACEMENT N/A 07/11/2021   Procedure: INSERTION PORT-A-CATH;  Surgeon: Rolm Bookbinder, MD;  Location: [facility];  Service: General;  Laterality: N/A;   TONSILLECTOMY     and adenoids   TONSILLECTOMY AND ADENOIDECTOMY     WISDOM TOOTH EXTRACTION       OB History     Gravida  2   Para  2   Term  1   Preterm  1   AB  0   Living  2      SAB  0   IAB  0   Ectopic  0   Multiple  0   Live Births  2           Family History  Problem Relation Age of Onset   Migraines Mother    Lymphoma Paternal Aunt        dx late 45s   Prostate cancer Maternal Grandfather        dx after 70    Social History   Tobacco Use   Smoking status: Former   Smokeless tobacco: Never  Scientific laboratory technician Use: Never used  Substance Use Topics   Alcohol use: Not Currently   Drug use: Never    Home Medications Prior to Admission medications   Medication Sig Start Date End Date Taking? Authorizing Provider  acetaminophen (TYLENOL) 500 MG tablet Take 1,000 mg by mouth every  6 (six) hours as needed for mild pain or headache.    [provider]  cetirizine (ZYRTEC) 10 MG tablet Take 10 mg by mouth at bedtime.    [provider]  clindamycin (CLINDAGEL) 1 % gel Apply topically 2 (two) times daily. 07/27/21   Nicholas Lose, MD  dexamethasone (DECADRON) 4 MG tablet Take 1 tablet (4 mg total) by mouth daily. Take 1 tablet day before chemo and 1 tablet day after chemo with food 06/29/21   Nicholas Lose, MD  diphenoxylate-atropine (LOMOTIL) 2.5-0.025 MG tablet Take 1 tablet by mouth 3 (three) times daily as needed for diarrhea or loose stools. 08/02/21   Nicholas Lose, MD  escitalopram (LEXAPRO) 20 MG tablet Take 20 mg by mouth daily.    [provider]  ferrous sulfate 325 (65 FE) MG EC tablet Take 1 tablet (325 mg total) by mouth 2 (two) times daily. 01/05/20   Tyson Dense, MD  ibuprofen (ADVIL) 600 MG  tablet Take 1 tablet (600 mg total) by mouth every 6 (six) hours as needed. 01/05/20   Tyson Dense, MD  lidocaine-prilocaine (EMLA) cream Apply to affected area once 06/29/21   Nicholas Lose, MD  loratadine (CLARITIN) 10 MG tablet Take 10 mg by mouth daily.    [provider]  LORazepam (ATIVAN) 0.5 MG tablet Take 1 tablet (0.5 mg total) by mouth at bedtime as needed (Nausea or vomiting). 06/29/21   Nicholas Lose, MD  magic mouthwash w/lidocaine SOLN Take 5 mLs by mouth 3 (three) times daily as needed for mouth pain. 07/19/21   Nicholas Lose, MD  ondansetron (ZOFRAN) 8 MG tablet Take 1 tablet (8 mg total) by mouth 2 (two) times daily as needed (Nausea or vomiting). Start on the third day after chemotherapy. 06/29/21   Nicholas Lose, MD  prochlorperazine (COMPAZINE) 10 MG tablet Take 1 tablet (10 mg total) by mouth every 6 (six) hours as needed (Nausea or vomiting). 06/29/21   Nicholas Lose, MD  RIVAROXABAN [employer]) VTE STARTER PACK (15 & 20 MG) Follow package directions: Take one 15mg  tablet by mouth twice a day. On day 22, switch to one 20mg  tablet once a day. Take with food. 09/26/21   Gardenia Phlegm, NP  traMADol (ULTRAM) 50 MG tablet Take 2 tablets (100 mg total) by mouth every 6 (six) hours as needed. 07/11/21   Rolm Bookbinder, MD  valACYclovir (VALTREX) 500 MG tablet Take 500 mg by mouth 2 (two) times daily.    [provider]    Allergies    Patient has no known allergies.  Review of Systems   Review of Systems  Constitutional:  Negative for chills and fever.  HENT:  Negative for ear pain and sore throat.   Eyes:  Negative for pain and visual disturbance.  Respiratory:  Negative for cough and shortness of breath.   Cardiovascular:  Negative for chest pain and palpitations.  Gastrointestinal:  Negative for abdominal pain and vomiting.  Genitourinary:  Negative for dysuria and hematuria.  Musculoskeletal:  Positive for neck pain. Negative for  arthralgias and back pain.  Skin:  Negative for color change and rash.  Neurological:  Negative for seizures and syncope.  All other systems reviewed and are negative.  Physical Exam Updated Vital Signs BP 121/71    Pulse (!) 102    Temp 98.2 F (36.8 C) (Oral)    Resp 18    SpO2 99%   Physical Exam  ED Results / Procedures / Treatments  Labs (all labs ordered are listed, but only abnormal results are displayed) Labs Reviewed  CBC WITH DIFFERENTIAL/PLATELET - Abnormal; Notable for the following components:      Result Value   RBC 3.05 (*)    Hemoglobin 10.5 (*)    HCT 31.6 (*)    MCV 103.6 (*)    MCH 34.4 (*)    RDW 15.7 (*)    All other components within normal limits  BASIC METABOLIC PANEL - Abnormal; Notable for the following components:   Potassium 3.4 (*)    Glucose, Bld 104 (*)    Calcium 8.6 (*)    All other components within normal limits    EKG None  Radiology UE Venous Duplex ([facility] and [facility] ONLY)  Result Date: 10/02/2021 UPPER VENOUS STUDY  Patient Name:  Jenna Cross  Date of Exam:   10/02/2021 Medical Rec #: [id number]           Accession #:    [id number] Date of Birth: 09-Mar-1986          Patient Gender: F Patient Age:   36 years Exam Location:  [facility] Procedure:      VAS [place] UPPER EXTREMITY VENOUS DUPLEX Referring Phys: Debbe Mounts --------------------------------------------------------------------------------  Indications: Known left IJV DVT, with increasing left neck tenderness and swelling Comparison Study: 09/26/2021- left upper extremity venous duplex with acute DVT                   left IJV Performing Technologist: Maudry Mayhew MHA, RDMS, RVT, RDCS  Examination Guidelines: A complete evaluation includes B-mode imaging, spectral Doppler, color Doppler, and power Doppler as needed of all accessible portions of each vessel. Bilateral testing is considered an integral part of a complete examination. Limited examinations for  reoccurring indications may be performed as noted.  Right Findings: +----------+------------+---------+-----------+----------+-------+  RIGHT      Compressible Phasicity Spontaneous Properties Summary  +----------+------------+---------+-----------+----------+-------+  Subclavian                 Yes        Yes                         +----------+------------+---------+-----------+----------+-------+  Left Findings: +----------+------------+---------+-----------+------------+-----------------+  LEFT       Compressible Phasicity Spontaneous  Properties       Summary       +----------+------------+---------+-----------+------------+-----------------+  IJV            None                   No      Heterogenous Age Indeterminate  +----------+------------+---------+-----------+------------+-----------------+  Subclavian     Full        Yes                                                +----------+------------+---------+-----------+------------+-----------------+  Axillary       Full        Yes                                                +----------+------------+---------+-----------+------------+-----------------+  Brachial       Full  Yes                                                +----------+------------+---------+-----------+------------+-----------------+  Radial         Full                                                           +----------+------------+---------+-----------+------------+-----------------+  Ulnar          Full                                                           +----------+------------+---------+-----------+------------+-----------------+  Cephalic       Full                                                           +----------+------------+---------+-----------+------------+-----------------+  Basilic        Full                                                           +----------+------------+---------+-----------+------------+-----------------+  Summary:  Right: No evidence  of thrombosis in the subclavian.  Left: Findings consistent with age indeterminate deep vein thrombosis involving the left internal jugular vein. When compared to prior study 09/26/2021, there is no extension of thrombus.  *See table(s) above for measurements and observations.    Preliminary     Procedures Procedures   Medications Ordered in ED Medications - No data to display  ED Course  I have reviewed the triage vital signs and the nursing notes.  Pertinent labs & imaging results that were available during my care of the patient were reviewed by me and considered in my medical decision making (see chart for details).    MDM Rules/Calculators/A&P                          Patient seen the emergency department for evaluation of left neck swelling and tenderness at the site of a known IJ thrombus.  Physical exam reveals a small palpable area of tenderness over the IJ on the left but is otherwise unremarkable.  Patient has no chest pain, shortness of breath and additional exam is unremarkable.  An ultrasound was obtained of the neck that shows no extension of the clot.  I spoke with the on-call oncologist who states that as the patient is only been on Xarelto for 6 days, he would not expect to see significant change in the clot size and if the patient wants to switch agents he would switch to Lovenox but the patient would be okay to continue on the Xarelto with  outpatient oncology follow-up.  I had a shared decision-making conversation with the patient who at this point would like to continue on the Xarelto and will take Tylenol for the pain.  Patient will follow up with oncology in 3 days.  Then discharged.   Final Clinical Impression(s) / ED Diagnoses Final diagnoses:  Neck swelling    Rx / DC Orders ED Discharge Orders     None        Marzella Miracle, MD 10/02/21 [phone]

## 2021-10-02 NOTE — ED Triage Notes (Signed)
Pt states she has a blood clot in her neck. Pt was placed on Xarelto, taking as prescribed, started it 12/20. Pt had a complication from her port, pt has breast cancer. Pt states her last cancer tx was 12/8. Pt c/o tenderness and swelling to her neck on left side.

## 2021-10-02 NOTE — ED Notes (Signed)
Pt in bed, pt denies arm pain in her L arm, pt c/o neck pain, states that she has a blood clot in her neck, sig other at bedside, resps even and unlabored, denies sob, blood drawn via 23 gauge butterfly from R ac.

## 2021-10-02 NOTE — Progress Notes (Signed)
Left upper extremity venous duplex completed. Refer to "CV Proc" under chart review to view preliminary results.  10/02/2021 4:00 PM Kelby Aline., MHA, RVT, RDCS, RDMS

## 2021-10-02 NOTE — ED Provider Notes (Signed)
Emergency Medicine Provider Triage Evaluation Note  Jenna Cross , a 35 y.o. female  was evaluated in triage.  Pt complains of increased swelling and tenderness to left side of her neck.  Reports that she had recent ultrasound which showed DVT to left IJ.  Patient has been started on Xarelto.  Patient is taking his medication as prescribed.  Patient is currently seen chemotherapy for right breast cancer.  Review of Systems  Positive: Swelling or tenderness to left side of her neck Negative: Fever, chills, trouble swallowing, trouble breathing  Physical Exam  BP 117/66    Pulse (!) 120    Temp 98.2 F (36.8 C) (Oral)    Resp 18    SpO2 100%  Gen:   Awake, no distress   Resp:  Normal effort  MSK:   Moves extremities without difficulty  Other:  Patient has swelling and tenderness to left side of her neck.  Handles oral secretions without difficulty.  Speaks in full complete sentences without difficulty.  Medical Decision Making  Medically screening exam initiated at 1:26 PM.  Appropriate orders placed.  Jenna Cross was informed that the remainder of the evaluation will be completed by another provider, this initial triage assessment does not replace that evaluation, and the importance of remaining in the ED until their evaluation is complete.  Repeat ultrasound imaging ordered   Loni Beckwith, PA-C 10/02/21 1328    Horton, Alvin Critchley, DO 10/03/21 1636

## 2021-10-02 NOTE — ED Notes (Signed)
Pt in bed, ultrasound at bedside. Pt has no requests at this time

## 2021-10-03 ENCOUNTER — Telehealth: Payer: Self-pay | Admitting: *Deleted

## 2021-10-03 NOTE — Telephone Encounter (Signed)
Patient lvm w/Dr.Gudena's office on 12/26 at 1155 am. Office closed at that time. Message retrieved 12/27 1015 am. Patient's message stated was seen by  Dr. Lindi Adie last week and neck still swollen in area of blood clot, taking Xarelto as prescribed. She was concerned that area was more swollen than previously. She stated nurse navigator advised her to contact MD office and ask to be seen.  Noted in patient chart that patient in [facility] ED  10/02/21  1259.  Contacted patient to inquire as to how she feels now and if she wants to be seen. LVM on named voice mail with this question and office cb number.

## 2021-10-04 ENCOUNTER — Encounter: Payer: Self-pay | Admitting: *Deleted

## 2021-10-04 NOTE — Progress Notes (Signed)
[facility] up:    Jenna Cross, [address] [place] [address]   DIAGNOSIS:  Cancer Staging  Malignant neoplasm of upper-outer quadrant of right breast in female, estrogen receptor positive ([place]) Staging form: Breast, AJCC 8th Edition - Clinical: Stage IB (cT2, cN1, cM0, G2, ER+, PR+, HER2+) - Unsigned Histologic grading system: 3 grade system   SUMMARY OF ONCOLOGIC HISTORY: Oncology History  Malignant neoplasm of upper-outer quadrant of right breast in female, estrogen receptor positive ([place])  06/23/2021 Initial Diagnosis   Palpable lump in the right breast. Diagnostic mammogram and [place]: a 3.8 cm mass at the retroareolar right breast, 1 prominent right axillary lymph node with a 4 mm cortex, 1 other which is borderline measuring 3 mm. 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  3 o'clock position: Biopsy fibroadenoma   07/12/2021 -  Neo-Adjuvant Chemotherapy   Neoadjuvant chemotherapy with TCH Perjeta 6 cycles followed by Herceptin Perjeta maintenance versus Kadcyla maintenance (based on response to neoadjuvant chemo) for 1 year   07/28/2021 Genetic Testing   Negative hereditary cancer genetic testing: no pathogenic variants detected in [facility] CancerNext-Expanded +RNAinsight Panel.  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  The report date is July 28, 2021.   The CancerNext-Expanded gene panel offered by [facility] and includes sequencing, rearrangement, and RNA analysis for the following 77 genes: AIP, ALK, APC, ATM, AXIN2, BAP1, BARD1, BLM, BMPR1A, BRCA1, BRCA2, BRIP1, CDC73, CDH1, CDK4, CDKN1B, CDKN2A, CHEK2, CTNNA1, DICER1, FANCC, FH, FLCN, GALNT12, KIF1B, LZTR1, MAX, MEN1, MET, MLH1, MSH2, MSH3, MSH6, MUTYH, NBN, NF1, NF2, NTHL1, PALB2, PHOX2B, PMS2, POT1, PRKAR1A, PTCH1, PTEN, RAD51C, RAD51D, RB1, RECQL, RET, SDHA,  SDHAF2, SDHB, SDHC, SDHD, SMAD4, SMARCA4, SMARCB1, SMARCE1, STK11, SUFU, TMEM127, TP53, TSC1, TSC2, VHL and XRCC2 (sequencing and deletion/duplication); EGFR, EGLN1, HOXB13, KIT, MITF, PDGFRA, POLD1, and POLE (sequencing only); EPCAM and GREM1 (deletion/duplication only).     Surgery   Bilateral Mastectomies with sentinel lymph node biopsy planned following chemotherapy    Radiation Therapy   Following surgery    Anti-estrogen oral therapy   To follow radiation     CURRENT THERAPY: Taxotere, Carboplatin, Herceptin, Perjeta  INTERVAL HISTORY: Jenna Cross 35 y.o. female returns for evaluation prior to receiving her fifth cycle of TCHP.  She is continuing to tolerate the treatment well.  She did have nausea and 1 episode of vomiting after this cycle.  Otherwise she noted no significant stomach issues.  She was diagnosed with a right IJ DVT last week.  We started her on Xarelto.  Since that time she did have an ER visit due to increased pain in her right neck.  They did a Doppler repeat at that time.  The DVT had not worsened and she stayed on Xarelto.  She notes that over the past few days the pain has improved and completely resolved.  She denies any easy bruising bleeding from the Xarelto and is taking it as prescribed.  She is noting some worsening fatigue.  Otherwise she is doing well.  She is requesting a refill on her lorazepam today.   Patient Active Problem List   Diagnosis Date Noted   Deep venous thrombosis ([place]) 09/26/2021   Genetic testing 08/01/2021   Family history of prostate cancer 07/17/2021   Malignant neoplasm of upper-outer quadrant of right breast in female, estrogen receptor positive (Jenna Cross) 06/27/2021   Breast tenderness in female 05/26/2021  Breast mass 05/26/2021   History of preterm premature rupture of membranes (PPROM) 01/31/2021   Preterm premature rupture of membranes (PPROM) with unknown onset of labor 01/27/2021   Vacuum extractor delivery,  delivered 01/04/2020   Pregnancy 01/03/2020   Encounter for counseling 08/29/2018   Paresthesia 04/05/2016    has No Known Allergies.  MEDICAL HISTORY: Past Medical History:  Diagnosis Date   Anemia    Anxiety    Anxiety    no medication   Cancer ([place])    Family history of prostate cancer 07/17/2021    SURGICAL HISTORY: Past Surgical History:  Procedure Laterality Date   BREAST SURGERY     cyst removal from right breast    CESAREAN SECTION N/A 01/31/2021   Procedure: CESAREAN SECTION;  Surgeon: Jenna Queen, MD;  Location: [facility];  Service: Obstetrics;  Laterality: N/A;   excision of fibroadenoma of breast     PORTACATH PLACEMENT N/A 07/11/2021   Procedure: INSERTION PORT-A-CATH;  Surgeon: Rolm Bookbinder, MD;  Location: [facility];  Service: General;  Laterality: N/A;   TONSILLECTOMY     and adenoids   TONSILLECTOMY AND ADENOIDECTOMY     WISDOM TOOTH EXTRACTION      SOCIAL HISTORY: Social History   Socioeconomic History   Marital status: Married    Spouse name: Not on file   Number of children: 0   Years of education: College   Highest education level: Not on file  Occupational History   Not on file  Tobacco Use   Smoking status: Former   Smokeless tobacco: Never  Vaping Use   Vaping Use: Never used  Substance and Sexual Activity   Alcohol use: Not Currently   Drug use: Never   Sexual activity: Yes    Birth control/protection: None  Other Topics Concern   Not on file  Social History Narrative   ** Merged History Encounter **       Lives at home Right handed Drinks 1 cup of coffee daily and a couple of sodas per month   Social Determinants of Health   Financial Resource Strain: Not on file  Food Insecurity: Not on file  Transportation Needs: Not on file  Physical Activity: Not on file  Stress: Not on file  Social Connections: Not on file  Intimate Partner Violence: Not on file    FAMILY HISTORY: Family History   Problem Relation Age of Onset   Migraines Mother    Lymphoma Paternal Aunt        dx late 75s   Prostate cancer Maternal Grandfather        dx after 50    Review of Systems  Constitutional:  Positive for fatigue. Negative for appetite change, chills, fever and unexpected weight change.  HENT:   Negative for hearing loss, lump/mass and trouble swallowing.   Eyes:  Negative for eye problems and icterus.  Respiratory:  Negative for chest tightness, cough and shortness of breath.   Cardiovascular:  Negative for chest pain, leg swelling and palpitations.  Gastrointestinal:  Negative for abdominal distention, abdominal pain, constipation, diarrhea, nausea and vomiting.  Endocrine: Negative for hot flashes.  Genitourinary:  Negative for difficulty urinating.   Musculoskeletal:  Negative for arthralgias.  Skin:  Negative for itching and rash.  Neurological:  Negative for dizziness, extremity weakness, headaches and numbness.  Hematological:  Negative for adenopathy. Does not bruise/bleed easily.  Psychiatric/Behavioral:  Negative for depression. The patient is not nervous/anxious.  PHYSICAL EXAMINATION  ECOG PERFORMANCE STATUS: 1 - Symptomatic but completely ambulatory  Vitals:   10/05/21 0847  BP: 112/63  Pulse: 96  Resp: 20  Temp: 97.8 F (36.6 C)  SpO2: 100%    Physical Exam Constitutional:      General: She is not in acute distress.    Appearance: Normal appearance. She is not toxic-appearing.  HENT:     Head: Normocephalic and atraumatic.  Eyes:     General: No scleral icterus. Cardiovascular:     Rate and Rhythm: Normal rate and regular rhythm.     Pulses: Normal pulses.     Heart sounds: Normal heart sounds.  Pulmonary:     Effort: Pulmonary effort is normal.     Breath sounds: Normal breath sounds.  Abdominal:     General: Abdomen is flat. Bowel sounds are normal. There is no distension.     Palpations: Abdomen is soft.     Tenderness: There is no  abdominal tenderness.  Musculoskeletal:        General: No swelling.     Cervical back: Neck supple.  Lymphadenopathy:     Cervical: No cervical adenopathy.  Skin:    General: Skin is warm and dry.     Findings: No rash.  Neurological:     General: No focal deficit present.     Mental Status: She is alert.  Psychiatric:        Mood and Affect: Mood normal.        Behavior: Behavior normal.    LABORATORY DATA:  CBC    Component Value Date/Time   WBC 6.2 10/05/2021 0835   RBC 3.00 (L) 10/05/2021 0835   HGB 10.1 (L) 10/05/2021 0835   HGB 14.5 07/17/2021 0945   HCT 30.5 (L) 10/05/2021 0835   PLT 254 10/05/2021 0835   PLT 154 07/17/2021 0945   MCV 101.7 (H) 10/05/2021 0835   MCH 33.7 10/05/2021 0835   MCHC 33.1 10/05/2021 0835   RDW 14.9 10/05/2021 0835   LYMPHSABS 0.8 10/05/2021 0835   MONOABS 0.1 10/05/2021 0835   EOSABS 0.0 10/05/2021 0835   BASOSABS 0.0 10/05/2021 0835    CMP     Component Value Date/Time   NA 140 10/05/2021 0835   NA 140 04/05/2016 0932   K 4.0 10/05/2021 0835   CL 108 10/05/2021 0835   CO2 25 10/05/2021 0835   GLUCOSE 122 (H) 10/05/2021 0835   BUN 8 10/05/2021 0835   BUN 12 04/05/2016 0932   CREATININE 0.58 10/05/2021 0835   CREATININE 0.70 07/19/2021 1126   CALCIUM 9.0 10/05/2021 0835   PROT 6.9 10/05/2021 0835   PROT 7.2 04/05/2016 0932   ALBUMIN 3.8 10/05/2021 0835   ALBUMIN 4.4 04/05/2016 0932   AST 11 (L) 10/05/2021 0835   AST 27 07/19/2021 1126   ALT 10 10/05/2021 0835   ALT 28 07/19/2021 1126   ALKPHOS 95 10/05/2021 0835   BILITOT 0.3 10/05/2021 0835   BILITOT 0.8 07/19/2021 1126   GFRNONAA >60 10/05/2021 0835   GFRNONAA >60 07/19/2021 1126   GFRAA 109 04/05/2016 0932        ASSESSMENT and THERAPY PLAN:   Malignant neoplasm of upper-outer quadrant of right breast in female, estrogen receptor positive ([facility]) Palpable lump in the right breast. Diagnostic mammogram and [place]: a 3.8 cm mass at the retroareolar right breast,  1 prominent right axillary lymph node with a 4 mm cortex, 1 other which is borderline measuring 3 mm. Biopsy: invasive  lobular carcinoma with metastatic carcinoma involving one lymph node, ER+(95%)/PR+(85%)/Her2+ (3+).  3 o'clock position: Biopsy fibroadenoma   Treatment plan: 1. Neoadjuvant chemotherapy with TCH Perjeta 6 cycles followed by Herceptin Perjeta maintenance versus Kadcyla maintenance (based on response to neoadjuvant chemo) for 1 year 2. Followed by bilateral mastectomies with sentinel lymph node study 3. Followed by adjuvant radiation therapy  4.  Followed by antiestrogen therapy 5.  Followed by neratinib   Breast MRI 06/28/2021: Confluent mass right breast 11.1 x 7.8 x 9.5 cm, 1 enlarged lymph node and second borderline lymph node in the right axilla Brain MRI 07/03/2021: No evidence of malignancy. CT CAP and bone scan: 07/10/2021: Numerous enhancing right breast masses and right axillary lymph nodes, nonspecific small pulmonary nodules, bone scan: No bone metastases URCC nausea study, ------------------------------------------------------------------------------------------------------------------------------------ Current treatment: TCHP started 07/12/2021 (Dignicap) Echocardiogram 07/07/2021: EF 55 to 60% 09/27/2021 EF 55%, normal GLS  Chemo toxicities: Sores on the lips: Resolved Fatigue.  I encouraged her to take short walks every day to help with this Mild nausea Diarrhea.  Managed well with Lomotil. Right IJ DVT.  She continues on Xarelto with improvement of her symptoms.  We will continue this  Jenna Cross and I talked about her upcoming MRI that is happening the day following her sixth cycle of chemotherapy.  She will meet with Dr. Donne Hazel afterwards.  She tells me that she met with Dr. Elisabeth Cara who will do her reconstruction.  She is leaning towards bilateral mastectomies but based on the MRI they will decide if they are going to be nipple sparing mastectomies or not.   I will send Dr. Donne Hazel a message about her upcoming schedule.  I let Minna Merritts know that while we are waiting on surgery to get scheduled she will continue to receive Herceptin.  We will see her back in 3 weeks for labs, follow-up, and her sixth and final cycle of neoadjuvant chemotherapy.  All questions were answered. The patient knows to call the clinic with any problems, questions or concerns. We can certainly see the patient much sooner if necessary.  Total encounter time: 30 minutes in face-to-face visit time, chart review, lab review, care coordination, and documentation of the encounter.  Wilber Bihari, NP 10/05/21 9:40 AM Medical Oncology and [facility] [place], [place] [address] Tel. [phone]    Fax. [phone]  *Total Encounter Time as defined by the Centers for Medicare and Medicaid Services includes, in addition to the face-to-face time of a patient visit (documented in the note above) non-face-to-face time: obtaining and reviewing outside history, ordering and reviewing medications, tests or procedures, care coordination (communications with other health care professionals or caregivers) and documentation in the medical record.

## 2021-10-04 NOTE — Progress Notes (Signed)
Location of Breast Cancer: upper-outer quadrant of right breast  Histology per Pathology Report: invasive lobular carcinoma with metastatic carcinoma involving one lymph node, ER+(95%)/PR+(85%)/Her2+ (3+).    Receptor Status:  The tumor cells are POSITIVE for Her2 (3+). Estrogen Receptor: 95%, POSITIVE, STRONG STAINING INTENSITY Progesterone Receptor: 85%, POSITIVE, STRONG STAINING INTENSITY Proliferation Marker Ki67: 25%  Did patient present with symptoms (if so, please note symptoms) or was this found on screening mammography?: Palpable lump in the right breast  Past/Anticipated interventions by surgeon, if any: plan bilateral mastectomies with sentinel lymph node study following chemotherapy.  Past/Anticipated interventions by medical oncology, if any: Dr Lindi Adie Patient is on Treatment Plan : BREAST  Docetaxel + Carboplatin + Trastuzumab + Pertuzumab  (TCHP) q21d    Treatment plan: 1. Neoadjuvant chemotherapy with TCH Perjeta 6 cycles followed by Herceptin Perjeta maintenance versus Kadcyla maintenance (based on response to neoadjuvant chemo) for 1 year 2. Followed by bilateral mastectomies with sentinel lymph node study 3. Followed by adjuvant radiation therapy  4.  Followed by antiestrogen therapy 5.  Followed by neratinib  Lymphedema issues, if any:   No   Pain issues, if any:   No  SAFETY ISSUES: Prior radiation?  No Pacemaker/ICD? No Possible current pregnancy? chemotherapy Is the patient on methotrexate? No  Current Complaints / other details:  No

## 2021-10-05 ENCOUNTER — Other Ambulatory Visit: Payer: Self-pay | Admitting: Hematology and Oncology

## 2021-10-05 ENCOUNTER — Inpatient Hospital Stay: Payer: 59 | Admitting: Adult Health

## 2021-10-05 ENCOUNTER — Inpatient Hospital Stay: Payer: 59

## 2021-10-05 ENCOUNTER — Ambulatory Visit: Payer: 59 | Admitting: Hematology and Oncology

## 2021-10-05 ENCOUNTER — Encounter: Payer: Self-pay | Admitting: Adult Health

## 2021-10-05 ENCOUNTER — Other Ambulatory Visit: Payer: Self-pay

## 2021-10-05 ENCOUNTER — Other Ambulatory Visit: Payer: 59

## 2021-10-05 VITALS — BP 112/63 | HR 96 | Temp 97.8°F | Resp 20 | Wt 137.4 lb

## 2021-10-05 DIAGNOSIS — C50411 Malignant neoplasm of upper-outer quadrant of right female breast: Secondary | ICD-10-CM | POA: Diagnosis not present

## 2021-10-05 DIAGNOSIS — Z170 Estrogen receptor positive status [ER+]: Secondary | ICD-10-CM

## 2021-10-05 DIAGNOSIS — Z95828 Presence of other vascular implants and grafts: Secondary | ICD-10-CM

## 2021-10-05 DIAGNOSIS — Z5111 Encounter for antineoplastic chemotherapy: Secondary | ICD-10-CM | POA: Diagnosis not present

## 2021-10-05 LAB — CBC WITH DIFFERENTIAL/PLATELET
Abs Immature Granulocytes: 0.04 10*3/uL (ref 0.00–0.07)
Basophils Absolute: 0 10*3/uL (ref 0.0–0.1)
Basophils Relative: 0 %
Eosinophils Absolute: 0 10*3/uL (ref 0.0–0.5)
Eosinophils Relative: 0 %
HCT: 30.5 % — ABNORMAL LOW (ref 36.0–46.0)
Hemoglobin: 10.1 g/dL — ABNORMAL LOW (ref 12.0–15.0)
Immature Granulocytes: 1 %
Lymphocytes Relative: 13 %
Lymphs Abs: 0.8 10*3/uL (ref 0.7–4.0)
MCH: 33.7 pg (ref 26.0–34.0)
MCHC: 33.1 g/dL (ref 30.0–36.0)
MCV: 101.7 fL — ABNORMAL HIGH (ref 80.0–100.0)
Monocytes Absolute: 0.1 10*3/uL (ref 0.1–1.0)
Monocytes Relative: 2 %
Neutro Abs: 5.2 10*3/uL (ref 1.7–7.7)
Neutrophils Relative %: 84 %
Platelets: 254 10*3/uL (ref 150–400)
RBC: 3 MIL/uL — ABNORMAL LOW (ref 3.87–5.11)
RDW: 14.9 % (ref 11.5–15.5)
WBC: 6.2 10*3/uL (ref 4.0–10.5)
nRBC: 0 % (ref 0.0–0.2)

## 2021-10-05 LAB — COMPREHENSIVE METABOLIC PANEL
ALT: 10 U/L (ref 0–44)
AST: 11 U/L — ABNORMAL LOW (ref 15–41)
Albumin: 3.8 g/dL (ref 3.5–5.0)
Alkaline Phosphatase: 95 U/L (ref 38–126)
Anion gap: 7 (ref 5–15)
BUN: 8 mg/dL (ref 6–20)
CO2: 25 mmol/L (ref 22–32)
Calcium: 9 mg/dL (ref 8.9–10.3)
Chloride: 108 mmol/L (ref 98–111)
Creatinine, Ser: 0.58 mg/dL (ref 0.44–1.00)
GFR, Estimated: >60 mL/min (ref >60)
Glucose, Bld: 122 mg/dL — ABNORMAL HIGH (ref 70–99)
Potassium: 4 mmol/L (ref 3.5–5.1)
Sodium: 140 mmol/L (ref 135–145)
Total Bilirubin: 0.3 mg/dL (ref 0.3–1.2)
Total Protein: 6.9 g/dL (ref 6.5–8.1)

## 2021-10-05 LAB — PREGNANCY, URINE: Preg Test, Ur: NEGATIVE

## 2021-10-05 MED ORDER — SODIUM CHLORIDE 0.9% FLUSH
10.0000 mL | INTRAVENOUS | Status: DC | PRN
  Administered 2021-10-05: 14:00:00 10 mL

## 2021-10-05 MED ORDER — PALONOSETRON HCL INJECTION 0.25 MG/5ML
0.2500 mg | Freq: Once | INTRAVENOUS | Status: AC
  Administered 2021-10-05: 10:00:00 0.25 mg via INTRAVENOUS
  Filled 2021-10-05: qty 5

## 2021-10-05 MED ORDER — ACETAMINOPHEN 325 MG PO TABS
650.0000 mg | ORAL_TABLET | Freq: Once | ORAL | Status: AC
  Administered 2021-10-05: 10:00:00 650 mg via ORAL
  Filled 2021-10-05: qty 2

## 2021-10-05 MED ORDER — SODIUM CHLORIDE 0.9 % IV SOLN
420.0000 mg | Freq: Once | INTRAVENOUS | Status: AC
  Administered 2021-10-05: 11:00:00 420 mg via INTRAVENOUS
  Filled 2021-10-05: qty 14

## 2021-10-05 MED ORDER — SODIUM CHLORIDE 0.9 % IV SOLN
75.0000 mg/m2 | Freq: Once | INTRAVENOUS | Status: AC
  Administered 2021-10-05: 12:00:00 130 mg via INTRAVENOUS
  Filled 2021-10-05: qty 13

## 2021-10-05 MED ORDER — HEPARIN SOD (PORK) LOCK FLUSH 100 UNIT/ML IV SOLN
500.0000 [IU] | Freq: Once | INTRAVENOUS | Status: AC | PRN
  Administered 2021-10-05: 14:00:00 500 [IU]

## 2021-10-05 MED ORDER — DIPHENHYDRAMINE HCL 25 MG PO CAPS
25.0000 mg | ORAL_CAPSULE | Freq: Once | ORAL | Status: AC
  Administered 2021-10-05: 10:00:00 25 mg via ORAL
  Filled 2021-10-05: qty 1

## 2021-10-05 MED ORDER — SODIUM CHLORIDE 0.9 % IV SOLN
10.0000 mg | Freq: Once | INTRAVENOUS | Status: AC
  Administered 2021-10-05: 10:00:00 10 mg via INTRAVENOUS
  Filled 2021-10-05: qty 10

## 2021-10-05 MED ORDER — SODIUM CHLORIDE 0.9 % IV SOLN
700.0000 mg | Freq: Once | INTRAVENOUS | Status: AC
  Administered 2021-10-05: 14:00:00 700 mg via INTRAVENOUS
  Filled 2021-10-05: qty 70

## 2021-10-05 MED ORDER — SODIUM CHLORIDE 0.9% FLUSH
10.0000 mL | INTRAVENOUS | Status: AC | PRN
  Administered 2021-10-05: 09:00:00 10 mL

## 2021-10-05 MED ORDER — SODIUM CHLORIDE 0.9 % IV SOLN: Freq: Once | INTRAVENOUS | Status: AC

## 2021-10-05 MED ORDER — TRASTUZUMAB-ANNS CHEMO 150 MG IV SOLR
6.0000 mg/kg | Freq: Once | INTRAVENOUS | Status: AC
  Administered 2021-10-05: 11:00:00 378 mg via INTRAVENOUS
  Filled 2021-10-05: qty 18

## 2021-10-05 MED ORDER — SODIUM CHLORIDE 0.9 % IV SOLN
150.0000 mg | Freq: Once | INTRAVENOUS | Status: AC
  Administered 2021-10-05: 10:00:00 150 mg via INTRAVENOUS
  Filled 2021-10-05: qty 150

## 2021-10-05 NOTE — Assessment & Plan Note (Signed)
Palpable lump in the right breast. Diagnostic mammogram and [place]: a 3.8 cm mass at the retroareolar right breast, 1 prominent right axillary lymph node with a 4 mm cortex, 1 other which is borderline measuring 3 mm. 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 3 o'clock position: Biopsy fibroadenoma  Treatment plan: 1. Neoadjuvant chemotherapy with TCH Perjeta 6 cycles followed by Herceptin Perjeta maintenance versus Kadcyla maintenance (based on response to neoadjuvant chemo) for 1 year 2. Followed bybilateral mastectomieswith sentinel lymph node study 3. Followed by adjuvant radiation therapy 4.Followed by antiestrogen therapy 5.Followed by neratinib  Breast MRI 06/28/2021: Confluent mass right breast 11.1 x 7.8 x 9.5 cm, 1 enlarged lymph node and second borderline lymph node in the right axilla Brain MRI 07/03/2021: No evidence of malignancy. CT CAP and bone scan:07/10/2021: Numerous enhancing right breast masses and right axillary lymph nodes, nonspecific small pulmonary nodules,bone scan: No bone metastases URCC nausea study, ------------------------------------------------------------------------------------------------------------------------------------ Current treatment:TCHP started10/02/2021 (Dignicap) Echocardiogram 07/07/2021: EF 55 to 60% 09/27/2021 EF 55%, normal GLS  Chemo toxicities: 1. Sores on the lips: Resolved 2. Fatigue.  I encouraged her to take short walks every day to help with this 3. Mild nausea 4. Diarrhea.  Managed well with Lomotil. 5. Right IJ DVT.  She continues on Xarelto with improvement of her symptoms.  We will continue this  Analis and I talked about her upcoming MRI that is happening the day following her sixth cycle of chemotherapy.  She will meet with Dr. Donne Hazel afterwards.  She tells me that she met with Dr. Elisabeth Cara who will do her reconstruction.  She is leaning  towards bilateral mastectomies but based on the MRI they will decide if they are going to be nipple sparing mastectomies or not.  I will send Dr. Donne Hazel a message about her upcoming schedule.  I let Minna Merritts know that while we are waiting on surgery to get scheduled she will continue to receive Herceptin.  We will see her back in 3 weeks for labs, follow-up, and her sixth and final cycle of neoadjuvant chemotherapy.

## 2021-10-07 ENCOUNTER — Inpatient Hospital Stay: Payer: 59

## 2021-10-07 ENCOUNTER — Other Ambulatory Visit: Payer: Self-pay

## 2021-10-07 VITALS — BP 103/63 | HR 89 | Temp 98.1°F | Resp 18

## 2021-10-07 DIAGNOSIS — Z170 Estrogen receptor positive status [ER+]: Secondary | ICD-10-CM

## 2021-10-07 DIAGNOSIS — C50411 Malignant neoplasm of upper-outer quadrant of right female breast: Secondary | ICD-10-CM

## 2021-10-07 DIAGNOSIS — Z5111 Encounter for antineoplastic chemotherapy: Secondary | ICD-10-CM | POA: Diagnosis not present

## 2021-10-07 MED ORDER — PEGFILGRASTIM-BMEZ 6 MG/0.6ML ~~LOC~~ SOSY
6.0000 mg | PREFILLED_SYRINGE | Freq: Once | SUBCUTANEOUS | Status: AC
  Administered 2021-10-07: 6 mg via SUBCUTANEOUS
  Filled 2021-10-07: qty 0.6

## 2021-10-07 MED ORDER — GOSERELIN ACETATE 3.6 MG ~~LOC~~ IMPL
3.6000 mg | DRUG_IMPLANT | Freq: Once | SUBCUTANEOUS | Status: AC
Start: 2021-10-07 — End: 2021-10-07
  Administered 2021-10-07: 3.6 mg via SUBCUTANEOUS

## 2021-10-07 NOTE — Patient Instructions (Addendum)
Pegfilgrastim Injection What is this medication? PEGFILGRASTIM (PEG fil gra stim) lowers the risk of infection in people who are receiving chemotherapy. It works by Building control surveyor make more white blood cells, which protects your body from infection. It may also be used to help people who have been exposed to high doses of radiation. This medicine may be used for other purposes; ask your health care provider or pharmacist if you have questions. COMMON BRAND NAME(S): Rexene Edison, Ziextenzo What should I tell my care team before I take this medication? They need to know if you have any of these conditions: Kidney disease Latex allergy Ongoing radiation therapy Sickle cell disease Skin reactions to acrylic adhesives (On-Body Injector only) An unusual or allergic reaction to pegfilgrastim, filgrastim, other medications, foods, dyes, or preservatives Pregnant or trying to get pregnant Breast-feeding How should I use this medication? This medication is for injection under the skin. If you get this medication at home, you will be taught how to prepare and give the pre-filled syringe or how to use the On-body Injector. Refer to the patient Instructions for Use for detailed instructions. Use exactly as directed. Tell your care team immediately if you suspect that the On-body Injector may not have performed as intended or if you suspect the use of the On-body Injector resulted in a missed or partial dose. It is important that you put your used needles and syringes in a special sharps container. Do not put them in a trash can. If you do not have a sharps container, call your pharmacist or care team to get one. Talk to your care team about the use of this medication in children. While this medication may be prescribed for selected conditions, precautions do apply. Overdosage: If you think you have taken too much of this medicine contact a poison control center or emergency room at  once. NOTE: This medicine is only for you. Do not share this medicine with others. What if I miss a dose? It is important not to miss your dose. Call your care team if you miss your dose. If you miss a dose due to an On-body Injector failure or leakage, a new dose should be administered as soon as possible using a single prefilled syringe for manual use. What may interact with this medication? Interactions have not been studied. This list may not describe all possible interactions. Give your health care provider a list of all the medicines, herbs, non-prescription drugs, or dietary supplements you use. Also tell them if you smoke, drink alcohol, or use illegal drugs. Some items may interact with your medicine. What should I watch for while using this medication? Your condition will be monitored carefully while you are receiving this medication. You may need blood work done while you are taking this medication. Talk to your care team about your risk of cancer. You may be more at risk for certain types of cancer if you take this medication. If you are going to need a MRI, CT scan, or other procedure, tell your care team that you are using this medication (On-Body Injector only). What side effects may I notice from receiving this medication? Side effects that you should report to your care team as soon as possible: Allergic reactions--skin rash, itching, hives, swelling of the face, lips, tongue, or throat Capillary leak syndrome--stomach or muscle pain, unusual weakness or fatigue, feeling faint or lightheaded, decrease in the amount of urine, swelling of the ankles, hands, or feet, trouble breathing High  white blood cell level--fever, fatigue, trouble breathing, night sweats, change in vision, weight loss Inflammation of the aorta--fever, fatigue, back, chest, or stomach pain, severe headache Kidney injury (glomerulonephritis)--decrease in the amount of urine, red or dark brown urine, foamy or  bubbly urine, swelling of the ankles, hands, or feet Shortness of breath or trouble breathing Spleen injury--pain in upper left stomach or shoulder Unusual bruising or bleeding Side effects that usually do not require medical attention (report to your care team if they continue or are bothersome): Bone pain Pain in the hands or feet This list may not describe all possible side effects. Call your doctor for medical advice about side effects. You may report side effects to FDA at 1-800-FDA-1088. Where should I keep my medication? Keep out of the reach of children. If you are using this medication at home, you will be instructed on how to store it. Throw away any unused medication after the expiration date on the label. NOTE: This sheet is a summary. It may not cover all possible information. If you have questions about this medicine, talk to your doctor, pharmacist, or health care provider. Goserelin injection What is this medication? GOSERELIN (GOE se rel in) is similar to a hormone found in the body. It lowers the amount of sex hormones that the body makes. Men will have lower testosterone levels and women will have lower estrogen levels while taking this medicine. In men, this medicine is used to treat prostate cancer; the injection is either given once per month or once every 12 weeks. A once per month injection (only) is used to treat women with endometriosis, dysfunctional uterine bleeding, or advanced breast cancer. This medicine may be used for other purposes; ask your health care provider or pharmacist if you have questions. COMMON BRAND NAME(S): Zoladex, Zoladex 70-Month What should I tell my care team before I take this medication? They need to know if you have any of these conditions: bone problems diabetes heart disease history of irregular heartbeat an unusual or allergic reaction to goserelin, other medicines, foods, dyes, or preservatives pregnant or trying to get  pregnant breast-feeding How should I use this medication? This medicine is for injection under the skin. It is given by a health care professional in a hospital or clinic setting. Talk to your pediatrician regarding the use of this medicine in children. Special care may be needed. Overdosage: If you think you have taken too much of this medicine contact a poison control center or emergency room at once. NOTE: This medicine is only for you. Do not share this medicine with others. What if I miss a dose? It is important not to miss your dose. Call your doctor or health care professional if you are unable to keep an appointment. What may interact with this medication? Do not take this medicine with any of the following medications: cisapride dronedarone pimozide thioridazine This medicine may also interact with the following medications: other medicines that prolong the QT interval (an abnormal heart rhythm) This list may not describe all possible interactions. Give your health care provider a list of all the medicines, herbs, non-prescription drugs, or dietary supplements you use. Also tell them if you smoke, drink alcohol, or use illegal drugs. Some items may interact with your medicine. What should I watch for while using this medication? Visit your doctor or health care provider for regular checks on your progress. Your symptoms may appear to get worse during the first weeks of this therapy. Tell your doctor  or healthcare provider if your symptoms do not start to get better or if they get worse after this time. Your bones may get weaker if you take this medicine for a long time. If you smoke or frequently drink alcohol you may increase your risk of bone loss. A family history of osteoporosis, chronic use of drugs for seizures (convulsions), or corticosteroids can also increase your risk of bone loss. Talk to your doctor about how to keep your bones strong. This medicine should stop regular  monthly menstruation in women. Tell your doctor if you continue to menstruate. Women should not become pregnant while taking this medicine or for 12 weeks after stopping this medicine. Women should inform their doctor if they wish to become pregnant or think they might be pregnant. There is a potential for serious side effects to an unborn child. Talk to your health care professional or pharmacist for more information. Do not breast-feed an infant while taking this medicine. Men should inform their doctors if they wish to father a child. This medicine may lower sperm counts. Talk to your health care professional or pharmacist for more information. This medicine may increase blood sugar. Ask your healthcare provider if changes in diet or medicines are needed if you have diabetes. What side effects may I notice from receiving this medication? Side effects that you should report to your doctor or health care professional as soon as possible: allergic reactions like skin rash, itching or hives, swelling of the face, lips, or tongue bone pain breathing problems changes in vision chest pain feeling faint or lightheaded, falls fever, chills pain, swelling, warmth in the leg pain, tingling, numbness in the hands or feet signs and symptoms of high blood sugar such as being more thirsty or hungry or having to urinate more than normal. You may also feel very tired or have blurry vision signs and symptoms of low blood pressure like dizziness; feeling faint or lightheaded, falls; unusually weak or tired stomach pain swelling of the ankles, feet, hands trouble passing urine or change in the amount of urine unusually high or low blood pressure unusually weak or tired Side effects that usually do not require medical attention (report to your doctor or health care professional if they continue or are bothersome): change in sex drive or performance changes in breast size in both males and females changes in  emotions or moods headache hot flashes irritation at site where injected loss of appetite skin problems like acne, dry skin vaginal dryness This list may not describe all possible side effects. Call your doctor for medical advice about side effects. You may report side effects to FDA at 1-800-FDA-1088. Where should I keep my medication? This drug is given in a hospital or clinic and will not be stored at home. NOTE: This sheet is a summary. It may not cover all possible information. If you have questions about this medicine, talk to your doctor, pharmacist, or health care provider.  2022 [employer] (2019-01-23 00:00:00)   2022 [employer] (2021-06-13 00:00:00)

## 2021-10-10 ENCOUNTER — Other Ambulatory Visit: Payer: Self-pay | Admitting: Hematology and Oncology

## 2021-10-10 DIAGNOSIS — Z170 Estrogen receptor positive status [ER+]: Secondary | ICD-10-CM

## 2021-10-10 DIAGNOSIS — C50411 Malignant neoplasm of upper-outer quadrant of right female breast: Secondary | ICD-10-CM

## 2021-10-10 NOTE — Progress Notes (Signed)
Radiation Oncology         [phone]) [phone] ________________________________  Initial Outpatient Consultation  Name: Jenna Cross MRN: [id number]  Date: 10/11/2021  DOB: 10/12/1985  KG:YJEHUD, Sharyn Lull, MD  Nicholas Lose, MD   REFERRING PHYSICIAN: Nicholas Lose, MD  DIAGNOSIS: Stage IB (cT2, cN1, cM0) Right Breast UOQ, Invasive Lobular Carcinoma, ER+ / PR+ / Her2+, Grade 2-3  HISTORY OF PRESENT ILLNESS::Jenna Cross is a 36 y.o. female who is by herself today . she is seen as a courtesy of Dr. Lindi Adie for an opinion concerning radiation therapy as part of management for her recently diagnosed right breast cancer.   The patient initially presented with a palpable lump in the inferior area of the right breast. Diagnostic mammogram and ultrasound on 06/19/21 revealed a 3.8 cm mass at the palpable site of concern in the retroareolar right breast, 1 prominent right axillary lymph node with a 4 mm cortex, 1 other which appeared borderline measuring 3 mm, and an indeterminate 4 mm mass in the left breast at the 3 o'clock position.   Right breast biopsies on 06/23/21 revealed invasive lobular carcinoma, with metastatic carcinoma involving one right axillary lymph node, with IMC from the right retroareolar biopsy measuring 1.4 cm, and IMC from the 9 o'clock biopsy measuring 0.6 cm.  ER status 95% positive, PR status 85% positive, both with strong staining intensity, Her2 status positive, Proliferation marker Ki67 at 25%, Grade 2-3.   Biopsies of the left breast on 06/27/21 showed no evidence of atypia or malignancy.   Bilateral breast MRI on 06/28/21 showed confluent enhancing masses throughout all quadrants of the right breast spanning 11.1 x 7.8 x 9.5 cm, as well as one enlarged lymph node and a second borderline lymph node in the right axilla. No suspicious findings were appreciated within the left breast.   Accordingly, the patient was referred to Dr. Lindi Adie on 06/29/21 to discuss her  treatment options. Following discussion at the multidisciplinary tumor board, Dr. Lindi Adie recommended the patient to proceed with neoadjuvant chemotherapy consisting of Holcombe Perjeta 6 cycles, followed by Herceptin Perjeta maintenance versus Kadcyla maintenance (based on response to neoadjuvant chemo) for 1 year. The patient proceeded with the above plan and received her first infusion on 07/12/21. Chemo toxicities reported by the patient while receiving treatment included lip sores, fatigue, mild nausea, and diarrhea. (Following completion of chemo, the patient's recommended treatment plan included bilateral mastectomies w/ SNL, followed by XRT, antiestrogen therapy, and neratinib).    MRI of the brain on 07/03/21 revealed no evidence of intracranial metastatic disease or other acute intracranial pathology.  CT of the chest abdomen and pelvis on 07/10/21 again demonstrated numerous enhancing masses throughout the right breast, as well as right axillary lymphadenopathy. No evidence of distant metastatic disease was appreciated. However, some nonspecific small solid pulmonary nodules were seen. Attention to follow up was recommended in regards to this finding.   Whole body bone scan on 07/13/21 revealed no evidence of bony metastatic disease. Low level radiotracer uptake within the soft tissues of the right breast was noted, corresponding to known infiltrative right breast cancer.  While receiving treatment, the patient urgently presented to Dr. 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 Accordingly, Dr. Lindi Adie prescribed the patient Xarelto. This diagnosis made the patient very anxious. Dr. Lindi Adie has reassured the patient that DVT will not impact her treatments.    On 10/02/21, the patient presented  to the ED for evaluation of neck swelling and pain.  Patient stated that her pain was in the area of her diagnosed thrombus. She  denied any chest pain, shortness of breath, abdominal pain nausea, vomiting, visual deficits or other systemic symptoms.  She also reported dysphagia, and mild decreased range of motion of the neck because it hurt for her to turn. Physical exam revealed a small palpable area of tenderness over the site of known IJ thormbus on the left, but was noted to appear otherwise unremarkable. An ultrasound was obtained of the neck which showed no extension of the clot. The patient was discharged home that same day and instructed to follow up with outpatient oncology. The patient opted to continue on xarelto as recommended at discharge by on call oncologist.   During follow up with Dr. Geralyn Flash PA on 10/05/21, the patient reported near complete resolution of her neck pain. The patient did report some worsening fatigue, but was otherwise noted to be doing well.   She has met with Dr. Marla Roe and the patient has elected to proceed with bilateral mastectomies.  She is also considering nipple sparing mastectomies depending on recommendations from Dr. Donne Hazel and Dr. Marla Roe.  She will likely proceed with immediate reconstruction after her mastectomies.  She is now referred to radiation oncology for discussion of postmastectomy radiation therapy.   PREVIOUS RADIATION THERAPY: No  PAST MEDICAL HISTORY:  Past Medical History:  Diagnosis Date   Anemia    Anxiety    Anxiety    no medication   Brain cancer ([place])    Cancer ([place])    Family history of prostate cancer 07/17/2021    PAST SURGICAL HISTORY: Past Surgical History:  Procedure Laterality Date   BREAST SURGERY     cyst removal from right breast    CESAREAN SECTION N/A 01/31/2021   Procedure: CESAREAN SECTION;  Surgeon: Dian Queen, MD;  Location: [facility]S;  Service: Obstetrics;  Laterality: N/A;   excision of fibroadenoma of breast     PORTACATH PLACEMENT N/A 07/11/2021   Procedure: INSERTION PORT-A-CATH;  Surgeon: Rolm Bookbinder,  MD;  Location: [facility];  Service: General;  Laterality: N/A;   TONSILLECTOMY     and adenoids   TONSILLECTOMY AND ADENOIDECTOMY     WISDOM TOOTH EXTRACTION      FAMILY HISTORY:  Family History  Problem Relation Age of Onset   Migraines Mother    Lymphoma Paternal Aunt        dx late 61s   Prostate cancer Maternal Grandfather        dx after 26    SOCIAL HISTORY:  Social History   Tobacco Use   Smoking status: Former   Smokeless tobacco: Never  Scientific laboratory technician Use: Never used  Substance Use Topics   Alcohol use: Not Currently   Drug use: Never    ALLERGIES: No Known Allergies  MEDICATIONS:  Current Outpatient Medications  Medication Sig Dispense Refill   acetaminophen (TYLENOL) 500 MG tablet Take 1,000 mg by mouth every 6 (six) hours as needed for mild pain or headache.     clindamycin (CLINDAGEL) 1 % gel Apply topically 2 (two) times daily. 30 g 3   dexamethasone (DECADRON) 4 MG tablet TAKE 1 TABLET DAILY. TAKE 1 TABLET DAY BEFORE CHEMO AND 1 TABLET DAY AFTER CHEMO WITH FOOD 12 tablet 0   diphenoxylate-atropine (LOMOTIL) 2.5-0.025 MG tablet Take 1 tablet by mouth 3 (three) times daily as needed for  diarrhea or loose stools. 30 tablet 3   escitalopram (LEXAPRO) 20 MG tablet Take 20 mg by mouth daily.     lidocaine-prilocaine (EMLA) cream Apply to affected area once 30 g 3   loratadine (CLARITIN) 10 MG tablet Take 10 mg by mouth daily.     LORazepam (ATIVAN) 0.5 MG tablet TAKE 1 TABLET (0.5 MG TOTAL) BY MOUTH AT BEDTIME AS NEEDED (NAUSEA OR VOMITING). 30 tablet 0   ondansetron (ZOFRAN) 8 MG tablet Take 1 tablet (8 mg total) by mouth 2 (two) times daily as needed (Nausea or vomiting). Start on the third day after chemotherapy. 30 tablet 1   prochlorperazine (COMPAZINE) 10 MG tablet Take 1 tablet (10 mg total) by mouth every 6 (six) hours as needed (Nausea or vomiting). 30 tablet 1   RIVAROXABAN (XARELTO) VTE STARTER PACK (15 & 20 MG) Follow package  directions: Take one 49m tablet by mouth twice a day. On day 22, switch to one 228mtablet once a day. Take with food. 51 each 0   traMADol (ULTRAM) 50 MG tablet Take 2 tablets (100 mg total) by mouth every 6 (six) hours as needed. 10 tablet 0   valACYclovir (VALTREX) 500 MG tablet Take 500 mg by mouth 2 (two) times daily.     cetirizine (ZYRTEC) 10 MG tablet Take 10 mg by mouth at bedtime. (Patient not taking: Reported on 10/11/2021)     ferrous sulfate 325 (65 FE) MG EC tablet Take 1 tablet (325 mg total) by mouth 2 (two) times daily. (Patient not taking: Reported on 10/11/2021) 60 tablet 2   ibuprofen (ADVIL) 600 MG tablet Take 1 tablet (600 mg total) by mouth every 6 (six) hours as needed. (Patient not taking: Reported on 10/11/2021) 30 tablet 0   magic mouthwash w/lidocaine SOLN Take 5 mLs by mouth 3 (three) times daily as needed for mouth pain. 100 mL 0   No current facility-administered medications for this encounter.    REVIEW OF SYSTEMS:  A 10+ POINT REVIEW OF SYSTEMS WAS OBTAINED including neurology, dermatology, psychiatry, cardiac, respiratory, lymph, extremities, GI, GU, musculoskeletal, constitutional, reproductive, HEENT.  She reports tolerating the neoadjuvant chemotherapy reasonably well.  She has noticed the mass in her right breast to shrink with her neoadjuvant treatment.  She denies any pain within either breast nipple discharge or bleeding.   PHYSICAL EXAM:  height is 5' 4"  (1.626 m) and weight is 132 lb (59.9 kg). Her temporal temperature is 97.2 F (36.2 C) (abnormal). Her blood pressure is 97/67 and her pulse is 120 (abnormal). Her respiration is 18 and oxygen saturation is 99%.   General: Alert and oriented, in no acute distress, very pleasant HEENT: Head is normocephalic. Extraocular movements are intact.  Neck: Neck is supple, no palpable cervical or supraclavicular lymphadenopathy. Heart: Regular in rate and rhythm with no murmurs, rubs, or gallops. Chest: Clear to  auscultation bilaterally, with no rhonchi, wheezes, or rales. Abdomen: Soft, nontender, nondistended, with no rigidity or guarding. Extremities: No cyanosis or edema. Lymphatics: see Neck Exam Skin: No concerning lesions. Musculoskeletal: symmetric strength and muscle tone throughout. Neurologic: Cranial nerves II through XII are grossly intact. No obvious focalities. Speech is fluent. Coordination is intact. Psychiatric: Judgment and insight are intact. Affect is appropriate. Left Breast: no palpable mass, nipple discharge or bleeding.  Right Breast: Small palpable mass in the inferior retroareolar area.  This mass estimated to be approximately 1.5 cm in greatest dimension.  No nipple discharge or bleeding.  No other palpable  masses within the right breast.    ECOG = 1  0 - Asymptomatic (Fully active, able to carry on all predisease activities without restriction)  1 - Symptomatic but completely ambulatory (Restricted in physically strenuous activity but ambulatory and able to carry out work of a light or sedentary nature. For example, light housework, office work)  2 - Symptomatic, <50% in bed during the day (Ambulatory and capable of all self care but unable to carry out any work activities. Up and about more than 50% of waking hours)  3 - Symptomatic, >50% in bed, but not bedbound (Capable of only limited self-care, confined to bed or chair 50% or more of waking hours)  4 - Bedbound (Completely disabled. Cannot carry on any self-care. Totally confined to bed or chair)  5 - Death   Eustace Pen MM, Creech RH, Tormey DC, et al. [phone]). "Toxicity and response criteria of the [facility]". [place] Oncol. 5 (6): 649-55  LABORATORY DATA:  Lab Results  Component Value Date   WBC 6.2 10/05/2021   HGB 10.1 (L) 10/05/2021   HCT 30.5 (L) 10/05/2021   MCV 101.7 (H) 10/05/2021   PLT 254 10/05/2021   NEUTROABS 5.2 10/05/2021   Lab Results  Component Value Date   NA  140 10/05/2021   K 4.0 10/05/2021   CL 108 10/05/2021   CO2 25 10/05/2021   GLUCOSE 122 (H) 10/05/2021   CREATININE 0.58 10/05/2021   CALCIUM 9.0 10/05/2021      RADIOGRAPHY: ECHOCARDIOGRAM COMPLETE  Result Date: 09/27/2021    ECHOCARDIOGRAM REPORT   Patient Name:   Jenna Cross Date of Exam: 09/27/2021 Medical Rec #:  [id number]          Height:       64.0 in Accession #:    [id number]         Weight:       136.4 lb Date of Birth:  09-07-1986         BSA:          1.663 m Patient Age:    59 years           BP:           114/58 mmHg Patient Gender: F                  HR:           87 bpm. Exam Location:  Outpatient Procedure: 2D Echo, Cardiac Doppler, Color Doppler and Strain Analysis Indications:    Chemo  History:        Patient has prior history of Echocardiogram examinations.  Sonographer:    Jyl Heinz Referring Phys: [place]  1. Left ventricular ejection fraction, by estimation, is 55%. The left ventricle has normal function. The left ventricle has no regional wall motion abnormalities. Left ventricular diastolic parameters were normal. The average left ventricular global longitudinal strain is -17.8 %. The global longitudinal strain is normal.  2. Right ventricular systolic function is normal. The right ventricular size is normal. There is normal pulmonary artery systolic pressure.  3. The mitral valve is normal in structure. Trivial mitral valve regurgitation.  4. The aortic valve is tricuspid. Aortic valve regurgitation is not visualized. No aortic stenosis is present.  5. The inferior vena cava is normal in size with greater than 50% respiratory variability, suggesting right atrial pressure of 3 mmHg. Comparison(s): A prior study was performed on 07/07/21. No change  in [place]. FINDINGS  Left Ventricle: Left ventricular ejection fraction, by estimation, is 55%. The left ventricle has normal function. The left ventricle has no regional wall motion  abnormalities. The average left ventricular global longitudinal strain is -17.8 %. The global longitudinal strain is normal. The left ventricular internal cavity size was normal in size. There is no left ventricular hypertrophy. Left ventricular diastolic parameters were normal. Right Ventricle: The right ventricular size is normal. No increase in right ventricular wall thickness. Right ventricular systolic function is normal. There is normal pulmonary artery systolic pressure. The tricuspid regurgitant velocity is 1.89 m/s, and  with an assumed right atrial pressure of 3 mmHg, the estimated right ventricular systolic pressure is 09.2 mmHg. Left Atrium: Left atrial size was normal in size. Right Atrium: Right atrial size was normal in size. Pericardium: There is no evidence of pericardial effusion. Mitral Valve: The mitral valve is normal in structure. Trivial mitral valve regurgitation. Tricuspid Valve: The tricuspid valve is normal in structure. Tricuspid valve regurgitation is trivial. No evidence of tricuspid stenosis. Aortic Valve: The aortic valve is tricuspid. Aortic valve regurgitation is not visualized. No aortic stenosis is present. Aortic valve peak gradient measures 6.5 mmHg. Pulmonic Valve: The pulmonic valve was normal in structure. Pulmonic valve regurgitation is not visualized. No evidence of pulmonic stenosis. Aorta: The aortic root and ascending aorta are structurally normal, with no evidence of dilitation. Venous: The inferior vena cava is normal in size with greater than 50% respiratory variability, suggesting right atrial pressure of 3 mmHg. IAS/Shunts: The atrial septum is grossly normal.  LEFT VENTRICLE PLAX 2D LVIDd:         4.60 cm      Diastology LVIDs:         3.50 cm      LV e' medial:    8.70 cm/s LV PW:         0.80 cm      LV E/e' medial:  5.4 LV IVS:        0.70 cm      LV e' lateral:   12.10 cm/s LVOT diam:     2.00 cm      LV E/e' lateral: 3.9 LV SV:         49 LV SV Index:   30            2D Longitudinal Strain LVOT Area:     3.14 cm     2D Strain GLS Avg:     -17.8 %  LV Volumes (MOD) LV vol d, MOD A2C: 108.0 ml 3D Volume EF: LV vol d, MOD A4C: 106.0 ml 3D EF:        53 % LV vol s, MOD A2C: 45.4 ml  LV EDV:       122 ml LV vol s, MOD A4C: 47.4 ml  LV ESV:       57 ml LV SV MOD A2C:     62.6 ml  LV SV:        65 ml LV SV MOD A4C:     106.0 ml LV SV MOD BP:      60.4 ml RIGHT VENTRICLE            IVC RV Basal diam:  3.00 cm    IVC diam: 1.70 cm RV Mid diam:    2.50 cm RV S prime:     9.79 cm/s TAPSE (M-mode): 1.9 cm LEFT ATRIUM  Index        RIGHT ATRIUM           Index LA diam:        3.30 cm 1.98 cm/m   RA Area:     11.20 cm LA Vol (A2C):   41.3 ml 24.84 ml/m  RA Volume:   24.40 ml  14.68 ml/m LA Vol (A4C):   33.8 ml 20.33 ml/m LA Biplane Vol: 38.1 ml 22.91 ml/m  AORTIC VALVE AV Area (Vmax): 2.24 cm AV Vmax:        127.00 cm/s AV Peak Grad:   6.5 mmHg LVOT Vmax:      90.70 cm/s LVOT Vmean:     64.000 cm/s LVOT VTI:       0.157 m  AORTA Ao Root diam: 2.70 cm Ao Asc diam:  2.70 cm MITRAL VALVE               TRICUSPID VALVE MV Area (PHT): 4.86 cm    TR Peak grad:   14.3 mmHg MV Decel Time: 156 msec    TR Vmax:        189.00 cm/s MV E velocity: 47.10 cm/s MV A velocity: 40.70 cm/s  SHUNTS MV E/A ratio:  1.16        Systemic VTI:  0.16 m                            Systemic Diam: 2.00 cm Rudean Haskell MD Electronically signed by Rudean Haskell MD Signature Date/Time: 09/27/2021/12:49:45 PM    Final    UE Venous Duplex ([facility] and [facility] ONLY)  Result Date: 10/03/2021 UPPER VENOUS STUDY  Patient Name:  Jenna Cross  Date of Exam:   10/02/2021 Medical Rec #: [id number]           Accession #:    [id number] Date of Birth: 03-26-1986          Patient Gender: F Patient Age:   7 years Exam Location:  [facility] Procedure:      VAS [place] UPPER EXTREMITY VENOUS DUPLEX Referring Phys: Debbe Mounts  --------------------------------------------------------------------------------  Indications: Known left IJV DVT, with increasing left neck tenderness and swelling Comparison Study: 09/26/2021- left upper extremity venous duplex with acute DVT                   left IJV Performing Technologist: Maudry Mayhew MHA, RDMS, RVT, RDCS  Examination Guidelines: A complete evaluation includes B-mode imaging, spectral Doppler, color Doppler, and power Doppler as needed of all accessible portions of each vessel. Bilateral testing is considered an integral part of a complete examination. Limited examinations for reoccurring indications may be performed as noted.  Right Findings: +----------+------------+---------+-----------+----------+-------+  RIGHT      Compressible Phasicity Spontaneous Properties Summary  +----------+------------+---------+-----------+----------+-------+  Subclavian                 Yes        Yes                         +----------+------------+---------+-----------+----------+-------+  Left Findings: +----------+------------+---------+-----------+------------+-----------------+  LEFT       Compressible Phasicity Spontaneous  Properties       Summary       +----------+------------+---------+-----------+------------+-----------------+  IJV            None  No      Heterogenous Age Indeterminate  +----------+------------+---------+-----------+------------+-----------------+  Subclavian     Full        Yes                                                +----------+------------+---------+-----------+------------+-----------------+  Axillary       Full        Yes                                                +----------+------------+---------+-----------+------------+-----------------+  Brachial       Full        Yes                                                +----------+------------+---------+-----------+------------+-----------------+  Radial         Full                                                            +----------+------------+---------+-----------+------------+-----------------+  Ulnar          Full                                                           +----------+------------+---------+-----------+------------+-----------------+  Cephalic       Full                                                           +----------+------------+---------+-----------+------------+-----------------+  Basilic        Full                                                           +----------+------------+---------+-----------+------------+-----------------+  Summary:  Right: No evidence of thrombosis in the subclavian.  Left: Findings consistent with age indeterminate deep vein thrombosis involving the left internal jugular vein. When compared to prior study 09/26/2021, there is no extension of thrombus.  *See table(s) above for measurements and observations.  Diagnosing physician: Harold Barban MD Electronically signed by Harold Barban MD on 10/03/2021 at 6:38:43 PM.    Final    VAS [place] UPPER EXTREMITY VENOUS DUPLEX  Result Date: 09/26/2021 UPPER VENOUS STUDY  Patient Name:  Jenna Cross  Date of Exam:   09/26/2021 Medical Rec #: [id number]           Accession #:  [id number] Date of Birth: 1985-11-09          Patient Gender: F Patient Age:   16 years Exam Location[facility]  [facility] Procedure:      VAS [place] UPPER EXTREMITY VENOUS DUPLEX Referring Phys: Nicholas Lose --------------------------------------------------------------------------------  Indications: Pain Risk Factors: Cancer. Comparison Study: No prior studies. Performing Technologist: Oliver Hum RVT  Examination Guidelines: A complete evaluation includes B-mode imaging, spectral Doppler, color Doppler, and power Doppler as needed of all accessible portions of each vessel. Bilateral testing is considered an integral part of a complete examination. Limited examinations for reoccurring indications may be performed as  noted.  Right Findings: +----------+------------+---------+-----------+----------+-------+  RIGHT      Compressible Phasicity Spontaneous Properties Summary  +----------+------------+---------+-----------+----------+-------+  Subclavian     Full        Yes        Yes                         +----------+------------+---------+-----------+----------+-------+  Left Findings: +----------+------------+---------+-----------+----------+-------+  LEFT       Compressible Phasicity Spontaneous Properties Summary  +----------+------------+---------+-----------+----------+-------+  IJV            None        No         No                  Acute   +----------+------------+---------+-----------+----------+-------+  Subclavian     Full        Yes        Yes                         +----------+------------+---------+-----------+----------+-------+  Axillary       Full        Yes        Yes                         +----------+------------+---------+-----------+----------+-------+  Brachial       Full        Yes        Yes                         +----------+------------+---------+-----------+----------+-------+  Radial         Full                                               +----------+------------+---------+-----------+----------+-------+  Ulnar          Full                                               +----------+------------+---------+-----------+----------+-------+  Cephalic       Full                                               +----------+------------+---------+-----------+----------+-------+  Basilic        Full                                               +----------+------------+---------+-----------+----------+-------+  Summary:  Right: No evidence of thrombosis in the subclavian.  Left: No evidence of superficial vein thrombosis in the upper extremity. Findings consistent with acute deep vein thrombosis involving the left internal jugular vein.  *See table(s) above for measurements and observations.  Diagnosing  physician: Deitra Mayo MD Electronically signed by Deitra Mayo MD on 09/26/2021 at 3:56:23 PM.    Final       IMPRESSION: Stage IB (cT2, cN1, cM0) Right Breast UOQ, Invasive lobular Carcinoma, ER+ / PR+ / Her2+, Grade 2-3  She would be a good candidate for postmastectomy radiation therapy given her initial tumor size and lymph node positivity at diagnosis.  We discussed that ideally radiation should start approximately 5 to 6 weeks after her surgery.  She anticipates surgery in the lateral portion of February.    We discussed the natural history of invasive lobular breast cancer and general treatment, highlighting the role of radiotherapy in the management.  We discussed the available radiation techniques, and focused on the details of logistics and delivery.  We reviewed the anticipated acute and late sequelae associated with radiation in this setting.  The patient was encouraged to ask questions that I answered to the best of my ability.  A patient consent form was discussed and signed.  We retained a copy for our records.    PLAN: She will be referred back to radiation oncology when she has completed her mastectomy and immediate reconstruction.  Anticipate 6 weeks of radiation therapy.  Radiation treatments will cover the right chest wall as local regional nodal areas given her lymph node positivity at diagnosis.   60 minutes of total time was spent for this patient encounter, including preparation, face-to-face counseling with the patient and coordination of care, physical exam, and documentation of the encounter.   ------------------------------------------------  Blair Promise, PhD, MD  This document serves as a record of services personally performed by Gery Pray, MD. It was created on his behalf by Roney Mans, a trained medical scribe. The creation of this record is based on the scribe's personal observations and the provider's statements to them. This document has  been checked and approved by the attending provider.

## 2021-10-11 ENCOUNTER — Ambulatory Visit
Admission: RE | Admit: 2021-10-11 | Discharge: 2021-10-11 | Disposition: A | Discharge status: Home / Self Care | Payer: 59 | Source: Ambulatory Visit | Attending: Radiation Oncology | Admitting: Radiation Oncology

## 2021-10-11 ENCOUNTER — Encounter: Payer: Self-pay | Admitting: Radiation Oncology

## 2021-10-11 ENCOUNTER — Other Ambulatory Visit: Payer: Self-pay

## 2021-10-11 DIAGNOSIS — Z8589 Personal history of malignant neoplasm of other organs and systems: Secondary | ICD-10-CM | POA: Diagnosis not present

## 2021-10-11 DIAGNOSIS — Z79899 Other long term (current) drug therapy: Secondary | ICD-10-CM | POA: Diagnosis not present

## 2021-10-11 DIAGNOSIS — N63 Unspecified lump in unspecified breast: Secondary | ICD-10-CM

## 2021-10-11 DIAGNOSIS — Z87891 Personal history of nicotine dependence: Secondary | ICD-10-CM | POA: Diagnosis not present

## 2021-10-11 DIAGNOSIS — Z806 Family history of leukemia: Secondary | ICD-10-CM | POA: Insufficient documentation

## 2021-10-11 DIAGNOSIS — Z8042 Family history of malignant neoplasm of prostate: Secondary | ICD-10-CM | POA: Insufficient documentation

## 2021-10-11 DIAGNOSIS — Z170 Estrogen receptor positive status [ER+]: Secondary | ICD-10-CM

## 2021-10-11 DIAGNOSIS — Z7901 Long term (current) use of anticoagulants: Secondary | ICD-10-CM | POA: Insufficient documentation

## 2021-10-11 DIAGNOSIS — I82C12 Acute embolism and thrombosis of left internal jugular vein: Secondary | ICD-10-CM | POA: Diagnosis not present

## 2021-10-11 DIAGNOSIS — C50411 Malignant neoplasm of upper-outer quadrant of right female breast: Secondary | ICD-10-CM | POA: Diagnosis present

## 2021-10-11 HISTORY — DX: Malignant neoplasm of brain, unspecified: C71.9

## 2021-10-25 MED FILL — Dexamethasone Sodium Phosphate Inj 100 MG/10ML: INTRAMUSCULAR | Qty: 1 | Status: AC

## 2021-10-25 MED FILL — Fosaprepitant Dimeglumine For IV Infusion 150 MG (Base Eq): INTRAVENOUS | Qty: 5 | Status: AC

## 2021-10-25 NOTE — Progress Notes (Signed)
Patient Care Team: Dian Queen, MD as PCP - General (Obstetrics and Gynecology) Dian Queen, MD (Obstetrics and Gynecology) Mauro Kaufmann, RN as Oncology Nurse Navigator Rockwell Germany, RN as Oncology Nurse Navigator Nicholas Lose, MD as Consulting Physician (Hematology and Oncology)  DIAGNOSIS:    ICD-10-CM   1. Malignant neoplasm of upper-outer quadrant of right breast in female, estrogen receptor positive ([place])  C50.411    Z17.0       SUMMARY OF ONCOLOGIC HISTORY: Oncology History  Malignant neoplasm of upper-outer quadrant of right breast in female, estrogen receptor positive ([place])  06/23/2021 Initial Diagnosis   Palpable lump in the right breast. Diagnostic mammogram and [place]: a 3.8 cm mass at the retroareolar right breast, 1 prominent right axillary lymph node with a 4 mm cortex, 1 other which is borderline measuring 3 mm. 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  3 o'clock position: Biopsy fibroadenoma   07/12/2021 -  Neo-Adjuvant Chemotherapy   Neoadjuvant chemotherapy with TCH Perjeta 6 cycles followed by Herceptin Perjeta maintenance versus Kadcyla maintenance (based on response to neoadjuvant chemo) for 1 year   07/28/2021 Genetic Testing   Negative hereditary cancer genetic testing: no pathogenic variants detected in [facility] CancerNext-Expanded +RNAinsight Panel.  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  The report date is July 28, 2021.   The CancerNext-Expanded gene panel offered by [facility] and includes sequencing, rearrangement, and RNA analysis for the following 77 genes: AIP, ALK, APC, ATM, AXIN2, BAP1, BARD1, BLM, BMPR1A, BRCA1, BRCA2, BRIP1, CDC73, CDH1, CDK4, CDKN1B, CDKN2A, CHEK2, CTNNA1, DICER1, FANCC, FH, FLCN, GALNT12, KIF1B, LZTR1, MAX, MEN1, MET, MLH1, MSH2, MSH3, MSH6, MUTYH, NBN, NF1, NF2, NTHL1, PALB2, PHOX2B, PMS2, POT1, PRKAR1A,  PTCH1, PTEN, RAD51C, RAD51D, RB1, RECQL, RET, SDHA, SDHAF2, SDHB, SDHC, SDHD, SMAD4, SMARCA4, SMARCB1, SMARCE1, STK11, SUFU, TMEM127, TP53, TSC1, TSC2, VHL and XRCC2 (sequencing and deletion/duplication); EGFR, EGLN1, HOXB13, KIT, MITF, PDGFRA, POLD1, and POLE (sequencing only); EPCAM and GREM1 (deletion/duplication only).     Surgery   Bilateral Mastectomies with sentinel lymph node biopsy planned following chemotherapy    Radiation Therapy   Following surgery    Anti-estrogen oral therapy   To follow radiation     CHIEF COMPLIANT: Cycle 6 TCHP  INTERVAL HISTORY: Jenna Cross is a 36 y.o. with above-mentioned history of right breast cancer, currently on chemotherapy with TCHP. She presents to the clinic today for treatment.  She has noticed worsening fatigue after the last cycle of chemo.  The fatigue lasted the full 2 weeks.  She has intermittent diarrhea which is well controlled with Imodium.  Denies any nausea vomiting.  She is very anxious since chemo is complete whether or not she has a good result.  ALLERGIES:  has No Known Allergies.  MEDICATIONS:  Current Outpatient Medications  Medication Sig Dispense Refill   enoxaparin (LOVENOX) 60 MG/0.6ML injection Inject 0.6 mLs (60 mg total) into the skin every 12 (twelve) hours. 6 mL 0   rivaroxaban (XARELTO) 20 MG TABS tablet Take 1 tablet (20 mg total) by mouth daily with supper. 30 tablet 5   acetaminophen (TYLENOL) 500 MG tablet Take 1,000 mg by mouth every 6 (six) hours as needed for mild pain or headache.     cetirizine (ZYRTEC) 10 MG tablet Take 10 mg by mouth at bedtime. (Patient not taking: Reported on 10/11/2021)     clindamycin (CLINDAGEL) 1 % gel Apply topically 2 (two) times daily.  30 g 3   dexamethasone (DECADRON) 4 MG tablet TAKE 1 TABLET DAILY. TAKE 1 TABLET DAY BEFORE CHEMO AND 1 TABLET DAY AFTER CHEMO WITH FOOD 12 tablet 0   diphenoxylate-atropine (LOMOTIL) 2.5-0.025 MG tablet Take 1 tablet by mouth 3 (three) times  daily as needed for diarrhea or loose stools. 30 tablet 3   escitalopram (LEXAPRO) 20 MG tablet Take 20 mg by mouth daily.     ferrous sulfate 325 (65 FE) MG EC tablet Take 1 tablet (325 mg total) by mouth 2 (two) times daily. (Patient not taking: Reported on 10/11/2021) 60 tablet 2   ibuprofen (ADVIL) 600 MG tablet Take 1 tablet (600 mg total) by mouth every 6 (six) hours as needed. (Patient not taking: Reported on 10/11/2021) 30 tablet 0   lidocaine-prilocaine (EMLA) cream Apply to affected area once 30 g 3   loratadine (CLARITIN) 10 MG tablet Take 10 mg by mouth daily.     LORazepam (ATIVAN) 0.5 MG tablet TAKE 1 TABLET (0.5 MG TOTAL) BY MOUTH AT BEDTIME AS NEEDED (NAUSEA OR VOMITING). 30 tablet 0   magic mouthwash w/lidocaine SOLN Take 5 mLs by mouth 3 (three) times daily as needed for mouth pain. 100 mL 0   ondansetron (ZOFRAN) 8 MG tablet Take 1 tablet (8 mg total) by mouth 2 (two) times daily as needed (Nausea or vomiting). Start on the third day after chemotherapy. 30 tablet 1   prochlorperazine (COMPAZINE) 10 MG tablet Take 1 tablet (10 mg total) by mouth every 6 (six) hours as needed (Nausea or vomiting). 30 tablet 1   traMADol (ULTRAM) 50 MG tablet Take 2 tablets (100 mg total) by mouth every 6 (six) hours as needed. 10 tablet 0   valACYclovir (VALTREX) 500 MG tablet Take 500 mg by mouth 2 (two) times daily.     No current facility-administered medications for this visit.   Facility-Administered Medications Ordered in Other Visits  Medication Dose Route Frequency Provider Last Rate Last Admin   CARBOplatin (PARAPLATIN) 700 mg in sodium chloride 0.9 % 250 mL chemo infusion  700 mg Intravenous Once Nicholas Lose, MD       DOCEtaxel (TAXOTERE) 130 mg in sodium chloride 0.9 % 250 mL chemo infusion  75 mg/m2 (Treatment Plan Recorded) Intravenous Once Nicholas Lose, MD       fosaprepitant (EMEND) 150 mg in sodium chloride 0.9 % 145 mL IVPB  150 mg Intravenous Once Nicholas Lose, MD 450 mL/hr at  10/26/21 0954 150 mg at 10/26/21 0954   heparin lock flush 100 unit/mL  500 Units Intracatheter Once PRN Nicholas Lose, MD       pertuzumab (PERJETA) 420 mg in sodium chloride 0.9 % 250 mL chemo infusion  420 mg Intravenous Once Nicholas Lose, MD       sodium chloride flush (NS) 0.9 % injection 10 mL  10 mL Intracatheter PRN Nicholas Lose, MD       trastuzumab-anns (KANJINTI) 378 mg in sodium chloride 0.9 % 250 mL chemo infusion  6 mg/kg (Treatment Plan Recorded) Intravenous Once Nicholas Lose, MD        PHYSICAL EXAMINATION: ECOG PERFORMANCE STATUS: 1 - Symptomatic but completely ambulatory  Vitals:   10/26/21 0843  BP: 124/62  Pulse: 92  Resp: 18  Temp: (!) 97.3 F (36.3 C)  SpO2: 99%   Filed Weights   10/26/21 0843  Weight: 137 lb 8 oz (62.4 kg)      LABORATORY DATA:  I have reviewed the data as listed  CMP Latest Ref Rng & Units 10/26/2021 10/05/2021 10/02/2021  Glucose 70 - 99 mg/dL 90 122(H) 104(H)  BUN 6 - 20 mg/dL 13 8 8   Creatinine 0.44 - 1.00 mg/dL 0.57 0.58 0.73  Sodium 135 - 145 mmol/L 140 140 137  Potassium 3.5 - 5.1 mmol/L 3.4(L) 4.0 3.4(L)  Chloride 98 - 111 mmol/L 108 108 106  CO2 22 - 32 mmol/L 26 25 25   Calcium 8.9 - 10.3 mg/dL 9.5 9.0 8.6(L)  Total Protein 6.5 - 8.1 g/dL 6.5 6.9 -  Total Bilirubin 0.3 - 1.2 mg/dL 0.4 0.3 -  Alkaline Phos 38 - 126 U/L 96 95 -  AST 15 - 41 U/L 12(L) 11(L) -  ALT 0 - 44 U/L 12 10 -    Lab Results  Component Value Date   WBC 7.8 10/26/2021   HGB 10.3 (L) 10/26/2021   HCT 29.5 (L) 10/26/2021   MCV 101.7 (H) 10/26/2021   PLT 126 (L) 10/26/2021   NEUTROABS 5.1 10/26/2021    ASSESSMENT & PLAN:  Malignant neoplasm of upper-outer quadrant of right breast in female, estrogen receptor positive (HCC) Palpable lump in the right breast. Diagnostic mammogram and [place]: a 3.8 cm mass at the retroareolar right breast, 1 prominent right axillary lymph node with a 4 mm cortex, 1 other which is borderline measuring 3 mm. Biopsy:  invasive lobular carcinoma with metastatic carcinoma involving one lymph node, ER+(95%)/PR+(85%)/Her2+ (3+).  3 o'clock position: Biopsy fibroadenoma   Treatment plan: 1. Neoadjuvant chemotherapy with TCH Perjeta 6 cycles followed by Herceptin Perjeta maintenance versus Kadcyla maintenance (based on response to neoadjuvant chemo) for 1 year 2. Followed by bilateral mastectomies with sentinel lymph node study 3. Followed by adjuvant radiation therapy  4.  Followed by antiestrogen therapy 5.  Followed by neratinib   Breast MRI 06/28/2021: Confluent mass right breast 11.1 x 7.8 x 9.5 cm, 1 enlarged lymph node and second borderline lymph node in the right axilla Brain MRI 07/03/2021: No evidence of malignancy. CT CAP and bone scan: 07/10/2021: Numerous enhancing right breast masses and right axillary lymph nodes, nonspecific small pulmonary nodules, bone scan: No bone metastases URCC nausea study, ------------------------------------------------------------------------------------------------------------------------------------ Current treatment: TCHP started 07/12/2021 (Dignicap), today cycle 6 Echocardiogram 07/07/2021: EF 55 to 60% Chemo toxicities: Sores on the lips: Resolved Fatigue.  It appears to be getting worse Mild nausea Diarrhea.  Managed well with Lomotil.   Breast MRI has been scheduled for tomorrow. She has an appointment with surgery to discuss surgical planning. 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  I gave her a prescription for Lovenox to be taken perioperatively.  She will come back in 3 weeks for Herceptin Perjeta treatment. Return to clinic after surgery to see me to discuss the final pathology report and to determine if she was going to go on Herceptin Perjeta maintenance versus Kadcyla maintenance.  No orders of the defined types were placed in this encounter.  The patient has a good understanding  of the overall plan. she agrees with it. she will call with any problems that may develop before the next visit here.  Total time spent: 30 mins including face to face time and time spent for planning, charting and coordination of care  Rulon Eisenmenger, MD, MPH 10/26/2021  I, Thana Ates, am acting as scribe for Dr. Nicholas Lose.  I have reviewed the above documentation for accuracy and completeness, and I agree with the above.

## 2021-10-25 NOTE — Assessment & Plan Note (Signed)
Palpable lump in the right breast. Diagnostic mammogram and [place]: a 3.8 cm mass at the retroareolar right breast, 1 prominent right axillary lymph node with a 4 mm cortex, 1 other which is borderline measuring 3 mm. 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 3 o'clock position: Biopsy fibroadenoma  Treatment plan: 1. Neoadjuvant chemotherapy with TCH Perjeta 6 cycles followed by Herceptin Perjeta maintenance versus Kadcyla maintenance (based on response to neoadjuvant chemo) for 1 year 2. Followed bybilateral mastectomieswith sentinel lymph node study 3. Followed by adjuvant radiation therapy 4.Followed by antiestrogen therapy 5.Followed by neratinib  Breast MRI 06/28/2021: Confluent mass right breast 11.1 x 7.8 x 9.5 cm, 1 enlarged lymph node and second borderline lymph node in the right axilla Brain MRI 07/03/2021: No evidence of malignancy. CT CAP and bone scan:07/10/2021: Numerous enhancing right breast masses and right axillary lymph nodes, nonspecific small pulmonary nodules,bone scan: No bone metastases URCC nausea study, ------------------------------------------------------------------------------------------------------------------------------------ Current treatment:CTCHP started10/02/2021 (Dignicap) Echocardiogram 07/07/2021: EF 55 to 60% Chemo toxicities: 1. Sores on the lips: Resolved 2. Fatigue.  This is improved from previous weeks. 3. Mild nausea 4. Diarrhea.  Managed well with Lomotil.  Minna Merritts has a new diagnosis of DVT.  She is very concerned about this.  She met with Dr. Lindi Adie to discuss further due to her anxiety level.  I sent in a Xarelto starter pack for her to take she will take Xarelto 15 mg p.o. twice daily x3 weeks followed by Xarelto 20 mg daily.

## 2021-10-26 ENCOUNTER — Telehealth: Payer: Self-pay | Admitting: *Deleted

## 2021-10-26 ENCOUNTER — Other Ambulatory Visit: Payer: Self-pay

## 2021-10-26 ENCOUNTER — Inpatient Hospital Stay: Payer: 59 | Admitting: Hematology and Oncology

## 2021-10-26 ENCOUNTER — Inpatient Hospital Stay: Payer: 59 | Attending: Hematology and Oncology

## 2021-10-26 ENCOUNTER — Inpatient Hospital Stay: Payer: 59

## 2021-10-26 ENCOUNTER — Encounter: Payer: Self-pay | Admitting: *Deleted

## 2021-10-26 VITALS — BP 120/70 | HR 65 | Temp 98.0°F | Resp 18

## 2021-10-26 DIAGNOSIS — Z5111 Encounter for antineoplastic chemotherapy: Secondary | ICD-10-CM | POA: Diagnosis present

## 2021-10-26 DIAGNOSIS — Z170 Estrogen receptor positive status [ER+]: Secondary | ICD-10-CM

## 2021-10-26 DIAGNOSIS — C50411 Malignant neoplasm of upper-outer quadrant of right female breast: Secondary | ICD-10-CM | POA: Insufficient documentation

## 2021-10-26 DIAGNOSIS — Z5189 Encounter for other specified aftercare: Secondary | ICD-10-CM | POA: Diagnosis not present

## 2021-10-26 DIAGNOSIS — Z5112 Encounter for antineoplastic immunotherapy: Secondary | ICD-10-CM | POA: Diagnosis present

## 2021-10-26 DIAGNOSIS — Z95828 Presence of other vascular implants and grafts: Secondary | ICD-10-CM

## 2021-10-26 LAB — PREGNANCY, URINE: Preg Test, Ur: NEGATIVE

## 2021-10-26 LAB — CBC WITH DIFFERENTIAL (CANCER CENTER ONLY)
Abs Immature Granulocytes: 0.03 10*3/uL (ref 0.00–0.07)
Basophils Absolute: 0 10*3/uL (ref 0.0–0.1)
Basophils Relative: 0 %
Eosinophils Absolute: 0 10*3/uL (ref 0.0–0.5)
Eosinophils Relative: 0 %
HCT: 29.5 % — ABNORMAL LOW (ref 36.0–46.0)
Hemoglobin: 10.3 g/dL — ABNORMAL LOW (ref 12.0–15.0)
Immature Granulocytes: 0 %
Lymphocytes Relative: 28 %
Lymphs Abs: 2.2 10*3/uL (ref 0.7–4.0)
MCH: 35.5 pg — ABNORMAL HIGH (ref 26.0–34.0)
MCHC: 34.9 g/dL (ref 30.0–36.0)
MCV: 101.7 fL — ABNORMAL HIGH (ref 80.0–100.0)
Monocytes Absolute: 0.6 10*3/uL (ref 0.1–1.0)
Monocytes Relative: 7 %
Neutro Abs: 5.1 10*3/uL (ref 1.7–7.7)
Neutrophils Relative %: 65 %
Platelet Count: 126 10*3/uL — ABNORMAL LOW (ref 150–400)
RBC: 2.9 MIL/uL — ABNORMAL LOW (ref 3.87–5.11)
RDW: 15 % (ref 11.5–15.5)
WBC Count: 7.8 10*3/uL (ref 4.0–10.5)
nRBC: 0 % (ref 0.0–0.2)

## 2021-10-26 LAB — CMP (CANCER CENTER ONLY)
ALT: 12 U/L (ref 0–44)
AST: 12 U/L — ABNORMAL LOW (ref 15–41)
Albumin: 4.1 g/dL (ref 3.5–5.0)
Alkaline Phosphatase: 96 U/L (ref 38–126)
Anion gap: 6 (ref 5–15)
BUN: 13 mg/dL (ref 6–20)
CO2: 26 mmol/L (ref 22–32)
Calcium: 9.5 mg/dL (ref 8.9–10.3)
Chloride: 108 mmol/L (ref 98–111)
Creatinine: 0.57 mg/dL (ref 0.44–1.00)
GFR, Estimated: >60 mL/min (ref >60)
Glucose, Bld: 90 mg/dL (ref 70–99)
Potassium: 3.4 mmol/L — ABNORMAL LOW (ref 3.5–5.1)
Sodium: 140 mmol/L (ref 135–145)
Total Bilirubin: 0.4 mg/dL (ref 0.3–1.2)
Total Protein: 6.5 g/dL (ref 6.5–8.1)

## 2021-10-26 MED ORDER — TRASTUZUMAB-ANNS CHEMO 150 MG IV SOLR
6.0000 mg/kg | Freq: Once | INTRAVENOUS | Status: AC
  Administered 2021-10-26: 378 mg via INTRAVENOUS
  Filled 2021-10-26: qty 18

## 2021-10-26 MED ORDER — PALONOSETRON HCL INJECTION 0.25 MG/5ML
0.2500 mg | Freq: Once | INTRAVENOUS | Status: AC
  Administered 2021-10-26: 0.25 mg via INTRAVENOUS
  Filled 2021-10-26: qty 5

## 2021-10-26 MED ORDER — ACETAMINOPHEN 325 MG PO TABS
650.0000 mg | ORAL_TABLET | Freq: Once | ORAL | Status: AC
  Administered 2021-10-26: 650 mg via ORAL
  Filled 2021-10-26: qty 2

## 2021-10-26 MED ORDER — SODIUM CHLORIDE 0.9 % IV SOLN
75.0000 mg/m2 | Freq: Once | INTRAVENOUS | Status: AC
  Administered 2021-10-26: 130 mg via INTRAVENOUS
  Filled 2021-10-26: qty 13

## 2021-10-26 MED ORDER — SODIUM CHLORIDE 0.9 % IV SOLN
10.0000 mg | Freq: Once | INTRAVENOUS | Status: AC
  Administered 2021-10-26: 10 mg via INTRAVENOUS
  Filled 2021-10-26: qty 10

## 2021-10-26 MED ORDER — SODIUM CHLORIDE 0.9 % IV SOLN
150.0000 mg | Freq: Once | INTRAVENOUS | Status: AC
  Administered 2021-10-26: 150 mg via INTRAVENOUS
  Filled 2021-10-26: qty 150

## 2021-10-26 MED ORDER — SODIUM CHLORIDE 0.9% FLUSH
10.0000 mL | INTRAVENOUS | Status: AC | PRN
  Administered 2021-10-26: 10 mL

## 2021-10-26 MED ORDER — HEPARIN SOD (PORK) LOCK FLUSH 100 UNIT/ML IV SOLN
500.0000 [IU] | Freq: Once | INTRAVENOUS | Status: AC | PRN
  Administered 2021-10-26: 500 [IU]

## 2021-10-26 MED ORDER — SODIUM CHLORIDE 0.9 % IV SOLN
420.0000 mg | Freq: Once | INTRAVENOUS | Status: AC
  Administered 2021-10-26: 420 mg via INTRAVENOUS
  Filled 2021-10-26: qty 14

## 2021-10-26 MED ORDER — SODIUM CHLORIDE 0.9% FLUSH
10.0000 mL | INTRAVENOUS | Status: DC | PRN
  Administered 2021-10-26: 10 mL

## 2021-10-26 MED ORDER — SODIUM CHLORIDE 0.9 % IV SOLN: Freq: Once | INTRAVENOUS | Status: AC

## 2021-10-26 MED ORDER — RIVAROXABAN 20 MG PO TABS: 20.0000 mg | ORAL_TABLET | Freq: Every day | ORAL | 5 refills | Status: DC

## 2021-10-26 MED ORDER — SODIUM CHLORIDE 0.9 % IV SOLN
700.0000 mg | Freq: Once | INTRAVENOUS | Status: AC
  Administered 2021-10-26: 700 mg via INTRAVENOUS
  Filled 2021-10-26: qty 70

## 2021-10-26 MED ORDER — DIPHENHYDRAMINE HCL 25 MG PO CAPS
25.0000 mg | ORAL_CAPSULE | Freq: Once | ORAL | Status: AC
  Administered 2021-10-26: 25 mg via ORAL
  Filled 2021-10-26: qty 1

## 2021-10-26 MED ORDER — ENOXAPARIN SODIUM 60 MG/0.6ML IJ SOSY: 60.0000 mg | PREFILLED_SYRINGE | Freq: Two times a day (BID) | INTRAMUSCULAR | 0 refills | Status: DC

## 2021-10-26 NOTE — Patient Instructions (Signed)
[facility]  Discharge Instructions: Thank you for choosing [facility] to provide your oncology and hematology care.   If you have a lab appointment with the Cancer Center, please go directly to the Cancer Center and check in at the registration area.   Wear comfortable clothing and clothing appropriate for easy access to any Portacath or PICC line.   We strive to give you quality time with your provider. You may need to reschedule your appointment if you arrive late (15 or more minutes).  Arriving late affects you and other patients whose appointments are after yours.  Also, if you miss three or more appointments without notifying the office, you may be dismissed from the clinic at the provider's discretion.      For prescription refill requests, have your pharmacy contact our office and allow 72 hours for refills to be completed.    Today you received the following chemotherapy and/or immunotherapy agents: Trastuzumab, Pertuzumab (Perjeta), Docetaxel (Taxotere), and Carboplatin.   To help prevent nausea and vomiting after your treatment, we encourage you to take your nausea medication as directed.  BELOW ARE SYMPTOMS THAT SHOULD BE REPORTED IMMEDIATELY: *FEVER GREATER THAN 100.4 F (38 C) OR HIGHER *CHILLS OR SWEATING *NAUSEA AND VOMITING THAT IS NOT CONTROLLED WITH YOUR NAUSEA MEDICATION *UNUSUAL SHORTNESS OF BREATH *UNUSUAL BRUISING OR BLEEDING *URINARY PROBLEMS (pain or burning when urinating, or frequent urination) *BOWEL PROBLEMS (unusual diarrhea, constipation, pain near the anus) TENDERNESS IN MOUTH AND THROAT WITH OR WITHOUT PRESENCE OF ULCERS (sore throat, sores in mouth, or a toothache) UNUSUAL RASH, SWELLING OR PAIN  UNUSUAL VAGINAL DISCHARGE OR ITCHING   Items with * indicate a potential emergency and should be followed up as soon as possible or go to the Emergency Department if any problems should occur.  Please show the  CHEMOTHERAPY ALERT CARD or IMMUNOTHERAPY ALERT CARD at check-in to the Emergency Department and triage nurse.  Should you have questions after your visit or need to cancel or reschedule your appointment, please contact [facility]  Dept: [phone]  and follow the prompts.  Office hours are 8:00 a.m. to 4:30 p.m. Monday - Friday. Please note that voicemails left after 4:00 p.m. may not be returned until the following business day.  We are closed weekends and major holidays. You have access to a nurse at all times for urgent questions. Please call the main number to the clinic Dept: [phone] and follow the prompts.   For any non-urgent questions, you may also contact your provider using MyChart. We now offer e-Visits for anyone 18 and older to request care online for non-urgent symptoms. For details visit [email or web address].   Also download the MyChart app! Go to the app store, search "MyChart", open the app, select [facility], and log in with your MyChart username and password.  Due to Covid, a mask is required upon entering the hospital/clinic. If you do not have a mask, one will be given to you upon arrival. For doctor visits, patients may have 1 support person aged 18 or older with them. For treatment visits, patients cannot have anyone with them due to current Covid guidelines and our immunocompromised population.   

## 2021-10-26 NOTE — Telephone Encounter (Signed)
Received appt with Dr. Donne Hazel for 11/09/21 arrive at 2:15pm. Notified pt of appt date and time.

## 2021-10-27 ENCOUNTER — Ambulatory Visit
Admission: RE | Admit: 2021-10-27 | Discharge: 2021-10-27 | Disposition: A | Discharge status: Home / Self Care | Payer: 59 | Source: Ambulatory Visit | Attending: Adult Health | Admitting: Adult Health

## 2021-10-27 ENCOUNTER — Other Ambulatory Visit: Payer: Self-pay | Admitting: Hematology and Oncology

## 2021-10-27 DIAGNOSIS — Z170 Estrogen receptor positive status [ER+]: Secondary | ICD-10-CM

## 2021-10-27 DIAGNOSIS — C50411 Malignant neoplasm of upper-outer quadrant of right female breast: Secondary | ICD-10-CM

## 2021-10-27 IMAGING — MR MR BREAST BILAT WO/W CM
8 of 12 series · 32 of 48 positions shown · IV contrast (gadavist)
Comparison: Previous exams, including pretreatment Breast MRI
[DATE].

CLINICAL DATA: 35-year-old with extensive diffuse multicentric
triple negative RIGHT breast cancer and metastatic disease to a
RIGHT axillary lymph node diagnosed in [DATE]. She also had
benign core needle biopsies of the LEFT breast at that time,
pathology revealing 2 fibroadenomas. She recently completed
neoadjuvant chemotherapy. MRI is performed to evaluate response to
treatment.

EXAM:
BILATERAL BREAST MRI WITH AND WITHOUT CONTRAST
TECHNIQUE: Multiplanar, multisequence MR images of both breasts were obtained
prior to and following the intravenous administration of 6 ml of
Gadavist.

[Series 2: t2_tirm_tra ipat (a-p) · axial · 3.0mm · 0.70mm/px · 1 of 59 slices shown]
[im 1/59]
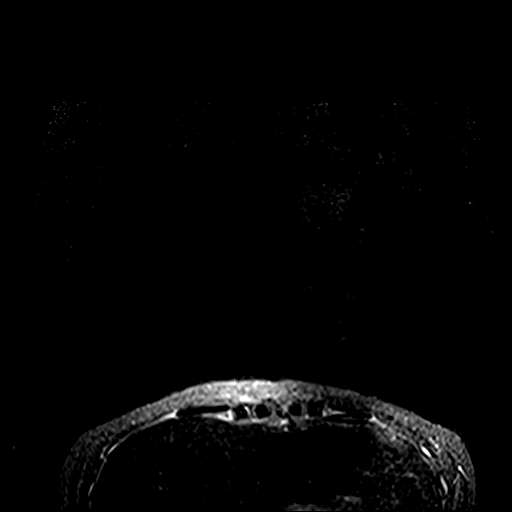

[Series 3: fl3d pre-cm no · axial · non-contrast · 1.2mm · 0.94mm/px · z∈[-108,+82]mm · 5 of 160 slices shown]
[im 1/160]
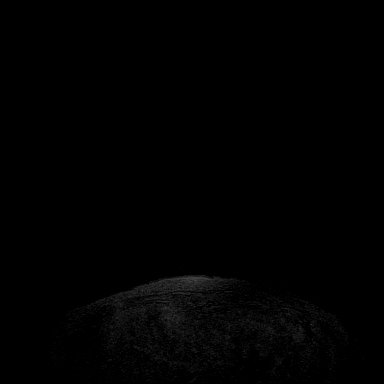
[im 40/160]
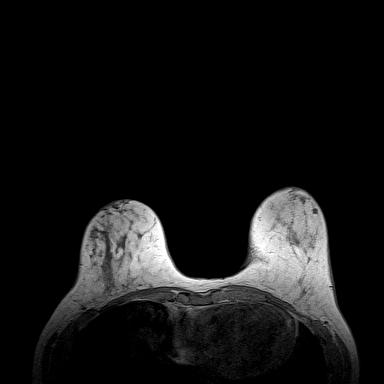
[im 80/160]
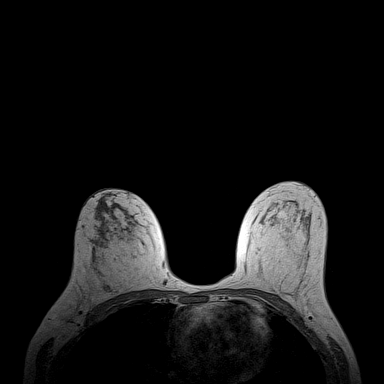
[im 120/160]
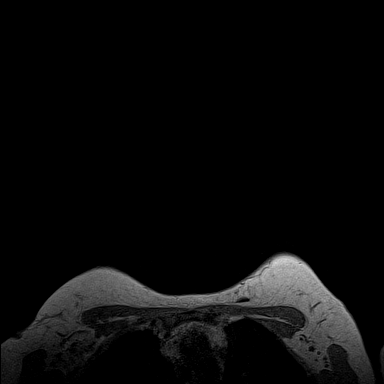
[im 160/160]
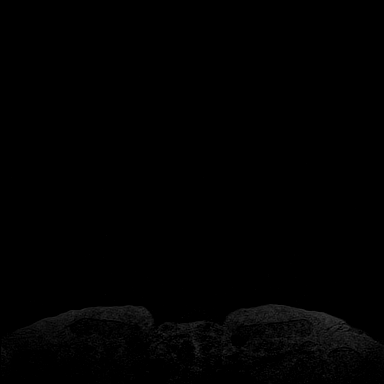

[Series 4: fl3d pre-cm · axial · non-contrast · 1.2mm · 0.94mm/px · z∈[-108,+82]mm · 5 of 160 slices shown]
[im 1/160]
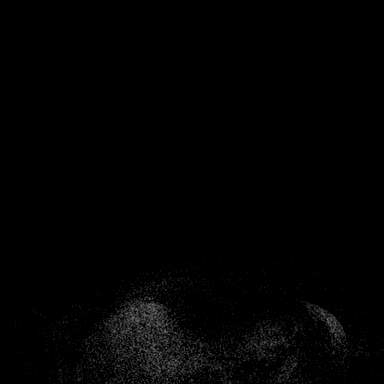
[im 40/160]
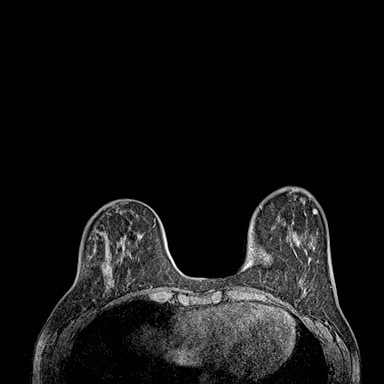
[im 80/160]
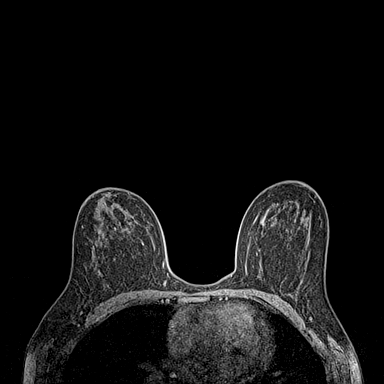
[im 120/160]
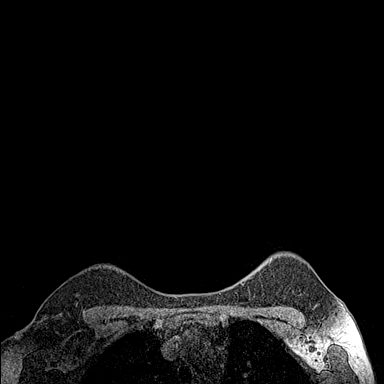
[im 160/160]
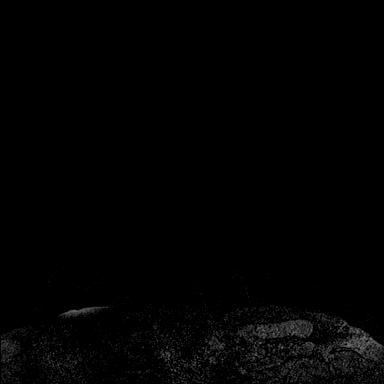

[Series 5: fl3d post-cm 20 · axial · 1.2mm · 0.94mm/px · z∈[-108,+82]mm · 5 of 160 slices shown (1 of 3)]
[im 1/160]
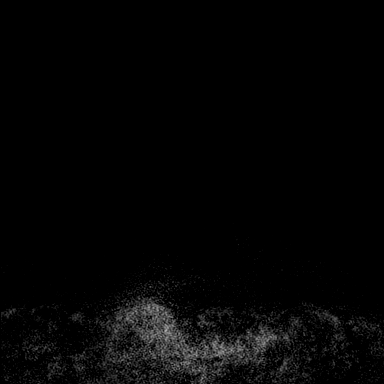
[im 40/160]
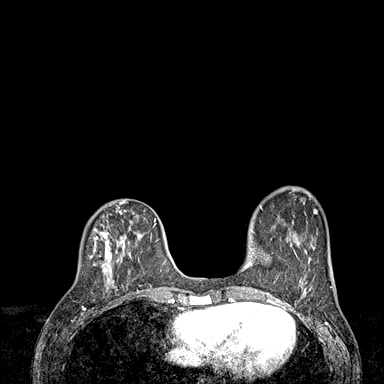
[im 80/160]
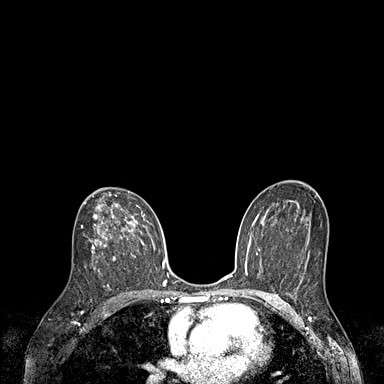
[im 120/160]
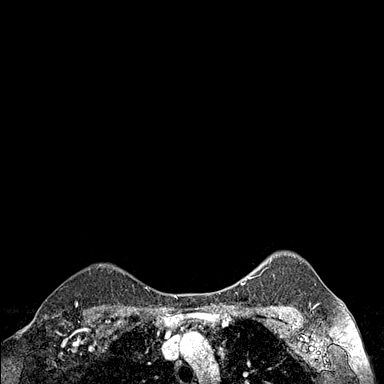
[im 160/160]
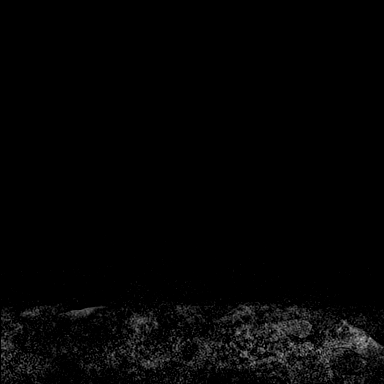

[Series 6: fl3d post-cm 20 · axial · 1.2mm · 0.94mm/px · z∈[-108,+82]mm · 5 of 160 slices shown (2 of 3)]
[im 1/160]
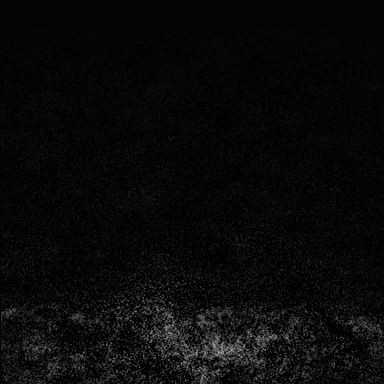
[im 40/160]
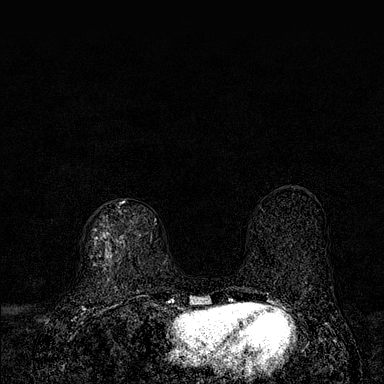
[im 80/160]
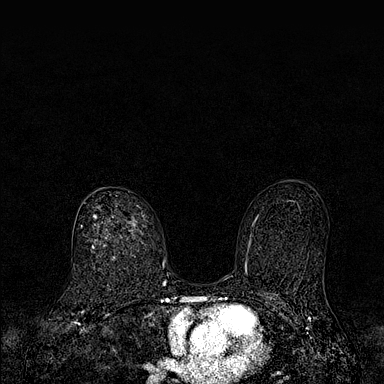
[im 120/160]
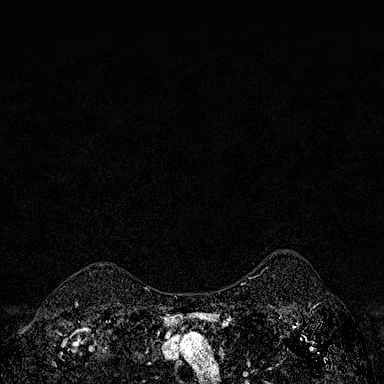
[im 160/160]
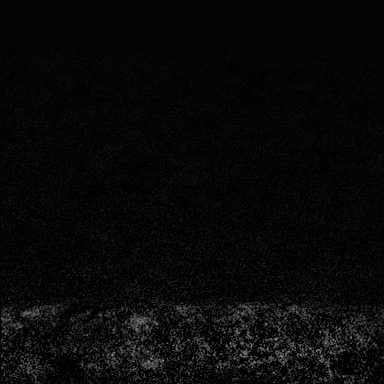

[Series 7: fl3d post-cm 20 · axial · 192.0mm · 0.94mm/px · 1 of 1 slices shown (3 of 3)]
[im 1/1]
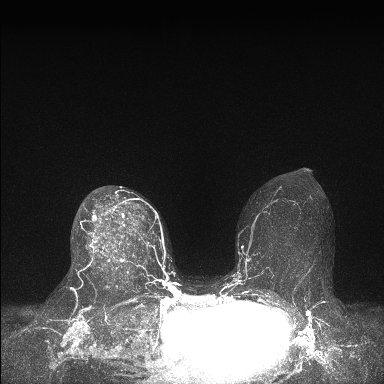

[Series 8: fl3d post-cm 3 · axial · 1.2mm · 0.94mm/px · z∈[-108,+82]mm · 6 of 160 slices shown (1 of 2)]
[im 1/160]
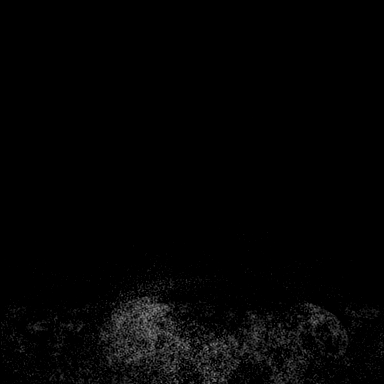
[im 32/160]
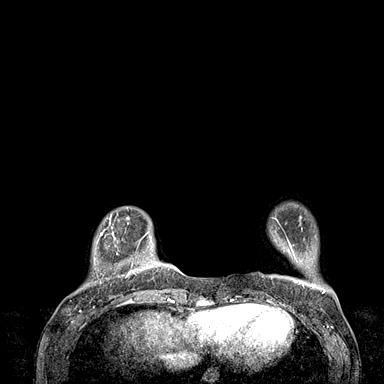
[im 64/160]
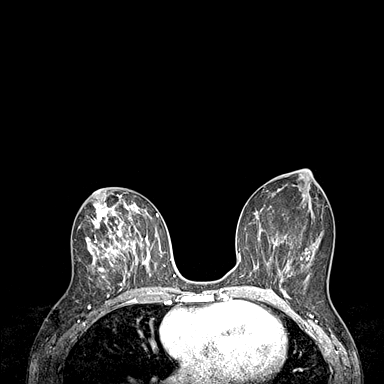
[im 96/160]
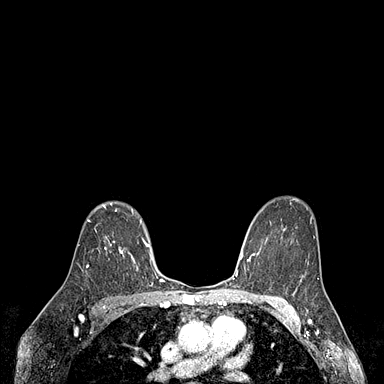
[im 128/160]
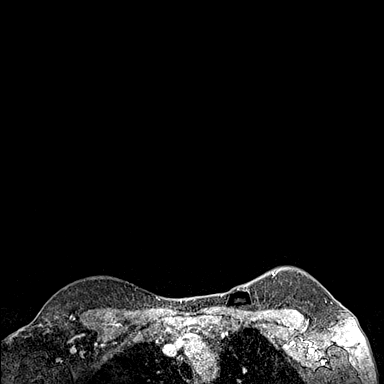
[im 160/160]
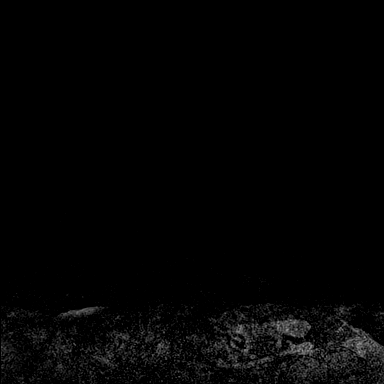

[Series 9: fl3d post-cm 3 · axial · 1.2mm · 0.94mm/px · z∈[-108,+6]mm · 4 of 160 slices shown (2 of 2)]
[im 1/160]
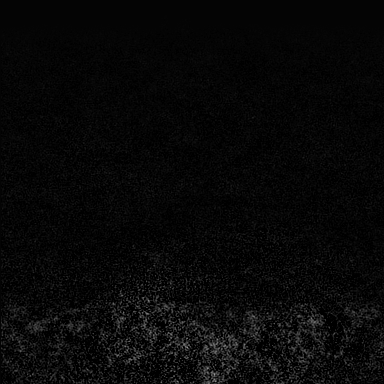
[im 32/160]
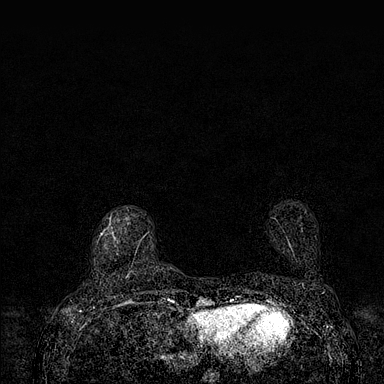
[im 64/160]
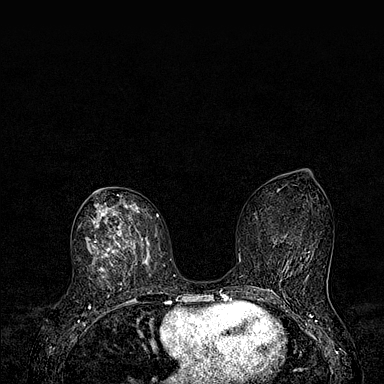
[im 96/160]
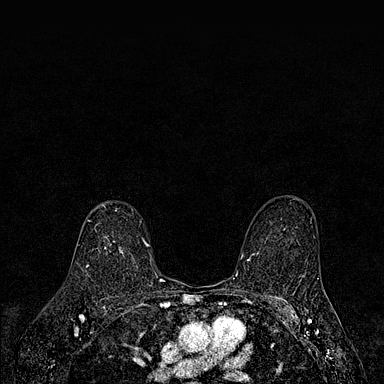

[32 of 48 positions shown; findings below may reference images not displayed]

Three-dimensional MR images were rendered by post-processing of the
original MR data on an independent workstation. The
three-dimensional MR images were interpreted, and findings are
reported in the following complete MRI report for this study. Three
dimensional images were evaluated at the independent interpreting
workstation using the DynaCAD thin client.
FINDINGS: Breast composition: c. Heterogeneous fibroglandular tissue.

Background parenchymal enhancement: Minimal to mild.

Right breast: Numerous residual small enhancing masses throughout
all 4 quadrants of the breast, though the masses have significantly
decreased in size since the [DATE] MRI, and there is much less
enhancement of the residual masses. No enlarging masses are
identified. Blooming artifact from the tissue marking clips placed
at the time of biopsy in the subareolar location and the outer
breast.

Left breast: No suspicious mass or abnormal enhancement. Blooming
artifact from the tissue marking clips placed time of benign
biopsies at the 3 o'clock position and in the inner breast at the 9
o'clock position.

Lymph nodes: The biopsy-proven metastatic lymph node at level 1 in
the RIGHT axilla has normalized. The adjacent level 1 node with
cortical thickening previously has also normalized, with significant
decrease in the cortical thickening.

Ancillary findings: Port-A-Cath port in the LEFT chest wall without
complications.
IMPRESSION: 1. Good response to neoadjuvant chemotherapy, though there are
numerous residual persistent enhancing small masses throughout all 4
quadrants of the RIGHT breast.
2. The biopsy-proven metastatic level 1 RIGHT axillary lymph node
and the adjacent questionable lymph node have both normalized.
3. No MRI evidence of malignancy involving the LEFT breast.

RECOMMENDATION:
Treatment plan.

BI-RADS CATEGORY  6: Known biopsy-proven malignancy.

## 2021-10-27 MED ORDER — GADOBUTROL 1 MMOL/ML IV SOLN
6.0000 mL | Freq: Once | INTRAVENOUS | Status: AC | PRN
  Administered 2021-10-27: 6 mL via INTRAVENOUS

## 2021-10-28 ENCOUNTER — Inpatient Hospital Stay: Payer: 59

## 2021-10-28 ENCOUNTER — Other Ambulatory Visit: Payer: Self-pay

## 2021-10-28 VITALS — BP 112/61 | HR 90 | Temp 97.2°F | Resp 16

## 2021-10-28 DIAGNOSIS — Z5111 Encounter for antineoplastic chemotherapy: Secondary | ICD-10-CM | POA: Diagnosis not present

## 2021-10-28 DIAGNOSIS — C50411 Malignant neoplasm of upper-outer quadrant of right female breast: Secondary | ICD-10-CM

## 2021-10-28 MED ORDER — PEGFILGRASTIM-BMEZ 6 MG/0.6ML ~~LOC~~ SOSY
6.0000 mg | PREFILLED_SYRINGE | Freq: Once | SUBCUTANEOUS | Status: AC
  Administered 2021-10-28: 6 mg via SUBCUTANEOUS

## 2021-10-30 ENCOUNTER — Encounter: Payer: Self-pay | Admitting: *Deleted

## 2021-10-30 ENCOUNTER — Telehealth: Payer: Self-pay | Admitting: Hematology and Oncology

## 2021-10-30 ENCOUNTER — Encounter: Payer: Self-pay | Admitting: Hematology and Oncology

## 2021-10-30 NOTE — Telephone Encounter (Signed)
I discussed the breast MRI report with the patient showing a dramatic response to treatment. There are few isolated nodules in multiple quadrants but it is significantly improved compared to before. I will see her back after surgery to discuss the final pathology report.

## 2021-10-31 ENCOUNTER — Other Ambulatory Visit: Payer: Self-pay

## 2021-10-31 ENCOUNTER — Ambulatory Visit (INDEPENDENT_AMBULATORY_CARE_PROVIDER_SITE_OTHER): Payer: 59 | Admitting: Plastic Surgery

## 2021-10-31 DIAGNOSIS — Z170 Estrogen receptor positive status [ER+]: Secondary | ICD-10-CM | POA: Diagnosis not present

## 2021-10-31 DIAGNOSIS — C50411 Malignant neoplasm of upper-outer quadrant of right female breast: Secondary | ICD-10-CM | POA: Diagnosis not present

## 2021-10-31 NOTE — Progress Notes (Signed)
° °  Subjective:    Patient ID: Jenna Cross, female    DOB: 1986/02/28, 36 y.o.   MRN: [id number]  HPI  The patient is a 36 year old female here for further discussion about her breast reconstruction.  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   She is 5 feet 4 inches tall and is approximately 148 pounds.  Her sternal notch to nipple distance is 27 cm on both sides.  She has finished chemotherapy and her Port-A-Cath is on the left side.  She is planning on having postop radiation.  She does have some grade 2-3 ptosis and does not particularly want that after the surgery.  Review of Systems  Constitutional: Negative.   Eyes: Negative.   Respiratory: Negative.    Cardiovascular: Negative.   Gastrointestinal: Negative.   Endocrine: Negative.   Genitourinary: Negative.   Neurological: Negative.   Hematological: Negative.       Objective:   Physical Exam Constitutional:      Appearance: Normal appearance.  Cardiovascular:     Rate and Rhythm: Normal rate.     Pulses: Normal pulses.  Pulmonary:     Effort: Pulmonary effort is normal.  Abdominal:     Palpations: Abdomen is soft.  Musculoskeletal:        General: No swelling.  Skin:    General: Skin is warm.     Coloration: Skin is not jaundiced.     Findings: No bruising.  Neurological:     Mental Status: She is alert and oriented to person, place, and time.       Assessment & Plan:     ICD-10-CM   1. Malignant neoplasm of upper-outer quadrant of right breast in female, estrogen receptor positive ([place])  C50.411    Z17.0       Pictures were obtained of the patient and placed in the chart with the patient's or guardian's permission.  We had a long discussion about the options for reconstruction with specifics on pre-clinic and subpectoral implant and expander placement.  Her main concern is to get exchanged as quickly as possible into the implants.  She does  not want to be ptotic.  We talked about salvaging the nipples versus taking them.  Upon further discussion I think it would be best to go ahead and plan on not salvaging the nipples because we will be able to brace them later.  This would cause her to have a double bubble.  We can plan on a prepectoral approach and not salvage the NAC.  11/02/2021 Spoke with the patient and her father on the phone and confirmed the above.

## 2021-11-01 ENCOUNTER — Other Ambulatory Visit: Payer: Self-pay | Admitting: General Surgery

## 2021-11-01 ENCOUNTER — Encounter: Payer: Self-pay | Admitting: *Deleted

## 2021-11-01 DIAGNOSIS — Z170 Estrogen receptor positive status [ER+]: Secondary | ICD-10-CM

## 2021-11-02 ENCOUNTER — Other Ambulatory Visit: Payer: Self-pay | Admitting: General Surgery

## 2021-11-02 DIAGNOSIS — Z170 Estrogen receptor positive status [ER+]: Secondary | ICD-10-CM

## 2021-11-07 ENCOUNTER — Encounter: Payer: Self-pay | Admitting: Hematology and Oncology

## 2021-11-15 ENCOUNTER — Telehealth: Payer: Self-pay | Admitting: Hematology and Oncology

## 2021-11-15 NOTE — Progress Notes (Signed)
Patient Care Team: Dian Queen, MD as PCP - General (Obstetrics and Gynecology) Dian Queen, MD (Obstetrics and Gynecology) Mauro Kaufmann, RN as Oncology Nurse Navigator Rockwell Germany, RN as Oncology Nurse Navigator Nicholas Lose, MD as Consulting Physician (Hematology and Oncology) Gery Pray, MD as Consulting Physician (Radiation Oncology) Rolm Bookbinder, MD as Consulting Physician (General Surgery)  DIAGNOSIS:    ICD-10-CM   1. Malignant neoplasm of upper-outer quadrant of right breast in female, estrogen receptor positive ([facility])  C50.411    Z17.0       SUMMARY OF ONCOLOGIC HISTORY: Oncology History  Malignant neoplasm of upper-outer quadrant of right breast in female, estrogen receptor positive ([facility])  06/23/2021 Initial Diagnosis   Palpable lump in the right breast. Diagnostic mammogram and [place]: a 3.8 cm mass at the retroareolar right breast, 1 prominent right axillary lymph node with a 4 mm cortex, 1 other which is borderline measuring 3 mm. 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  3 o'clock position: Biopsy fibroadenoma   07/12/2021 -  Neo-Adjuvant Chemotherapy   Neoadjuvant chemotherapy with TCH Perjeta 6 cycles followed by Herceptin Perjeta maintenance versus Kadcyla maintenance (based on response to neoadjuvant chemo) for 1 year   07/28/2021 Genetic Testing   Negative hereditary cancer genetic testing: no pathogenic variants detected in [facility] CancerNext-Expanded +RNAinsight Panel.  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  The report date is July 28, 2021.   The CancerNext-Expanded gene panel offered by [facility] and includes sequencing, rearrangement, and RNA analysis for the following 77 genes: AIP, ALK, APC, ATM, AXIN2, BAP1, BARD1, BLM, BMPR1A, BRCA1, BRCA2, BRIP1, CDC73, CDH1, CDK4, CDKN1B, CDKN2A, CHEK2, CTNNA1, DICER1, FANCC, FH,  FLCN, GALNT12, KIF1B, LZTR1, MAX, MEN1, MET, MLH1, MSH2, MSH3, MSH6, MUTYH, NBN, NF1, NF2, NTHL1, PALB2, PHOX2B, PMS2, POT1, PRKAR1A, PTCH1, PTEN, RAD51C, RAD51D, RB1, RECQL, RET, SDHA, SDHAF2, SDHB, SDHC, SDHD, SMAD4, SMARCA4, SMARCB1, SMARCE1, STK11, SUFU, TMEM127, TP53, TSC1, TSC2, VHL and XRCC2 (sequencing and deletion/duplication); EGFR, EGLN1, HOXB13, KIT, MITF, PDGFRA, POLD1, and POLE (sequencing only); EPCAM and GREM1 (deletion/duplication only).     Surgery   Bilateral Mastectomies with sentinel lymph node biopsy planned following chemotherapy    Radiation Therapy   Following surgery    Anti-estrogen oral therapy   To follow radiation     CHIEF COMPLIANT: Follow-up of right breast cancer  INTERVAL HISTORY: Jenna HOPPES is a 36 y.o. with above-mentioned history of right breast cancer, currently on chemotherapy with TCHP. She presents to the clinic today for follow-up.  Continues to have intermittent diarrhea and fatigue.  ALLERGIES:  has No Known Allergies.  MEDICATIONS:  Current Outpatient Medications  Medication Sig Dispense Refill   acetaminophen (TYLENOL) 500 MG tablet Take 1,000 mg by mouth every 6 (six) hours as needed for mild pain or headache.     cetirizine (ZYRTEC) 10 MG tablet Take 10 mg by mouth at bedtime.     clindamycin (CLINDAGEL) 1 % gel Apply topically 2 (two) times daily. 30 g 3   dexamethasone (DECADRON) 4 MG tablet TAKE 1 TABLET DAILY. TAKE 1 TABLET DAY BEFORE CHEMO AND 1 TABLET DAY AFTER CHEMO WITH FOOD 12 tablet 0   diphenoxylate-atropine (LOMOTIL) 2.5-0.025 MG tablet Take 1 tablet by mouth 3 (three) times daily as needed for diarrhea or loose stools. 30 tablet 3   enoxaparin (LOVENOX) 60 MG/0.6ML injection Inject 0.6 mLs (60 mg total) into the skin every 12 (twelve) hours. 6  mL 0   escitalopram (LEXAPRO) 20 MG tablet Take 20 mg by mouth daily.     ferrous sulfate 325 (65 FE) MG EC tablet Take 1 tablet (325 mg total) by mouth 2 (two) times daily. 60  tablet 2   ibuprofen (ADVIL) 600 MG tablet Take 1 tablet (600 mg total) by mouth every 6 (six) hours as needed. 30 tablet 0   lidocaine-prilocaine (EMLA) cream Apply to affected area once 30 g 3   loratadine (CLARITIN) 10 MG tablet Take 10 mg by mouth daily.     LORazepam (ATIVAN) 0.5 MG tablet TAKE 1 TABLET (0.5 MG TOTAL) BY MOUTH AT BEDTIME AS NEEDED (NAUSEA OR VOMITING). 30 tablet 0   magic mouthwash w/lidocaine SOLN Take 5 mLs by mouth 3 (three) times daily as needed for mouth pain. 100 mL 0   ondansetron (ZOFRAN) 8 MG tablet TAKE 1 TABLET 2 TIMES DAILY AS NEEDED (NAUSEA OR VOMITING). START ON THE THIRD DAY AFTER CHEMO 30 tablet 1   prochlorperazine (COMPAZINE) 10 MG tablet Take 1 tablet (10 mg total) by mouth every 6 (six) hours as needed (Nausea or vomiting). 30 tablet 1   rivaroxaban (XARELTO) 20 MG TABS tablet Take 1 tablet (20 mg total) by mouth daily with supper. 30 tablet 5   traMADol (ULTRAM) 50 MG tablet Take 2 tablets (100 mg total) by mouth every 6 (six) hours as needed. 10 tablet 0   valACYclovir (VALTREX) 500 MG tablet Take 500 mg by mouth 2 (two) times daily.     No current facility-administered medications for this visit.    PHYSICAL EXAMINATION: ECOG PERFORMANCE STATUS: 1 - Symptomatic but completely ambulatory  Vitals:   11/16/21 0821  BP: (!) 115/53  Pulse: 98  Resp: 17  Temp: 97.8 F (36.6 C)  SpO2: 98%   Filed Weights   11/16/21 0821  Weight: 135 lb 14.4 oz (61.6 kg)       LABORATORY DATA:  I have reviewed the data as listed CMP Latest Ref Rng & Units 10/26/2021 10/05/2021 10/02/2021  Glucose 70 - 99 mg/dL 90 122(H) 104(H)  BUN 6 - 20 mg/dL 13 8 8   Creatinine 0.44 - 1.00 mg/dL 0.57 0.58 0.73  Sodium 135 - 145 mmol/L 140 140 137  Potassium 3.5 - 5.1 mmol/L 3.4(L) 4.0 3.4(L)  Chloride 98 - 111 mmol/L 108 108 106  CO2 22 - 32 mmol/L 26 25 25   Calcium 8.9 - 10.3 mg/dL 9.5 9.0 8.6(L)  Total Protein 6.5 - 8.1 g/dL 6.5 6.9 -  Total Bilirubin 0.3 - 1.2  mg/dL 0.4 0.3 -  Alkaline Phos 38 - 126 U/L 96 95 -  AST 15 - 41 U/L 12(L) 11(L) -  ALT 0 - 44 U/L 12 10 -    Lab Results  Component Value Date   WBC 3.2 (L) 11/16/2021   HGB 9.7 (L) 11/16/2021   HCT 28.8 (L) 11/16/2021   MCV 105.1 (H) 11/16/2021   PLT 134 (L) 11/16/2021   NEUTROABS 1.4 (L) 11/16/2021    ASSESSMENT & PLAN:  Malignant neoplasm of upper-outer quadrant of right breast in female, estrogen receptor positive ([facility]) Palpable lump in the right breast. Diagnostic mammogram and [place]: a 3.8 cm mass at the retroareolar right breast, 1 prominent right axillary lymph node with a 4 mm cortex, 1 other which is borderline measuring 3 mm. 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  3 o'clock position: Biopsy fibroadenoma   Treatment plan: 1.  Neoadjuvant chemotherapy with TCH Perjeta 6 cycles followed by Herceptin Perjeta maintenance versus Kadcyla maintenance (based on response to neoadjuvant chemo) for 1 year 2. Followed by bilateral mastectomies with sentinel lymph node study 3. Followed by adjuvant radiation therapy  4.  Followed by antiestrogen therapy 5.  Followed by neratinib   Breast MRI 06/28/2021: Confluent mass right breast 11.1 x 7.8 x 9.5 cm, 1 enlarged lymph node and second borderline lymph node in the right axilla Brain MRI 07/03/2021: No evidence of malignancy. CT CAP and bone scan: 07/10/2021: Numerous enhancing right breast masses and right axillary lymph nodes, nonspecific small pulmonary nodules, bone scan: No bone metastases URCC nausea study, ------------------------------------------------------------------------------------------------------------------------------------ Current Treatment: Herceptin and Perjeta maintenance Patient is breast surgery has been scheduled for 12/06/2021. The plan is to undergo a prepectoral implant.  Dr.Dillingham is performing the reconstruction  surgery. Intermittent diarrhea and fatigue: Fatigue as a result of anemia. There is 3 more weeks until her surgery and that should be plenty of time for her to recover.  We will not do any further treatments until she finishes her surgery and returns back to see [place] afterwards to discuss the results.  This will then determine if she will continue with Herceptin Perjeta maintenance versus Kadcyla maintenance.  No orders of the defined types were placed in this encounter.  The patient has a good understanding of the overall plan. she agrees with it. she will call with any problems that may develop before the next visit here.  Total time spent: 30 mins including face to face time and time spent for planning, charting and coordination of care  Rulon Eisenmenger, MD, MPH 11/16/2021  I, Thana Ates, am acting as scribe for Dr. Nicholas Lose.  I have reviewed the above documentation for accuracy and completeness, and I agree with the above.

## 2021-11-15 NOTE — Telephone Encounter (Signed)
Called patient regarding upcoming appointments, left a voicemail. 

## 2021-11-15 NOTE — Assessment & Plan Note (Signed)
Palpable lump in the right breast. Diagnostic mammogram and [place]: a 3.8 cm mass at the retroareolar right breast, 1 prominent right axillary lymph node with a 4 mm cortex, 1 other which is borderline measuring 3 mm. 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 3 o'clock position: Biopsy fibroadenoma  Treatment plan: 1. Neoadjuvant chemotherapy with TCH Perjeta 6 cycles followed by Herceptin Perjeta maintenance versus Kadcyla maintenance (based on response to neoadjuvant chemo) for 1 year 2. Followed bybilateral mastectomieswith sentinel lymph node study 3. Followed by adjuvant radiation therapy 4.Followed by antiestrogen therapy 5.Followed by neratinib  Breast MRI 06/28/2021: Confluent mass right breast 11.1 x 7.8 x 9.5 cm, 1 enlarged lymph node and second borderline lymph node in the right axilla Brain MRI 07/03/2021: No evidence of malignancy. CT CAP and bone scan:07/10/2021: Numerous enhancing right breast masses and right axillary lymph nodes, nonspecific small pulmonary nodules,bone scan: No bone metastases URCC nausea study, ------------------------------------------------------------------------------------------------------------------------------------ Current Treatment: Herceptin and Perjeta maintenance

## 2021-11-16 ENCOUNTER — Inpatient Hospital Stay: Payer: 59

## 2021-11-16 ENCOUNTER — Inpatient Hospital Stay: Payer: 59 | Admitting: Hematology and Oncology

## 2021-11-16 ENCOUNTER — Other Ambulatory Visit: Payer: Self-pay | Admitting: Hematology and Oncology

## 2021-11-16 ENCOUNTER — Other Ambulatory Visit: Payer: Self-pay

## 2021-11-16 ENCOUNTER — Inpatient Hospital Stay: Payer: 59 | Attending: Hematology and Oncology

## 2021-11-16 DIAGNOSIS — C50411 Malignant neoplasm of upper-outer quadrant of right female breast: Secondary | ICD-10-CM

## 2021-11-16 DIAGNOSIS — Z170 Estrogen receptor positive status [ER+]: Secondary | ICD-10-CM | POA: Diagnosis not present

## 2021-11-16 DIAGNOSIS — Z79899 Other long term (current) drug therapy: Secondary | ICD-10-CM | POA: Insufficient documentation

## 2021-11-16 DIAGNOSIS — E222 Syndrome of inappropriate secretion of antidiuretic hormone: Secondary | ICD-10-CM

## 2021-11-16 DIAGNOSIS — Z5111 Encounter for antineoplastic chemotherapy: Secondary | ICD-10-CM | POA: Insufficient documentation

## 2021-11-16 DIAGNOSIS — Z5112 Encounter for antineoplastic immunotherapy: Secondary | ICD-10-CM | POA: Insufficient documentation

## 2021-11-16 DIAGNOSIS — Z95828 Presence of other vascular implants and grafts: Secondary | ICD-10-CM

## 2021-11-16 LAB — CMP (CANCER CENTER ONLY)
ALT: 16 U/L (ref 0–44)
AST: 16 U/L (ref 15–41)
Albumin: 3.8 g/dL (ref 3.5–5.0)
Alkaline Phosphatase: 92 U/L (ref 38–126)
Anion gap: 4 — ABNORMAL LOW (ref 5–15)
BUN: 10 mg/dL (ref 6–20)
CO2: 27 mmol/L (ref 22–32)
Calcium: 8.6 mg/dL — ABNORMAL LOW (ref 8.9–10.3)
Chloride: 110 mmol/L (ref 98–111)
Creatinine: 0.63 mg/dL (ref 0.44–1.00)
GFR, Estimated: >60 mL/min (ref >60)
Glucose, Bld: 89 mg/dL (ref 70–99)
Potassium: 3.8 mmol/L (ref 3.5–5.1)
Sodium: 141 mmol/L (ref 135–145)
Total Bilirubin: 0.4 mg/dL (ref 0.3–1.2)
Total Protein: 6.1 g/dL — ABNORMAL LOW (ref 6.5–8.1)

## 2021-11-16 LAB — CBC WITH DIFFERENTIAL (CANCER CENTER ONLY)
Abs Immature Granulocytes: 0.01 10*3/uL (ref 0.00–0.07)
Basophils Absolute: 0 10*3/uL (ref 0.0–0.1)
Basophils Relative: 1 %
Eosinophils Absolute: 0 10*3/uL (ref 0.0–0.5)
Eosinophils Relative: 0 %
HCT: 28.8 % — ABNORMAL LOW (ref 36.0–46.0)
Hemoglobin: 9.7 g/dL — ABNORMAL LOW (ref 12.0–15.0)
Immature Granulocytes: 0 %
Lymphocytes Relative: 47 %
Lymphs Abs: 1.5 10*3/uL (ref 0.7–4.0)
MCH: 35.4 pg — ABNORMAL HIGH (ref 26.0–34.0)
MCHC: 33.7 g/dL (ref 30.0–36.0)
MCV: 105.1 fL — ABNORMAL HIGH (ref 80.0–100.0)
Monocytes Absolute: 0.2 10*3/uL (ref 0.1–1.0)
Monocytes Relative: 7 %
Neutro Abs: 1.4 10*3/uL — ABNORMAL LOW (ref 1.7–7.7)
Neutrophils Relative %: 45 %
Platelet Count: 134 10*3/uL — ABNORMAL LOW (ref 150–400)
RBC: 2.74 MIL/uL — ABNORMAL LOW (ref 3.87–5.11)
RDW: 15.9 % — ABNORMAL HIGH (ref 11.5–15.5)
WBC Count: 3.2 10*3/uL — ABNORMAL LOW (ref 4.0–10.5)
nRBC: 0 % (ref 0.0–0.2)

## 2021-11-16 MED ORDER — SODIUM CHLORIDE 0.9% FLUSH
10.0000 mL | INTRAVENOUS | Status: DC | PRN
  Administered 2021-11-16: 10 mL

## 2021-11-16 MED ORDER — SODIUM CHLORIDE 0.9% FLUSH
10.0000 mL | INTRAVENOUS | Status: AC | PRN
  Administered 2021-11-16: 10 mL

## 2021-11-16 MED ORDER — ACETAMINOPHEN 325 MG PO TABS
650.0000 mg | ORAL_TABLET | Freq: Once | ORAL | Status: AC
  Administered 2021-11-16: 650 mg via ORAL
  Filled 2021-11-16: qty 2

## 2021-11-16 MED ORDER — SODIUM CHLORIDE 0.9 % IV SOLN
420.0000 mg | Freq: Once | INTRAVENOUS | Status: AC
  Administered 2021-11-16: 420 mg via INTRAVENOUS
  Filled 2021-11-16: qty 14

## 2021-11-16 MED ORDER — DIPHENHYDRAMINE HCL 25 MG PO CAPS
25.0000 mg | ORAL_CAPSULE | Freq: Once | ORAL | Status: AC
  Administered 2021-11-16: 25 mg via ORAL
  Filled 2021-11-16: qty 1

## 2021-11-16 MED ORDER — TRASTUZUMAB-ANNS CHEMO 150 MG IV SOLR
6.0000 mg/kg | Freq: Once | INTRAVENOUS | Status: AC
  Administered 2021-11-16: 378 mg via INTRAVENOUS
  Filled 2021-11-16: qty 18

## 2021-11-16 MED ORDER — SODIUM CHLORIDE 0.9 % IV SOLN: Freq: Once | INTRAVENOUS | Status: AC

## 2021-11-16 MED ORDER — HEPARIN SOD (PORK) LOCK FLUSH 100 UNIT/ML IV SOLN
500.0000 [IU] | Freq: Once | INTRAVENOUS | Status: AC | PRN
  Administered 2021-11-16: 500 [IU]

## 2021-11-16 MED ORDER — GOSERELIN ACETATE 3.6 MG ~~LOC~~ IMPL
3.6000 mg | DRUG_IMPLANT | Freq: Once | SUBCUTANEOUS | Status: AC
  Administered 2021-11-16: 3.6 mg via SUBCUTANEOUS
  Filled 2021-11-16: qty 3.6

## 2021-11-16 NOTE — Progress Notes (Signed)
Ok to treat with ANC of 1.4k/uL per Dr. Lindi Adie.

## 2021-11-16 NOTE — Patient Instructions (Signed)
[facility]  Discharge Instructions: Thank you for choosing [facility] to provide your oncology and hematology care.   If you have a lab appointment with the [facility], please go directly to the [facility] and check in at the registration area.   Wear comfortable clothing and clothing appropriate for easy access to any Portacath or PICC line.   We strive to give you quality time with your provider. You may need to reschedule your appointment if you arrive late (15 or more minutes).  Arriving late affects you and other patients whose appointments are after yours.  Also, if you miss three or more appointments without notifying the office, you may be dismissed from the clinic at the providers discretion.      For prescription refill requests, have your pharmacy contact our office and allow 72 hours for refills to be completed.    Today you received the following chemotherapy and/or immunotherapy agents: Trastuzumab, Pertuzumab (Perjeta) and Zoladex   To help prevent nausea and vomiting after your treatment, we encourage you to take your nausea medication as directed.  BELOW ARE SYMPTOMS THAT SHOULD BE REPORTED IMMEDIATELY: *FEVER GREATER THAN 100.4 F (38 C) OR HIGHER *CHILLS OR SWEATING *NAUSEA AND VOMITING THAT IS NOT CONTROLLED WITH YOUR NAUSEA MEDICATION *UNUSUAL SHORTNESS OF BREATH *UNUSUAL BRUISING OR BLEEDING *URINARY PROBLEMS (pain or burning when urinating, or frequent urination) *BOWEL PROBLEMS (unusual diarrhea, constipation, pain near the anus) TENDERNESS IN MOUTH AND THROAT WITH OR WITHOUT PRESENCE OF ULCERS (sore throat, sores in mouth, or a toothache) UNUSUAL RASH, SWELLING OR PAIN  UNUSUAL VAGINAL DISCHARGE OR ITCHING   Items with * indicate a potential emergency and should be followed up as soon as possible or go to the Emergency Department if any problems should occur.  Please show the CHEMOTHERAPY ALERT CARD or  IMMUNOTHERAPY ALERT CARD at check-in to the Emergency Department and triage nurse.  Should you have questions after your visit or need to cancel or reschedule your appointment, please contact [facility]  Dept: [phone]  and follow the prompts.  Office hours are 8:00 a.m. to 4:30 p.m. Monday - Friday. Please note that voicemails left after 4:00 p.m. may not be returned until the following business day.  We are closed weekends and major holidays. You have access to a nurse at all times for urgent questions. Please call the main number to the clinic Dept: [phone] and follow the prompts.   For any non-urgent questions, you may also contact your provider using MyChart. We now offer e-Visits for anyone 52 and older to request care online for non-urgent symptoms. For details visit [email or web address].   Also download the MyChart app! Go to the app store, search "MyChart", open the app, select [facility], and log in with your MyChart username and password.  Due to Covid, a mask is required upon entering the hospital/clinic. If you do not have a mask, one will be given to you upon arrival. For doctor visits, patients may have 1 support person aged 73 or older with them. For treatment visits, patients cannot have anyone with them due to current Covid guidelines and our immunocompromised population.

## 2021-11-18 ENCOUNTER — Ambulatory Visit: Payer: 59

## 2021-11-20 ENCOUNTER — Encounter: Payer: Self-pay | Admitting: Hematology and Oncology

## 2021-11-20 ENCOUNTER — Encounter: Payer: 59 | Admitting: Surgical

## 2021-11-21 ENCOUNTER — Inpatient Hospital Stay: Payer: 59 | Admitting: Adult Health

## 2021-11-21 ENCOUNTER — Telehealth (INDEPENDENT_AMBULATORY_CARE_PROVIDER_SITE_OTHER): Payer: 59 | Admitting: Surgical

## 2021-11-21 ENCOUNTER — Encounter: Payer: Self-pay | Admitting: Surgical

## 2021-11-21 DIAGNOSIS — Z170 Estrogen receptor positive status [ER+]: Secondary | ICD-10-CM

## 2021-11-21 DIAGNOSIS — C50411 Malignant neoplasm of upper-outer quadrant of right female breast: Secondary | ICD-10-CM

## 2021-11-21 NOTE — Progress Notes (Signed)
Pt requested reschedule due to feeling ill.

## 2021-11-22 ENCOUNTER — Encounter: Payer: Self-pay | Admitting: *Deleted

## 2021-11-22 ENCOUNTER — Telehealth: Payer: Self-pay

## 2021-11-22 ENCOUNTER — Encounter: Payer: Self-pay | Admitting: Physician Assistant

## 2021-11-22 NOTE — Progress Notes (Signed)
Received surgical clearance form Dr. Marla Roe office's.  Form filled out and successfully faxed back.

## 2021-11-22 NOTE — Progress Notes (Signed)
Received medical clearance from patient's oncologist, Dr. Lindi Adie, to proceed with surgery.  They have prescribed patient Lovenox to begin taking 3 days prior to surgery to bridge them from Xarelto.  Per request form, patient has already picked up the medications from pharmacy and is aware of the instructions.

## 2021-11-22 NOTE — Telephone Encounter (Signed)
Faxed request Surgical Clearance to Dr. Lindi Adie. Patient is on Xarelto will need to bridge levonox.

## 2021-11-24 ENCOUNTER — Inpatient Hospital Stay (HOSPITAL_BASED_OUTPATIENT_CLINIC_OR_DEPARTMENT_OTHER): Payer: 59 | Admitting: Adult Health

## 2021-11-24 ENCOUNTER — Other Ambulatory Visit: Payer: Self-pay

## 2021-11-24 DIAGNOSIS — Z170 Estrogen receptor positive status [ER+]: Secondary | ICD-10-CM

## 2021-11-24 DIAGNOSIS — C50411 Malignant neoplasm of upper-outer quadrant of right female breast: Secondary | ICD-10-CM | POA: Diagnosis not present

## 2021-11-24 DIAGNOSIS — Z5111 Encounter for antineoplastic chemotherapy: Secondary | ICD-10-CM | POA: Diagnosis not present

## 2021-11-24 NOTE — Progress Notes (Signed)
[facility] Follow up:    Jenna Cross, [place] [place] [address]   DIAGNOSIS:  Cancer Staging  Malignant neoplasm of upper-outer quadrant of right breast in female, estrogen receptor positive ([place]) Staging form: Breast, AJCC 8th Edition - Clinical: Stage IB (cT2, cN1, cM0, G2, ER+, PR+, HER2+) - Unsigned Histologic grading system: 3 grade system   SUMMARY OF ONCOLOGIC HISTORY: Oncology History  Malignant neoplasm of upper-outer quadrant of right breast in female, estrogen receptor positive ([facility])  06/23/2021 Initial Diagnosis   Palpable lump in the right breast. Diagnostic mammogram and [place]: a 3.8 cm mass at the retroareolar right breast, 1 prominent right axillary lymph node with a 4 mm cortex, 1 other which is borderline measuring 3 mm. 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  3 o'clock position: Biopsy fibroadenoma   07/12/2021 -  Neo-Adjuvant Chemotherapy   Neoadjuvant chemotherapy with TCH Perjeta 6 cycles followed by Herceptin Perjeta maintenance versus Kadcyla maintenance (based on response to neoadjuvant chemo) for 1 year   07/28/2021 Genetic Testing   Negative hereditary cancer genetic testing: no pathogenic variants detected in [facility] CancerNext-Expanded +RNAinsight Panel.  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  The report date is July 28, 2021.   The CancerNext-Expanded gene panel offered by [facility] and includes sequencing, rearrangement, and RNA analysis for the following 77 genes: AIP, ALK, APC, ATM, AXIN2, BAP1, BARD1, BLM, BMPR1A, BRCA1, BRCA2, BRIP1, CDC73, CDH1, CDK4, CDKN1B, CDKN2A, CHEK2, CTNNA1, DICER1, FANCC, FH, FLCN, GALNT12, KIF1B, LZTR1, MAX, MEN1, MET, MLH1, MSH2, MSH3, MSH6, MUTYH, NBN, NF1, NF2, NTHL1, PALB2, PHOX2B, PMS2, POT1, PRKAR1A, PTCH1, PTEN, RAD51C, RAD51D, RB1, RECQL, RET, SDHA,  SDHAF2, SDHB, SDHC, SDHD, SMAD4, SMARCA4, SMARCB1, SMARCE1, STK11, SUFU, TMEM127, TP53, TSC1, TSC2, VHL and XRCC2 (sequencing and deletion/duplication); EGFR, EGLN1, HOXB13, KIT, MITF, PDGFRA, POLD1, and POLE (sequencing only); EPCAM and GREM1 (deletion/duplication only).     Surgery   Bilateral Mastectomies with sentinel lymph node biopsy planned following chemotherapy    Radiation Therapy   Following surgery    Anti-estrogen oral therapy   To follow radiation     CURRENT THERAPY:  awaiting surgery most recent herceptin and perjeta 2/9, zoladex (last inj on 2/9)  INTERVAL HISTORY: Jenna Cross 36 y.o. female returns for evaluation of numbness in her arm.  She is also feeling some numbness in her right hand.  She is concerned about this area and wants to know what could be going on.  She notes she and her family are recovering from having viral gastroenteritis.  She is recovering her strength and is feeling better.    Patient Active Problem List   Diagnosis Date Noted   Deep venous thrombosis ([place]) 09/26/2021   Genetic testing 08/01/2021   Family history of prostate cancer 07/17/2021   Malignant neoplasm of upper-outer quadrant of right breast in female, estrogen receptor positive ([place]) 06/27/2021   Breast tenderness in female 05/26/2021   Breast mass 05/26/2021   History of preterm premature rupture of membranes (PPROM) 01/31/2021   Preterm premature rupture of membranes (PPROM) with unknown onset of labor 01/27/2021   Vacuum extractor delivery, delivered 01/04/2020   Pregnancy 01/03/2020   Encounter for counseling 08/29/2018   Paresthesia 04/05/2016    has No Known Allergies.  MEDICAL HISTORY: Past Medical History:  Diagnosis Date   Anemia    Anxiety    Anxiety    no  medication   Brain cancer ([place])    Cancer ([place])    Family history of prostate cancer 07/17/2021    SURGICAL HISTORY: Past Surgical History:  Procedure Laterality Date   BREAST SURGERY     cyst  removal from right breast    CESAREAN SECTION N/A 01/31/2021   Procedure: CESAREAN SECTION;  Surgeon: Jenna Queen, MD;  Location: [facility];  Service: Obstetrics;  Laterality: N/A;   excision of fibroadenoma of breast     PORTACATH PLACEMENT N/A 07/11/2021   Procedure: INSERTION PORT-A-CATH;  Surgeon: Rolm Bookbinder, MD;  Location: [facility];  Service: General;  Laterality: N/A;   TONSILLECTOMY     and adenoids   TONSILLECTOMY AND ADENOIDECTOMY     WISDOM TOOTH EXTRACTION      SOCIAL HISTORY: Social History   Socioeconomic History   Marital status: Married    Spouse name: Not on file   Number of children: 0   Years of education: College   Highest education level: Not on file  Occupational History   Not on file  Tobacco Use   Smoking status: Former   Smokeless tobacco: Never  Vaping Use   Vaping Use: Never used  Substance and Sexual Activity   Alcohol use: Not Currently   Drug use: Never   Sexual activity: Yes    Birth control/protection: None  Other Topics Concern   Not on file  Social History Narrative   ** Merged History Encounter **       Lives at home Right handed Drinks 1 cup of coffee daily and a couple of sodas per month   Social Determinants of Health   Financial Resource Strain: Not on file  Food Insecurity: Not on file  Transportation Needs: Not on file  Physical Activity: Not on file  Stress: Not on file  Social Connections: Not on file  Intimate Partner Violence: Not on file    FAMILY HISTORY: Family History  Problem Relation Age of Onset   Migraines Mother    Lymphoma Paternal Aunt        dx late 72s   Prostate cancer Maternal Grandfather        dx after 50    Review of Systems  Constitutional:  Negative for appetite change, chills, fatigue, fever and unexpected weight change.  HENT:   Negative for hearing loss, lump/mass and trouble swallowing.   Eyes:  Negative for eye problems and icterus.  Respiratory:   Negative for chest tightness, cough and shortness of breath.   Cardiovascular:  Negative for chest pain, leg swelling and palpitations.  Gastrointestinal:  Negative for abdominal distention, abdominal pain, constipation, diarrhea, nausea and vomiting.  Endocrine: Negative for hot flashes.  Genitourinary:  Negative for difficulty urinating.   Musculoskeletal:  Negative for arthralgias.  Skin:  Negative for itching and rash.  Neurological:  Negative for dizziness, extremity weakness, headaches and numbness.  Hematological:  Negative for adenopathy. Does not bruise/bleed easily.  Psychiatric/Behavioral:  Negative for depression. The patient is not nervous/anxious.      PHYSICAL EXAMINATION  ECOG PERFORMANCE STATUS: 1 - Symptomatic but completely ambulatory  Vitals:   11/24/21 1519  BP: 116/68  Pulse: 100  Resp: 16  Temp: 97.7 F (36.5 C)  SpO2: 98%    Physical Exam Constitutional:      General: She is not in acute distress.    Appearance: Normal appearance. She is not toxic-appearing.  HENT:     Head: Normocephalic and atraumatic.  Eyes:  General: No scleral icterus. Cardiovascular:     Rate and Rhythm: Normal rate and regular rhythm.     Pulses: Normal pulses.     Heart sounds: Normal heart sounds.  Pulmonary:     Effort: Pulmonary effort is normal.     Breath sounds: Normal breath sounds.  Abdominal:     General: Abdomen is flat. Bowel sounds are normal. There is no distension.     Palpations: Abdomen is soft.     Tenderness: There is no abdominal tenderness.  Musculoskeletal:        General: No swelling.     Cervical back: Neck supple.  Lymphadenopathy:     Cervical: No cervical adenopathy.  Skin:    General: Skin is warm and dry.     Findings: No rash.  Neurological:     General: No focal deficit present.     Mental Status: She is alert.  Psychiatric:        Mood and Affect: Mood normal.        Behavior: Behavior normal.    LABORATORY DATA:  CBC     Component Value Date/Time   WBC 3.2 (L) 11/16/2021 0810   WBC 6.2 10/05/2021 0835   RBC 2.74 (L) 11/16/2021 0810   HGB 9.7 (L) 11/16/2021 0810   HCT 28.8 (L) 11/16/2021 0810   PLT 134 (L) 11/16/2021 0810   MCV 105.1 (H) 11/16/2021 0810   MCH 35.4 (H) 11/16/2021 0810   MCHC 33.7 11/16/2021 0810   RDW 15.9 (H) 11/16/2021 0810   LYMPHSABS 1.5 11/16/2021 0810   MONOABS 0.2 11/16/2021 0810   EOSABS 0.0 11/16/2021 0810   BASOSABS 0.0 11/16/2021 0810    CMP     Component Value Date/Time   NA 141 11/16/2021 0810   NA 140 04/05/2016 0932   K 3.8 11/16/2021 0810   CL 110 11/16/2021 0810   CO2 27 11/16/2021 0810   GLUCOSE 89 11/16/2021 0810   BUN 10 11/16/2021 0810   BUN 12 04/05/2016 0932   CREATININE 0.63 11/16/2021 0810   CALCIUM 8.6 (L) 11/16/2021 0810   PROT 6.1 (L) 11/16/2021 0810   PROT 7.2 04/05/2016 0932   ALBUMIN 3.8 11/16/2021 0810   ALBUMIN 4.4 04/05/2016 0932   AST 16 11/16/2021 0810   ALT 16 11/16/2021 0810   ALKPHOS 92 11/16/2021 0810   BILITOT 0.4 11/16/2021 0810   GFRNONAA >60 11/16/2021 0810   GFRAA 109 04/05/2016 0932      ASSESSMENT and THERAPY PLAN:   Malignant neoplasm of upper-outer quadrant of right breast in female, estrogen receptor positive (HCC) Palpable lump in the right breast. Diagnostic mammogram and [place]: a 3.8 cm mass at the retroareolar right breast, 1 prominent right axillary lymph node with a 4 mm cortex, 1 other which is borderline measuring 3 mm. 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  3 o'clock position: Biopsy fibroadenoma   Treatment plan: 1. Neoadjuvant chemotherapy with TCH Perjeta 6 cycles followed by Herceptin Perjeta maintenance versus Kadcyla maintenance (based on response to neoadjuvant chemo) for 1 year 2. Followed by bilateral mastectomies with sentinel lymph node study 3. Followed by adjuvant radiation therapy  4.  Followed by antiestrogen  therapy 5.  Followed by neratinib   Breast MRI 06/28/2021: Confluent mass right breast 11.1 x 7.8 x 9.5 cm, 1 enlarged lymph node and second borderline lymph node in the right axilla Brain MRI 07/03/2021: No evidence of malignancy. CT CAP and bone scan:  07/10/2021: Numerous enhancing right breast masses and right axillary lymph nodes, nonspecific small pulmonary nodules, bone scan: No bone metastases U[facility] nausea study, ------------------------------------------------------------------------------------------------------------------------------------ Current Treatment: Herceptin and Perjeta maintenance  Sahithi is here today for urgent evaluation of numbness in her right posterior axilla.  After discussion, she recalls this began after her lymph node biopsy.  She will continue to monitor this area.  Her recent MRI showed resolution of her lymphadenopathy.  We discussed this in detail.  I suggested she talk to Dr. Chrissie Noa prior to her surgery as well to see if the nerve block given during surgery might help this issue as well.    Shadai will f/u after surgery.  She knows to call for any questions that may arise between now and her next appointment.  We are happy to see her sooner if needed.    All questions were answered. The patient knows to call the clinic with any problems, questions or concerns. We can certainly see the patient much sooner if necessary.  Total encounter time: 20 minutes in face to face visit time, chart review, lab review, care coordination, and documentation of the encounter.   Wilber Bihari, NP 11/27/21 6:48 PM Medical Oncology and Hematology [facility] [place], [place] [address] Tel. [phone]    Fax. [phone]  *Total Encounter Time as defined by the Centers for Medicare and Medicaid Services includes, in addition to the face-to-face time of a patient visit (documented in the note above) non-face-to-face time: obtaining and  reviewing outside history, ordering and reviewing medications, tests or procedures, care coordination (communications with other health care professionals or caregivers) and documentation in the medical record.

## 2021-11-27 ENCOUNTER — Encounter: Payer: Self-pay | Admitting: Hematology and Oncology

## 2021-11-27 ENCOUNTER — Encounter: Payer: Self-pay | Admitting: Adult Health

## 2021-11-27 NOTE — Assessment & Plan Note (Signed)
Palpable lump in the right breast. Diagnostic mammogram and US: a 3.8 cm mass at the retroareolar right breast, 1 prominent right axillary lymph node with a 4 mm cortex, 1 other which is borderline measuring 3 mm. 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  3 o'clock position: Biopsy fibroadenoma °  °Treatment plan: °1. Neoadjuvant chemotherapy with TCH Perjeta ×6 cycles followed by Herceptin Perjeta maintenance versus Kadcyla maintenance (based on response to neoadjuvant chemo) for 1 year °2. Followed by bilateral mastectomies with sentinel lymph node study °3. Followed by adjuvant radiation therapy  °4.  Followed by antiestrogen therapy °5.  Followed by neratinib °  °Breast MRI 06/28/2021: Confluent mass right breast 11.1 x 7.8 x 9.5 cm, 1 enlarged lymph node and second borderline lymph node in the right axilla °Brain MRI 07/03/2021: No evidence of malignancy. °CT CAP and bone scan: 07/10/2021: Numerous enhancing right breast masses and right axillary lymph nodes, nonspecific small pulmonary nodules, bone scan: No bone metastases °[facility] nausea study, °------------------------------------------------------------------------------------------------------------------------------------ °Current Treatment: Herceptin and Perjeta maintenance ° °Icess is here today for urgent evaluation of numbness in her right posterior axilla.  After discussion, she recalls this began after her lymph node biopsy.  She will continue to monitor this area.  Her recent MRI showed resolution of her lymphadenopathy.  We discussed this in detail.  I suggested she talk to Dr. Wakefiled prior to her surgery as well to see if the nerve block given during surgery might help this issue as well.   ° °Alvina will f/u after surgery.  She knows to call for any questions that may arise between now and her next appointment.  We are happy to see her sooner if needed. ° °

## 2021-11-28 ENCOUNTER — Other Ambulatory Visit: Payer: Self-pay

## 2021-11-28 ENCOUNTER — Encounter (HOSPITAL_BASED_OUTPATIENT_CLINIC_OR_DEPARTMENT_OTHER): Payer: Self-pay | Admitting: General Surgery

## 2021-11-30 NOTE — H&P (View-Only) (Signed)
Patient ID: Jenna Cross, female    DOB: 1985-12-31, 36 y.o.   MRN: [id number]  Chief Complaint  Patient presents with   Pre-op Exam      ICD-10-CM   1. Malignant neoplasm of upper-outer quadrant of right breast in female, estrogen receptor positive ([place])  C50.411    Z17.0        History of Present Illness: Jenna Cross is a 36 y.o.  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  She presents for preoperative evaluation for upcoming procedure, bilateral immediate breast reconstruction placement of tissue expander and Flex HD, scheduled for 12/06/2021 with Dr. Marla Roe after bilateral skin sparing total mastectomy by Dr. Donne Hazel  The patient has not had problems with anesthesia.  Patient with history of DVT of left IJ from Port-A-Cath.  No other history of DVT.  No family history of DVT.   Summary of Previous Visit: She has finished chemotherapy and her Port-A-Cath is on the left side.  She is planning on having postop radiation.  Plan on a prepectoral approach and not salvage the NAC  PMH Significant for: DVT of left IJ, currently on Xarelto.  Anemia with most recent hemoglobin 9.7.  Per oncology, currently holding further treatments until she finishes surgery.  She is scheduled to start radiation 6 weeks after surgery, she is aware that reconstruction at that time may need to be delayed.  We will expand as fast as safely possible and attempt to exchange prior to starting radiation.  If we are unable to exchange prior to radiation, she is aware that we will need to wait 6 months after radiation has been completed.   Past Medical History: Allergies: No Known Allergies  Current Medications:  Current Outpatient Medications:    acetaminophen (TYLENOL) 500 MG tablet, Take 1,000 mg by mouth every 6 (six) hours as needed for mild pain or headache., Disp: , Rfl:    cephALEXin (KEFLEX) 500 MG capsule, Take 1 capsule (500 mg  total) by mouth 4 (four) times daily for 5 days., Disp: 20 capsule, Rfl: 0   dexamethasone (DECADRON) 4 MG tablet, TAKE 1 TABLET DAILY. TAKE 1 TABLET DAY BEFORE CHEMO AND 1 TABLET DAY AFTER CHEMO WITH FOOD, Disp: 12 tablet, Rfl: 0   diphenoxylate-atropine (LOMOTIL) 2.5-0.025 MG tablet, Take 1 tablet by mouth 3 (three) times daily as needed for diarrhea or loose stools., Disp: 30 tablet, Rfl: 3   enoxaparin (LOVENOX) 60 MG/0.6ML injection, Inject 0.6 mLs (60 mg total) into the skin every 12 (twelve) hours. (Patient taking differently: Inject 60 mg into the skin every 12 (twelve) hours.), Disp: 6 mL, Rfl: 0   escitalopram (LEXAPRO) 20 MG tablet, Take 20 mg by mouth daily., Disp: , Rfl:    ibuprofen (ADVIL) 600 MG tablet, Take 1 tablet (600 mg total) by mouth every 6 (six) hours as needed., Disp: 30 tablet, Rfl: 0   lidocaine-prilocaine (EMLA) cream, Apply to affected area once, Disp: 30 g, Rfl: 3   loratadine (CLARITIN) 10 MG tablet, Take 10 mg by mouth daily., Disp: , Rfl:    LORazepam (ATIVAN) 0.5 MG tablet, TAKE 1 TABLET (0.5 MG TOTAL) BY MOUTH AT BEDTIME AS NEEDED (NAUSEA OR VOMITING)., Disp: 30 tablet, Rfl: 0   magic mouthwash w/lidocaine SOLN, Take 5 mLs by mouth 3 (three) times daily as needed for mouth pain., Disp: 100 mL, Rfl: 0   ondansetron (ZOFRAN) 8 MG tablet, TAKE 1 TABLET  2 TIMES DAILY AS NEEDED (NAUSEA OR VOMITING). START ON THE THIRD DAY AFTER CHEMO, Disp: 30 tablet, Rfl: 1   oxyCODONE (OXY IR/ROXICODONE) 5 MG immediate release tablet, Take 1 tablet (5 mg total) by mouth every 6 (six) hours as needed for up to 5 days for severe pain., Disp: 20 tablet, Rfl: 0   prochlorperazine (COMPAZINE) 10 MG tablet, Take 1 tablet (10 mg total) by mouth every 6 (six) hours as needed (Nausea or vomiting)., Disp: 30 tablet, Rfl: 1   rivaroxaban (XARELTO) 20 MG TABS tablet, Take 1 tablet (20 mg total) by mouth daily with supper., Disp: 30 tablet, Rfl: 5   valACYclovir (VALTREX) 500 MG tablet, Take 500 mg  by mouth 2 (two) times daily., Disp: , Rfl:   Past Medical Problems: Past Medical History:  Diagnosis Date   Anemia    Anxiety    Anxiety    no medication   Cancer ([place])    breast   Family history of prostate cancer 07/17/2021   History of DVT (deep vein thrombosis)     Past Surgical History: Past Surgical History:  Procedure Laterality Date   BREAST SURGERY     cyst removal from right breast    CESAREAN SECTION N/A 01/31/2021   Procedure: CESAREAN SECTION;  Surgeon: Dian Queen, MD;  Location: [facility];  Service: Obstetrics;  Laterality: N/A;   excision of fibroadenoma of breast     PORTACATH PLACEMENT N/A 07/11/2021   Procedure: INSERTION PORT-A-CATH;  Surgeon: Rolm Bookbinder, MD;  Location: [facility];  Service: General;  Laterality: N/A;   TONSILLECTOMY     and adenoids   TONSILLECTOMY AND ADENOIDECTOMY     WISDOM TOOTH EXTRACTION      Social History: Social History   Socioeconomic History   Marital status: Married    Spouse name: Not on file   Number of children: 0   Years of education: College   Highest education level: Not on file  Occupational History   Not on file  Tobacco Use   Smoking status: Former   Smokeless tobacco: Never  Vaping Use   Vaping Use: Never used  Substance and Sexual Activity   Alcohol use: Not Currently   Drug use: Never   Sexual activity: Yes    Birth control/protection: None  Other Topics Concern   Not on file  Social History Narrative   ** Merged History Encounter **       Lives at home Right handed Drinks 1 cup of coffee daily and a couple of sodas per month   Social Determinants of Health   Financial Resource Strain: Not on file  Food Insecurity: Not on file  Transportation Needs: Not on file  Physical Activity: Not on file  Stress: Not on file  Social Connections: Not on file  Intimate Partner Violence: Not on file    Family History: Family History  Problem Relation Age of Onset    Migraines Mother    Lymphoma Paternal Aunt        dx late 75s   Prostate cancer Maternal Grandfather        dx after 50    Review of Systems: Review of Systems  Constitutional: Negative.   Respiratory: Negative.    Cardiovascular: Negative.   Gastrointestinal: Negative.   Neurological: Negative.    Physical Exam: Vital Signs BP 110/73 (BP Location: Left Arm, Patient Position: Sitting, Cuff Size: Small)    Pulse 76    Ht 5\' 4"  (  1.626 m)    Wt 132 lb 12.8 oz (60.2 kg)    SpO2 98%    BMI 22.80 kg/m   Physical Exam  Constitutional:      General: Not in acute distress.    Appearance: Normal appearance. Not ill-appearing.  HENT:     Head: Normocephalic and atraumatic.  Eyes:     Pupils: Pupils are equal, round Neck:     Musculoskeletal: Normal range of motion.  Cardiovascular:     Rate and Rhythm: Normal rate    Pulses: Normal pulses.  Pulmonary:     Effort: Pulmonary effort is normal. No respiratory distress.  Musculoskeletal: Normal range of motion.  Skin:    General: Skin is warm and dry.     Findings: No erythema or rash.  Neurological:     General: No focal deficit present.     Mental Status: Alert and oriented to person, place, and time. Mental status is at baseline.     Motor: No weakness.  Psychiatric:        Mood and Affect: Mood normal.        Behavior: Behavior normal.    Assessment/Plan: The patient is scheduled for bilateral immediate breast reconstruction with placement of tissue expanders and Flex HD with Dr. Marla Roe.  Risks, benefits, and alternatives of procedure discussed, questions answered and consent obtained.    Smoking Status: Non-smoker; Counseling Given?  N/A  Caprini Score: 9, highest; Risk Factors include: History of DVT of left IJ, Port-A-Cath in place, history of cancer, and length of planned surgery. Recommendation for mechanical and chemoprophylaxis. Encourage early ambulation.    Pictures obtained: @consult   Post-op Rx sent to  pharmacy:  Oxycodone, Keflex  Patient was provided with the Breast Reconstruction and General Surgical Risk consent document and Pain Medication Agreement prior to their appointment.  They had adequate time to read through the risk consent documents and Pain Medication Agreement. We also discussed them in person together during this preop appointment. All of their questions were answered to their satisfaction.  Recommended calling if they have any further questions.  Risk consent form and Pain Medication Agreement to be scanned into patient's chart.  The risks that can be encountered with and after placement of a breast expander placement were discussed and include the following but not limited to these: bleeding, infection, delayed healing, anesthesia risks, skin sensation changes, injury to structures including nerves, blood vessels, and muscles which may be temporary or permanent, allergies to tape, suture materials and glues, blood products, topical preparations or injected agents, skin contour irregularities, skin discoloration and swelling, deep vein thrombosis, cardiac and pulmonary complications, pain, which may persist, fluid accumulation, wrinkling of the skin over the expander, changes in nipple or breast sensation, expander leakage or rupture, faulty position of the expander, persistent pain, formation of tight scar tissue around the expander (capsular contracture), possible need for revisional surgery or staged procedures.  Per medical clearance form from oncology, patient provided with Lovenox to begin 3 days prior to surgery for bridging from [place].    Electronically signed by: Carola Rhine Maryssa Giampietro, PA-C 12/01/2021 8:59 AM

## 2021-11-30 NOTE — Progress Notes (Signed)
Patient ID: Jenna Cross, female    DOB: [birth year], 36 y.o.   MRN: [id number]  Chief Complaint  Patient presents with   Pre-op Exam      ICD-10-CM   1. Malignant neoplasm of upper-outer quadrant of right breast in female, estrogen receptor positive ([place])  C50.411    Z17.0        History of Present Illness: Jenna Cross is a 36 y.o.  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  She presents for preoperative evaluation for upcoming procedure, bilateral immediate breast reconstruction placement of tissue expander and Flex HD, scheduled for 12/06/2021 with Dr. Marla Roe after bilateral skin sparing total mastectomy by Dr. Donne Hazel  The patient has not had problems with anesthesia.  Patient with history of DVT of left IJ from Port-A-Cath.  No other history of DVT.  No family history of DVT.   Summary of Previous Visit: She has finished chemotherapy and her Port-A-Cath is on the left side.  She is planning on having postop radiation.  Plan on a prepectoral approach and not salvage the NAC  PMH Significant for: DVT of left IJ, currently on Xarelto.  Anemia with most recent hemoglobin 9.7.  Per oncology, currently holding further treatments until she finishes surgery.  She is scheduled to start radiation 6 weeks after surgery, she is aware that reconstruction at that time may need to be delayed.  We will expand as fast as safely possible and attempt to exchange prior to starting radiation.  If we are unable to exchange prior to radiation, she is aware that we will need to wait 6 months after radiation has been completed.   Past Medical History: Allergies: No Known Allergies  Current Medications:  Current Outpatient Medications:    acetaminophen (TYLENOL) 500 MG tablet, Take 1,000 mg by mouth every 6 (six) hours as needed for mild pain or headache., Disp: , Rfl:    cephALEXin (KEFLEX) 500 MG capsule, Take 1 capsule (500 mg  total) by mouth 4 (four) times daily for 5 days., Disp: 20 capsule, Rfl: 0   dexamethasone (DECADRON) 4 MG tablet, TAKE 1 TABLET DAILY. TAKE 1 TABLET DAY BEFORE CHEMO AND 1 TABLET DAY AFTER CHEMO WITH FOOD, Disp: 12 tablet, Rfl: 0   diphenoxylate-atropine (LOMOTIL) 2.5-0.025 MG tablet, Take 1 tablet by mouth 3 (three) times daily as needed for diarrhea or loose stools., Disp: 30 tablet, Rfl: 3   enoxaparin (LOVENOX) 60 MG/0.6ML injection, Inject 0.6 mLs (60 mg total) into the skin every 12 (twelve) hours. (Patient taking differently: Inject 60 mg into the skin every 12 (twelve) hours.), Disp: 6 mL, Rfl: 0   escitalopram (LEXAPRO) 20 MG tablet, Take 20 mg by mouth daily., Disp: , Rfl:    ibuprofen (ADVIL) 600 MG tablet, Take 1 tablet (600 mg total) by mouth every 6 (six) hours as needed., Disp: 30 tablet, Rfl: 0   lidocaine-prilocaine (EMLA) cream, Apply to affected area once, Disp: 30 g, Rfl: 3   loratadine (CLARITIN) 10 MG tablet, Take 10 mg by mouth daily., Disp: , Rfl:    LORazepam (ATIVAN) 0.5 MG tablet, TAKE 1 TABLET (0.5 MG TOTAL) BY MOUTH AT BEDTIME AS NEEDED (NAUSEA OR VOMITING)., Disp: 30 tablet, Rfl: 0   magic mouthwash w/lidocaine SOLN, Take 5 mLs by mouth 3 (three) times daily as needed for mouth pain., Disp: 100 mL, Rfl: 0   ondansetron (ZOFRAN) 8 MG tablet, TAKE 1 TABLET  2 TIMES DAILY AS NEEDED (NAUSEA OR VOMITING). START ON THE THIRD DAY AFTER CHEMO, Disp: 30 tablet, Rfl: 1   oxyCODONE (OXY IR/ROXICODONE) 5 MG immediate release tablet, Take 1 tablet (5 mg total) by mouth every 6 (six) hours as needed for up to 5 days for severe pain., Disp: 20 tablet, Rfl: 0   prochlorperazine (COMPAZINE) 10 MG tablet, Take 1 tablet (10 mg total) by mouth every 6 (six) hours as needed (Nausea or vomiting)., Disp: 30 tablet, Rfl: 1   rivaroxaban (XARELTO) 20 MG TABS tablet, Take 1 tablet (20 mg total) by mouth daily with supper., Disp: 30 tablet, Rfl: 5   valACYclovir (VALTREX) 500 MG tablet, Take 500 mg  by mouth 2 (two) times daily., Disp: , Rfl:   Past Medical Problems: Past Medical History:  Diagnosis Date   Anemia    Anxiety    Anxiety    no medication   Cancer ([place])    breast   Family history of prostate cancer 07/17/2021   History of DVT (deep vein thrombosis)     Past Surgical History: Past Surgical History:  Procedure Laterality Date   BREAST SURGERY     cyst removal from right breast    CESAREAN SECTION N/A 01/31/2021   Procedure: CESAREAN SECTION;  Surgeon: Dian Queen, MD;  Location: [facility];  Service: Obstetrics;  Laterality: N/A;   excision of fibroadenoma of breast     PORTACATH PLACEMENT N/A 07/11/2021   Procedure: INSERTION PORT-A-CATH;  Surgeon: Rolm Bookbinder, MD;  Location: [facility];  Service: General;  Laterality: N/A;   TONSILLECTOMY     and adenoids   TONSILLECTOMY AND ADENOIDECTOMY     WISDOM TOOTH EXTRACTION      Social History: Social History   Socioeconomic History   Marital status: Married    Spouse name: Not on file   Number of children: 0   Years of education: College   Highest education level: Not on file  Occupational History   Not on file  Tobacco Use   Smoking status: Former   Smokeless tobacco: Never  Vaping Use   Vaping Use: Never used  Substance and Sexual Activity   Alcohol use: Not Currently   Drug use: Never   Sexual activity: Yes    Birth control/protection: None  Other Topics Concern   Not on file  Social History Narrative   ** Merged History Encounter **       Lives at home Right handed Drinks 1 cup of coffee daily and a couple of sodas per month   Social Determinants of Health   Financial Resource Strain: Not on file  Food Insecurity: Not on file  Transportation Needs: Not on file  Physical Activity: Not on file  Stress: Not on file  Social Connections: Not on file  Intimate Partner Violence: Not on file    Family History: Family History  Problem Relation Age of Onset    Migraines Mother    Lymphoma Paternal Aunt        dx late 31s   Prostate cancer Maternal Grandfather        dx after 50    Review of Systems: Review of Systems  Constitutional: Negative.   Respiratory: Negative.    Cardiovascular: Negative.   Gastrointestinal: Negative.   Neurological: Negative.    Physical Exam: Vital Signs BP 110/73 (BP Location: Left Arm, Patient Position: Sitting, Cuff Size: Small)    Pulse 76    Ht 5\' 4"  (  1.626 m)    Wt 132 lb 12.8 oz (60.2 kg)    SpO2 98%    BMI 22.80 kg/m   Physical Exam  Constitutional:      General: Not in acute distress.    Appearance: Normal appearance. Not ill-appearing.  HENT:     Head: Normocephalic and atraumatic.  Eyes:     Pupils: Pupils are equal, round Neck:     Musculoskeletal: Normal range of motion.  Cardiovascular:     Rate and Rhythm: Normal rate    Pulses: Normal pulses.  Pulmonary:     Effort: Pulmonary effort is normal. No respiratory distress.  Musculoskeletal: Normal range of motion.  Skin:    General: Skin is warm and dry.     Findings: No erythema or rash.  Neurological:     General: No focal deficit present.     Mental Status: Alert and oriented to person, place, and time. Mental status is at baseline.     Motor: No weakness.  Psychiatric:        Mood and Affect: Mood normal.        Behavior: Behavior normal.    Assessment/Plan: The patient is scheduled for bilateral immediate breast reconstruction with placement of tissue expanders and Flex HD with Dr. Marla Roe.  Risks, benefits, and alternatives of procedure discussed, questions answered and consent obtained.    Smoking Status: Non-smoker; Counseling Given?  N/A  Caprini Score: 9, highest; Risk Factors include: History of DVT of left IJ, Port-A-Cath in place, history of cancer, and length of planned surgery. Recommendation for mechanical and chemoprophylaxis. Encourage early ambulation.    Pictures obtained: @consult   Post-op Rx sent to  pharmacy:  Oxycodone, Keflex  Patient was provided with the Breast Reconstruction and General Surgical Risk consent document and Pain Medication Agreement prior to their appointment.  They had adequate time to read through the risk consent documents and Pain Medication Agreement. We also discussed them in person together during this preop appointment. All of their questions were answered to their satisfaction.  Recommended calling if they have any further questions.  Risk consent form and Pain Medication Agreement to be scanned into patient's chart.  The risks that can be encountered with and after placement of a breast expander placement were discussed and include the following but not limited to these: bleeding, infection, delayed healing, anesthesia risks, skin sensation changes, injury to structures including nerves, blood vessels, and muscles which may be temporary or permanent, allergies to tape, suture materials and glues, blood products, topical preparations or injected agents, skin contour irregularities, skin discoloration and swelling, deep vein thrombosis, cardiac and pulmonary complications, pain, which may persist, fluid accumulation, wrinkling of the skin over the expander, changes in nipple or breast sensation, expander leakage or rupture, faulty position of the expander, persistent pain, formation of tight scar tissue around the expander (capsular contracture), possible need for revisional surgery or staged procedures.  Per medical clearance form from oncology, patient provided with Lovenox to begin 3 days prior to surgery for bridging from [place].    Electronically signed by: Carola Rhine Sharnese Heath, PA-C 12/01/2021 8:59 AM

## 2021-12-01 ENCOUNTER — Ambulatory Visit (INDEPENDENT_AMBULATORY_CARE_PROVIDER_SITE_OTHER): Payer: 59 | Admitting: Surgical

## 2021-12-01 ENCOUNTER — Encounter: Payer: Self-pay | Admitting: Surgical

## 2021-12-01 ENCOUNTER — Other Ambulatory Visit: Payer: Self-pay

## 2021-12-01 VITALS — BP 110/73 | HR 76 | Ht 64.0 in | Wt 132.8 lb

## 2021-12-01 DIAGNOSIS — C50411 Malignant neoplasm of upper-outer quadrant of right female breast: Secondary | ICD-10-CM

## 2021-12-01 DIAGNOSIS — Z170 Estrogen receptor positive status [ER+]: Secondary | ICD-10-CM

## 2021-12-01 MED ORDER — OXYCODONE HCL 5 MG PO TABS: 5.0000 mg | ORAL_TABLET | Freq: Four times a day (QID) | ORAL | 0 refills | Status: DC | PRN

## 2021-12-01 MED ORDER — CEPHALEXIN 500 MG PO CAPS: 500.0000 mg | ORAL_CAPSULE | Freq: Four times a day (QID) | ORAL | 0 refills | Status: DC

## 2021-12-01 MED ORDER — ENSURE PRE-SURGERY PO LIQD: 296.0000 mL | Freq: Once | ORAL | Status: DC

## 2021-12-01 NOTE — Progress Notes (Signed)

## 2021-12-04 ENCOUNTER — Encounter: Payer: Self-pay | Admitting: Hematology and Oncology

## 2021-12-05 ENCOUNTER — Ambulatory Visit
Admission: RE | Admit: 2021-12-05 | Discharge: 2021-12-05 | Disposition: A | Discharge status: Home / Self Care | Payer: 59 | Source: Ambulatory Visit | Attending: General Surgery | Admitting: General Surgery

## 2021-12-05 ENCOUNTER — Encounter (HOSPITAL_BASED_OUTPATIENT_CLINIC_OR_DEPARTMENT_OTHER): Payer: Self-pay | Admitting: General Surgery

## 2021-12-05 ENCOUNTER — Encounter: Payer: Self-pay | Admitting: Hematology and Oncology

## 2021-12-05 ENCOUNTER — Other Ambulatory Visit: Payer: Self-pay | Admitting: General Surgery

## 2021-12-05 DIAGNOSIS — C50411 Malignant neoplasm of upper-outer quadrant of right female breast: Secondary | ICD-10-CM

## 2021-12-05 DIAGNOSIS — Z170 Estrogen receptor positive status [ER+]: Secondary | ICD-10-CM

## 2021-12-05 IMAGING — US US NEEDLE LOCALIZATION*R*
1 series · 6 of 6 positions shown · non-contrast
Comparison: Previous exam(s).

CLINICAL DATA: Patient is scheduled for a targeted lymph node
dissection for the RIGHT axilla requiring preoperative radioactive
seed localization.

EXAM:
ULTRASOUND GUIDED RADIOACTIVE SEED LOCALIZATION OF THE RIGHT AXILLA

[Series 1: us needle localization*right* · 0.06mm/px · 6 of 6 slices shown]
[im 1/6]
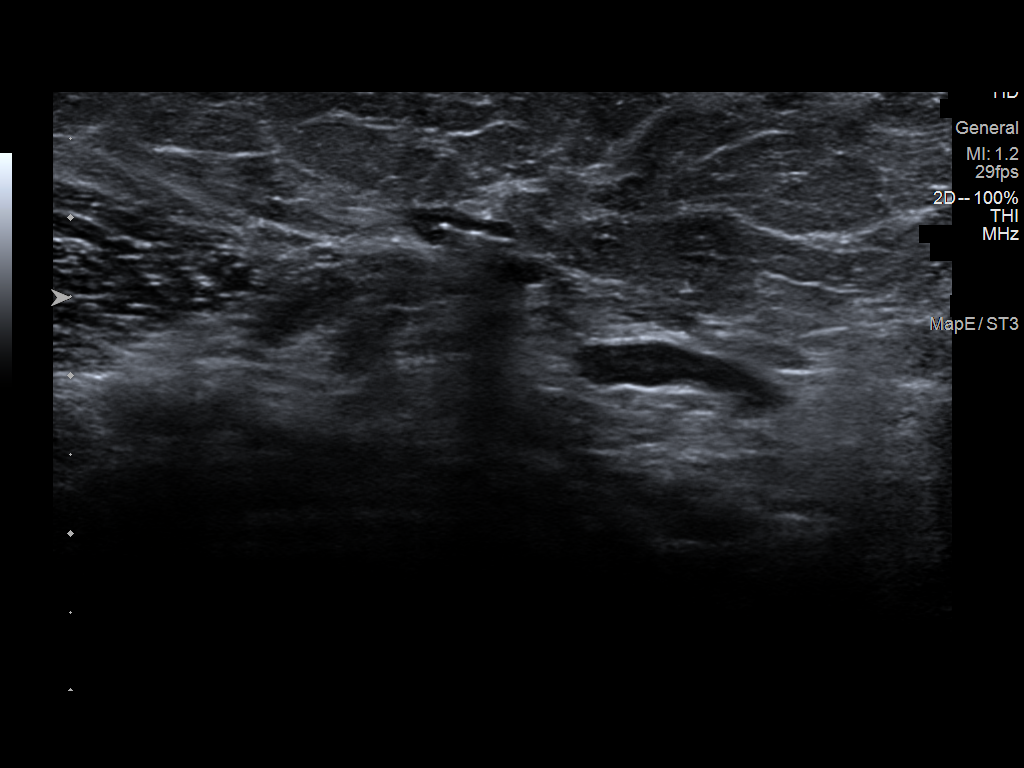
[im 2/6]
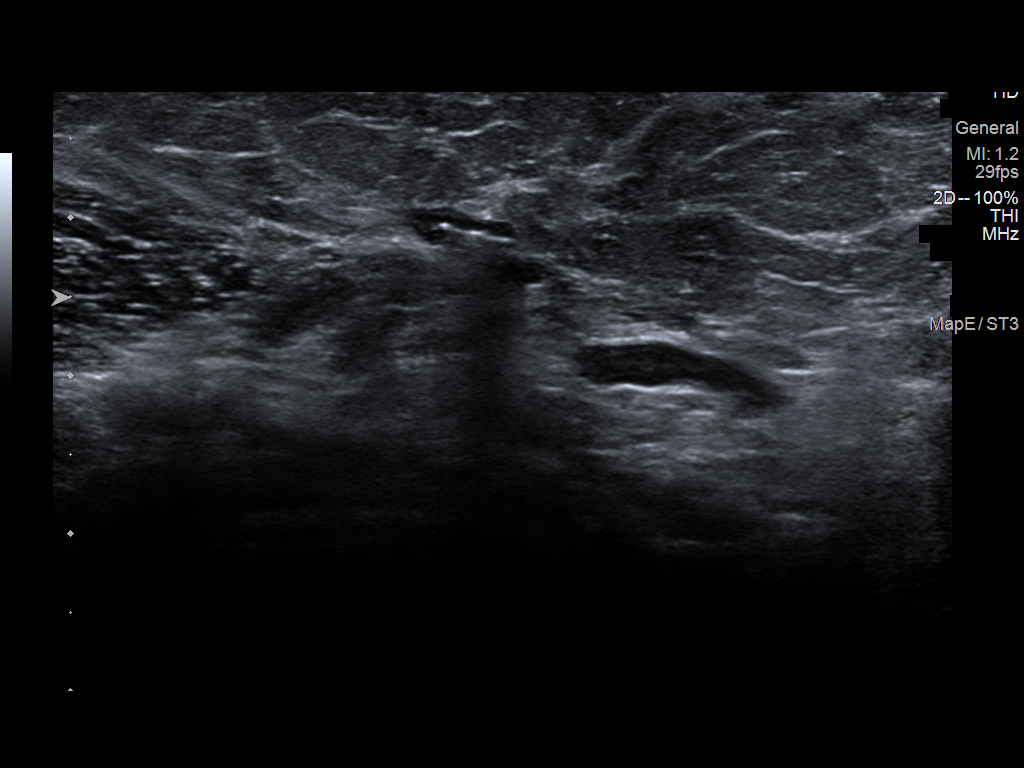
[im 3/6]
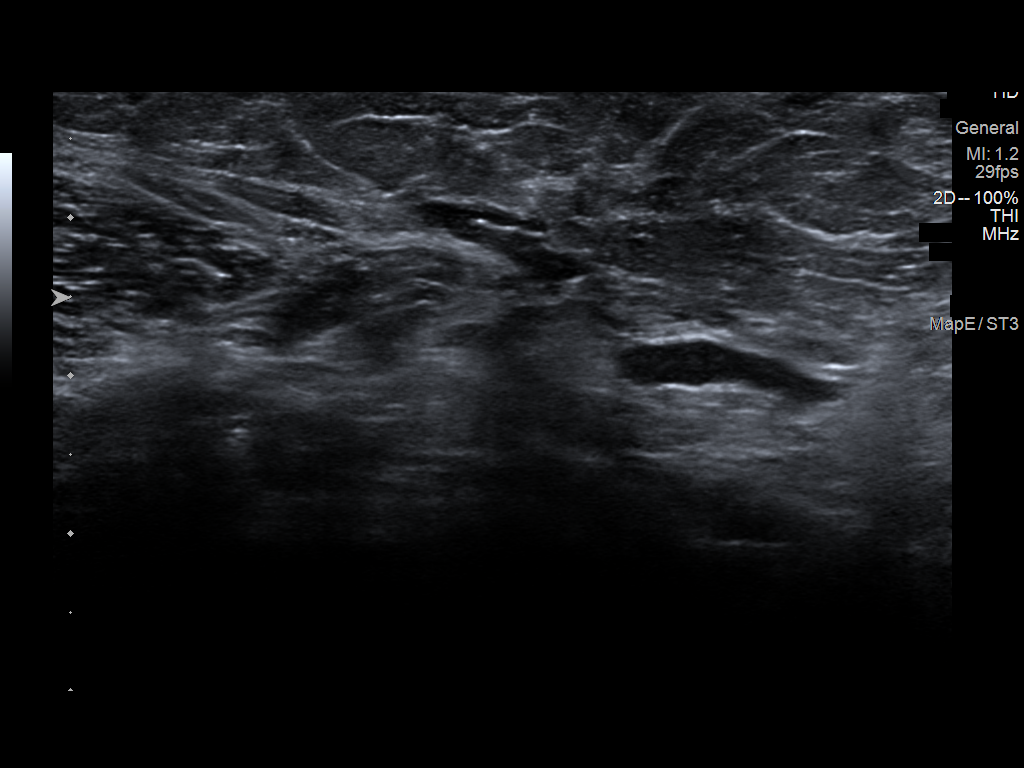
[im 4/6]
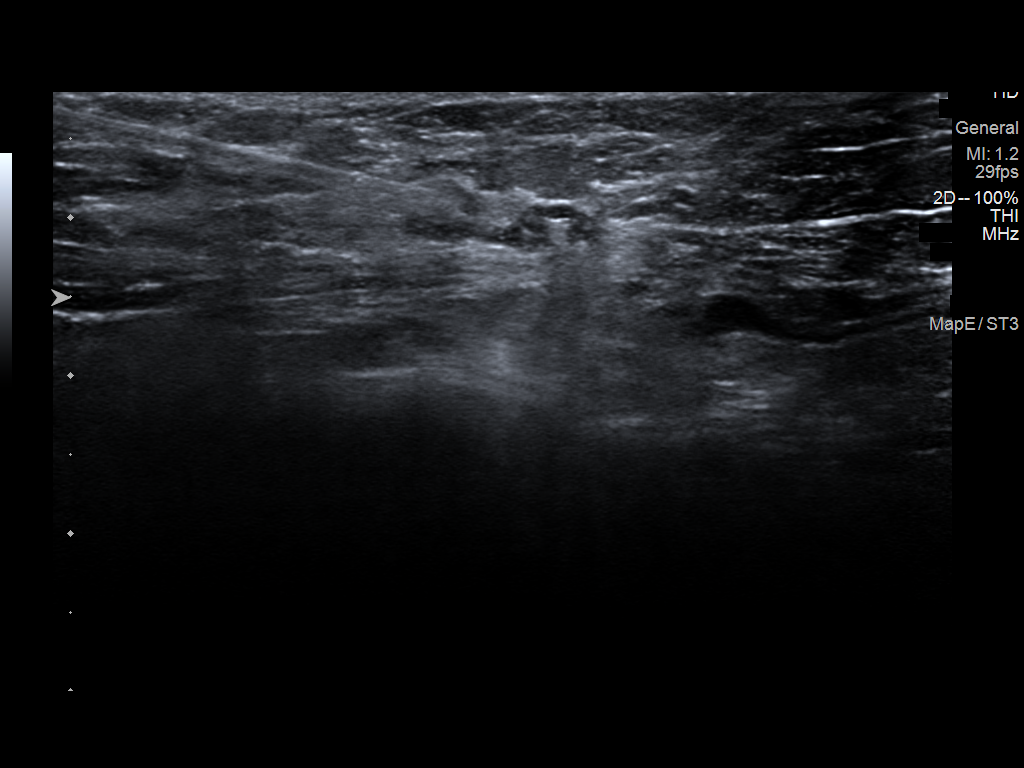
[im 5/6]
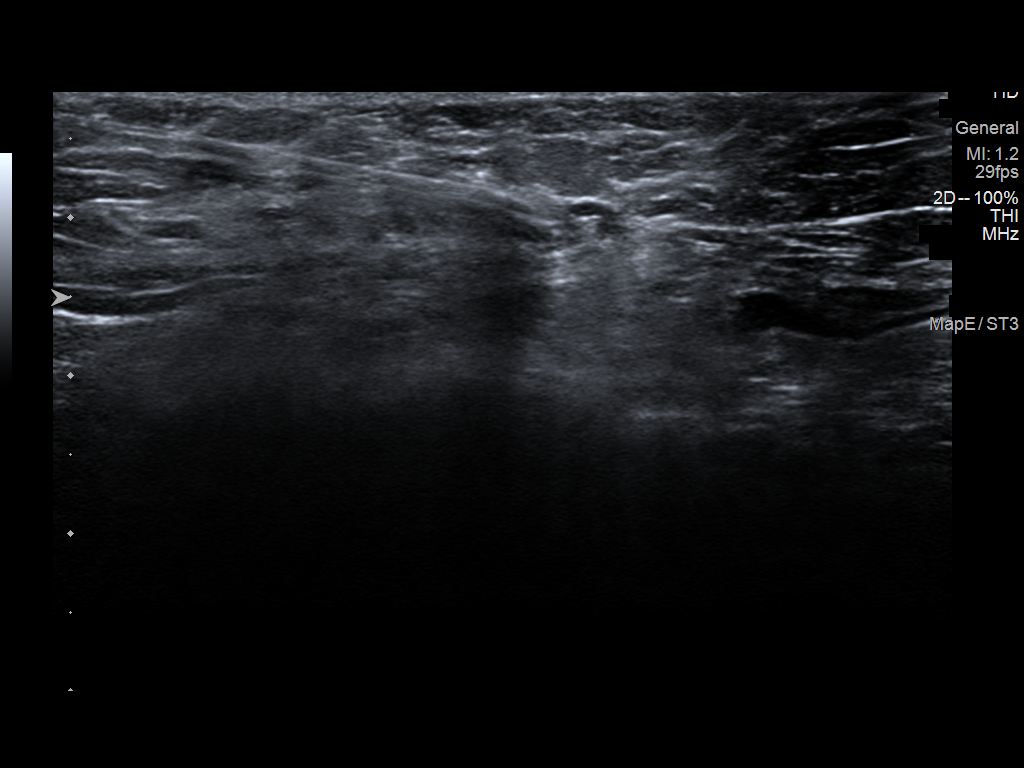
[im 6/6]
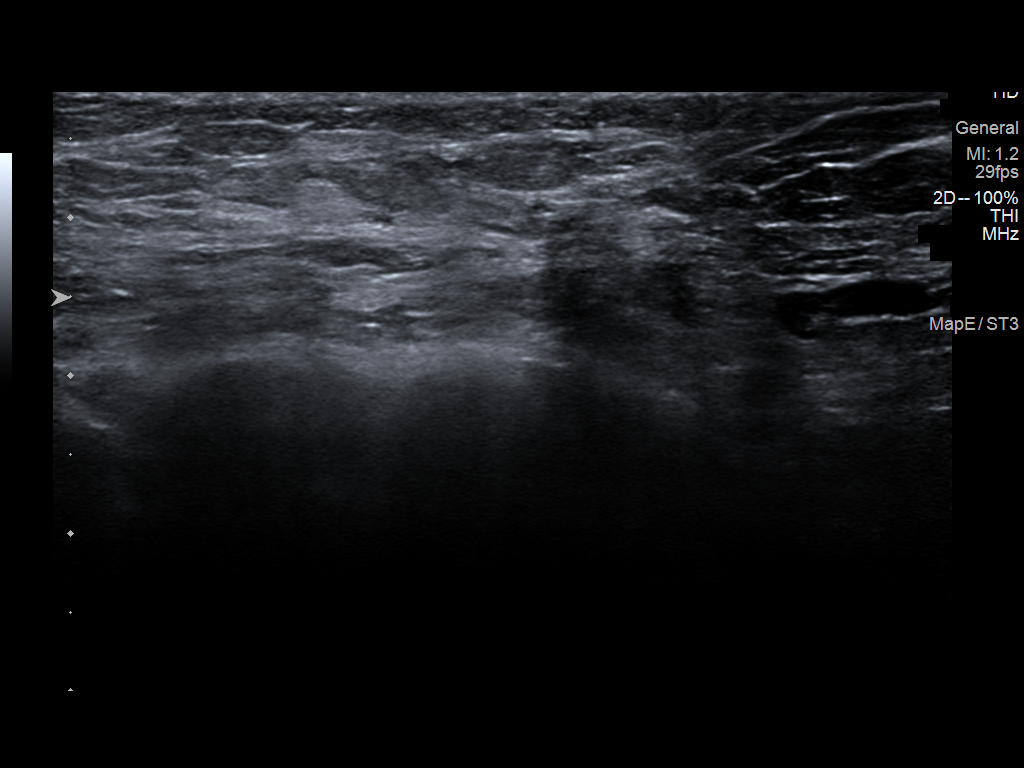

[6 of 6 positions shown; findings below may reference images not displayed]

FINDINGS: Patient presents for radioactive seed localization prior to
mastectomy and targeted lymph node dissection. I met with the
patient and we discussed the procedure of seed localization
including benefits and alternatives. We discussed the high
likelihood of a successful procedure. We discussed the risks of the
procedure including infection, bleeding, tissue injury and further
surgery. We discussed the low dose of radioactivity involved in the
procedure. Informed, written consent was given.

The usual time-out protocol was performed immediately prior to the
procedure.

Using ultrasound guidance, sterile technique, 1% lidocaine and an
[7R] radioactive seed, the biopsy clip within the RIGHT axillary
lymph node was localized using a lateral approach. Postprocedure
ultrasound shows the seed to be at the outer margin of the targeted
lymph node. The follow-up mammogram images confirm the seed in the
expected location and were marked for Dr. CHINDO.

Follow-up survey of the patient confirms presence of the radioactive
seed.

Order number of [7R] seed:  [PHONE_NUMBER].

Total activity:  0.245 millicuries reference Date: [DATE]

The patient tolerated the procedure well and was released from the
[REDACTED]. She was given instructions regarding seed removal.
IMPRESSION: Radioactive seed localization right axilla. No apparent
complications.

## 2021-12-05 IMAGING — MG MM BREAST LOCALIZATION CLIP
2 series · 3 of 6 positions shown · non-contrast
Comparison: Previous exam(s).

CLINICAL DATA: Status post radioactive seed localization of a RIGHT
axillary lymph node for targeted lymph node dissection.

EXAM:
DIAGNOSTIC RIGHT MAMMOGRAM POST ULTRASOUND-GUIDED RADIOACTIVE SEED
PLACEMENT

[R TAN synth-2D]
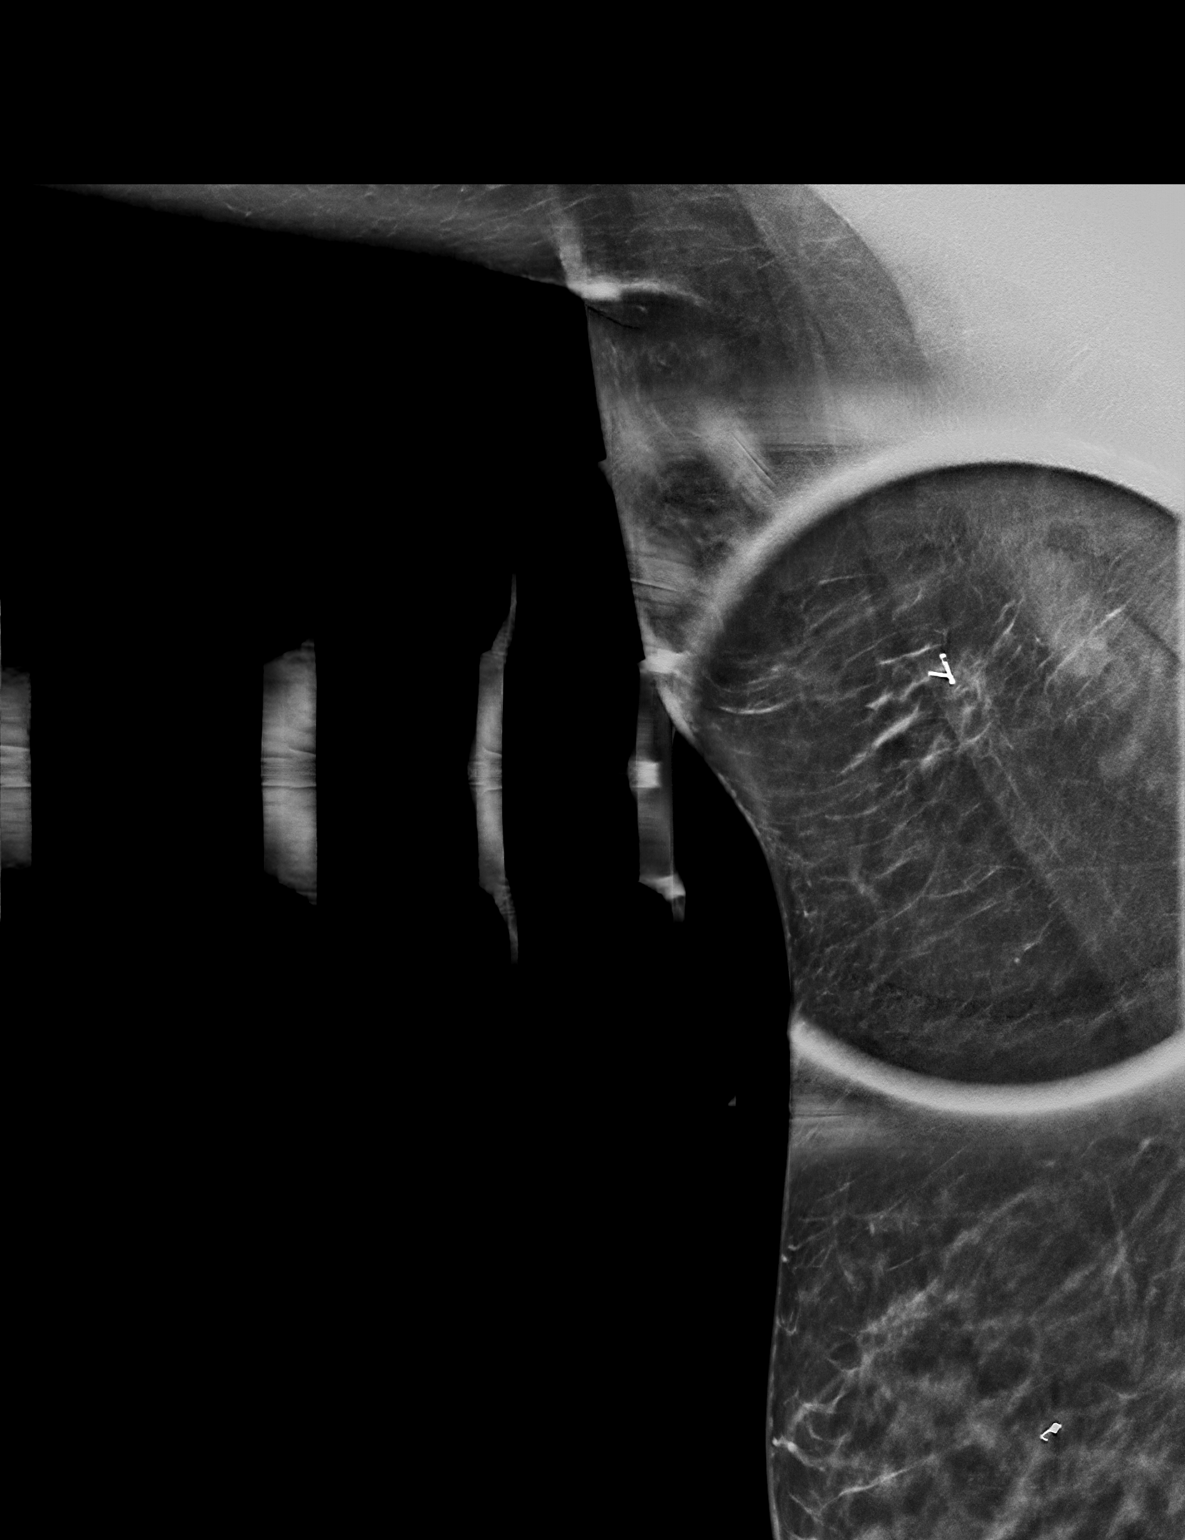

[R TAN tomo · 2 of 76 frames shown]
[frame 25/76]
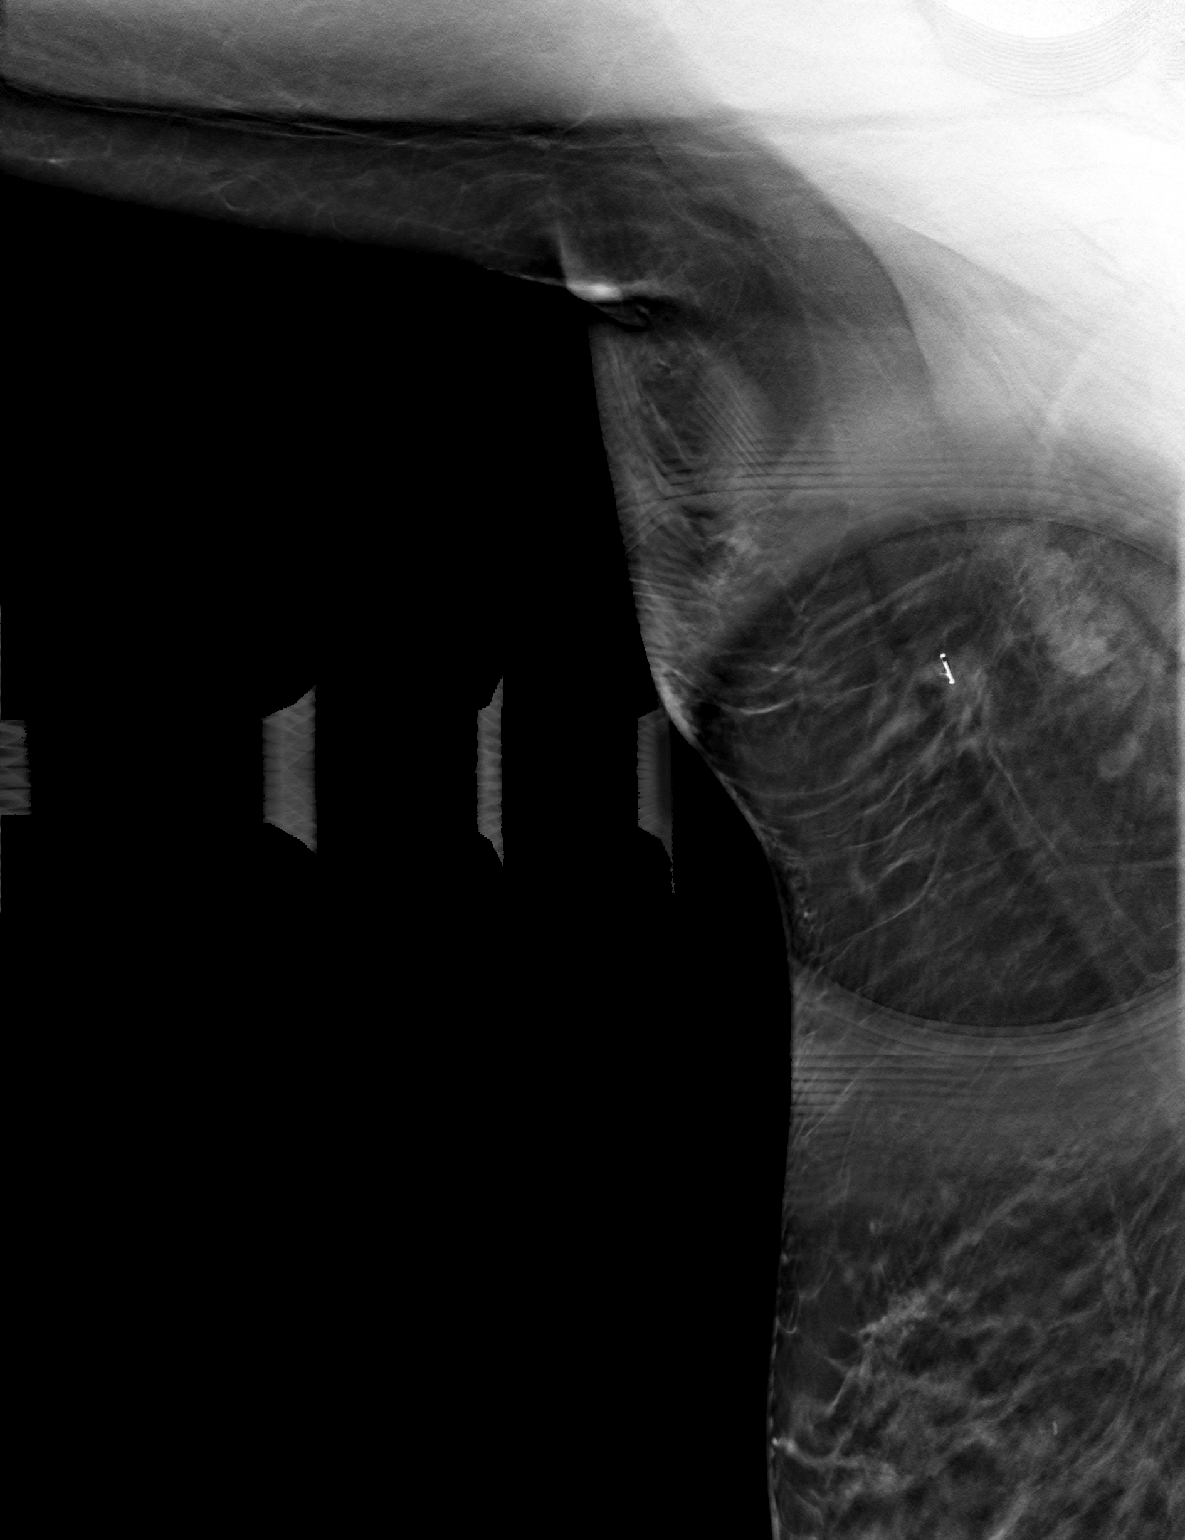
[frame 39/76]
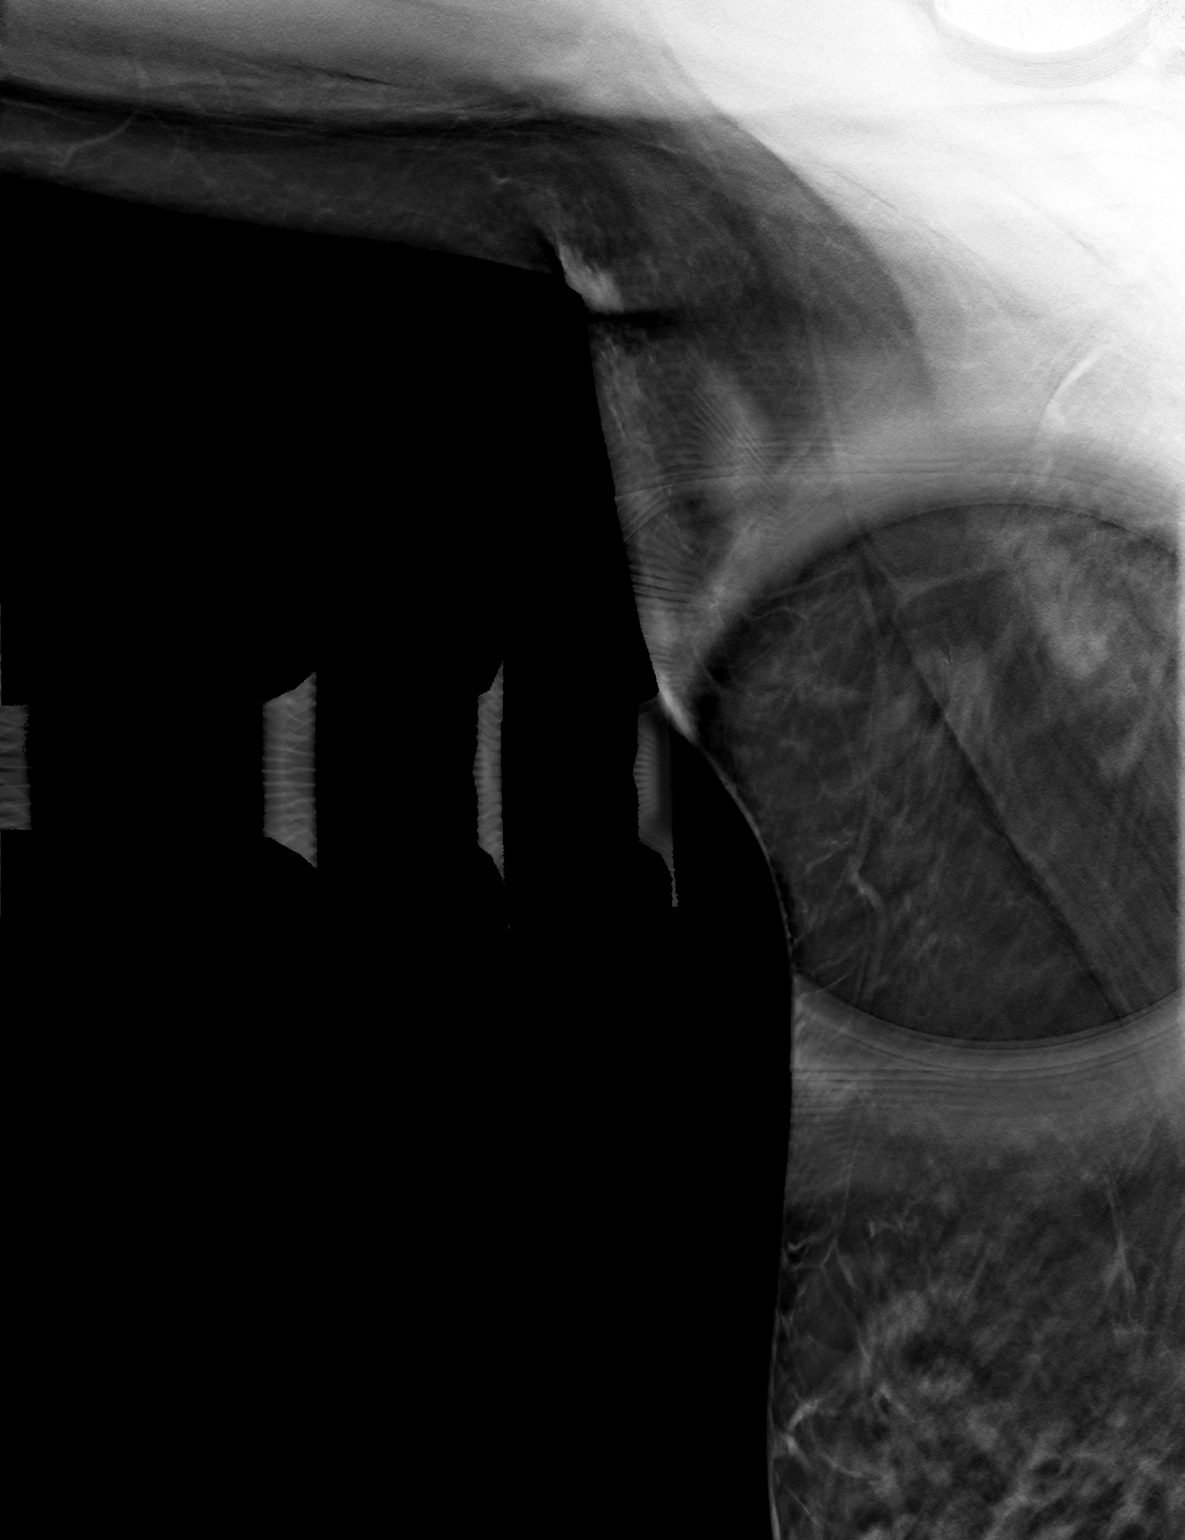

[3 of 6 positions shown; findings below may reference images not displayed]

FINDINGS: Mammographic images were obtained following ultrasound-guided
radioactive seed placement. These demonstrate the radioactive seed
in a good position immediately adjacent to the targeted axillary
lymph node.
IMPRESSION: Appropriate location of the radioactive seed.

Additional mildly prominent lymph node is seen within the RIGHT
axilla just deep to the lymph node that contains the biopsy clip and
radioactive seed. This was discussed with Dr. SABIMANA today.

Final Assessment: Post Procedure Mammograms for Seed Placement

## 2021-12-05 NOTE — Anesthesia Preprocedure Evaluation (Addendum)
Anesthesia Evaluation  ?Patient identified by MRN, date of birth, ID band ?Patient awake ? ? ? ?Reviewed: ?Allergy & Precautions, NPO status , Patient's Chart, lab work & pertinent test results ? ?Airway ?Mallampati: II ? ?TM Distance: >3 FB ?Neck ROM: Full ? ? ? Dental ?no notable dental hx. ?(+) Teeth Intact, Dental Advisory Given ?  ?Pulmonary ?former smoker,  ?  ?Pulmonary exam normal ?breath sounds clear to auscultation ? ? ? ? ? ? Cardiovascular ?+ DVT  ?Normal cardiovascular exam ?Rhythm:Regular Rate:Normal ? ? ?  ?Neuro/Psych ?Anxiety negative neurological ROS ?   ? GI/Hepatic ?negative GI ROS, Neg liver ROS,   ?Endo/Other  ?Right Breast Ca ? Renal/GU ?negative Renal ROS  ?negative genitourinary ?  ?Musculoskeletal ? ? Abdominal ?Normal abdominal exam  (+)   ?Peds ? Hematology ? ?(+) Blood dyscrasia, anemia , xarelto therapy- last dose 2/25 ?Lovenox 60mg  bid- last dose yesterday pm   ?Anesthesia Other Findings ? ? Reproductive/Obstetrics ? ?  ? ? ? ? ? ? ? ? ? ? ? ? ? ?  ?  ? ? ? ? ? ? ?Anesthesia Physical ?Anesthesia Plan ? ?ASA: 2 ? ?Anesthesia Plan: General  ? ?Post-op Pain Management: Regional block*, Celebrex PO (pre-op)*, Gabapentin PO (pre-op)*, Ketamine IV* and Precedex  ? ?Induction:  ? ?PONV Risk Score and Plan: 3 and Treatment may vary due to age or medical condition, Midazolam, Scopolamine patch - Pre-op and Ondansetron ? ?Airway Management Planned: LMA ? ?Additional Equipment:  ? ?Intra-op Plan:  ? ?Post-operative Plan: Extubation in OR ? ?Informed Consent: I have reviewed the patients History and Physical, chart, labs and discussed the procedure including the risks, benefits and alternatives for the proposed anesthesia with the patient or authorized representative who has indicated his/her understanding and acceptance.  ? ? ? ?Dental advisory given ? ?Plan Discussed with: CRNA and Anesthesiologist ? ?Anesthesia Plan Comments:   ? ? ? ? ? ?Anesthesia Quick  Evaluation ? ?

## 2021-12-06 ENCOUNTER — Ambulatory Visit
Admission: RE | Admit: 2021-12-06 | Discharge: 2021-12-06 | Disposition: A | Discharge status: Home / Self Care | Payer: 59 | Source: Ambulatory Visit | Attending: General Surgery | Admitting: General Surgery

## 2021-12-06 ENCOUNTER — Ambulatory Visit (HOSPITAL_BASED_OUTPATIENT_CLINIC_OR_DEPARTMENT_OTHER): Payer: 59 | Admitting: Anesthesiology

## 2021-12-06 ENCOUNTER — Encounter (HOSPITAL_BASED_OUTPATIENT_CLINIC_OR_DEPARTMENT_OTHER): Payer: Self-pay | Admitting: General Surgery

## 2021-12-06 ENCOUNTER — Encounter (HOSPITAL_BASED_OUTPATIENT_CLINIC_OR_DEPARTMENT_OTHER)
Admission: RE | Disposition: A | Discharge status: Home / Self Care | Payer: Self-pay | Source: Home / Self Care | Attending: Plastic Surgery

## 2021-12-06 ENCOUNTER — Other Ambulatory Visit: Payer: Self-pay

## 2021-12-06 ENCOUNTER — Observation Stay (HOSPITAL_BASED_OUTPATIENT_CLINIC_OR_DEPARTMENT_OTHER)
Admission: RE | Admit: 2021-12-06 | Discharge: 2021-12-07 | Disposition: A | Discharge status: Home / Self Care | Payer: 59 | Attending: Plastic Surgery | Admitting: Plastic Surgery

## 2021-12-06 DIAGNOSIS — N6032 Fibrosclerosis of left breast: Secondary | ICD-10-CM | POA: Diagnosis not present

## 2021-12-06 DIAGNOSIS — N62 Hypertrophy of breast: Secondary | ICD-10-CM | POA: Diagnosis not present

## 2021-12-06 DIAGNOSIS — C50911 Malignant neoplasm of unspecified site of right female breast: Secondary | ICD-10-CM | POA: Diagnosis not present

## 2021-12-06 DIAGNOSIS — C773 Secondary and unspecified malignant neoplasm of axilla and upper limb lymph nodes: Secondary | ICD-10-CM | POA: Diagnosis not present

## 2021-12-06 DIAGNOSIS — Z87891 Personal history of nicotine dependence: Secondary | ICD-10-CM | POA: Diagnosis not present

## 2021-12-06 DIAGNOSIS — C50411 Malignant neoplasm of upper-outer quadrant of right female breast: Secondary | ICD-10-CM

## 2021-12-06 DIAGNOSIS — C50919 Malignant neoplasm of unspecified site of unspecified female breast: Secondary | ICD-10-CM | POA: Diagnosis present

## 2021-12-06 DIAGNOSIS — C50912 Malignant neoplasm of unspecified site of left female breast: Secondary | ICD-10-CM

## 2021-12-06 DIAGNOSIS — Z170 Estrogen receptor positive status [ER+]: Secondary | ICD-10-CM | POA: Insufficient documentation

## 2021-12-06 HISTORY — PX: BREAST RECONSTRUCTION WITH PLACEMENT OF TISSUE EXPANDER AND FLEX HD (ACELLULAR HYDRATED DERMIS): SHX6295

## 2021-12-06 HISTORY — DX: Personal history of other venous thrombosis and embolism: Z86.718

## 2021-12-06 HISTORY — PX: RADIOACTIVE SEED GUIDED AXILLARY SENTINEL LYMPH NODE: SHX6735

## 2021-12-06 HISTORY — PX: TOTAL MASTECTOMY: SHX6129

## 2021-12-06 HISTORY — PX: SENTINEL NODE BIOPSY: SHX6608

## 2021-12-06 LAB — POCT PREGNANCY, URINE: Preg Test, Ur: NEGATIVE

## 2021-12-06 SURGERY — MASTECTOMY, SIMPLE
Anesthesia: General | Site: Breast | Laterality: Right

## 2021-12-06 MED ORDER — PROPOFOL 10 MG/ML IV BOLUS
INTRAVENOUS | Status: DC | PRN
  Administered 2021-12-06: 140 mg via INTRAVENOUS

## 2021-12-06 MED ORDER — DIAZEPAM 2 MG PO TABS
2.0000 mg | ORAL_TABLET | Freq: Two times a day (BID) | ORAL | Status: DC | PRN
  Administered 2021-12-06: 2 mg via ORAL
  Filled 2021-12-06: qty 1

## 2021-12-06 MED ORDER — SCOPOLAMINE 1 MG/3DAYS TD PT72
1.0000 | MEDICATED_PATCH | Freq: Once | TRANSDERMAL | Status: DC
  Administered 2021-12-06: 1.5 mg via TRANSDERMAL

## 2021-12-06 MED ORDER — DEXAMETHASONE SODIUM PHOSPHATE 10 MG/ML IJ SOLN
INTRAMUSCULAR | Status: DC | PRN
Start: 2021-12-06 — End: 2021-12-06
  Administered 2021-12-06: 4 mg via INTRAVENOUS

## 2021-12-06 MED ORDER — 0.9 % SODIUM CHLORIDE (POUR BTL) OPTIME
TOPICAL | Status: DC | PRN
  Administered 2021-12-06: 200 mL

## 2021-12-06 MED ORDER — EPHEDRINE SULFATE (PRESSORS) 50 MG/ML IJ SOLN
INTRAMUSCULAR | Status: DC | PRN
  Administered 2021-12-06: 10 mg via INTRAVENOUS
  Administered 2021-12-06: 5 mg via INTRAVENOUS
  Administered 2021-12-06: 10 mg via INTRAVENOUS

## 2021-12-06 MED ORDER — BUPIVACAINE HCL (PF) 0.25 % IJ SOLN
INTRAMUSCULAR | Status: DC | PRN
  Administered 2021-12-06 (×2): 15 mL via PERINEURAL

## 2021-12-06 MED ORDER — ONDANSETRON HCL 4 MG/2ML IJ SOLN
INTRAMUSCULAR | Status: AC
  Filled 2021-12-06: qty 2

## 2021-12-06 MED ORDER — KETAMINE HCL 10 MG/ML IJ SOLN
INTRAMUSCULAR | Status: DC | PRN
  Administered 2021-12-06: 15 mg via INTRAVENOUS
  Administered 2021-12-06: 10 mg via INTRAVENOUS

## 2021-12-06 MED ORDER — ACETAMINOPHEN 325 MG PO TABS
325.0000 mg | ORAL_TABLET | Freq: Four times a day (QID) | ORAL | Status: DC
  Administered 2021-12-06 – 2021-12-07 (×2): 325 mg via ORAL
  Filled 2021-12-06 (×2): qty 1

## 2021-12-06 MED ORDER — CELECOXIB 200 MG PO CAPS
400.0000 mg | ORAL_CAPSULE | ORAL | Status: AC
  Administered 2021-12-06: 400 mg via ORAL

## 2021-12-06 MED ORDER — OXYCODONE HCL 5 MG/5ML PO SOLN: 5.0000 mg | Freq: Once | ORAL | Status: DC | PRN

## 2021-12-06 MED ORDER — PROPOFOL 500 MG/50ML IV EMUL
INTRAVENOUS | Status: AC
  Filled 2021-12-06: qty 50

## 2021-12-06 MED ORDER — LACTATED RINGERS IV SOLN: INTRAVENOUS | Status: DC

## 2021-12-06 MED ORDER — FENTANYL CITRATE (PF) 100 MCG/2ML IJ SOLN
INTRAMUSCULAR | Status: AC
Start: 2021-12-06 — End: ?
  Filled 2021-12-06: qty 2

## 2021-12-06 MED ORDER — DEXMEDETOMIDINE (PRECEDEX) IN NS 20 MCG/5ML (4 MCG/ML) IV SYRINGE
PREFILLED_SYRINGE | INTRAVENOUS | Status: AC
  Filled 2021-12-06: qty 5

## 2021-12-06 MED ORDER — ACETAMINOPHEN 500 MG PO TABS
1000.0000 mg | ORAL_TABLET | ORAL | Status: AC
  Administered 2021-12-06: 1000 mg via ORAL

## 2021-12-06 MED ORDER — SODIUM CHLORIDE 0.9 % IV SOLN
INTRAVENOUS | Status: DC | PRN
  Administered 2021-12-06: 500 mL

## 2021-12-06 MED ORDER — MORPHINE SULFATE (PF) 4 MG/ML IV SOLN: 2.0000 mg | INTRAVENOUS | Status: DC | PRN

## 2021-12-06 MED ORDER — HEMOSTATIC AGENTS (NO CHARGE) OPTIME
TOPICAL | Status: DC | PRN
  Administered 2021-12-06: 2 via TOPICAL

## 2021-12-06 MED ORDER — FENTANYL CITRATE (PF) 100 MCG/2ML IJ SOLN
INTRAMUSCULAR | Status: DC | PRN
Start: 2021-12-06 — End: 2021-12-06
  Administered 2021-12-06: 25 ug via INTRAVENOUS
  Administered 2021-12-06: 50 ug via INTRAVENOUS
  Administered 2021-12-06: 25 ug via INTRAVENOUS

## 2021-12-06 MED ORDER — ONDANSETRON 4 MG PO TBDP: 4.0000 mg | ORAL_TABLET | Freq: Four times a day (QID) | ORAL | Status: DC | PRN

## 2021-12-06 MED ORDER — ESCITALOPRAM OXALATE 20 MG PO TABS
20.0000 mg | ORAL_TABLET | Freq: Every day | ORAL | Status: DC
Start: 2021-12-06 — End: 2021-12-07
  Administered 2021-12-06: 20 mg via ORAL
  Filled 2021-12-06: qty 1

## 2021-12-06 MED ORDER — CHLORHEXIDINE GLUCONATE CLOTH 2 % EX PADS: 6.0000 | MEDICATED_PAD | Freq: Once | CUTANEOUS | Status: DC

## 2021-12-06 MED ORDER — ONDANSETRON HCL 4 MG/2ML IJ SOLN
INTRAMUSCULAR | Status: DC | PRN
  Administered 2021-12-06: 4 mg via INTRAVENOUS

## 2021-12-06 MED ORDER — MAGTRACE LYMPHATIC TRACER
INTRAMUSCULAR | Status: DC | PRN
  Administered 2021-12-06: 2 mL via INTRAMUSCULAR

## 2021-12-06 MED ORDER — SODIUM CHLORIDE (PF) 0.9 % IJ SOLN
INTRAMUSCULAR | Status: AC
  Filled 2021-12-06: qty 10

## 2021-12-06 MED ORDER — SODIUM CHLORIDE 0.9 % IV SOLN
INTRAVENOUS | Status: AC
  Filled 2021-12-06: qty 10

## 2021-12-06 MED ORDER — SENNA 8.6 MG PO TABS
1.0000 | ORAL_TABLET | Freq: Two times a day (BID) | ORAL | Status: DC
  Administered 2021-12-06: 8.6 mg via ORAL
  Filled 2021-12-06: qty 1

## 2021-12-06 MED ORDER — MIDAZOLAM HCL 2 MG/2ML IJ SOLN
INTRAMUSCULAR | Status: AC
  Filled 2021-12-06: qty 2

## 2021-12-06 MED ORDER — DIPHENHYDRAMINE HCL 50 MG/ML IJ SOLN: 12.5000 mg | Freq: Four times a day (QID) | INTRAMUSCULAR | Status: DC | PRN

## 2021-12-06 MED ORDER — CEFAZOLIN SODIUM-DEXTROSE 2-4 GM/100ML-% IV SOLN
2.0000 g | Freq: Three times a day (TID) | INTRAVENOUS | Status: DC
  Administered 2021-12-06 – 2021-12-07 (×2): 2 g via INTRAVENOUS
  Filled 2021-12-06 (×2): qty 100

## 2021-12-06 MED ORDER — PROPOFOL 10 MG/ML IV BOLUS
INTRAVENOUS | Status: AC
  Filled 2021-12-06: qty 20

## 2021-12-06 MED ORDER — DROPERIDOL 2.5 MG/ML IJ SOLN: 0.6250 mg | Freq: Once | INTRAMUSCULAR | Status: DC | PRN

## 2021-12-06 MED ORDER — ACETAMINOPHEN 500 MG PO TABS
ORAL_TABLET | ORAL | Status: AC
  Filled 2021-12-06: qty 2

## 2021-12-06 MED ORDER — HYDROMORPHONE HCL 1 MG/ML IJ SOLN
INTRAMUSCULAR | Status: AC
  Filled 2021-12-06: qty 0.5

## 2021-12-06 MED ORDER — RIVAROXABAN 20 MG PO TABS
20.0000 mg | ORAL_TABLET | Freq: Every day | ORAL | Status: DC
Start: 2021-12-06 — End: 2021-12-07
  Administered 2021-12-06: 20 mg via ORAL
  Filled 2021-12-06: qty 1

## 2021-12-06 MED ORDER — SODIUM CHLORIDE 0.9 % IV SOLN
INTRAVENOUS | Status: AC | PRN
Start: 2021-12-06 — End: 2021-12-06
  Administered 2021-12-06: 500 mL

## 2021-12-06 MED ORDER — LIDOCAINE HCL (CARDIAC) PF 100 MG/5ML IV SOSY
PREFILLED_SYRINGE | INTRAVENOUS | Status: DC | PRN
  Administered 2021-12-06: 50 mg via INTRATRACHEAL

## 2021-12-06 MED ORDER — CEFAZOLIN SODIUM-DEXTROSE 2-4 GM/100ML-% IV SOLN
2.0000 g | INTRAVENOUS | Status: AC
  Administered 2021-12-06: 2 g via INTRAVENOUS

## 2021-12-06 MED ORDER — CELECOXIB 200 MG PO CAPS
ORAL_CAPSULE | ORAL | Status: AC
  Filled 2021-12-06: qty 2

## 2021-12-06 MED ORDER — KETAMINE HCL 100 MG/ML IJ SOLN
INTRAMUSCULAR | Status: AC
  Filled 2021-12-06: qty 1

## 2021-12-06 MED ORDER — HYDROCODONE-ACETAMINOPHEN 5-325 MG PO TABS
1.0000 | ORAL_TABLET | ORAL | Status: DC | PRN
  Administered 2021-12-06 (×2): 1 via ORAL
  Filled 2021-12-06 (×2): qty 1

## 2021-12-06 MED ORDER — FENTANYL CITRATE (PF) 100 MCG/2ML IJ SOLN
100.0000 ug | Freq: Once | INTRAMUSCULAR | Status: AC
  Administered 2021-12-06: 100 ug via INTRAVENOUS

## 2021-12-06 MED ORDER — DEXMEDETOMIDINE (PRECEDEX) IN NS 20 MCG/5ML (4 MCG/ML) IV SYRINGE
PREFILLED_SYRINGE | INTRAVENOUS | Status: DC | PRN
  Administered 2021-12-06 (×2): 4 ug via INTRAVENOUS

## 2021-12-06 MED ORDER — PROPOFOL 500 MG/50ML IV EMUL
INTRAVENOUS | Status: DC | PRN
  Administered 2021-12-06: 25 ug/kg/min via INTRAVENOUS

## 2021-12-06 MED ORDER — EPHEDRINE 5 MG/ML INJ
INTRAVENOUS | Status: AC
  Filled 2021-12-06: qty 5

## 2021-12-06 MED ORDER — POLYETHYLENE GLYCOL 3350 17 G PO PACK: 17.0000 g | PACK | Freq: Every day | ORAL | Status: DC | PRN

## 2021-12-06 MED ORDER — FENTANYL CITRATE (PF) 100 MCG/2ML IJ SOLN
INTRAMUSCULAR | Status: AC
  Filled 2021-12-06: qty 2

## 2021-12-06 MED ORDER — PHENYLEPHRINE 40 MCG/ML (10ML) SYRINGE FOR IV PUSH (FOR BLOOD PRESSURE SUPPORT)
PREFILLED_SYRINGE | INTRAVENOUS | Status: AC
  Filled 2021-12-06: qty 10

## 2021-12-06 MED ORDER — KCL IN DEXTROSE-NACL 20-5-0.45 MEQ/L-%-% IV SOLN
INTRAVENOUS | Status: DC
  Filled 2021-12-06: qty 1000

## 2021-12-06 MED ORDER — CEFAZOLIN SODIUM-DEXTROSE 2-4 GM/100ML-% IV SOLN: 2.0000 g | INTRAVENOUS | Status: AC

## 2021-12-06 MED ORDER — METHOCARBAMOL 500 MG PO TABS
500.0000 mg | ORAL_TABLET | Freq: Four times a day (QID) | ORAL | Status: DC | PRN
  Administered 2021-12-06 – 2021-12-07 (×3): 500 mg via ORAL
  Filled 2021-12-06 (×3): qty 1

## 2021-12-06 MED ORDER — MELATONIN 3 MG PO TABS
3.0000 mg | ORAL_TABLET | Freq: Every evening | ORAL | Status: DC | PRN
  Administered 2021-12-06: 3 mg via ORAL
  Filled 2021-12-06: qty 1

## 2021-12-06 MED ORDER — ONDANSETRON HCL 4 MG/2ML IJ SOLN
4.0000 mg | Freq: Four times a day (QID) | INTRAMUSCULAR | Status: DC | PRN
  Administered 2021-12-07: 4 mg via INTRAVENOUS
  Filled 2021-12-06: qty 2

## 2021-12-06 MED ORDER — OXYCODONE HCL 5 MG PO TABS: 5.0000 mg | ORAL_TABLET | Freq: Once | ORAL | Status: DC | PRN

## 2021-12-06 MED ORDER — ONDANSETRON HCL 4 MG/2ML IJ SOLN
4.0000 mg | Freq: Once | INTRAMUSCULAR | Status: AC | PRN
  Administered 2021-12-06: 4 mg via INTRAVENOUS

## 2021-12-06 MED ORDER — IBUPROFEN 200 MG PO TABS: 400.0000 mg | ORAL_TABLET | Freq: Four times a day (QID) | ORAL | Status: DC

## 2021-12-06 MED ORDER — DEXAMETHASONE SODIUM PHOSPHATE 10 MG/ML IJ SOLN
INTRAMUSCULAR | Status: AC
  Filled 2021-12-06: qty 1

## 2021-12-06 MED ORDER — PHENYLEPHRINE HCL (PRESSORS) 10 MG/ML IV SOLN
INTRAVENOUS | Status: DC | PRN
  Administered 2021-12-06 (×4): 80 ug via INTRAVENOUS

## 2021-12-06 MED ORDER — DIPHENHYDRAMINE HCL 12.5 MG/5ML PO ELIX: 12.5000 mg | ORAL_SOLUTION | Freq: Four times a day (QID) | ORAL | Status: DC | PRN

## 2021-12-06 MED ORDER — MIDAZOLAM HCL 2 MG/2ML IJ SOLN
2.0000 mg | Freq: Once | INTRAMUSCULAR | Status: AC
Start: 2021-12-06 — End: 2021-12-06
  Administered 2021-12-06: 2 mg via INTRAVENOUS

## 2021-12-06 MED ORDER — SCOPOLAMINE 1 MG/3DAYS TD PT72
MEDICATED_PATCH | TRANSDERMAL | Status: AC
  Filled 2021-12-06: qty 1

## 2021-12-06 MED ORDER — HYDROMORPHONE HCL 1 MG/ML IJ SOLN
0.2500 mg | INTRAMUSCULAR | Status: DC | PRN
  Administered 2021-12-06 (×3): 0.5 mg via INTRAVENOUS

## 2021-12-06 MED ORDER — LIDOCAINE 2% (20 MG/ML) 5 ML SYRINGE
INTRAMUSCULAR | Status: AC
  Filled 2021-12-06: qty 5

## 2021-12-06 MED ORDER — VALACYCLOVIR HCL 500 MG PO TABS
500.0000 mg | ORAL_TABLET | Freq: Two times a day (BID) | ORAL | Status: DC
  Administered 2021-12-06: 500 mg via ORAL
  Filled 2021-12-06: qty 1

## 2021-12-06 MED ORDER — BUPIVACAINE LIPOSOME 1.3 % IJ SUSP
INTRAMUSCULAR | Status: DC | PRN
  Administered 2021-12-06 (×2): 10 mL via PERINEURAL

## 2021-12-06 MED ORDER — CEFAZOLIN SODIUM-DEXTROSE 2-4 GM/100ML-% IV SOLN
INTRAVENOUS | Status: AC
  Filled 2021-12-06: qty 100

## 2021-12-06 SURGICAL SUPPLY — 111 items
ADH SKN CLS APL DERMABOND .7 (GAUZE/BANDAGES/DRESSINGS) ×4
APL PRP STRL LF DISP 70% ISPRP (MISCELLANEOUS) ×2
APL SKNCLS STERI-STRIP NONHPOA (GAUZE/BANDAGES/DRESSINGS)
APPLIER CLIP 11 MED OPEN (CLIP) ×3
APPLIER CLIP 9.375 MED OPEN (MISCELLANEOUS) ×3
APR CLP MED 11 20 MLT OPN (CLIP) ×2
APR CLP MED 9.3 20 MLT OPN (MISCELLANEOUS) ×2
BAG DECANTER FOR FLEXI CONT (MISCELLANEOUS) ×4 IMPLANT
BENZOIN TINCTURE PRP APPL 2/3 (GAUZE/BANDAGES/DRESSINGS) IMPLANT
BINDER BREAST LRG (GAUZE/BANDAGES/DRESSINGS) ×1 IMPLANT
BINDER BREAST MEDIUM (GAUZE/BANDAGES/DRESSINGS) IMPLANT
BINDER BREAST XLRG (GAUZE/BANDAGES/DRESSINGS) IMPLANT
BINDER BREAST XXLRG (GAUZE/BANDAGES/DRESSINGS) IMPLANT
BIOPATCH RED 1 DISK 7.0 (GAUZE/BANDAGES/DRESSINGS) ×5 IMPLANT
BLADE CLIPPER SURG (BLADE) IMPLANT
BLADE HEX COATED 2.75 (ELECTRODE) ×3 IMPLANT
BLADE SURG 10 STRL SS (BLADE) ×4 IMPLANT
BLADE SURG 15 STRL LF DISP TIS (BLADE) ×3 IMPLANT
BLADE SURG 15 STRL SS (BLADE) ×6
BNDG GAUZE ELAST 4 BULKY (GAUZE/BANDAGES/DRESSINGS) ×6 IMPLANT
CANISTER SUCT 1200ML W/VALVE (MISCELLANEOUS) ×4 IMPLANT
CHLORAPREP W/TINT 26 (MISCELLANEOUS) ×4 IMPLANT
CLIP APPLIE 11 MED OPEN (CLIP) IMPLANT
CLIP APPLIE 9.375 MED OPEN (MISCELLANEOUS) IMPLANT
CLIP TI WIDE RED SMALL 6 (CLIP) IMPLANT
COVER BACK TABLE 60X90IN (DRAPES) ×4 IMPLANT
COVER MAYO STAND STRL (DRAPES) ×5 IMPLANT
COVER PROBE W GEL 5X96 (DRAPES) ×5 IMPLANT
DERMABOND ADVANCED (GAUZE/BANDAGES/DRESSINGS) ×2
DERMABOND ADVANCED .7 DNX12 (GAUZE/BANDAGES/DRESSINGS) ×3 IMPLANT
DRAIN CHANNEL 19F RND (DRAIN) ×5 IMPLANT
DRAPE LAPAROSCOPIC ABDOMINAL (DRAPES) ×3 IMPLANT
DRAPE TOP ARMCOVERS (MISCELLANEOUS) ×4 IMPLANT
DRAPE U-SHAPE 76X120 STRL (DRAPES) ×4 IMPLANT
DRAPE UTILITY XL STRL (DRAPES) ×4 IMPLANT
DRSG OPSITE POSTOP 4X6 (GAUZE/BANDAGES/DRESSINGS) IMPLANT
DRSG PAD ABDOMINAL 8X10 ST (GAUZE/BANDAGES/DRESSINGS) ×10 IMPLANT
DRSG TEGADERM 4X4.75 (GAUZE/BANDAGES/DRESSINGS) IMPLANT
ELECT BLADE 4.0 EZ CLEAN MEGAD (MISCELLANEOUS) ×3
ELECT REM PT RETURN 9FT ADLT (ELECTROSURGICAL) ×6
ELECTRODE BLDE 4.0 EZ CLN MEGD (MISCELLANEOUS) ×3 IMPLANT
ELECTRODE REM PT RTRN 9FT ADLT (ELECTROSURGICAL) ×3 IMPLANT
EVACUATOR SILICONE 100CC (DRAIN) ×4 IMPLANT
FUNNEL KELLER 2 DISP (MISCELLANEOUS) IMPLANT
GAUZE SPONGE 4X4 12PLY STRL LF (GAUZE/BANDAGES/DRESSINGS) IMPLANT
GLOVE SRG 8 PF TXTR STRL LF DI (GLOVE) IMPLANT
GLOVE SURG ENC MOIS LTX SZ6.5 (GLOVE) ×9 IMPLANT
GLOVE SURG ENC MOIS LTX SZ7 (GLOVE) ×5 IMPLANT
GLOVE SURG ENC MOIS LTX SZ7.5 (GLOVE) ×1 IMPLANT
GLOVE SURG UNDER POLY LF SZ7 (GLOVE) ×2 IMPLANT
GLOVE SURG UNDER POLY LF SZ7.5 (GLOVE) ×4 IMPLANT
GLOVE SURG UNDER POLY LF SZ8 (GLOVE) ×3
GOWN STRL REUS W/ TWL LRG LVL3 (GOWN DISPOSABLE) ×9 IMPLANT
GOWN STRL REUS W/ TWL XL LVL3 (GOWN DISPOSABLE) IMPLANT
GOWN STRL REUS W/TWL LRG LVL3 (GOWN DISPOSABLE) ×12
GOWN STRL REUS W/TWL XL LVL3 (GOWN DISPOSABLE) ×9
GRAFT FLEX HD 19X22X0.7-1.4 (Tissue) ×2 IMPLANT
HEMOSTAT ARISTA ABSORB 3G PWDR (HEMOSTASIS) ×2 IMPLANT
IMPL EXPANDER BREAST 455CC (Breast) IMPLANT
IMPLANT BREAST 455CC (Breast) ×2 IMPLANT
IMPLANT EXPANDER BREAST 455CC (Breast) ×4 IMPLANT
IV NS 1000ML (IV SOLUTION) ×3
IV NS 1000ML BAXH (IV SOLUTION) IMPLANT
IV NS 500ML (IV SOLUTION)
IV NS 500ML BAXH (IV SOLUTION) ×3 IMPLANT
KIT FILL ASEPTIC TRANSFER (MISCELLANEOUS) ×2 IMPLANT
KIT FILL SYSTEM UNIVERSAL (SET/KITS/TRAYS/PACK) ×1 IMPLANT
LIGHT WAVEGUIDE WIDE FLAT (MISCELLANEOUS) ×1 IMPLANT
NDL HYPO 25X1 1.5 SAFETY (NEEDLE) IMPLANT
NDL SAFETY ECLIPSE 18X1.5 (NEEDLE) IMPLANT
NEEDLE HYPO 18GX1.5 SHARP (NEEDLE)
NEEDLE HYPO 25X1 1.5 SAFETY (NEEDLE) ×3 IMPLANT
NS IRRIG 1000ML POUR BTL (IV SOLUTION) ×4 IMPLANT
PACK BASIN DAY SURGERY FS (CUSTOM PROCEDURE TRAY) ×4 IMPLANT
PAD FOAM SILICONE BACKED (GAUZE/BANDAGES/DRESSINGS) IMPLANT
PENCIL SMOKE EVACUATOR (MISCELLANEOUS) ×4 IMPLANT
PIN SAFETY STERILE (MISCELLANEOUS) ×4 IMPLANT
RETRACTOR ONETRAX LX 90X20 (MISCELLANEOUS) IMPLANT
SHEET MEDIUM DRAPE 40X70 STRL (DRAPES) ×2 IMPLANT
SLEEVE SCD COMPRESS KNEE MED (STOCKING) ×4 IMPLANT
SPIKE FLUID TRANSFER (MISCELLANEOUS) IMPLANT
SPONGE T-LAP 18X18 ~~LOC~~+RFID (SPONGE) ×10 IMPLANT
SPONGE T-LAP 4X18 ~~LOC~~+RFID (SPONGE) ×1 IMPLANT
STAPLER VISISTAT 35W (STAPLE) ×1 IMPLANT
STRIP CLOSURE SKIN 1/2X4 (GAUZE/BANDAGES/DRESSINGS) IMPLANT
STRIP SUTURE WOUND CLOSURE 1/2 (MISCELLANEOUS) IMPLANT
SUT ETHILON 2 0 FS 18 (SUTURE) ×3 IMPLANT
SUT MNCRL AB 4-0 PS2 18 (SUTURE) ×5 IMPLANT
SUT MON AB 3-0 SH 27 (SUTURE) ×6
SUT MON AB 3-0 SH27 (SUTURE) ×3 IMPLANT
SUT MON AB 5-0 PS2 18 (SUTURE) ×3 IMPLANT
SUT PDS 3-0 CT2 (SUTURE) ×12
SUT PDS AB 2-0 CT2 27 (SUTURE) ×4 IMPLANT
SUT PDS II 3-0 CT2 27 ABS (SUTURE) IMPLANT
SUT SILK 2 0 SH (SUTURE) ×2 IMPLANT
SUT SILK 3 0 PS 1 (SUTURE) ×5 IMPLANT
SUT VIC AB 2-0 SH 27 (SUTURE)
SUT VIC AB 2-0 SH 27XBRD (SUTURE) ×6 IMPLANT
SUT VIC AB 3-0 54X BRD REEL (SUTURE) IMPLANT
SUT VIC AB 3-0 BRD 54 (SUTURE)
SUT VIC AB 3-0 SH 27 (SUTURE)
SUT VIC AB 3-0 SH 27X BRD (SUTURE) IMPLANT
SUT VICRYL 3-0 CR8 SH (SUTURE) ×4 IMPLANT
SYR BULB IRRIG 60ML STRL (SYRINGE) ×4 IMPLANT
SYR CONTROL 10ML LL (SYRINGE) IMPLANT
TOWEL GREEN STERILE FF (TOWEL DISPOSABLE) ×8 IMPLANT
TRACER MAGTRACE VIAL (MISCELLANEOUS) ×1 IMPLANT
TRAY DSU PREP LF (CUSTOM PROCEDURE TRAY) ×3 IMPLANT
TUBE CONNECTING 20X1/4 (TUBING) ×4 IMPLANT
UNDERPAD 30X36 HEAVY ABSORB (UNDERPADS AND DIAPERS) ×8 IMPLANT
YANKAUER SUCT BULB TIP NO VENT (SUCTIONS) ×5 IMPLANT

## 2021-12-06 NOTE — Anesthesia Procedure Notes (Signed)
Procedure Name: LMA Insertion ?Date/Time: 12/06/2021 8:45 AM ?Performed by: Glory Buff, CRNA ?Pre-anesthesia Checklist: Patient identified, Emergency Drugs available, Suction available and Patient being monitored ?Patient Re-evaluated:Patient Re-evaluated prior to induction ?Oxygen Delivery Method: Circle system utilized ?Preoxygenation: Pre-oxygenation with 100% oxygen ?Induction Type: IV induction ?Ventilation: Mask ventilation without difficulty ?LMA: LMA inserted ?LMA Size: 4.0 ?Number of attempts: 1 ?Placement Confirmation: positive ETCO2 ?Tube secured with: Tape ?Dental Injury: Teeth and Oropharynx as per pre-operative assessment  ? ? ? ? ?

## 2021-12-06 NOTE — Op Note (Signed)
Preoperative diagnosis: clinical stage II right breast cancer s/p primary chemotherapy ?Postoperative diagnosis: same as above ?Procedure:  ?Left risk reducing skin sparing mastectomy ?Right skin sparing mastectomy ?Right axillary node seed guided excision ?Right deep axillary sentinel node biopsy ?Injection magtrace for sentinel node identification ?Surgeon: Dr Serita Grammes ?EBL: 150 cc ?Anesthesia: general with bilateral pec blocks ?Drains per plastic surgery ?Specimens: ?Left mastectomy short superior, long lateral ?Right mastectomy short superior, long lateral ?Right axillary seed containing node ?Right axillary sentinel nodes ?Complications none ?Case turned over to plastic surgery ? ?Indications: 36 y.o. female who is seen today for follow up after chemotherapy. ?In [place] was found to have a 3.8 cm right retroareolar mass, there was also one prominent node with a 4 mm cortex and another that is borderline. Biopsy of a node is positive. 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 She has undergone an MRI that shows a right breast mass that measures up to 11.1 cm and encompasses the whole breast. She did well with chemotherapy and repeat mri shows a good response with small masses throughout right breast and normal nodes right axilla genetics was negative ?She did have a left IJ dvt during therapy and is on xarelto. She elected to proceed with bilateral mastectomy (I didn't think nsm was appropriate still) and tad on right. ? ?Procedure: After informed consent was obtained she first underwent bilateral pectoral blocks.  She was given antibiotics.  SCDs were placed.  She was then taken to the operating placed under general anesthesia without complication.  We did a surgical timeout. ?I then prepped the area lateral to her areola on the right.  I injected 2 cc of mag trace in the subareolar position.  She was then prepped and  draped in the standard sterile surgical fashion.  We repeated a timeout. ? ?I first did the left risk reducing mastectomy.  She was not a candidate for nipple sparing mastectomy.  I made an incision just around the nipple and the areola.  I then created flaps to the clavicle, parasternal area, inframammary fold, and the lateral border of the breast.  I did not disturb the port that was in place on the side.  I then remove the breast and the fascia from the pectoralis muscle and passed this off the table as above.  I then obtained hemostasis on this side.  I placed sponges and it moved to the other side. ? ?I did the right mastectomy in a similar fashion.  The site was more vascular and all of the blood loss was from the side.  I then remove the breast and the fascia in a similar fashion and passed this off the table.  I then was able to identify several nodes containing mag trace.  I remove these as a bundle.  The counts were in the 9000's.  The seed containing node which also was positive for mag trace was removed separately.  Mammogram confirmed removal of the seed and the clip.  I then obtained hemostasis.  The site was packed and the case turned over to plastic surgery for reconstruction. ?

## 2021-12-06 NOTE — Transfer of Care (Signed)
Immediate Anesthesia Transfer of Care Note ? ?Patient: Jenna Cross ? ?Procedure(s) Performed: BILATERAL SKIN SPARING TOTAL MASTECTOMY (Bilateral: Breast) ?RADIOACTIVE SEED GUIDED RIGHT AXILLARY SENTINEL LYMPH NODE EXCISION (Right: Breast) ?RIGHT AXILLARY SENTINEL NODE BIOPSY (Right: Breast) ?BREAST RECONSTRUCTION WITH PLACEMENT OF TISSUE EXPANDER AND FLEX HD (ACELLULAR HYDRATED DERMIS) (Bilateral: Breast) ? ?Patient Location: PACU ? ?Anesthesia Type:GA combined with regional for post-op pain ? ?Level of Consciousness: awake, alert  and oriented ? ?Airway & Oxygen Therapy: Patient Spontanous Breathing and Patient connected to face mask oxygen ? ?Post-op Assessment: Report given to RN and Post -op Vital signs reviewed and stable ? ?Post vital signs: Reviewed and stable ? ?Last Vitals:  ?Vitals Value Taken Time  ?BP 124/68 12/06/21 1126  ?Temp    ?Pulse 118 12/06/21 1129  ?Resp 15 12/06/21 1129  ?SpO2 100 % 12/06/21 1129  ?Vitals shown include unvalidated device data. ? ?Last Pain:  ?Vitals:  ? 12/06/21 0700  ?TempSrc: Oral  ?PainSc: 0-No pain  ?   ? ?Patients Stated Pain Goal: 3 (12/06/21 0700) ? ?Complications: No notable events documented. ?

## 2021-12-06 NOTE — H&P (Signed)
?36 y.o. female who is seen today for follow up after chemotherapy. ? ?9/29 visit "Otherwise healthy female (here with her dad Dr Breyona Swander Saras) was seen for possible lift by plastic surgery and was thought to have a mass. She has no dc. She then went for a mm/us. She was found to have a 3.8 cm right retroareolar mass, there was also one prominent node with a 4 mm cortex and another that is borderline. Biopsy of a node is positive. 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 She has undergone an MRI that shows a right breast mass that measures up to 11.1 cm and encompasses the whole breast. The other breast and nodes are fine, there are a couple right ax nodes. She has MR of brain due to HA that is negative." ?Her bone scan is negative. CT c/a/p shows numerous enhancing masses, right ax adenopathy and some nonspecific pulm nodules. She has now completed tchp. Did dignicap and has kept her hair. 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  ? ? ? ?Review of Systems: ?A complete review of systems was obtained from the patient. I have reviewed this information and discussed as appropriate with the patient. See HPI as well for other ROS. ? ?Review of Systems  ?All other systems reviewed and are negative. ? ? ?Medical History: ?Past Medical History:  ?Diagnosis Date  ? Cancer of overlapping sites of right female breast (CMS-HCC)  ? ?Patient Active Problem List  ?Diagnosis  ? Malignant neoplasm of upper-outer quadrant of right breast in female, estrogen receptor positive (CMS-HCC)  ? ?Past Surgical History:  ?Procedure Laterality Date  ? BREAST EXCISIONAL BIOPSY  ?FAs  ? ? ?No Known Allergies ? ?Current Outpatient Medications on File Prior to Visit  ?Medication Sig Dispense Refill  ?  ALPRAZolam (XANAX) 0.5 MG tablet alprazolam 0.5 mg tablet ?TAKE 1 TABLET BY MOUTH EVERY 6 HOURS AS NEEDED FOR ANXIETY  ? escitalopram oxalate (LEXAPRO) 10 MG tablet escitalopram 10 mg tablet ?TAKE 1 TABLET BY MOUTH EVERY DAY  ? valACYclovir (VALTREX) 500 MG tablet valacyclovir 500 mg tablet ?TAKE 1 TABLET BY MOUTH EVERY DAY  ? XARELTO 20 mg tablet TAKE 1 TABLET BY MOUTH DAILY WITH SUPPER. ? ?Family History  ?Problem Relation Age of Onset  ? Prostate cancer Maternal Grandfather  ? ? ?Social History  ? ?Tobacco Use  ?Smoking Status Never  ?Smokeless Tobacco Never  ? ? ?Social History  ? ?Socioeconomic History  ? Marital status: Married  ?Tobacco Use  ? Smoking status: Never  ? Smokeless tobacco: Never  ?Substance and Sexual Activity  ? Alcohol use: Not Currently  ? Drug use: Not Currently  ? ?Objective ? ?Physical Exam ?Constitutional:  ?Appearance: Normal appearance.  ?Chest:  ?Breasts: ?Right: No inverted nipple, mass or nipple discharge.  ?Left: No inverted nipple, mass or nipple discharge.  ?Lymphadenopathy:  ?Upper Body:  ?Right upper body: No supraclavicular or axillary adenopathy.  ?Left upper body: No supraclavicular or axillary adenopathy.  ?Neurological:  ?Mental Status: She is alert.  ? ? ?Assessment and Plan ? ?Malignant neoplasm of upper-outer quadrant of right breast in female, estrogen receptor positive (CMS-HCC) ? ?Right SSM, Left risk reducing SSM, right ax sn biopsy,  magtrace injection right, right ax node seed guided excision ? ?I am encouraged about her response. It does appear to be very good. I would not be surprised if this is not all completely gone given her tumor though. ? ?I discussed with her that I still think she needs a mastectomy on the right side. I do not think a nipple sparing mastectomy is the best oncological solution to the side. We discussed a skin sparing mastectomy. We discussed a risk reducing skin sparing mastectomy on the other side. ? ?I also discussed an axillary sentinel  lymph node biopsy as well as a seed guided excision of the prior positive node to do a targeted node dissection. We discussed that rarely are we able to target the node that of her previously biopsied and if that is the case we would proceed with a sentinel lymph node biopsy alone. We discussed the performance of that as well as the risks including lymphedema. ? ?Her oncologist has a plan for her anticoagulation and she is going to be bridged with Lovenox at the time of surgery. ? ?Plan is for prepectoral expanders at the time of surgery and I will ask plastic surgery if a direct implant would not be at all an option because she is very interested and getting her final implants prior to beginning radiation but I do not want to delay her radiation. ?

## 2021-12-06 NOTE — Op Note (Signed)
Op report   ? ?DATE OF OPERATION:  12/06/2021 ? ?LOCATION: [facility] ? ?SURGICAL DIVISION: Plastic Surgery ? ?PREOPERATIVE DIAGNOSES:  ?1. Breast cancer.   ? ?POSTOPERATIVE DIAGNOSES:  ?1. Breast cancer.  ? ?PROCEDURE:  ?1. Bilateral immediate breast reconstruction with placement of Acellular Dermal Matrix and tissue expanders. ? ?SURGEON: Jenna Hetz Sanger Cortina Vultaggio, DO ? ?ASSISTANT:  Dr. Lennice Sites and Roetta Sessions, PA ? ?ANESTHESIA:  General.  ? ?COMPLICATIONS: None.  ? ?IMPLANTS: ?Left - Mentor 455 cc. Ref #[id number].  Serial Number [id number], 250 cc of injectable saline placed in the expander. ?Right - Mentor 455 cc. Ref #[id number].  Serial Number [id number], 250 cc of injectable saline placed in the expander. ?Acellular Dermal Matrix: Flex HD 19 x 22 cm x 2 ? ?INDICATIONS FOR PROCEDURE:  ?The patient, Jenna Cross, is a 36 y.o. female born on October 25, 1985, is here for  immediate first stage breast reconstruction with placement of bilateral tissue expander and Acellular dermal matrix. ?MRN: [id number] ? ?CONSENT:  ?Informed consent was obtained directly from the patient. Risks, benefits and alternatives were fully discussed. Specific risks including but not limited to bleeding, infection, hematoma, seroma, scarring, pain, implant infection, implant extrusion, capsular contracture, asymmetry, wound healing problems, and need for further surgery were all discussed. The patient did have an ample opportunity to have her questions answered to her satisfaction.  ? ?DESCRIPTION OF PROCEDURE:  ?The patient was taken to the operating room by the general surgery team. SCDs were placed and IV antibiotics were given. The patient's chest was prepped and draped in a sterile fashion. A time out was performed and the implants to be used were identified.  Bilateral mastectomies were performed.  Once the general surgery team had completed their portion of the case the patient was rendered to the  plastic and reconstructive surgery team. ? ?Left:   The pocket was irrigated with antibiotic solution and hemostasis was achieved with electrocautery.  The ADM was then prepared according to the manufacture guidelines.  The ADM was sutured like a purse string around the expander and then the 2-0 PDS was secured to the tab.  The medial and lateral tabs were used to secure the expander to the chest wall with 3-0 PDS.  The drain was placed at the inframammary fold and secured to the skin with 3-0 Silk. The deep layers were closed with 3-0 PDS and 3-0 Monocryl.  The skin was closed with 4-0 Monocryl and then dermabond was applied.   ? ?Right:  The pocket was irrigated with antibiotic solution and hemostasis was achieved with electrocautery.  The ADM was then prepared according to the manufacture guidelines.  The ADM was sutured like a purse string around the expander and then the 2-0 PDS was secured to the tab.  The medial and lateral tabs were used to secure the expander to the chest wall with 3-0 PDS.  The drain was placed at the inframammary fold and secured to the skin with 3-0 Silk. The deep layers were closed with 3-0 PDS and 3-0 Monocryl.  The skin was closed with 4-0 Monocryl and then dermabond was applied.  Derma bond was applied.  The ABDs and breast binder were placed.  The patient tolerated the procedure well and there were no complications.  The patient was allowed to wake from anesthesia and taken to the recovery room in satisfactory condition.  ? ?Dr. Erin Hearing assisted throughout the case. Dr. Erin Hearing was essential in retraction and counter traction when needed  to make the case progress smoothly.  This retraction and assistance made it possible to see the tissue plans for the procedure.  The assistance was needed for blood control, tissue re-approximation and assisted with closure of the incision site. ? ? ?

## 2021-12-06 NOTE — Progress Notes (Signed)
Assisted Dr. Royce Macadamia with right, left, ultrasound guided, pectoralis block. Side rails up, monitors on throughout procedure. See vital signs in flow sheet. Tolerated Procedure well. ?

## 2021-12-06 NOTE — Discharge Instructions (Signed)
INSTRUCTIONS FOR AFTER BREAST SURGERY   You will likely have some questions about what to expect following your operation.  The following information will help you and your family understand what to expect when you are discharged from the hospital.  Following these guidelines will help ensure a smooth recovery and reduce risks of complications.  Postoperative instructions include information on: diet, wound care, medications and physical activity.  AFTER SURGERY Expect to go home after the procedure.  In some cases, you may need to spend one night in the hospital for observation.  DIET Breast surgery does not require a specific diet.  However, the healthier you eat the better your body can start healing. It is important to increasing your protein intake.  This means limiting the foods with sugar and carbohydrates.  Focus on vegetables and some meat.  If you have any liposuction during your procedure be sure to drink water.  If your urine is bright yellow, then it is concentrated, and you need to drink more water.  As a general rule after surgery, you should have 8 ounces of water every hour while awake.  If you find you are persistently nauseated or unable to take in liquids let us know.  NO TOBACCO USE or EXPOSURE.  This will slow your healing process and increase the risk of a wound.  WOUND CARE Leave the ACE wrap or binder on for 3 days . Use fragrance free soap.   After 3 days you can remove the ACE wrap or binder to shower. Once dry apply ACE wrap, binder or sports bra.  Use a mild soap like Dial, Dove and Ivory. You may have Topifoam or Lipofoam on.  It is soft and spongy and helps keep you from getting creases if you have liposuction.  This can be removed before the shower and then replaced.  If you need more it is available on [employer] (Lipofoam). If you have steri-strips / tape directly attached to your skin leave them in place. It is OK to get these wet.   No baths, pools or hot tubs for four  weeks. We close your incision to leave the smallest and best-looking scar. No ointment or creams on your incisions until given the go ahead.  Especially not Neosporin (Too many skin reactions with this one).  A few weeks after surgery you can use Mederma and start massaging the scar. We ask you to wear your binder or sports bra for the first 6 weeks around the clock, including while sleeping. This provides added comfort and helps reduce the fluid accumulation at the surgery site.  ACTIVITY No heavy lifting until cleared by the doctor.  This usually means no more than a half-gallon of milk.  It is OK to walk and climb stairs. In fact, moving your legs is very important to decrease your risk of a blood clot.  It will also help keep you from getting deconditioned.  Every 1 to 2 hours get up and walk for 5 minutes. This will help with a quicker recovery back to normal.  Let pain be your guide so you don't do too much.  This is not the time for spring cleaning and don't plan on taking care of anyone else.  This time is for you to recover,  You will be more comfortable if you sleep and rest with your head elevated either with a few pillows under you or in a recliner.  No stomach sleeping for a three months.  WORK Everyone   returns to work at different times. As a rough guide, most people take at least 1 - 2 weeks off prior to returning to work. If you need documentation for your job, bring the forms to your postoperative follow up visit. ? ?DRIVING ?Arrange for someone to bring you home from the hospital.  You may be able to drive a few days after surgery but not while taking any narcotics or valium. ? ?BOWEL MOVEMENTS ?Constipation can occur after anesthesia and while taking pain medication.  It is important to stay ahead for your comfort.  We recommend taking Milk of Magnesia (2 tablespoons; twice a day) while taking the pain pills. ? ?MEDICATIONS You may be prescribed should start after surgery ?At your  preoperative visit for you history and physical you may have been given the following medications: ?An antibiotic: Start this medication when you get home and take according to the instructions on the bottle. ?Zofran 4 mg:  This is to treat nausea and vomiting.  You can take this every 6 hours as needed and only if needed. ?Valium 2 mg: This is for muscle tightness if you have an implant or expander. This will help relax your muscle which also helps with pain control.  This can be taken every 12 hours as needed. Don't drive after taking this medication. ?Norco (hydrocodone/acetaminophen) 5/325 mg:  This is only to be used after you have taken the motrin or the tylenol. Every 8 hours as needed. ? ? ?Over the counter Medication to take: ?Ibuprofen (Motrin) 600 mg:  Take this every 6 hours.  If you have additional pain then take 500 mg of the tylenol every 8 hours.  Only take the Norco after you have tried these two. ?Miralax or stool softener of choice: Take this according to the bottle if you take the Norco. ? ?WHEN TO CALL ?Call your surgeon's office if any of the following occur: ?Fever 101 degrees F or greater ?Excessive bleeding or fluid from the incision site. ?Pain that increases over time without aid from the medications ?Redness, warmth, or pus draining from incision sites ?Persistent nausea or inability to take in liquids ?Severe misshapen area that underwent the operation. ? ?Here are some resources: ? ?Plastic surgery website: https://www.plasticsurgery.org/for-medical-professionals/education-and-resources/publications/breast-reconstruction-magazine ?Breast Reconstruction Awareness Campaign:  [employer]co[employer]nz ?Plastic surgery Implant information:  https://www.plasticsurgery.org/patient-safety/breast-implant-safety ? ? ?

## 2021-12-06 NOTE — Anesthesia Postprocedure Evaluation (Signed)
Anesthesia Post Note ? ?Patient: Jenna Cross ? ?Procedure(s) Performed: BILATERAL SKIN SPARING TOTAL MASTECTOMY (Bilateral: Breast) ?RADIOACTIVE SEED GUIDED RIGHT AXILLARY SENTINEL LYMPH NODE EXCISION (Right: Breast) ?RIGHT AXILLARY SENTINEL NODE BIOPSY (Right: Breast) ?BREAST RECONSTRUCTION WITH PLACEMENT OF TISSUE EXPANDER AND FLEX HD (ACELLULAR HYDRATED DERMIS) (Bilateral: Breast) ? ?  ? ?Patient location during evaluation: PACU ?Anesthesia Type: General ?Level of consciousness: awake and alert and oriented ?Pain management: pain level controlled ?Vital Signs Assessment: post-procedure vital signs reviewed and stable ?Respiratory status: spontaneous breathing, nonlabored ventilation and respiratory function stable ?Cardiovascular status: blood pressure returned to baseline and stable ?Postop Assessment: no apparent nausea or vomiting ?Anesthetic complications: no ? ? ?No notable events documented. ? ?Last Vitals:  ?Vitals:  ? 12/06/21 1145 12/06/21 1200  ?BP: 120/71 110/68  ?Pulse: (!) 111 (!) 121  ?Resp: 12 (!) 21  ?Temp:    ?SpO2: 97% 99%  ?  ?Last Pain:  ?Vitals:  ? 12/06/21 1200  ?TempSrc:   ?PainSc: 3   ? ? ?  ?  ?  ?  ?  ?  ? ?Shevon Sian A. ? ? ? ? ?

## 2021-12-06 NOTE — Anesthesia Procedure Notes (Signed)
Anesthesia Regional Block: Pectoralis block  ? ?Pre-Anesthetic Checklist: , timeout performed,  Correct Patient, Correct Site, Correct Laterality,  Correct Procedure, Correct Position, site marked,  Risks and benefits discussed,  Surgical consent,  Pre-op evaluation,  At surgeon's request and post-op pain management ? ?Laterality: Right ? ?Prep: chloraprep     ?  ?Needles:  ?Injection technique: Single-shot ? ?Needle Type: Echogenic Stimulator Needle   ? ? ?Needle Length: 9cm  ?Needle Gauge: 21  ? ?Needle insertion depth: 7 cm ? ? ?Additional Needles: ? ? ?Procedures:,,,, ultrasound used (permanent image in chart),,    ?Narrative:  ?Start time: 12/06/2021 8:10 AM ?End time: 12/06/2021 8:15 AM ?Injection made incrementally with aspirations every 5 mL. ? ?Performed by: Personally  ?Anesthesiologist: Josephine Igo, MD ? ?Additional Notes: ?Timeout performed. Patient sedated. Relevant anatomy ID'd using [place]. Incremental 2-53ml injection of LA with frequent aspiration. Patient tolerated procedure well. ? ? ? ?Right Pectoralis Block ? ?

## 2021-12-06 NOTE — Anesthesia Procedure Notes (Signed)
Anesthesia Regional Block: Pectoralis block  ? ?Pre-Anesthetic Checklist: , timeout performed,  Correct Patient, Correct Site, Correct Laterality,  Correct Procedure, Correct Position, site marked,  Risks and benefits discussed,  Surgical consent,  Pre-op evaluation,  At surgeon's request and post-op pain management ? ?Laterality: Left ? ?Prep: chloraprep     ?  ?Needles:  ?Injection technique: Single-shot ? ?Needle Type: Echogenic Stimulator Needle   ? ? ?Needle Length: 9cm  ?Needle Gauge: 21  ? ?Needle insertion depth: 6 cm ? ? ?Additional Needles: ? ? ?Procedures:,,,, ultrasound used (permanent image in chart),,    ?Narrative:  ?Start time: 12/06/2021 8:15 AM ?End time: 12/06/2021 8:20 AM ?Injection made incrementally with aspirations every 5 mL. ? ?Performed by: Personally  ?Anesthesiologist: Josephine Igo, MD ? ?Additional Notes: ?Timeout performed. Patient sedated. Relevant anatomy ID'd using [place]. Incremental 2-39ml injection of LA with frequent aspiration. Patient tolerated procedure well. ? ? ? ?Left Pectoralis Block ? ?

## 2021-12-06 NOTE — Interval H&P Note (Signed)
History and Physical Interval Note: ? ?12/06/2021 ?7:14 AM ? ?Jenna Cross  has presented today for surgery, with the diagnosis of BREAST CANCER.  The various methods of treatment have been discussed with the patient and family. After consideration of risks, benefits and other options for treatment, the patient has consented to  Procedure(s): ?BILATERAL SKIN SPARING TOTAL MASTECTOMY (Bilateral) ?RADIOACTIVE SEED GUIDED RIGHT AXILLARY SENTINEL LYMPH NODE EXCISION (Right) ?RIGHT AXILLARY SENTINEL NODE BIOPSY (Right) ?BREAST RECONSTRUCTION WITH PLACEMENT OF TISSUE EXPANDER AND FLEX HD (ACELLULAR HYDRATED DERMIS) (Bilateral) as a surgical intervention.  The patient's history has been reviewed, patient examined, no change in status, stable for surgery.  I have reviewed the patient's chart and labs.  Questions were answered to the patient's satisfaction.   ? ? ?Rolm Bookbinder ? ? ?

## 2021-12-06 NOTE — Interval H&P Note (Signed)
History and Physical Interval Note: ? ?12/06/2021 ?7:20 AM ? ?Jenna Cross  has presented today for surgery, with the diagnosis of BREAST CANCER.  The various methods of treatment have been discussed with the patient and family. After consideration of risks, benefits and other options for treatment, the patient has consented to  Procedure(s): ?BILATERAL SKIN SPARING TOTAL MASTECTOMY (Bilateral) ?RADIOACTIVE SEED GUIDED RIGHT AXILLARY SENTINEL LYMPH NODE EXCISION (Right) ?RIGHT AXILLARY SENTINEL NODE BIOPSY (Right) ?BREAST RECONSTRUCTION WITH PLACEMENT OF TISSUE EXPANDER AND FLEX HD (ACELLULAR HYDRATED DERMIS) (Bilateral) as a surgical intervention.  The patient's history has been reviewed, patient examined, no change in status, stable for surgery.  I have reviewed the patient's chart and labs.  Questions were answered to the patient's satisfaction.   ? ? ?[name] Masson Nalepa ? ? ?

## 2021-12-07 ENCOUNTER — Encounter (HOSPITAL_BASED_OUTPATIENT_CLINIC_OR_DEPARTMENT_OTHER): Payer: Self-pay | Admitting: General Surgery

## 2021-12-07 DIAGNOSIS — C50411 Malignant neoplasm of upper-outer quadrant of right female breast: Secondary | ICD-10-CM | POA: Diagnosis not present

## 2021-12-07 MED ORDER — METHOCARBAMOL 500 MG PO TABS
500.0000 mg | ORAL_TABLET | Freq: Three times a day (TID) | ORAL | 0 refills | Status: AC | PRN
Start: 2021-12-07 — End: 2021-12-09

## 2021-12-07 MED ORDER — CEFAZOLIN SODIUM-DEXTROSE 2-4 GM/100ML-% IV SOLN
INTRAVENOUS | Status: AC
  Filled 2021-12-07: qty 100

## 2021-12-07 NOTE — Discharge Summary (Signed)
Physician Discharge Summary  ?Patient ID: ?Jenna Cross ?MRN: [id number] ?DOB/AGE: 10-26-1985 36 y.o. ? ?Admit date: 12/06/2021 ?Discharge date: 12/07/2021 ? ?Admission Diagnoses: ? ?Discharge Diagnoses:  ?Principal Problem: ?  Breast cancer ([place]) ? ? ?Discharged Condition: good ? ?Hospital Course: 36 year old female presented to the [facility] day surgery operating room on 12/06/2021 for bilateral mastectomy by Dr. Donne Hazel followed by immediate bilateral breast reconstruction and placement of tissue expanders by Dr. Marla Roe.  Patient stayed overnight for observation and pain control.  She is doing well this a.m., reports no issues this AM.  Feels well.  Would like to go home.  No acute overnight events. ? ?Consults: None ? ?Significant Diagnostic Studies: None ? ?Treatments: IV hydration, antibiotics: Ancef, and analgesia: acetaminophen, Vicodin, and Robaxin and Valium ? ?Discharge Exam: ?Blood pressure (!) 107/46, pulse 85, temperature 98.5 ?F (36.9 ?C), resp. rate 16, height 5\' 4"  (1.626 m), weight 60 kg, last menstrual period 06/27/2021, SpO2 100 %, not currently breastfeeding. ?General appearance: alert, cooperative, no distress, and RN at bedside ?Head: Normocephalic, without obvious abnormality, atraumatic ?Resp: Unlabored ?Breasts: Bilateral mastectomy flaps with good color and capillary refill.  No skin necrosis or skin viability concerns noted.  No subcutaneous fluid collections.  Bilateral JP drains with serosanguineous output noted.  No ecchymosis noted.  No erythema noted.  Dressings intact. ?Extremities: extremities normal, atraumatic, no cyanosis or edema and SCDs in place ?Incision/Wound: ?Incision intact, dressings in place over bilateral breast. ? ?Disposition: Discharge disposition: 01-Home or Self Care ? ? ? ? ? ? ?Discharge Instructions   ? ? Call MD for:  difficulty breathing, headache or visual disturbances   Complete by: As directed ?  ? Call MD for:  extreme fatigue   Complete by: As  directed ?  ? Call MD for:  hives   Complete by: As directed ?  ? Call MD for:  persistant dizziness or light-headedness   Complete by: As directed ?  ? Call MD for:  persistant nausea and vomiting   Complete by: As directed ?  ? Call MD for:  redness, tenderness, or signs of infection (pain, swelling, redness, odor or green/yellow discharge around incision site)   Complete by: As directed ?  ? Call MD for:  severe uncontrolled pain   Complete by: As directed ?  ? Call MD for:  temperature >100.4   Complete by: As directed ?  ? Diet - low sodium heart healthy   Complete by: As directed ?  ? Increase activity slowly   Complete by: As directed ?  ? ?  ? ?Allergies as of 12/07/2021   ?No Known Allergies ?  ? ?  ?Medication List  ?  ? ?STOP taking these medications   ? ?cephALEXin 500 MG capsule ?Commonly known as: KEFLEX ?  ?oxyCODONE 5 MG immediate release tablet ?Commonly known as: Oxy IR/ROXICODONE ?  ? ?  ? ?TAKE these medications   ? ?acetaminophen 500 MG tablet ?Commonly known as: TYLENOL ?Take 1,000 mg by mouth every 6 (six) hours as needed for mild pain or headache. ?  ?dexamethasone 4 MG tablet ?Commonly known as: DECADRON ?TAKE 1 TABLET DAILY. TAKE 1 TABLET DAY BEFORE CHEMO AND 1 TABLET DAY AFTER CHEMO WITH FOOD ?  ?diphenoxylate-atropine 2.5-0.025 MG tablet ?Commonly known as: LOMOTIL ?Take 1 tablet by mouth 3 (three) times daily as needed for diarrhea or loose stools. ?  ?enoxaparin 60 MG/0.6ML injection ?Commonly known as: Lovenox ?Inject 0.6 mLs (60 mg total) into the  skin every 12 (twelve) hours. ?  ?escitalopram 20 MG tablet ?Commonly known as: LEXAPRO ?Take 20 mg by mouth daily. ?  ?ibuprofen 600 MG tablet ?Commonly known as: ADVIL ?Take 1 tablet (600 mg total) by mouth every 6 (six) hours as needed. ?  ?lidocaine-prilocaine cream ?Commonly known as: EMLA ?Apply to affected area once ?  ?loratadine 10 MG tablet ?Commonly known as: CLARITIN ?Take 10 mg by mouth daily. ?  ?LORazepam 0.5 MG tablet ?Commonly  known as: ATIVAN ?TAKE 1 TABLET (0.5 MG TOTAL) BY MOUTH AT BEDTIME AS NEEDED (NAUSEA OR VOMITING). ?  ?magic mouthwash w/lidocaine Soln ?Take 5 mLs by mouth 3 (three) times daily as needed for mouth pain. ?  ?methocarbamol 500 MG tablet ?Commonly known as: Robaxin ?Take 1 tablet (500 mg total) by mouth every 8 (eight) hours as needed for up to 2 days for muscle spasms. ?  ?ondansetron 8 MG tablet ?Commonly known as: ZOFRAN ?TAKE 1 TABLET 2 TIMES DAILY AS NEEDED (NAUSEA OR VOMITING). START ON THE THIRD DAY AFTER CHEMO ?  ?prochlorperazine 10 MG tablet ?Commonly known as: COMPAZINE ?Take 1 tablet (10 mg total) by mouth every 6 (six) hours as needed (Nausea or vomiting). ?  ?rivaroxaban 20 MG Tabs tablet ?Commonly known as: XARELTO ?Take 1 tablet (20 mg total) by mouth daily with supper. ?  ?valACYclovir 500 MG tablet ?Commonly known as: VALTREX ?Take 500 mg by mouth 2 (two) times daily. ?  ? ?  ? ? Follow-up Information   ? ? Dillingham, [name], DO Follow up in 10 day(s).   ?Specialty: Plastic Surgery ?Contact information: ?[address] ?[place] [address] ?[phone] ? ? ?  ?  ? ? Rolm Bookbinder, MD Follow up in 3 week(s).   ?Specialty: General Surgery ?Contact information: ?[address] ?[address] ?[place] [address] ?[phone] ? ? ?  ?  ? ?  ?  ? ?  ? ? ?[facility] ?[place], [place] [address] ?[phone] ? ?Signed: ?Carola Rhine Sophiamarie Nease ?12/07/2021, 2:02 PM ? ? ?

## 2021-12-08 ENCOUNTER — Telehealth: Payer: Self-pay | Admitting: *Deleted

## 2021-12-08 NOTE — Telephone Encounter (Signed)
Requested 2nd look at patient's lymph nodes that were removed from her surgical specimen from pathology. ?

## 2021-12-11 LAB — SURGICAL PATHOLOGY

## 2021-12-12 ENCOUNTER — Encounter: Payer: Self-pay | Admitting: Plastic Surgery

## 2021-12-14 ENCOUNTER — Telehealth: Payer: Self-pay | Admitting: Hematology and Oncology

## 2021-12-14 ENCOUNTER — Encounter: Payer: Self-pay | Admitting: *Deleted

## 2021-12-14 DIAGNOSIS — Z170 Estrogen receptor positive status [ER+]: Secondary | ICD-10-CM

## 2021-12-14 NOTE — Telephone Encounter (Signed)
.  Called patient to schedule appointment per 3/9 inbasket, patient is aware of date and time.   ?

## 2021-12-15 ENCOUNTER — Encounter: Payer: Self-pay | Admitting: Plastic Surgery

## 2021-12-15 ENCOUNTER — Other Ambulatory Visit: Payer: Self-pay

## 2021-12-15 ENCOUNTER — Ambulatory Visit (INDEPENDENT_AMBULATORY_CARE_PROVIDER_SITE_OTHER): Payer: 59 | Admitting: Plastic Surgery

## 2021-12-15 DIAGNOSIS — Z170 Estrogen receptor positive status [ER+]: Secondary | ICD-10-CM

## 2021-12-15 DIAGNOSIS — C50411 Malignant neoplasm of upper-outer quadrant of right female breast: Secondary | ICD-10-CM

## 2021-12-15 NOTE — Progress Notes (Signed)
? ?  Subjective:  ? ? Patient ID: Jenna Cross, female    DOB: 1986-05-17, 36 y.o.   MRN: [id number] ? ?The patient is a 36 year old female here with her husband for follow-up on her breast reconstruction.  She had bilateral mastectomies on March 1.  She had 250 cc put in each expander.  She is actually pretty happy with the size.  She would like to try and get these exchanged to an implant if possible prior to radiation.  The drains are in with minimal output.  We will plan to take those out next week.  No sign of a hematoma or seroma. ? ? ? ?Review of Systems  ?Constitutional: Negative.   ?Eyes: Negative.   ?Respiratory: Negative.    ?Cardiovascular: Negative.   ?Gastrointestinal: Negative.   ?Endocrine: Negative.   ?Genitourinary: Negative.   ? ?   ?Objective:  ? Physical Exam ?Constitutional:   ?   Appearance: Normal appearance.  ?Cardiovascular:  ?   Rate and Rhythm: Normal rate.  ?   Pulses: Normal pulses.  ?Pulmonary:  ?   Effort: Pulmonary effort is normal.  ?Skin: ?   Capillary Refill: Capillary refill takes less than 2 seconds.  ?Neurological:  ?   Mental Status: She is alert and oriented to person, place, and time.  ?Psychiatric:     ?   Mood and Affect: Mood normal.     ?   Behavior: Behavior normal.     ?   Thought Content: Thought content normal.  ? ? ?   ?Assessment & Plan:  ? ?  ICD-10-CM   ?1. Malignant neoplasm of upper-outer quadrant of right breast in female, estrogen receptor positive ([place])  C50.411   ? Z17.0   ?  ?  ?We placed injectable saline in the Expander using a sterile technique: ?Right: 50 cc for a total of 300 / 455 cc ?Left: 50 cc for a total of 300 / 455 cc ? ?We will go ahead and pick a surgery date as the patient would like to have the exchange prior to radiation.  I think with her expansion we should be able to do that.  She will come next week for 1 more expansion. ? ? ?

## 2021-12-15 NOTE — Progress Notes (Incomplete)
Patient Care Team: Dian Queen, MD as PCP - General (Obstetrics and Gynecology) Dian Queen, MD (Obstetrics and Gynecology) Mauro Kaufmann, RN as Oncology Nurse Navigator Rockwell Germany, RN as Oncology Nurse Navigator Nicholas Lose, MD as Consulting Physician (Hematology and Oncology) Gery Pray, MD as Consulting Physician (Radiation Oncology) Rolm Bookbinder, MD as Consulting Physician (General Surgery)  DIAGNOSIS: No diagnosis found.  SUMMARY OF ONCOLOGIC HISTORY: Oncology History  Malignant neoplasm of upper-outer quadrant of right breast in female, estrogen receptor positive ([place])  06/23/2021 Initial Diagnosis   Palpable lump in the right breast. Diagnostic mammogram and [place]: a 3.8 cm mass at the retroareolar right breast, 1 prominent right axillary lymph node with a 4 mm cortex, 1 other which is borderline measuring 3 mm. 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  3 o'clock position: Biopsy fibroadenoma   07/12/2021 -  Neo-Adjuvant Chemotherapy   Neoadjuvant chemotherapy with TCH Perjeta 6 cycles followed by Herceptin Perjeta maintenance versus Kadcyla maintenance (based on response to neoadjuvant chemo) for 1 year   07/28/2021 Genetic Testing   Negative hereditary cancer genetic testing: no pathogenic variants detected in [facility] CancerNext-Expanded +RNAinsight Panel.  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  The report date is July 28, 2021.   The CancerNext-Expanded gene panel offered by [facility] and includes sequencing, rearrangement, and RNA analysis for the following 77 genes: AIP, ALK, APC, ATM, AXIN2, BAP1, BARD1, BLM, BMPR1A, BRCA1, BRCA2, BRIP1, CDC73, CDH1, CDK4, CDKN1B, CDKN2A, CHEK2, CTNNA1, DICER1, FANCC, FH, FLCN, GALNT12, KIF1B, LZTR1, MAX, MEN1, MET, MLH1, MSH2, MSH3, MSH6, MUTYH, NBN, NF1, NF2, NTHL1, PALB2, PHOX2B, PMS2, POT1,  PRKAR1A, PTCH1, PTEN, RAD51C, RAD51D, RB1, RECQL, RET, SDHA, SDHAF2, SDHB, SDHC, SDHD, SMAD4, SMARCA4, SMARCB1, SMARCE1, STK11, SUFU, TMEM127, TP53, TSC1, TSC2, VHL and XRCC2 (sequencing and deletion/duplication); EGFR, EGLN1, HOXB13, KIT, MITF, PDGFRA, POLD1, and POLE (sequencing only); EPCAM and GREM1 (deletion/duplication only).     Surgery   Bilateral Mastectomies with sentinel lymph node biopsy planned following chemotherapy    Radiation Therapy   Following surgery    Anti-estrogen oral therapy   To follow radiation     CHIEF COMPLIANT:Follow-up of right breast cancer   INTERVAL HISTORY: Jenna Cross is a 36 y.o. with above-mentioned history of right breast cancer, currently on chemotherapy with TCHP. She presents to the clinic today for follow-up.   ALLERGIES:  has No Known Allergies.  MEDICATIONS:  Current Outpatient Medications  Medication Sig Dispense Refill   acetaminophen (TYLENOL) 500 MG tablet Take 1,000 mg by mouth every 6 (six) hours as needed for mild pain or headache.     dexamethasone (DECADRON) 4 MG tablet TAKE 1 TABLET DAILY. TAKE 1 TABLET DAY BEFORE CHEMO AND 1 TABLET DAY AFTER CHEMO WITH FOOD 12 tablet 0   diphenoxylate-atropine (LOMOTIL) 2.5-0.025 MG tablet Take 1 tablet by mouth 3 (three) times daily as needed for diarrhea or loose stools. 30 tablet 3   enoxaparin (LOVENOX) 60 MG/0.6ML injection Inject 0.6 mLs (60 mg total) into the skin every 12 (twelve) hours. (Patient taking differently: Inject 60 mg into the skin every 12 (twelve) hours.) 6 mL 0   escitalopram (LEXAPRO) 20 MG tablet Take 20 mg by mouth daily.     ibuprofen (ADVIL) 600 MG tablet Take 1 tablet (600 mg total) by mouth every 6 (six) hours as needed. 30 tablet 0   lidocaine-prilocaine (EMLA) cream Apply to affected area once 30 g 3  loratadine (CLARITIN) 10 MG tablet Take 10 mg by mouth daily.     LORazepam (ATIVAN) 0.5 MG tablet TAKE 1 TABLET (0.5 MG TOTAL) BY MOUTH AT BEDTIME AS NEEDED  (NAUSEA OR VOMITING). 30 tablet 0   magic mouthwash w/lidocaine SOLN Take 5 mLs by mouth 3 (three) times daily as needed for mouth pain. 100 mL 0   ondansetron (ZOFRAN) 8 MG tablet TAKE 1 TABLET 2 TIMES DAILY AS NEEDED (NAUSEA OR VOMITING). START ON THE THIRD DAY AFTER CHEMO 30 tablet 1   prochlorperazine (COMPAZINE) 10 MG tablet Take 1 tablet (10 mg total) by mouth every 6 (six) hours as needed (Nausea or vomiting). 30 tablet 1   rivaroxaban (XARELTO) 20 MG TABS tablet Take 1 tablet (20 mg total) by mouth daily with supper. 30 tablet 5   valACYclovir (VALTREX) 500 MG tablet Take 500 mg by mouth 2 (two) times daily.     No current facility-administered medications for this visit.    PHYSICAL EXAMINATION: ECOG PERFORMANCE STATUS: {CHL ONC ECOG [phone]}  There were no vitals filed for this visit. There were no vitals filed for this visit.  BREAST:*** No palpable masses or nodules in either right or left breasts. No palpable axillary supraclavicular or infraclavicular adenopathy no breast tenderness or nipple discharge. (exam performed in the presence of a chaperone)  LABORATORY DATA:  I have reviewed the data as listed CMP Latest Ref Rng & Units 11/16/2021 10/26/2021 10/05/2021  Glucose 70 - 99 mg/dL 89 90 122(H)  BUN 6 - 20 mg/dL 10 13 8   Creatinine 0.44 - 1.00 mg/dL 0.63 0.57 0.58  Sodium 135 - 145 mmol/L 141 140 140  Potassium 3.5 - 5.1 mmol/L 3.8 3.4(L) 4.0  Chloride 98 - 111 mmol/L 110 108 108  CO2 22 - 32 mmol/L 27 26 25   Calcium 8.9 - 10.3 mg/dL 8.6(L) 9.5 9.0  Total Protein 6.5 - 8.1 g/dL 6.1(L) 6.5 6.9  Total Bilirubin 0.3 - 1.2 mg/dL 0.4 0.4 0.3  Alkaline Phos 38 - 126 U/L 92 96 95  AST 15 - 41 U/L 16 12(L) 11(L)  ALT 0 - 44 U/L 16 12 10     Lab Results  Component Value Date   WBC 3.2 (L) 11/16/2021   HGB 9.7 (L) 11/16/2021   HCT 28.8 (L) 11/16/2021   MCV 105.1 (H) 11/16/2021   PLT 134 (L) 11/16/2021   NEUTROABS 1.4 (L) 11/16/2021    ASSESSMENT & PLAN:  No  problem-specific Assessment & Plan notes found for this encounter.    No orders of the defined types were placed in this encounter.  The patient has a good understanding of the overall plan. she agrees with it. she will call with any problems that may develop before the next visit here. Total time spent: 30 mins including face to face time and time spent for planning, charting and co-ordination of care   Suzzette Righter, [place] 12/15/21    I, Gardiner Coins, am acting as a Education administrator for Dr. Lindi Adie

## 2021-12-18 ENCOUNTER — Telehealth: Payer: Self-pay | Admitting: Plastic Surgery

## 2021-12-18 ENCOUNTER — Encounter: Payer: Self-pay | Admitting: Plastic Surgery

## 2021-12-18 ENCOUNTER — Encounter: Payer: Self-pay | Admitting: *Deleted

## 2021-12-18 NOTE — Telephone Encounter (Signed)
12-18-2021; [facility] website; CPT code [phone] approved outpatient; [facility] ?Valid 12-18-2021 to 03-18-2022 ?Authorization # [id number] ?Netty Starring @ [employer] ?

## 2021-12-19 ENCOUNTER — Encounter: Payer: Self-pay | Admitting: Hematology and Oncology

## 2021-12-19 ENCOUNTER — Inpatient Hospital Stay: Payer: 59 | Attending: Hematology and Oncology | Admitting: Hematology and Oncology

## 2021-12-19 ENCOUNTER — Other Ambulatory Visit: Payer: Self-pay

## 2021-12-19 DIAGNOSIS — Z170 Estrogen receptor positive status [ER+]: Secondary | ICD-10-CM | POA: Insufficient documentation

## 2021-12-19 DIAGNOSIS — Z9013 Acquired absence of bilateral breasts and nipples: Secondary | ICD-10-CM | POA: Diagnosis not present

## 2021-12-19 DIAGNOSIS — C50411 Malignant neoplasm of upper-outer quadrant of right female breast: Secondary | ICD-10-CM

## 2021-12-19 DIAGNOSIS — Z79899 Other long term (current) drug therapy: Secondary | ICD-10-CM | POA: Insufficient documentation

## 2021-12-19 DIAGNOSIS — Z5112 Encounter for antineoplastic immunotherapy: Secondary | ICD-10-CM | POA: Diagnosis present

## 2021-12-19 NOTE — Research (Signed)
CompassHER2 Residual Disease, A Double-Blinded, Phase III Randomized Trial of T-DM1 and Placebo Compared with T-DMI and Tucatinib:  ? ?Patient Jenna Cross was identified by Dr Lindi Adie as a potential candidate for the above listed study.  This Clinical Research Nurse met with Jenna Cross, [id number], on 12/19/21 in a manner and location that ensures patient privacy to discuss participation in the above listed research study.  Patient is Accompanied by her father .  A copy of the informed consent document and separate HIPAA Authorization was provided to the patient.  Patient reads, speaks, and understands [place].   ? ?Patient was provided with the business card of this Nurse and encouraged to contact the research team with any questions.  Approximately 20 minutes were spent with the patient reviewing the informed consent documents.  Patient was provided the option of taking informed consent documents home to review and was encouraged to review at their convenience with their support network, including other care providers. Patient took the consent documents home to review. ? ?Research RN to follow-up with patient on Friday, 3/17, to discuss interest in study participation. ? ?Vickii Penna, RN, BSN, CPN ?Clinical Research Nurse I ?[phone] ? ?12/19/2021 1:53 PM ? ? ?

## 2021-12-19 NOTE — Assessment & Plan Note (Signed)
Palpable lump in the right breast. Diagnostic mammogram and [place]: a 3.8 cm mass at the retroareolar right breast, 1 prominent right axillary lymph node with a 4 mm cortex, 1 other which is borderline measuring 3 mm. 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 ?3 o'clock position: Biopsy fibroadenoma ?? ?Treatment plan: ?1. Neoadjuvant chemotherapy with Neligh Perjeta ?6 cycles followed by Herceptin Perjeta maintenance versus Kadcyla maintenance (based on response to neoadjuvant chemo) for 1 year ?2. Bilateral Mastectomies: ?Left mastectomy: Benign ?Right mastectomy: Multifocal ILC 1.8 cm, 0.9 cm, margins negative, 1/2 lymph nodes positive, ER 50%, PR 30%, HER2 1+ IHC ?3. Followed by adjuvant radiation therapy? ?4.??Followed by antiestrogen therapy ?5.??Followed by neratinib ?------------------------------------------------------------------------------------------------------------------------ ?Pathology counseling: I discussed the final pathology report of the patient provided  a copy of this report. I discussed the margins as well as lymph node surgeries. We also discussed the final staging along with previously performed ER/PR and HER-2/neu testing. ? ?I discussed with her about participating in the Compass HER2 clinical trial randomizing patients with residual disease after neoadjuvant chemotherapy to Kadcyla maintenance versus Kadcyla plus Tucatinib ? ? ?

## 2021-12-20 ENCOUNTER — Other Ambulatory Visit: Payer: Self-pay | Admitting: Hematology and Oncology

## 2021-12-20 ENCOUNTER — Telehealth: Payer: Self-pay

## 2021-12-20 ENCOUNTER — Encounter: Payer: Self-pay | Admitting: Hematology and Oncology

## 2021-12-20 ENCOUNTER — Other Ambulatory Visit: Payer: Self-pay | Admitting: *Deleted

## 2021-12-20 ENCOUNTER — Encounter: Payer: Self-pay | Admitting: Plastic Surgery

## 2021-12-20 DIAGNOSIS — C50411 Malignant neoplasm of upper-outer quadrant of right female breast: Secondary | ICD-10-CM

## 2021-12-20 DIAGNOSIS — Z5181 Encounter for therapeutic drug level monitoring: Secondary | ICD-10-CM

## 2021-12-20 MED ORDER — ENOXAPARIN SODIUM 60 MG/0.6ML IJ SOSY
60.0000 mg | PREFILLED_SYRINGE | Freq: Two times a day (BID) | INTRAMUSCULAR | 0 refills | Status: DC
Start: 2021-12-20 — End: 2022-01-26

## 2021-12-20 MED ORDER — LIDOCAINE-PRILOCAINE 2.5-2.5 % EX CREA: TOPICAL_CREAM | CUTANEOUS | 3 refills | Status: DC

## 2021-12-20 NOTE — Telephone Encounter (Signed)
Pt had questions regarding the types of implants she will be receiving. I informed her she could discuss that with Donna Christen tomorrow at her appointment. She could also set up a telephone visit with Dr. Marla Roe if there are still concerns. Pt agreed. ?

## 2021-12-21 ENCOUNTER — Encounter (HOSPITAL_COMMUNITY): Payer: Self-pay

## 2021-12-21 ENCOUNTER — Other Ambulatory Visit: Payer: Self-pay

## 2021-12-21 ENCOUNTER — Ambulatory Visit (INDEPENDENT_AMBULATORY_CARE_PROVIDER_SITE_OTHER): Payer: 59 | Admitting: Physician Assistant

## 2021-12-21 DIAGNOSIS — Z9889 Other specified postprocedural states: Secondary | ICD-10-CM

## 2021-12-21 NOTE — Progress Notes (Addendum)
Patient is a 36 year old female with PMH of bilateral mastectomies immediate reconstruction performed 12/06/2021 by Dr. Marla Roe who presents to clinic for postoperative follow-up. ? ?Patient was last seen here in clinic on 12/15/2021.  At that time, drains had minimal output.  Plan was for them to be taken out next week as well as 1 additional expander fill.  Physical exam was entirely reassuring.  At conclusion of visit, patient had 300/455 cc in each expander. ? ?Today, patient is doing well.  No complaints.  She is continuing to wear compressive garments and avoid lifting her small children.  She feels prepared for expander fill.  She tells me that she was a small C cup prior to her double mastectomy and hopes to be approximately the same size. Draining less than 20 cc each side. ? ?Physical exam is reassuring.  No obvious subcutaneous fluid collections noted.  No erythema.  Good symmetry with expanders.  Drains intact and functional with normal appearing drainage in bulbs. ? ?Drains removed without complication or difficulty. ? ?We placed injectable saline in the Expander using a sterile technique: ?Right: 50 cc for a total of 350 / 455 cc ?Left: 50 cc for a total of 350 / 455 cc ? ?Patient is already scheduled for implant exchange on 01/08/2022.  She has preoperative exam scheduled next week.  Picture(s) obtained of the patient and placed in the chart were with the patient's or guardian's permission. ?

## 2021-12-22 ENCOUNTER — Encounter: Payer: Self-pay | Admitting: *Deleted

## 2021-12-22 DIAGNOSIS — C50411 Malignant neoplasm of upper-outer quadrant of right female breast: Secondary | ICD-10-CM

## 2021-12-22 NOTE — Research (Signed)
CompassHER2 Residual Disease, A Double-Blinded, Phase III Randomized Trial of T-DM1 and Placebo Compared with T-DMI and Tucatinib: ? ?Called patient to follow-up on interest in above-listed study. Patient confirmed that she would like to participate. Patient agreed to come in at 8am on 3/24 to sign consent prior to her port flush, MD, and infusion appts that day. Research appt made. Protocol-required lab orders to be added after patient signs consent. ? ?Vickii Penna, RN, BSN, CPN ?Clinical Research Nurse I ?[phone] ? ?12/22/2021 9:45 AM ? ?

## 2021-12-27 ENCOUNTER — Other Ambulatory Visit: Payer: Self-pay

## 2021-12-27 ENCOUNTER — Ambulatory Visit (HOSPITAL_COMMUNITY)
Admission: RE | Admit: 2021-12-27 | Discharge: 2021-12-27 | Disposition: A | Discharge status: Home / Self Care | Payer: 59 | Source: Ambulatory Visit | Attending: Hematology and Oncology | Admitting: Hematology and Oncology

## 2021-12-27 DIAGNOSIS — Z79899 Other long term (current) drug therapy: Secondary | ICD-10-CM | POA: Insufficient documentation

## 2021-12-27 DIAGNOSIS — Z0189 Encounter for other specified special examinations: Secondary | ICD-10-CM | POA: Diagnosis not present

## 2021-12-27 DIAGNOSIS — Z5181 Encounter for therapeutic drug level monitoring: Secondary | ICD-10-CM | POA: Diagnosis not present

## 2021-12-27 LAB — ECHOCARDIOGRAM COMPLETE
AR max vel: 2.45 cm2
AV Area VTI: 2.44 cm2
AV Area mean vel: 2.34 cm2
AV Mean grad: 4 mmHg
AV Peak grad: 6.5 mmHg
Ao pk vel: 1.27 m/s
Area-P 1/2: 4.49 cm2
Calc EF: 55.4 %
MV VTI: 2.55 cm2
S' Lateral: 3.7 cm
Single Plane A2C EF: 53.2 %
Single Plane A4C EF: 56 %

## 2021-12-27 NOTE — Progress Notes (Signed)
? ?  Patient ID: Jenna Cross, female    DOB: 03-Mar-1986, 36 y.o.   MRN: [id number] ? ?Chief Complaint  ?Patient presents with  ? Pre-op Exam  ? ? ?  ICD-10-CM   ?1. S/P breast reconstruction  Z98.890   ?  ? ? ? ?History of Present Illness: ?Jenna Cross is a 36 y.o.  female  with a history of right-sided breast cancer with bilateral mastectomies and immediate reconstruction using tissue expander placement performed 12/06/2021.  She presents for preoperative evaluation for upcoming procedure, removal expanders with implant exchange, scheduled for 01/01/2022 with Dr. Marla Roe. ? ?The patient has not had problems with anesthesia.  Patient was provided with expander fill here in clinic today for a total of 400/455 cc on each side.  She tells me that she would like to be a C cup.  Personal history of DVT involving left IJ from Port-A-Cath.  No other personal or family history of DVT.  Patient has already spoken with her oncologist, Dr. Lindi Adie, who has prescribed her a 3-day course of Lovenox for bridging.  She plans to hold her Xarelto starting tomorrow and restart day after surgery.  Personal history of right-sided breast cancer. ? ?Summary of Previous Visit: She was last seen here in clinic on 12/21/2021.  At that time, she was doing well and physical exam was entirely reassuring.  She expressed that she was hoping to be a small C cup, as she was prior to her double mastectomy.  Drains were removed without complication or difficulty.  Injectable saline was placed in the expanders for a total of 350/455 cc  ? 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 ? 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 ? ? ?Past Medical History: ?Allergies: ?No Known Allergies ? ?Current Medications: ? ?Current Outpatient Medications:  ?  acetaminophen (TYLENOL) 500 MG tablet, Take 1,000 mg by mouth every 6 (six) hours as needed for mild pain or headache., Disp: , Rfl:  ?   diphenoxylate-atropine (LOMOTIL) 2.5-0.025 MG tablet, Take 1 tablet by mouth 3 (three) times daily as needed for diarrhea or loose stools., Disp: 30 tablet, Rfl: 3 ?  enoxaparin (LOVENOX) 60 MG/0.6ML injection, Inject 0.6 mLs (60 mg total) into the skin every 12 (twelve) hours., Disp: 6 mL, Rfl: 0 ?  escitalopram (LEXAPRO) 20 MG tablet, Take 20 mg by mouth daily., Disp: , Rfl:  ?  ibuprofen (ADVIL) 600 MG tablet, Take 1 tablet (600 mg total) by mouth every 6 (six) hours as needed., Disp: 30 tablet, Rfl: 0 ?  lidocaine-prilocaine (EMLA) cream, Apply to affected area once, Disp: 30 g, Rfl: 3 ?  loratadine (CLARITIN) 10 MG tablet, Take 10 mg by mouth daily., Disp: , Rfl:  ?  magic mouthwash w/lidocaine SOLN, Take 5 mLs by mouth 3 (three) times daily as needed for mouth pain., Disp: 100 mL, Rfl: 0 ?  rivaroxaban (XARELTO) 20 MG TABS tablet, Take 1 tablet (20 mg total) by mouth daily with supper., Disp: 30 tablet, Rfl: 5 ?  valACYclovir (VALTREX) 500 MG tablet, Take 500 mg by mouth 2 (two) times daily., Disp: , Rfl:  ? ?Past Medical Problems: ?Past Medical History:  ?Diagnosis Date  ? Anemia   ? Anxiety   ? Anxiety   ? no medication  ? Cancer [facility] [place])   ? breast  ? Family history of prostate cancer 07/17/2021  ? History of DVT (deep vein thrombosis)   ? ? ?Past  Surgical History: ?Past Surgical History:  ?Procedure Laterality Date  ? BREAST RECONSTRUCTION WITH PLACEMENT OF TISSUE EXPANDER AND FLEX HD (ACELLULAR HYDRATED DERMIS) Bilateral 12/06/2021  ? Procedure: BREAST RECONSTRUCTION WITH PLACEMENT OF TISSUE EXPANDER AND FLEX HD (ACELLULAR HYDRATED DERMIS);  Surgeon: Wallace Going, DO;  Location: [place];  Service: Plastics;  Laterality: Bilateral;  ? BREAST SURGERY    ? cyst removal from right breast   ? CESAREAN SECTION N/A 01/31/2021  ? Procedure: CESAREAN SECTION;  Surgeon: Dian Queen, MD;  Location: [place] LD ORS;  Service: Obstetrics;  Laterality: N/A;  ? excision of fibroadenoma of breast    ?  PORTACATH PLACEMENT N/A 07/11/2021  ? Procedure: INSERTION PORT-A-CATH;  Surgeon: Rolm Bookbinder, MD;  Location: [place];  Service: General;  Laterality: N/A;  ? RADIOACTIVE SEED GUIDED AXILLARY SENTINEL LYMPH NODE Right 12/06/2021  ? Procedure: RADIOACTIVE SEED GUIDED RIGHT AXILLARY SENTINEL LYMPH NODE EXCISION;  Surgeon: Rolm Bookbinder, MD;  Location: [place];  Service: General;  Laterality: Right;  ? SENTINEL NODE BIOPSY Right 12/06/2021  ? Procedure: RIGHT AXILLARY SENTINEL NODE BIOPSY;  Surgeon: Rolm Bookbinder, MD;  Location: [place];  Service: General;  Laterality: Right;  ? TONSILLECTOMY    ? and adenoids  ? TONSILLECTOMY AND ADENOIDECTOMY    ? TOTAL MASTECTOMY Bilateral 12/06/2021  ? Procedure: BILATERAL SKIN SPARING TOTAL MASTECTOMY;  Surgeon: Rolm Bookbinder, MD;  Location: [place];  Service: General;  Laterality: Bilateral;  ? WISDOM TOOTH EXTRACTION    ? ? ?Social History: ?Social History  ? ?Socioeconomic History  ? Marital status: Married  ?  Spouse name: Not on file  ? Number of children: 0  ? Years of education: College  ? Highest education level: Not on file  ?Occupational History  ? Not on file  ?Tobacco Use  ? Smoking status: Former  ? Smokeless tobacco: Never  ?Vaping Use  ? Vaping Use: Never used  ?Substance and Sexual Activity  ? Alcohol use: Not Currently  ? Drug use: Never  ? Sexual activity: Yes  ?  Birth control/protection: None  ?Other Topics Concern  ? Not on file  ?Social History Narrative  ? ** Merged History Encounter **  ?    ? Lives at home ?Right handed ?Drinks 1 cup of coffee daily and a couple of sodas per month  ? ?Social Determinants of Health  ? ?Financial Resource Strain: Not on file  ?Food Insecurity: Not on file  ?Transportation Needs: Not on file  ?Physical Activity: Not on file  ?Stress: Not on file  ?Social Connections: Not on file  ?Intimate Partner Violence: Not on file  ? ? ?Family  History: ?Family History  ?Problem Relation Age of Onset  ? Migraines Mother   ? Lymphoma Paternal Aunt   ?     dx late 64s  ? Prostate cancer Maternal Grandfather   ?     dx after 50  ? ? ?Review of Systems: ?ROS ?Denies any recent chest pain, difficulty breathing, leg swelling, cough, or fevers. ? ?Physical Exam: ?Vital Signs ?BP 120/81 (BP Location: Left Arm, Patient Position: Sitting, Cuff Size: Small)   Pulse 89   Ht '5\' 4"'$  (1.626 m)   Wt 132 lb (59.9 kg)   SpO2 98%   BMI 22.66 kg/m?  ? ?Physical Exam ?Constitutional:   ?   General: Not in acute distress. ?   Appearance: Normal appearance. Not ill-appearing.  ?HENT:  ?  Head: Normocephalic and atraumatic.  ?Eyes:  ?   Pupils: Pupils are equal, round. ?Cardiovascular:  ?   Rate and Rhythm: Normal rate. ?   Pulses: Normal pulses.  ?Pulmonary:  ?   Effort: No respiratory distress or increased work of breathing.  Speaks in full sentences. ?Abdominal:  ?   General: Abdomen is flat. No distension.   ?Musculoskeletal: Normal range of motion. No lower extremity swelling or edema. No varicosities. ?Skin: ?   General: Skin is warm and dry.  ?   Findings: No erythema or rash.  ?Neurological:  ?   Mental Status: Alert and oriented to person, place, and time.  ?Psychiatric:     ?   Mood and Affect: Mood normal.     ?   Behavior: Behavior normal.  ? ? ?Assessment/Plan: ?The patient is scheduled for bilateral implant exchange with Dr. Marla Roe.  Risks, benefits, and alternatives of procedure discussed, questions answered and consent obtained.   ? ?Smoking Status: Non-smoker. ?Last Mammogram: S/p double mastectomy ? ?Caprini Score: 9; Risk Factors include: Personal history of DVT, history of breast cancer, port, and length of planned surgery. Recommendation for mechanical and pharmacological prophylaxis. Encourage early ambulation.  For her first phase of reconstruction, plan was to begin Lovenox 3 days prior to surgery to bridge from Xarelto.  May resume Xarelto day  after surgery.  Same plan this time around per Dr. Lindi Adie.   ? ?Pictures obtained: 12/21/2021 ? ?Post-op Rx sent to pharmacy: Keflex.  She reports that she has ample Zofran and oxycodone remaining from first phase of her reconstr

## 2021-12-27 NOTE — Progress Notes (Signed)
Location of Breast Cancer: upper-outer quadrant of right breast ?  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   ?  ?Receptor Status:  ?The tumor cells are POSITIVE for Her2 (3+). ?Estrogen Receptor: 95%, POSITIVE, STRONG STAINING INTENSITY ?Progesterone Receptor: 85%, POSITIVE, STRONG STAINING INTENSITY ?Proliferation Marker Ki67: 25% ?  ?Did patient present with symptoms (if so, please note symptoms) or was this found on screening mammography?: Palpable lump in the right breast ?  ?Past/Anticipated interventions by surgeon, if any:  ?Procedure:  ?Left risk reducing skin sparing mastectomy ?Right skin sparing mastectomy ?Right axillary node seed guided excision ?Right deep axillary sentinel node biopsy ?Injection magtrace for sentinel node identification ?Surgeon: Dr Serita Grammes ?  ?Past/Anticipated interventions by medical oncology, if any: Dr Lindi Adie ?Patient is on Treatment Plan : BREAST  Docetaxel + Carboplatin + Trastuzumab + Pertuzumab  (TCHP) q21d   ?  ?Treatment plan: ?1. Neoadjuvant chemotherapy with [place] Perjeta ?6 cycles followed by Herceptin Perjeta maintenance versus Kadcyla maintenance (based on response to neoadjuvant chemo) for 1 year ?2. Bilateral Mastectomies: ?Left mastectomy: Benign ?Right mastectomy: Multifocal ILC 1.8 cm, 0.9 cm, margins negative, 1/2 lymph nodes positive, ER 50%, PR 30%, HER2 1+ IHC ?3. Followed by adjuvant radiation therapy  ?4.  Followed by antiestrogen therapy ?5.  Followed by neratinib ?  ?Lymphedema issues, if any:   no ?  ?Pain issues, if any:   no ?  ?SAFETY ISSUES: ?Prior radiation?  no ?Pacemaker/ICD? no ?Possible current pregnancy? chemotherapy is finished ?Is the patient on methotrexate? no ?  ?Current Complaints / other details:  nothing of note ? ?Vitals:  ? 01/01/22 0744  ?BP: 112/68  ?Pulse: (!) 110  ?Resp: 18  ?Temp: (!) 96.4 ?F (35.8 ?C)  ?TempSrc: Temporal   ?SpO2: 97%  ?Weight: 131 lb 8 oz (59.6 kg)  ?Height: 5' 4"  (1.626 m)  ? ? ?

## 2021-12-27 NOTE — H&P (View-Only) (Signed)
? ?  Patient ID: Jenna Cross, female    DOB: 1986/03/02, 36 y.o.   MRN: [id number] ? ?Chief Complaint  ?Patient presents with  ? Pre-op Exam  ? ? ?  ICD-10-CM   ?1. S/P breast reconstruction  Z98.890   ?  ? ? ? ?History of Present Illness: ?Jenna Cross is a 36 y.o.  female  with a history of right-sided breast cancer with bilateral mastectomies and immediate reconstruction using tissue expander placement performed 12/06/2021.  She presents for preoperative evaluation for upcoming procedure, removal expanders with implant exchange, scheduled for 01/01/2022 with Dr. Marla Cross. ? ?The patient has not had problems with anesthesia.  Patient was provided with expander fill here in clinic today for a total of 400/455 cc on each side.  She tells me that she would like to be a C cup.  Personal history of DVT involving left IJ from Port-A-Cath.  No other personal or family history of DVT.  Patient has already spoken with her oncologist, Jenna Cross, who has prescribed her a 3-day course of Lovenox for bridging.  She plans to hold her Xarelto starting tomorrow and restart day after surgery.  Personal history of right-sided breast cancer. ? ?Summary of Previous Visit: She was last seen here in clinic on 12/21/2021.  At that time, she was doing well and physical exam was entirely reassuring.  She expressed that she was hoping to be a small C cup, as she was prior to her double mastectomy.  Drains were removed without complication or difficulty.  Injectable saline was placed in the expanders for a total of 350/455 cc  ? 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 ? 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 ? ? ?Past Medical History: ?Allergies: ?No Known Allergies ? ?Current Medications: ? ?Current Outpatient Medications:  ?  acetaminophen (TYLENOL) 500 MG tablet, Take 1,000 mg by mouth every 6 (six) hours as needed for mild pain or headache., Disp: , Rfl:  ?   diphenoxylate-atropine (LOMOTIL) 2.5-0.025 MG tablet, Take 1 tablet by mouth 3 (three) times daily as needed for diarrhea or loose stools., Disp: 30 tablet, Rfl: 3 ?  enoxaparin (LOVENOX) 60 MG/0.6ML injection, Inject 0.6 mLs (60 mg total) into the skin every 12 (twelve) hours., Disp: 6 mL, Rfl: 0 ?  escitalopram (LEXAPRO) 20 MG tablet, Take 20 mg by mouth daily., Disp: , Rfl:  ?  ibuprofen (ADVIL) 600 MG tablet, Take 1 tablet (600 mg total) by mouth every 6 (six) hours as needed., Disp: 30 tablet, Rfl: 0 ?  lidocaine-prilocaine (EMLA) cream, Apply to affected area once, Disp: 30 g, Rfl: 3 ?  loratadine (CLARITIN) 10 MG tablet, Take 10 mg by mouth daily., Disp: , Rfl:  ?  magic mouthwash w/lidocaine SOLN, Take 5 mLs by mouth 3 (three) times daily as needed for mouth pain., Disp: 100 mL, Rfl: 0 ?  rivaroxaban (XARELTO) 20 MG TABS tablet, Take 1 tablet (20 mg total) by mouth daily with supper., Disp: 30 tablet, Rfl: 5 ?  valACYclovir (VALTREX) 500 MG tablet, Take 500 mg by mouth 2 (two) times daily., Disp: , Rfl:  ? ?Past Medical Problems: ?Past Medical History:  ?Diagnosis Date  ? Anemia   ? Anxiety   ? Anxiety   ? no medication  ? Cancer [facility])   ? breast  ? Family history of prostate cancer 07/17/2021  ? History of DVT (deep vein thrombosis)   ? ? ?Past  Surgical History: ?Past Surgical History:  ?Procedure Laterality Date  ? BREAST RECONSTRUCTION WITH PLACEMENT OF TISSUE EXPANDER AND FLEX HD (ACELLULAR HYDRATED DERMIS) Bilateral 12/06/2021  ? Procedure: BREAST RECONSTRUCTION WITH PLACEMENT OF TISSUE EXPANDER AND FLEX HD (ACELLULAR HYDRATED DERMIS);  Surgeon: Jenna Cross;  Location: [place];  Service: Plastics;  Laterality: Bilateral;  ? BREAST SURGERY    ? cyst removal from right breast   ? CESAREAN SECTION N/A 01/31/2021  ? Procedure: CESAREAN SECTION;  Surgeon: Jenna Cross;  Location: [place] LD ORS;  Service: Obstetrics;  Laterality: N/A;  ? excision of fibroadenoma of breast    ?  PORTACATH PLACEMENT N/A 07/11/2021  ? Procedure: INSERTION PORT-A-CATH;  Surgeon: Jenna Cross;  Location: [place];  Service: General;  Laterality: N/A;  ? RADIOACTIVE SEED GUIDED AXILLARY SENTINEL LYMPH NODE Right 12/06/2021  ? Procedure: RADIOACTIVE SEED GUIDED RIGHT AXILLARY SENTINEL LYMPH NODE EXCISION;  Surgeon: Jenna Cross;  Location: [place];  Service: General;  Laterality: Right;  ? SENTINEL NODE BIOPSY Right 12/06/2021  ? Procedure: RIGHT AXILLARY SENTINEL NODE BIOPSY;  Surgeon: Jenna Cross;  Location: [place];  Service: General;  Laterality: Right;  ? TONSILLECTOMY    ? and adenoids  ? TONSILLECTOMY AND ADENOIDECTOMY    ? TOTAL MASTECTOMY Bilateral 12/06/2021  ? Procedure: BILATERAL SKIN SPARING TOTAL MASTECTOMY;  Surgeon: Jenna Cross;  Location: [place];  Service: General;  Laterality: Bilateral;  ? WISDOM TOOTH EXTRACTION    ? ? ?Social History: ?Social History  ? ?Socioeconomic History  ? Marital status: Married  ?  Spouse name: Not on file  ? Number of children: 0  ? Years of education: College  ? Highest education level: Not on file  ?Occupational History  ? Not on file  ?Tobacco Use  ? Smoking status: Former  ? Smokeless tobacco: Never  ?Vaping Use  ? Vaping Use: Never used  ?Substance and Sexual Activity  ? Alcohol use: Not Currently  ? Drug use: Never  ? Sexual activity: Yes  ?  Birth control/protection: None  ?Other Topics Concern  ? Not on file  ?Social History Narrative  ? ** Merged History Encounter **  ?    ? Lives at home ?Right handed ?Drinks 1 cup of coffee daily and a couple of sodas per month  ? ?Social Determinants of Health  ? ?Financial Resource Strain: Not on file  ?Food Insecurity: Not on file  ?Transportation Needs: Not on file  ?Physical Activity: Not on file  ?Stress: Not on file  ?Social Connections: Not on file  ?Intimate Partner Violence: Not on file  ? ? ?Family  History: ?Family History  ?Problem Relation Age of Onset  ? Migraines Mother   ? Lymphoma Paternal Aunt   ?     dx late 67s  ? Prostate cancer Maternal Grandfather   ?     dx after 50  ? ? ?Review of Systems: ?ROS ?Denies any recent chest pain, difficulty breathing, leg swelling, cough, or fevers. ? ?Physical Exam: ?Vital Signs ?BP 120/81 (BP Location: Left Arm, Patient Position: Sitting, Cuff Size: Small)   Pulse 89   Ht '5\' 4"'$  (1.626 m)   Wt 132 lb (59.9 kg)   SpO2 98%   BMI 22.66 kg/m?  ? ?Physical Exam ?Constitutional:   ?   General: Not in acute distress. ?   Appearance: Normal appearance. Not ill-appearing.  ?HENT:  ?  Head: Normocephalic and atraumatic.  ?Eyes:  ?   Pupils: Pupils are equal, round. ?Cardiovascular:  ?   Rate and Rhythm: Normal rate. ?   Pulses: Normal pulses.  ?Pulmonary:  ?   Effort: No respiratory distress or increased work of breathing.  Speaks in full sentences. ?Abdominal:  ?   General: Abdomen is flat. No distension.   ?Musculoskeletal: Normal range of motion. No lower extremity swelling or edema. No varicosities. ?Skin: ?   General: Skin is warm and dry.  ?   Findings: No erythema or rash.  ?Neurological:  ?   Mental Status: Alert and oriented to person, place, and time.  ?Psychiatric:     ?   Mood and Affect: Mood normal.     ?   Behavior: Behavior normal.  ? ? ?Assessment/Plan: ?The patient is scheduled for bilateral implant exchange with Dr. Marla Cross.  Risks, benefits, and alternatives of procedure discussed, questions answered and consent obtained.   ? ?Smoking Status: Non-smoker. ?Last Mammogram: S/p double mastectomy ? ?Caprini Score: 9; Risk Factors include: Personal history of DVT, history of breast cancer, port, and length of planned surgery. Recommendation for mechanical and pharmacological prophylaxis. Encourage early ambulation.  For her first phase of reconstruction, plan was to begin Lovenox 3 days prior to surgery to bridge from Xarelto.  May resume Xarelto day  after surgery.  Same plan this time around per Jenna Cross.   ? ?Pictures obtained: 12/21/2021 ? ?Post-op Rx sent to pharmacy: Keflex.  She reports that she has ample Zofran and oxycodone remaining from first phase of her reconstr

## 2021-12-28 ENCOUNTER — Ambulatory Visit (INDEPENDENT_AMBULATORY_CARE_PROVIDER_SITE_OTHER): Payer: 59 | Admitting: Physician Assistant

## 2021-12-28 ENCOUNTER — Other Ambulatory Visit: Payer: Self-pay

## 2021-12-28 ENCOUNTER — Encounter: Payer: 59 | Admitting: Physician Assistant

## 2021-12-28 VITALS — BP 120/81 | HR 89 | Ht 64.0 in | Wt 132.0 lb

## 2021-12-28 DIAGNOSIS — Z9889 Other specified postprocedural states: Secondary | ICD-10-CM

## 2021-12-28 MED ORDER — CEPHALEXIN 500 MG PO CAPS: 500.0000 mg | ORAL_CAPSULE | Freq: Four times a day (QID) | ORAL | 0 refills | Status: AC

## 2021-12-28 NOTE — Assessment & Plan Note (Addendum)
Palpable lump in the right breast. Diagnostic mammogram and [place]: a 3.8 cm mass at the retroareolar right breast, 1 prominent right axillary lymph node with a 4 mm cortex, 1 other which is borderline measuring 3 mm. 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 ?3 o'clock position: Biopsy fibroadenoma ?? ?Treatment plan: ?1. Neoadjuvant chemotherapy with [place] Perjeta ?6 cycles followed by Herceptin Perjeta maintenance versus Kadcyla maintenance (based on response to neoadjuvant chemo) for 1 year ?2. Bilateral Mastectomies: ?Left mastectomy: Benign ?Right mastectomy: Multifocal ILC 1.8 cm, 0.9 cm, margins negative, 1/2 lymph nodes positive, ER 50%, PR 30%, HER2 1+ IHC ?3.  [facility] HER2 clinical trial: Randomization between Kadcyla versus Kadcyla plus Tucatinib ?4. Followed by adjuvant radiation therapy? ?5.??Followed by antiestrogen therapy ? ?------------------------------------------------------------------------------------------------------------------------ ?Current treatment: Kadcyla cycle 1 maintenance ?Patient signed the consent for [facility] HER2 clinical trial ?Echocardiogram March 2023: EF 55 to 60% ? ?She is going to undergo breast reconstruction surgery for placement of implants on Monday ? ?Return to clinic every 3 weeks for Kadcyla plus or minus Tucatinib ?

## 2021-12-28 NOTE — Progress Notes (Signed)
? ?Patient Care Team: ?Dian Queen, MD as PCP - General (Obstetrics and Gynecology) ?Dian Queen, MD (Obstetrics and Gynecology) ?Mauro Kaufmann, RN as Oncology Nurse Navigator ?Rockwell Germany, RN as Oncology Nurse Navigator ?Nicholas Lose, MD as Consulting Physician (Hematology and Oncology) ?Gery Pray, MD as Consulting Physician (Radiation Oncology) ?Rolm Bookbinder, MD as Consulting Physician (General Surgery) ? ?DIAGNOSIS:  ?Encounter Diagnosis  ?Name Primary?  ? Malignant neoplasm of upper-outer quadrant of right breast in female, estrogen receptor positive ([place])   ? ? ?SUMMARY OF ONCOLOGIC HISTORY: ?Oncology History  ?Malignant neoplasm of upper-outer quadrant of right breast in female, estrogen receptor positive ([place])  ?06/23/2021 Initial Diagnosis  ? Palpable lump in the right breast. Diagnostic mammogram and [place]: a 3.8 cm mass at the retroareolar right breast, 1 prominent right axillary lymph node with a 4 mm cortex, 1 other which is borderline measuring 3 mm. 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  3 o'clock position: Biopsy fibroadenoma ?  ?07/12/2021 -  Neo-Adjuvant Chemotherapy  ? Neoadjuvant chemotherapy with [place] Perjeta ?6 cycles followed by Herceptin Perjeta maintenance versus Kadcyla maintenance (based on response to neoadjuvant chemo) for 1 year ?  ?07/28/2021 Genetic Testing  ? Negative hereditary cancer genetic testing: no pathogenic variants detected in [facility] CancerNext-Expanded +RNAinsight Panel.  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  The report date is July 28, 2021.  ? ?The CancerNext-Expanded gene panel offered by [facility] and includes sequencing, rearrangement, and RNA analysis for the following 77 genes: AIP, ALK, APC, ATM, AXIN2, BAP1, BARD1, BLM, BMPR1A, BRCA1, BRCA2, BRIP1, CDC73, CDH1, CDK4, CDKN1B, CDKN2A, CHEK2, CTNNA1, DICER1, FANCC, FH,  FLCN, GALNT12, KIF1B, LZTR1, MAX, MEN1, MET, MLH1, MSH2, MSH3, MSH6, MUTYH, NBN, NF1, NF2, NTHL1, PALB2, PHOX2B, PMS2, POT1, PRKAR1A, PTCH1, PTEN, RAD51C, RAD51D, RB1, RECQL, RET, SDHA, SDHAF2, SDHB, SDHC, SDHD, SMAD4, SMARCA4, SMARCB1, SMARCE1, STK11, SUFU, TMEM127, TP53, TSC1, TSC2, VHL and XRCC2 (sequencing and deletion/duplication); EGFR, EGLN1, HOXB13, KIT, MITF, PDGFRA, POLD1, and POLE (sequencing only); EPCAM and GREM1 (deletion/duplication only).  ?  ?12/06/2021 Surgery  ? Bilateral Mastectomies: ?Left mastectomy: Benign ?Right mastectomy: Multifocal ILC 1.8 cm, 0.9 cm, margins negative, 1/2 lymph nodes positive, ER 50%, PR 30%, HER2 1+ IHC ?  ?12/26/2021 -  Chemotherapy  ? Patient is on Treatment Plan : BREAST ADO-Trastuzumab Emtansine (Kadcyla) q21d  ?   ? ? ?CHIEF COMPLIANT: Follow-up of right breast cancer  ? ?INTERVAL HISTORY: Jenna Cross is a  36 y.o. with above-mentioned history of right breast cancer, currently on chemotherapy with TCHP. She presents to the clinic today for follow-up. She states that everything is going fine. ?She had an appointment with Dr. Donne Hazel and her breast reconstruction surgery has been moved up to next Monday. ?There was a concern about delays to radiation therapy if she were to have any complications from the breast implant surgery.  She is very concerned about those concerns. ? ? ?ALLERGIES:  has No Known Allergies. ? ?MEDICATIONS:  ?Current Outpatient Medications  ?Medication Sig Dispense Refill  ? acetaminophen (TYLENOL) 500 MG tablet Take 1,000 mg by mouth every 6 (six) hours as needed for mild pain or headache.    ? ALPRAZolam (XANAX) 0.5 MG tablet Take 0.5 mg by mouth 3 (three) times daily as needed for anxiety.    ? cephALEXin (KEFLEX) 500 MG capsule Take 1 capsule (500 mg total) by mouth 4 (four) times daily for 3 days. 12 capsule 0  ?  diphenoxylate-atropine (LOMOTIL) 2.5-0.025 MG tablet Take 1 tablet by mouth 3 (three) times daily as needed for diarrhea or  loose stools. 30 tablet 3  ? enoxaparin (LOVENOX) 60 MG/0.6ML injection Inject 0.6 mLs (60 mg total) into the skin every 12 (twelve) hours. 6 mL 0  ? escitalopram (LEXAPRO) 20 MG tablet Take 20 mg by mouth every evening.    ? lidocaine-prilocaine (EMLA) cream Apply to affected area once (Patient taking differently: Apply 1 application. topically as needed (for [facility] access). Apply to affected area once) 30 g 3  ? loratadine (CLARITIN) 10 MG tablet Take 10 mg by mouth daily as needed for allergies.    ? magic mouthwash w/lidocaine SOLN Take 5 mLs by mouth 3 (three) times daily as needed for mouth pain. (Patient not taking: Reported on 12/28/2021) 100 mL 0  ? rivaroxaban (XARELTO) 20 MG TABS tablet Take 1 tablet (20 mg total) by mouth daily with supper. 30 tablet 5  ? valACYclovir (VALTREX) 500 MG tablet Take 500 mg by mouth every evening.    ? ?No current facility-administered medications for this visit.  ? ? ?PHYSICAL EXAMINATION: ?ECOG PERFORMANCE STATUS: 1 - Symptomatic but completely ambulatory ? ?Vitals:  ? 12/29/21 0911  ?BP: (!) 114/56  ?Pulse: 90  ?Resp: 19  ?Temp: (!) 97.3 ?F (36.3 ?C)  ?SpO2: 100%  ? ?Filed Weights  ? 12/29/21 0911  ?Weight: 132 lb 6.4 oz (60.1 kg)  ? ?  ? ?LABORATORY DATA:  ?I have reviewed the data as listed ? ?  Latest Ref Rng & Units 12/29/2021  ?  8:55 AM 11/16/2021  ?  8:10 AM 10/26/2021  ?  8:30 AM  ?CMP  ?Glucose 70 - 99 mg/dL 93   89   90    ?BUN 6 - 20 mg/dL 16   10   13     ?Creatinine 0.44 - 1.00 mg/dL 0.75   0.63   0.57    ?Sodium 135 - 145 mmol/L 141   141   140    ?Potassium 3.5 - 5.1 mmol/L 4.0   3.8   3.4    ?Chloride 98 - 111 mmol/L 107   110   108    ?CO2 22 - 32 mmol/L 29   27   26     ?Calcium 8.9 - 10.3 mg/dL 9.2   8.6   9.5    ?Total Protein 6.5 - 8.1 g/dL 6.4   6.1   6.5    ?Total Bilirubin 0.3 - 1.2 mg/dL 0.3   0.4   0.4    ?Alkaline Phos 38 - 126 U/L 99   92   96    ?AST 15 - 41 U/L 13   16   12     ?ALT 0 - 44 U/L 9   16   12     ? ? ?Lab Results  ?Component Value Date  ?  WBC 5.9 12/29/2021  ? HGB 12.3 12/29/2021  ? HCT 37.2 12/29/2021  ? MCV 98.9 12/29/2021  ? PLT 206 12/29/2021  ? NEUTROABS 2.7 12/29/2021  ? ? ?ASSESSMENT & PLAN:  ?Malignant neoplasm of upper-outer quadrant of right breast in female, estrogen receptor positive ([place]) ?Palpable lump in the right breast. Diagnostic mammogram and [place]: a 3.8 cm mass at the retroareolar right breast, 1 prominent right axillary lymph node with a 4 mm cortex, 1 other which is borderline measuring 3 mm. 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  3  o'clock position: Biopsy fibroadenoma ?  ?Treatment plan: ?1. Neoadjuvant chemotherapy with [place] Perjeta ?6 cycles followed by Herceptin Perjeta maintenance versus Kadcyla maintenance (based on response to neoadjuvant chemo) for 1 year ?2. Bilateral Mastectomies: ?Left mastectomy: Benign ?Right mastectomy: Multifocal ILC 1.8 cm, 0.9 cm, margins negative, 1/2 lymph nodes positive, ER 50%, PR 30%, HER2 1+ IHC ?3.  [facility]ass HER2 clinical trial: Randomization between Kadcyla versus Kadcyla plus Tucatinib ?4. Followed by adjuvant radiation therapy  ?5.  Followed by antiestrogen therapy ? ?------------------------------------------------------------------------------------------------------------------------ ?Current treatment: Kadcyla cycle 1 maintenance ?Patient signed the consent for Compass HER2 clinical trial ?Echocardiogram March 2023: EF 55 to 60% ? ?She is going to undergo breast reconstruction surgery for placement of implants on Monday ?She will start Lovenox for bridging therapy and will stop Xarelto. ?Xarelto will be resumed after surgery. ?Return to clinic every 3 weeks for Kadcyla plus or minus Tucatinib ? ? ? ?No orders of the defined types were placed in this encounter. ? ?The patient has a good understanding of the overall plan. she agrees with it. she will call with any problems that may develop before the next  visit here. ?Total time spent: 30 mins including face to face time and time spent for planning, charting and co-ordination of care ? ? Harriette Ohara, MD ?12/29/21 ? ? ? I Gardiner Coins am scribing for Dr.

## 2021-12-29 ENCOUNTER — Other Ambulatory Visit: Payer: Self-pay

## 2021-12-29 ENCOUNTER — Inpatient Hospital Stay: Payer: 59

## 2021-12-29 ENCOUNTER — Encounter: Payer: 59 | Admitting: Surgical

## 2021-12-29 ENCOUNTER — Encounter (HOSPITAL_COMMUNITY): Payer: Self-pay | Admitting: Plastic Surgery

## 2021-12-29 ENCOUNTER — Inpatient Hospital Stay (HOSPITAL_BASED_OUTPATIENT_CLINIC_OR_DEPARTMENT_OTHER): Payer: 59 | Admitting: Hematology and Oncology

## 2021-12-29 VITALS — BP 107/62 | HR 87 | Resp 16

## 2021-12-29 DIAGNOSIS — Z170 Estrogen receptor positive status [ER+]: Secondary | ICD-10-CM

## 2021-12-29 DIAGNOSIS — C50411 Malignant neoplasm of upper-outer quadrant of right female breast: Secondary | ICD-10-CM | POA: Diagnosis not present

## 2021-12-29 DIAGNOSIS — Z95828 Presence of other vascular implants and grafts: Secondary | ICD-10-CM

## 2021-12-29 DIAGNOSIS — Z5112 Encounter for antineoplastic immunotherapy: Secondary | ICD-10-CM | POA: Diagnosis not present

## 2021-12-29 LAB — CBC WITH DIFFERENTIAL (CANCER CENTER ONLY)
Abs Immature Granulocytes: 0.01 10*3/uL (ref 0.00–0.07)
Basophils Absolute: 0.1 10*3/uL (ref 0.0–0.1)
Basophils Relative: 1 %
Eosinophils Absolute: 1 10*3/uL — ABNORMAL HIGH (ref 0.0–0.5)
Eosinophils Relative: 17 %
HCT: 37.2 % (ref 36.0–46.0)
Hemoglobin: 12.3 g/dL (ref 12.0–15.0)
Immature Granulocytes: 0 %
Lymphocytes Relative: 29 %
Lymphs Abs: 1.7 10*3/uL (ref 0.7–4.0)
MCH: 32.7 pg (ref 26.0–34.0)
MCHC: 33.1 g/dL (ref 30.0–36.0)
MCV: 98.9 fL (ref 80.0–100.0)
Monocytes Absolute: 0.4 10*3/uL (ref 0.1–1.0)
Monocytes Relative: 7 %
Neutro Abs: 2.7 10*3/uL (ref 1.7–7.7)
Neutrophils Relative %: 46 %
Platelet Count: 206 10*3/uL (ref 150–400)
RBC: 3.76 MIL/uL — ABNORMAL LOW (ref 3.87–5.11)
RDW: 12 % (ref 11.5–15.5)
WBC Count: 5.9 10*3/uL (ref 4.0–10.5)
nRBC: 0 % (ref 0.0–0.2)

## 2021-12-29 LAB — CMP (CANCER CENTER ONLY)
ALT: 9 U/L (ref 0–44)
AST: 13 U/L — ABNORMAL LOW (ref 15–41)
Albumin: 3.9 g/dL (ref 3.5–5.0)
Alkaline Phosphatase: 99 U/L (ref 38–126)
Anion gap: 5 (ref 5–15)
BUN: 16 mg/dL (ref 6–20)
CO2: 29 mmol/L (ref 22–32)
Calcium: 9.2 mg/dL (ref 8.9–10.3)
Chloride: 107 mmol/L (ref 98–111)
Creatinine: 0.75 mg/dL (ref 0.44–1.00)
GFR, Estimated: >60 mL/min (ref >60)
Glucose, Bld: 93 mg/dL (ref 70–99)
Potassium: 4 mmol/L (ref 3.5–5.1)
Sodium: 141 mmol/L (ref 135–145)
Total Bilirubin: 0.3 mg/dL (ref 0.3–1.2)
Total Protein: 6.4 g/dL — ABNORMAL LOW (ref 6.5–8.1)

## 2021-12-29 MED ORDER — SODIUM CHLORIDE 0.9 % IV SOLN: Freq: Once | INTRAVENOUS | Status: AC

## 2021-12-29 MED ORDER — SODIUM CHLORIDE 0.9 % IV SOLN
3.6000 mg/kg | Freq: Once | INTRAVENOUS | Status: AC
  Administered 2021-12-29: 220 mg via INTRAVENOUS
  Filled 2021-12-29: qty 8

## 2021-12-29 MED ORDER — ACETAMINOPHEN 325 MG PO TABS
650.0000 mg | ORAL_TABLET | Freq: Once | ORAL | Status: AC
  Administered 2021-12-29: 650 mg via ORAL
  Filled 2021-12-29: qty 2

## 2021-12-29 MED ORDER — DIPHENHYDRAMINE HCL 25 MG PO CAPS
50.0000 mg | ORAL_CAPSULE | Freq: Once | ORAL | Status: AC
  Administered 2021-12-29: 50 mg via ORAL
  Filled 2021-12-29: qty 2

## 2021-12-29 MED ORDER — SODIUM CHLORIDE 0.9% FLUSH
10.0000 mL | INTRAVENOUS | Status: AC | PRN
  Administered 2021-12-29: 10 mL

## 2021-12-29 MED ORDER — SODIUM CHLORIDE 0.9% FLUSH
10.0000 mL | INTRAVENOUS | Status: DC | PRN
  Administered 2021-12-29: 10 mL

## 2021-12-29 MED ORDER — HEPARIN SOD (PORK) LOCK FLUSH 100 UNIT/ML IV SOLN
500.0000 [IU] | Freq: Once | INTRAVENOUS | Status: AC | PRN
  Administered 2021-12-29: 500 [IU]

## 2021-12-29 NOTE — Research (Addendum)
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 ?Patient presented to clinic this morning for consent + baseline assessments. ? ?CONSENT: Patient met with research and signed consent for above-listed study at 835AM. ? ?ECHO: Post-neoadjuvant chemotherapy ECHO obtained on 12/27/21; consistent with baseline LVEF. ? ?LABS: Standard of care labs collected via port-a-cath.  ? ?VITAL SIGNS: Completed, including height and weight. See vital signs flowsheet. ? ?H&P: Completed by Dr Lindi Adie. See clinic note dated 12/29/21. ? ?INFUSION: Patient presented to infusion room for first cycle of kadcyla. Per protocol, patient is allowed 1 cycle of adjuvant therapy prior to registration.  ? ?RESEARCH: Patient's baseline medical history obtained and concomitant medication list updated. Patient reported intermittent fatigue (grade 1) relieved by rest since the birth of her first child. She also reported moderate anxiety (grade 2) since approximately 2008. She manages this with daily escitalopram oxalate and PRN alprazolam. Patient has a history of herpes simplex virus (cold sores) since approximately 2020, which she manages with daily valacyclovir. She also reports a vascular access complication (grade 3); a left IJ deep venous thrombosis diagnosed on 09/26/21. She manages this with daily rivaroxaban. In addition, per protocol, patient's toxicities from prior chemotherapy reviewed; she denies lingering side effects from [facility] Solicited adverse events assessed per protocol. See table below. ? ?ADVERSE EVENT LOG:  ?Study/Protocol: [facility] [id number] CompassHER2 ?Cycle: Baseline ? ?Event Grade CTCAE v5.0 Onset Date Resolved Date Attribution to Study Treatment Treatment Comments  ?Neutrophil count decreased 0 N/A N/A N/A  N/A   ?Platelet count decreased 0 N/A N/A N/A N/A   ?Rash (maculo-papular) 0 N/A N/A N/A N/A   ?Epistaxis 0 N/A N/A N/A N/A   ?Diarrhea 0 N/A  N/A N/A N/A Patient reported a baseline of 2-3 stools/day  ?Nausea 0 N/A N/A N/A N/A   ?Vomiting 0 N/A N/A N/A N/A   ?Bilirubin increased 0 N/A N/A N/A N/A   ?AST increased 0 N/A N/A N/A N/A   ?ALT increased 0 N/A N/A N/A N/A   ?Anemia 0 N/A N/A N/A N/A   ?Mucositis oral 0 N/A N/A N/A N/A   ?Palmar-plantar erythrodysesthesia 0 N/A N/A N/A N/A   ?Peripheral neuropathy -- motor 0 N/A N/A N/A N/A   ?Peripheral neuropathy -- sensory 0 N/A N/A N/A N/A   ? ?PLAN: Patient's rad-onc consult and reconstructive surgery scheduled for 01/01/22. Serum pregnancy test scheduled for 01/15/22. Once resulted, final eligibility to be determined. Once eligibility is determined, patient may be registered to study. Target date for first on-study infusion is the week of 4/17 -- 01/26/22 with labs and MD visit prior. Research to coordinate visits and inform patient of appointment dates and times. Patient thanked for her time and voluntary participation in this study. Patient has been provided direct contact information and is encouraged to contact this nurse for any questions or concerns. ? ?Vickii Penna, RN, BSN, CPN ?Clinical Research Nurse I ?[phone] ? ?12/29/2021 1:45 PM ? ? ? ? ?

## 2021-12-29 NOTE — Patient Instructions (Signed)
[facility]   ?Discharge Instructions: ?Thank you for choosing [facility] to provide your oncology and hematology care.  ? ?If you have a lab appointment with the [facility], please go directly to the [facility] and check in at the registration area. ?  ?Wear comfortable clothing and clothing appropriate for easy access to any Portacath or PICC line.  ? ?We strive to give you quality time with your provider. You may need to reschedule your appointment if you arrive late (15 or more minutes).  Arriving late affects you and other patients whose appointments are after yours.  Also, if you miss three or more appointments without notifying the office, you may be dismissed from the clinic at the provider?s discretion.    ?  ?For prescription refill requests, have your pharmacy contact our office and allow 72 hours for refills to be completed.   ? ?Today you received the following chemotherapy and/or immunotherapy agents: Ado-trastuzumab emtansine (Kadcyla)    ?  ?To help prevent nausea and vomiting after your treatment, we encourage you to take your nausea medication as directed. ? ?BELOW ARE SYMPTOMS THAT SHOULD BE REPORTED IMMEDIATELY: ?*FEVER GREATER THAN 100.4 F (38 ?C) OR HIGHER ?*CHILLS OR SWEATING ?*NAUSEA AND VOMITING THAT IS NOT CONTROLLED WITH YOUR NAUSEA MEDICATION ?*UNUSUAL SHORTNESS OF BREATH ?*UNUSUAL BRUISING OR BLEEDING ?*URINARY PROBLEMS (pain or burning when urinating, or frequent urination) ?*BOWEL PROBLEMS (unusual diarrhea, constipation, pain near the anus) ?TENDERNESS IN MOUTH AND THROAT WITH OR WITHOUT PRESENCE OF ULCERS (sore throat, sores in mouth, or a toothache) ?UNUSUAL RASH, SWELLING OR PAIN  ?UNUSUAL VAGINAL DISCHARGE OR ITCHING  ? ?Items with * indicate a potential emergency and should be followed up as soon as possible or go to the Emergency Department if any problems should occur. ? ?Please show the CHEMOTHERAPY ALERT CARD or  IMMUNOTHERAPY ALERT CARD at check-in to the Emergency Department and triage nurse. ? ?Should you have questions after your visit or need to cancel or reschedule your appointment, please contact [facility]  Dept: [phone]  and follow the prompts.  Office hours are 8:00 a.m. to 4:30 p.m. Monday - Friday. Please note that voicemails left after 4:00 p.m. may not be returned until the following business day.  We are closed weekends and major holidays. You have access to a nurse at all times for urgent questions. Please call the main number to the clinic Dept: [phone] and follow the prompts. ? ? ?For any non-urgent questions, you may also contact your provider using MyChart. We now offer e-Visits for anyone 12 and older to request care online for non-urgent symptoms. For details visit [email or web address]. ?  ?Also download the MyChart app! Go to the app store, search "MyChart", open the app, select [facility], and log in with your MyChart username and password. ? ?Due to Covid, a mask is required upon entering the hospital/clinic. If you do not have a mask, one will be given to you upon arrival. For doctor visits, patients may have 1 support person aged 69 or older with them. For treatment visits, patients cannot have anyone with them due to current Covid guidelines and our immunocompromised population.  ? ?

## 2021-12-29 NOTE — Progress Notes (Addendum)
Spoke with pt for pre-op call. Pt denies cardiac history, HTN or Diabetes. Pt is on Xarelto for a blood clot in her neck. Last dose was 12/28/21 PM dose and she started Lovenox today. Will take thru Sunday PM.  ? ?I have left pt a bottle of Pre-surgery Ensure and a bottle of CHG soap at the front desk of the hospital. She will pick these up to use prior to surgery. I reminded her of the shower instructions.  ? ?I did message Dr. Marla Roe about pt having liquids up to 3 hours prior to surgery and she responded that it was fine with her as long as the anesthesiologists are ok.  ?

## 2021-12-29 NOTE — Progress Notes (Signed)
Patient due for Zoladex injection today. Patient would like to hold off on injection for today. Dr. Lindi Adie made aware and agreeable to this.  ?Scheduling message sent to have patient scheduled for upcoming injection appointment. Patient knows to expect phone call from scheduling department to get this done.  ?

## 2021-12-29 NOTE — Progress Notes (Signed)
Patient declined to stay for recommended 90-minute post-observation period following first time Kadcyla infusion. Patient informed that this is highly recommended in an effort to monitor for any infusion-related reactions and symptoms such as fever and chills.  ?Patient verbalized an understanding and agreed to stay for 30-minutes.  ?Vital signs retaken and remained stable. Patient discharged in stable condition and knows to call if she should experience any s/s of an allergic reaction.  ?

## 2021-12-29 NOTE — Research (Signed)
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 ?Patient Jenna Cross was identified by Dr Lindi Adie as a potential candidate for the above listed study. This Clinical Research Nurse met with [name], [id number] on 12/29/21 in a manner and location that ensures patient privacy to discuss participation in the above listed research study. Patient is Unaccompanied. Patient was previously provided with informed consent documents. Patient confirmed they have read the informed consent documents. ? ?As outlined in the informed consent form, this Nurse and [name] discussed the purpose of the research study, the investigational nature of the study, study procedures and requirements for study participation, potential risks and benefits of study participation, as well as alternatives to participation. This study is blinded or double-blinded. The patient understands participation is voluntary and they may withdraw from study participation at any time. Each study arm was reviewed, and randomization discussed. Potential side effects were reviewed with patient as outlined in the consent form, and patient made aware there may be side effects not yet known. The chance of receiving placebo was discussed. Patient understands enrollment is pending full eligibility review.  ? ?Confidentiality and how the patient's information will be used as part of study participation were discussed.  Patient was informed there is not reimbursement provided for their time and effort spent on trial participation. The patient is encouraged to discuss research study participation with their insurance provider to determine what costs they may incur as part of study participation, including research related injury.   ? ?All questions were answered to patient's satisfaction. The informed consent and separate HIPAA Authorization was reviewed page by  page. The patient's mental and emotional status is appropriate to provide informed consent, and the patient verbalizes an understanding of study participation. Patient has agreed to participate in the above listed research study and has voluntarily signed the informed consent (protocol version date 03/30/21 and [place] active date 05/16/21) and separate HIPAA authorization on 12/29/21 at 835AM. The patient was provided with a copy of the signed informed consent form and separate HIPAA Authorization for their reference. No study specific procedures were obtained prior to the signing of the informed consent document. Approximately 30 minutes were spent with the patient reviewing the informed consent documents. Patient was not requested to complete a Release of Information form. ? ?Research to schedule serum pregnancy test before determining eligibility to enroll. Will update patient once appointment has been made. Patient verbalized understanding. Patient thanked for her time and voluntary participation in this study. Patient has been provided with direct contact information and encouraged to contact this nurse for any questions or concerns. ? ?Vickii Penna, RN, BSN, CPN ?Clinical Research Nurse I ?[phone] ? ?12/29/2021 9:02 AM ? ?

## 2021-12-30 NOTE — Progress Notes (Signed)
?Radiation Oncology         [phone] ?________________________________ ? ?Name: Jenna Cross MRN: [id number]  ?Date: 01/01/2022  DOB: [birth year] ? ?Re-Evaluation Note ? ?CC: Dian Queen, MD  Nicholas Lose, MD ? ?  ICD-10-CM   ?1. Malignant neoplasm of upper-outer quadrant of right breast in female, estrogen receptor positive ([place])  C50.411   ? Z17.0   ?  ? ? ?Diagnosis: S/p bilateral mastectomies and neo-adjuvant chemotherapy:  Stage IB (cT2, cN1, cM0) Right Breast UOQ, Invasive Lobular Carcinoma, ER+ / PR+ / Her2-, Grade 2 ? ?Narrative:  The patient returns today to discuss radiation treatment options. She was seen in consultation on 10/11/21.  ? ?She has been treated with neoadjuvant chemotherapy consisting of TCH Perjeta x 6 cycles on 07/12/21 through 11/16/21 followed by Kadcyla maintenance (beginning on 12/29/21) x 1 year under the care of Dr. Lindi Adie.  ? ?Bilateral breast MRI on 10/27/21 showed a good response to neoadjuvant chemotherapy, though numerous residual persistent enhancing small masses throughout all 4 quadrants of the right breast were noted. MRI also showed the biopsy-proven metastatic level 1 right axillary lymph node and the adjacent questionable lymph node to appear normal/stable compared to prior imaging. No MRI evidence of malignancy involving the left breast was appreciated. ? ?She opted to proceed with bilateral skin sparing total mastectomies and nodal biopsies on 12/06/21 under the care of Dr. Donne Hazel. Pathology from the procedure revealed:  ?-- Right mastectomy: grade 2 multifocal invasive lobular carcinoma; all margins negative; margin status to invasive disease of 0.1 cm from the anterior/inferior margin. Prognostic indicators significant for: estrogen receptor 50% positive with weak staining intensity; progesterone receptor 30% positive with strong staining intensity; Proliferation marker Ki67 at <5%; Her2 status negative; Grade 2.  ?-- Left mastectomy: Fibrocystic  changes with focal usual ductal hyperplasia; focal fibroadenomatoid changes with calcifications; focal stromal fibrosis and hemosiderin ?-- Nodal status: 1/2 lymph nodes positive for metastatic carcinoma along the right side. ? ?The patient had expanders placed following mastectomies (250 cc each). During a follow-up with plastic surgery on 12/15/21, the patient had an additional 50 cc/s added to each expander. The patient endorsed that she would like to try and get her expanders exchanged to implants if possible prior to radiation therapy. During an additional follow up with plastic surgery on 12/21/21, the patient was noted to be healing well on examination. She is scheduled for implant exchange on 01/01/22 with Dr. Marla Roe.  ? ?The patient will return to Dr. Lindi Adie following RT to discuss antiestrogen treatment options.  ? ?On review of systems, the patient reports overall feeling well except for fatigue.  This is in part related to her busy lifestyle with 2 small children at home.  She denies significant pain along the bilateral chest regions.  She denies any swelling in the right arm hand or left arm or hand and any other symptoms.  ? ? ?Allergies:  has No Known Allergies. ? ?Meds: ?Current Outpatient Medications  ?Medication Sig Dispense Refill  ? acetaminophen (TYLENOL) 500 MG tablet Take 1,000 mg by mouth every 6 (six) hours as needed for mild pain or headache.    ? ALPRAZolam (XANAX) 0.5 MG tablet Take 0.5 mg by mouth 3 (three) times daily as needed for anxiety.    ? diphenoxylate-atropine (LOMOTIL) 2.5-0.025 MG tablet Take 1 tablet by mouth 3 (three) times daily as needed for diarrhea or loose stools. 30 tablet 3  ? enoxaparin (LOVENOX) 60 MG/0.6ML injection Inject 0.6 mLs (60 mg  total) into the skin every 12 (twelve) hours. 6 mL 0  ? escitalopram (LEXAPRO) 20 MG tablet Take 20 mg by mouth every evening.    ? lidocaine-prilocaine (EMLA) cream Apply to affected area once (Patient taking differently:  Apply 1 application. topically as needed (for [facility] access). Apply to affected area once) 30 g 3  ? loratadine (CLARITIN) 10 MG tablet Take 10 mg by mouth daily as needed for allergies.    ? rivaroxaban (XARELTO) 20 MG TABS tablet Take 1 tablet (20 mg total) by mouth daily with supper. 30 tablet 5  ? valACYclovir (VALTREX) 500 MG tablet Take 500 mg by mouth every evening.    ? magic mouthwash w/lidocaine SOLN Take 5 mLs by mouth 3 (three) times daily as needed for mouth pain. (Patient not taking: Reported on 12/28/2021) 100 mL 0  ? ?No current facility-administered medications for this encounter.  ? ?Facility-Administered Medications Ordered in Other Encounters  ?Medication Dose Route Frequency Provider Last Rate Last Admin  ? Chlorhexidine Gluconate Cloth 2 % PADS 6 each  6 each Topical Once Dillingham, [name], DO      ? fentaNYL (SUBLIMAZE) injection 25-50 mcg  25-50 mcg Intravenous Q5 min PRN Ellender, Karyl Kinnier, MD      ? ketorolac (TORADOL) 30 MG/ML injection 30 mg  30 mg Intravenous Once PRN Ellender, Karyl Kinnier, MD      ? lactated ringers infusion   Intravenous Continuous Santa Lighter, MD 900 mL/hr at 01/01/22 1525 Rate Change at 01/01/22 1525  ? oxyCODONE (Oxy IR/ROXICODONE) immediate release tablet 5 mg  5 mg Oral Once PRN Ellender, Karyl Kinnier, MD      ? Or  ? oxyCODONE (ROXICODONE) 5 MG/5ML solution 5 mg  5 mg Oral Once PRN Ellender, Karyl Kinnier, MD      ? ? ?Physical Findings: ?The patient is in no acute distress. Patient is alert and oriented. ? height is 5' 4"  (1.626 m) and weight is 131 lb 8 oz (59.6 kg). Her temporal temperature is 96.4 ?F (35.8 ?C) (abnormal). Her blood pressure is 112/68 and her pulse is 110 (abnormal). Her respiration is 18 and oxygen saturation is 97%.  No significant changes. Lungs are clear to auscultation bilaterally. Heart has regular rate and rhythm. No palpable cervical, supraclavicular, or axillary adenopathy. Abdomen soft, non-tender, normal bowel sounds. ?Right chest region reveals a  mastectomy scar with tissue expander in place.  No signs of drainage or infection.  Small bandage in place over the mastectomy scar.  The left chest area shows a mastectomy scar with small bandage in place.  Tissue expander also located on the side. ? ?Lab Findings: ?Lab Results  ?Component Value Date  ? WBC 5.9 12/29/2021  ? HGB 12.3 12/29/2021  ? HCT 37.2 12/29/2021  ? MCV 98.9 12/29/2021  ? PLT 206 12/29/2021  ? ? ?Radiographic Findings: ?MM Breast Surgical Specimen ? ?Result Date: 12/06/2021 ?CLINICAL DATA:  Status post targeted lymph node dissection today after earlier radioactive seed localization. EXAM: SPECIMEN RADIOGRAPH OF THE RIGHT axilla COMPARISON:  Previous exam(s). FINDINGS: Status post excision of the RIGHT axilla. The radioactive seed and biopsy marker clip are present and appear completely intact within the specimen. Findings discussed with the OR staff during the procedure. IMPRESSION: Specimen radiograph of the right axilla. Electronically Signed   By: Franki Cabot M.D.   On: 12/06/2021 10:26 ? ?ECHOCARDIOGRAM COMPLETE ? ?Result Date: 12/27/2021 ?   ECHOCARDIOGRAM REPORT   Patient Name:   Jenna  Trish Cross Date of Exam: 12/27/2021 Medical Rec #:  [id number]          Height:       64.0 in Accession #:    [id number]         Weight:       130.7 lb Date of Birth:  03-20-1986         BSA:          1.633 m? Patient Age:    14 years           BP:           130/64 mmHg Patient Gender: F                  HR:           72 bpm. Exam Location:  Inpatient Procedure: 2D Echo, Cardiac Doppler, Color Doppler and Strain Analysis Indications:    Chemo  History:        Patient has prior history of Echocardiogram examinations, most                 recent 09/27/2021.  Sonographer:    Luisa Hart RDCS Referring Phys: [id number] Nicholas Lose  Sonographer Comments: Image acquisition challenging due to breast implants. IMPRESSIONS  1. Left ventricular ejection fraction, by estimation, is 55 to 60%. The left ventricle has  normal function. Left ventricular endocardial border not optimally defined to evaluate regional wall motion. Left ventricular diastolic parameters were normal.  2. Right ventricular systolic function is normal.

## 2022-01-01 ENCOUNTER — Ambulatory Visit
Admission: RE | Admit: 2022-01-01 | Discharge: 2022-01-01 | Disposition: A | Discharge status: Home / Self Care | Payer: 59 | Source: Ambulatory Visit | Attending: Radiation Oncology | Admitting: Radiation Oncology

## 2022-01-01 ENCOUNTER — Encounter: Payer: Self-pay | Admitting: Hematology and Oncology

## 2022-01-01 ENCOUNTER — Ambulatory Visit (HOSPITAL_COMMUNITY): Payer: 59 | Admitting: Anesthesiology

## 2022-01-01 ENCOUNTER — Ambulatory Visit (HOSPITAL_BASED_OUTPATIENT_CLINIC_OR_DEPARTMENT_OTHER): Payer: 59 | Admitting: Anesthesiology

## 2022-01-01 ENCOUNTER — Ambulatory Visit (HOSPITAL_COMMUNITY)
Admission: RE | Admit: 2022-01-01 | Discharge: 2022-01-01 | Disposition: A | Discharge status: Home / Self Care | Payer: 59 | Attending: Plastic Surgery | Admitting: Plastic Surgery

## 2022-01-01 ENCOUNTER — Other Ambulatory Visit: Payer: Self-pay

## 2022-01-01 ENCOUNTER — Telehealth: Payer: Self-pay

## 2022-01-01 ENCOUNTER — Encounter: Payer: Self-pay | Admitting: Radiation Oncology

## 2022-01-01 ENCOUNTER — Telehealth: Payer: Self-pay | Admitting: Hematology and Oncology

## 2022-01-01 ENCOUNTER — Ambulatory Visit: Payer: 59 | Admitting: Radiation Oncology

## 2022-01-01 ENCOUNTER — Encounter (HOSPITAL_COMMUNITY): Payer: Self-pay | Admitting: Plastic Surgery

## 2022-01-01 ENCOUNTER — Encounter (HOSPITAL_COMMUNITY)
Admission: RE | Disposition: A | Discharge status: Home / Self Care | Payer: Self-pay | Source: Home / Self Care | Attending: Plastic Surgery

## 2022-01-01 VITALS — BP 112/68 | HR 110 | Temp 96.4°F | Resp 18 | Ht 64.0 in | Wt 131.5 lb

## 2022-01-01 DIAGNOSIS — R5383 Other fatigue: Secondary | ICD-10-CM | POA: Insufficient documentation

## 2022-01-01 DIAGNOSIS — C50411 Malignant neoplasm of upper-outer quadrant of right female breast: Secondary | ICD-10-CM | POA: Insufficient documentation

## 2022-01-01 DIAGNOSIS — Z853 Personal history of malignant neoplasm of breast: Secondary | ICD-10-CM

## 2022-01-01 DIAGNOSIS — Z9013 Acquired absence of bilateral breasts and nipples: Secondary | ICD-10-CM | POA: Diagnosis not present

## 2022-01-01 DIAGNOSIS — Z79899 Other long term (current) drug therapy: Secondary | ICD-10-CM | POA: Insufficient documentation

## 2022-01-01 DIAGNOSIS — Z170 Estrogen receptor positive status [ER+]: Secondary | ICD-10-CM

## 2022-01-01 DIAGNOSIS — Z45811 Encounter for adjustment or removal of right breast implant: Secondary | ICD-10-CM | POA: Diagnosis not present

## 2022-01-01 DIAGNOSIS — Z7901 Long term (current) use of anticoagulants: Secondary | ICD-10-CM | POA: Insufficient documentation

## 2022-01-01 DIAGNOSIS — F419 Anxiety disorder, unspecified: Secondary | ICD-10-CM | POA: Diagnosis not present

## 2022-01-01 DIAGNOSIS — Z45812 Encounter for adjustment or removal of left breast implant: Secondary | ICD-10-CM | POA: Diagnosis not present

## 2022-01-01 DIAGNOSIS — Z86718 Personal history of other venous thrombosis and embolism: Secondary | ICD-10-CM | POA: Insufficient documentation

## 2022-01-01 DIAGNOSIS — Z421 Encounter for breast reconstruction following mastectomy: Secondary | ICD-10-CM | POA: Insufficient documentation

## 2022-01-01 HISTORY — PX: REMOVAL OF BILATERAL TISSUE EXPANDERS WITH PLACEMENT OF BILATERAL BREAST IMPLANTS: SHX6431

## 2022-01-01 LAB — POCT PREGNANCY, URINE: Preg Test, Ur: NEGATIVE

## 2022-01-01 SURGERY — REMOVAL, TISSUE EXPANDER, BREAST, BILATERAL, WITH BILATERAL IMPLANT IMPLANT INSERTION
Anesthesia: General | Site: Breast | Laterality: Bilateral

## 2022-01-01 MED ORDER — PROPOFOL 10 MG/ML IV BOLUS
INTRAVENOUS | Status: DC | PRN
Start: 2022-01-01 — End: 2022-01-01
  Administered 2022-01-01: 200 mg via INTRAVENOUS

## 2022-01-01 MED ORDER — ONDANSETRON HCL 4 MG/2ML IJ SOLN
INTRAMUSCULAR | Status: DC | PRN
  Administered 2022-01-01: 4 mg via INTRAVENOUS

## 2022-01-01 MED ORDER — DEXAMETHASONE SODIUM PHOSPHATE 10 MG/ML IJ SOLN
INTRAMUSCULAR | Status: DC | PRN
Start: 2022-01-01 — End: 2022-01-01
  Administered 2022-01-01: 5 mg via INTRAVENOUS

## 2022-01-01 MED ORDER — AMISULPRIDE (ANTIEMETIC) 5 MG/2ML IV SOLN
INTRAVENOUS | Status: AC
  Administered 2022-01-01: 10 mg via INTRAVENOUS
  Filled 2022-01-01: qty 4

## 2022-01-01 MED ORDER — LIDOCAINE-EPINEPHRINE 1 %-1:100000 IJ SOLN
INTRAMUSCULAR | Status: DC | PRN
  Administered 2022-01-01: 10 mL

## 2022-01-01 MED ORDER — PHENYLEPHRINE 40 MCG/ML (10ML) SYRINGE FOR IV PUSH (FOR BLOOD PRESSURE SUPPORT)
PREFILLED_SYRINGE | INTRAVENOUS | Status: AC
  Filled 2022-01-01: qty 20

## 2022-01-01 MED ORDER — EPHEDRINE 5 MG/ML INJ
INTRAVENOUS | Status: AC
  Filled 2022-01-01: qty 5

## 2022-01-01 MED ORDER — LIDOCAINE 2% (20 MG/ML) 5 ML SYRINGE
INTRAMUSCULAR | Status: AC
  Filled 2022-01-01: qty 5

## 2022-01-01 MED ORDER — KETOROLAC TROMETHAMINE 30 MG/ML IJ SOLN
INTRAMUSCULAR | Status: AC
  Filled 2022-01-01: qty 1

## 2022-01-01 MED ORDER — ORAL CARE MOUTH RINSE: 15.0000 mL | Freq: Once | OROMUCOSAL | Status: AC

## 2022-01-01 MED ORDER — CEFAZOLIN SODIUM-DEXTROSE 2-4 GM/100ML-% IV SOLN
2.0000 g | INTRAVENOUS | Status: AC
  Administered 2022-01-01: 2 g via INTRAVENOUS
  Filled 2022-01-01: qty 100

## 2022-01-01 MED ORDER — POLYMYXIN B SULFATE 500000 UNITS IJ SOLR
INTRAMUSCULAR | Status: DC | PRN
  Administered 2022-01-01: 500 mL

## 2022-01-01 MED ORDER — PHENYLEPHRINE 40 MCG/ML (10ML) SYRINGE FOR IV PUSH (FOR BLOOD PRESSURE SUPPORT)
PREFILLED_SYRINGE | INTRAVENOUS | Status: AC
  Filled 2022-01-01: qty 10

## 2022-01-01 MED ORDER — PROPOFOL 1000 MG/100ML IV EMUL
INTRAVENOUS | Status: AC
  Filled 2022-01-01: qty 100

## 2022-01-01 MED ORDER — FENTANYL CITRATE (PF) 100 MCG/2ML IJ SOLN: 25.0000 ug | INTRAMUSCULAR | Status: DC | PRN

## 2022-01-01 MED ORDER — CHLORHEXIDINE GLUCONATE CLOTH 2 % EX PADS: 6.0000 | MEDICATED_PAD | Freq: Once | CUTANEOUS | Status: DC

## 2022-01-01 MED ORDER — PROPOFOL 10 MG/ML IV BOLUS
INTRAVENOUS | Status: AC
  Filled 2022-01-01: qty 20

## 2022-01-01 MED ORDER — FENTANYL CITRATE (PF) 250 MCG/5ML IJ SOLN
INTRAMUSCULAR | Status: DC | PRN
  Administered 2022-01-01: 50 ug via INTRAVENOUS

## 2022-01-01 MED ORDER — CHLORHEXIDINE GLUCONATE 0.12 % MT SOLN
15.0000 mL | Freq: Once | OROMUCOSAL | Status: AC
  Administered 2022-01-01: 15 mL via OROMUCOSAL
  Filled 2022-01-01: qty 15

## 2022-01-01 MED ORDER — ONDANSETRON HCL 4 MG/2ML IJ SOLN
INTRAMUSCULAR | Status: AC
  Filled 2022-01-01: qty 4

## 2022-01-01 MED ORDER — OXYCODONE HCL 5 MG PO TABS: 5.0000 mg | ORAL_TABLET | Freq: Once | ORAL | Status: DC | PRN

## 2022-01-01 MED ORDER — KETOROLAC TROMETHAMINE 30 MG/ML IJ SOLN: 30.0000 mg | Freq: Once | INTRAMUSCULAR | Status: DC | PRN

## 2022-01-01 MED ORDER — LACTATED RINGERS IV SOLN: INTRAVENOUS | Status: DC

## 2022-01-01 MED ORDER — DEXAMETHASONE SODIUM PHOSPHATE 10 MG/ML IJ SOLN
INTRAMUSCULAR | Status: AC
  Filled 2022-01-01: qty 1

## 2022-01-01 MED ORDER — SUCCINYLCHOLINE CHLORIDE 200 MG/10ML IV SOSY
PREFILLED_SYRINGE | INTRAVENOUS | Status: AC
  Filled 2022-01-01: qty 10

## 2022-01-01 MED ORDER — DEXAMETHASONE SODIUM PHOSPHATE 10 MG/ML IJ SOLN
INTRAMUSCULAR | Status: AC
  Filled 2022-01-01: qty 2

## 2022-01-01 MED ORDER — 0.9 % SODIUM CHLORIDE (POUR BTL) OPTIME
TOPICAL | Status: DC | PRN
  Administered 2022-01-01: 1000 mL

## 2022-01-01 MED ORDER — MIDAZOLAM HCL 2 MG/2ML IJ SOLN
INTRAMUSCULAR | Status: AC
  Filled 2022-01-01: qty 2

## 2022-01-01 MED ORDER — AMISULPRIDE (ANTIEMETIC) 5 MG/2ML IV SOLN: 10.0000 mg | Freq: Once | INTRAVENOUS | Status: AC | PRN

## 2022-01-01 MED ORDER — ROCURONIUM BROMIDE 10 MG/ML (PF) SYRINGE
PREFILLED_SYRINGE | INTRAVENOUS | Status: AC
  Filled 2022-01-01: qty 10

## 2022-01-01 MED ORDER — LIDOCAINE 2% (20 MG/ML) 5 ML SYRINGE
INTRAMUSCULAR | Status: DC | PRN
  Administered 2022-01-01: 60 mg via INTRAVENOUS

## 2022-01-01 MED ORDER — ACETAMINOPHEN 500 MG PO TABS
1000.0000 mg | ORAL_TABLET | Freq: Once | ORAL | Status: AC
  Administered 2022-01-01: 1000 mg via ORAL
  Filled 2022-01-01: qty 2

## 2022-01-01 MED ORDER — OXYCODONE HCL 5 MG/5ML PO SOLN: 5.0000 mg | Freq: Once | ORAL | Status: DC | PRN

## 2022-01-01 MED ORDER — MIDAZOLAM HCL 2 MG/2ML IJ SOLN
INTRAMUSCULAR | Status: DC | PRN
  Administered 2022-01-01: 2 mg via INTRAVENOUS

## 2022-01-01 MED ORDER — FENTANYL CITRATE (PF) 250 MCG/5ML IJ SOLN
INTRAMUSCULAR | Status: AC
  Filled 2022-01-01: qty 5

## 2022-01-01 MED ORDER — PHENYLEPHRINE 40 MCG/ML (10ML) SYRINGE FOR IV PUSH (FOR BLOOD PRESSURE SUPPORT)
PREFILLED_SYRINGE | INTRAVENOUS | Status: DC | PRN
  Administered 2022-01-01 (×2): 80 ug via INTRAVENOUS

## 2022-01-01 MED ORDER — LIDOCAINE-EPINEPHRINE 1 %-1:100000 IJ SOLN
INTRAMUSCULAR | Status: AC
  Filled 2022-01-01: qty 1

## 2022-01-01 SURGICAL SUPPLY — 57 items
ADH SKN CLS APL DERMABOND .7 (GAUZE/BANDAGES/DRESSINGS)
APL PRP STRL LF DISP 70% ISPRP (MISCELLANEOUS)
BAG COUNTER SPONGE SURGICOUNT (BAG) ×3 IMPLANT
BAG DECANTER FOR FLEXI CONT (MISCELLANEOUS) ×3 IMPLANT
BAG SPNG CNTER NS LX DISP (BAG) ×1
BINDER BREAST LRG (GAUZE/BANDAGES/DRESSINGS) ×1 IMPLANT
BINDER BREAST XLRG (GAUZE/BANDAGES/DRESSINGS) IMPLANT
BIOPATCH RED 1 DISK 7.0 (GAUZE/BANDAGES/DRESSINGS) ×2 IMPLANT
CANISTER SUCT 3000ML PPV (MISCELLANEOUS) ×3 IMPLANT
CHLORAPREP W/TINT 26 (MISCELLANEOUS) ×2 IMPLANT
COVER SURGICAL LIGHT HANDLE (MISCELLANEOUS) ×3 IMPLANT
DERMABOND ADVANCED (GAUZE/BANDAGES/DRESSINGS)
DERMABOND ADVANCED .7 DNX12 (GAUZE/BANDAGES/DRESSINGS) ×2 IMPLANT
DRAIN CHANNEL 19F RND (DRAIN) ×2 IMPLANT
DRAPE HALF SHEET 40X57 (DRAPES) ×4 IMPLANT
DRAPE ORTHO SPLIT 77X108 STRL (DRAPES) ×4
DRAPE SURG 17X23 STRL (DRAPES) ×8 IMPLANT
DRAPE SURG ORHT 6 SPLT 77X108 (DRAPES) ×4 IMPLANT
DRAPE WARM FLUID 44X44 (DRAPES) ×2 IMPLANT
DRSG OPSITE POSTOP 4X6 (GAUZE/BANDAGES/DRESSINGS) ×2 IMPLANT
DRSG PAD ABDOMINAL 8X10 ST (GAUZE/BANDAGES/DRESSINGS) ×6 IMPLANT
ELECT BLADE 4.0 EZ CLEAN MEGAD (MISCELLANEOUS) ×2
ELECT REM PT RETURN 9FT ADLT (ELECTROSURGICAL) ×2
ELECTRODE BLDE 4.0 EZ CLN MEGD (MISCELLANEOUS) ×2 IMPLANT
ELECTRODE REM PT RTRN 9FT ADLT (ELECTROSURGICAL) ×2 IMPLANT
EVACUATOR SILICONE 100CC (DRAIN) ×2 IMPLANT
FUNNEL KELLER 2 DISP (MISCELLANEOUS) ×3 IMPLANT
GAUZE SPONGE 4X4 12PLY STRL (GAUZE/BANDAGES/DRESSINGS) ×2 IMPLANT
GLOVE SURG ENC MOIS LTX SZ6.5 (GLOVE) ×3 IMPLANT
GOWN STRL REUS W/ TWL LRG LVL3 (GOWN DISPOSABLE) ×4 IMPLANT
GOWN STRL REUS W/TWL LRG LVL3 (GOWN DISPOSABLE) ×4
IMPL BREAST SMOOTH UH 455CC (Breast) IMPLANT
IMPLANT BREAST SMOOTH UH 455CC (Breast) ×4 IMPLANT
KIT BASIN OR (CUSTOM PROCEDURE TRAY) ×3 IMPLANT
KIT TURNOVER KIT B (KITS) ×3 IMPLANT
NS IRRIG 1000ML POUR BTL (IV SOLUTION) ×6 IMPLANT
PACK GENERAL/GYN (CUSTOM PROCEDURE TRAY) ×3 IMPLANT
PAD ARMBOARD 7.5X6 YLW CONV (MISCELLANEOUS) ×3 IMPLANT
PIN SAFETY STERILE (MISCELLANEOUS) ×2 IMPLANT
SET ASEPTIC TRANSFER (MISCELLANEOUS) ×1 IMPLANT
SIZER BREAST 440CC (SIZER) ×2
SIZER BREAST REUSE 455CC (SIZER) ×2
SIZER BRST 440CC (SIZER) IMPLANT
SIZER BRST REUSE 455CC (SIZER) IMPLANT
STAPLER VISISTAT 35W (STAPLE) IMPLANT
STRIP CLOSURE SKIN 1/2X4 (GAUZE/BANDAGES/DRESSINGS) ×2 IMPLANT
SUT MNCRL AB 4-0 PS2 18 (SUTURE) ×2 IMPLANT
SUT MON AB 5-0 PS2 18 (SUTURE) ×4 IMPLANT
SUT PDS AB 2-0 CT1 27 (SUTURE) IMPLANT
SUT PDS AB 3-0 SH 27 (SUTURE) ×4 IMPLANT
SUT SILK 3 0 SH 30 (SUTURE) ×2 IMPLANT
SUT VIC AB 3-0 SH 27 (SUTURE)
SUT VIC AB 3-0 SH 27X BRD (SUTURE) ×6 IMPLANT
SUT VICRYL 4-0 PS2 18IN ABS (SUTURE) ×4 IMPLANT
TOWEL GREEN STERILE (TOWEL DISPOSABLE) ×3 IMPLANT
TOWEL GREEN STERILE FF (TOWEL DISPOSABLE) ×3 IMPLANT
TRAY FOLEY MTR SLVR 14FR STAT (SET/KITS/TRAYS/PACK) IMPLANT

## 2022-01-01 NOTE — Telephone Encounter (Signed)
Scheduled appointment per inbasket message. Patient aware.   ?

## 2022-01-01 NOTE — Telephone Encounter (Signed)
LM for patient that this nurse was calling to see how they were doing after their treatment Friday 12-29-21. Please call back to Dr.   Geralyn Flash nurse at [phone] if they have  any questions or concerns. ?

## 2022-01-01 NOTE — Anesthesia Preprocedure Evaluation (Signed)
Anesthesia Evaluation  ?Patient identified by MRN, date of birth, ID band ?Patient awake ? ? ? ?Reviewed: ?Allergy & Precautions, NPO status , Patient's Chart, lab work & pertinent test results ? ?Airway ?Mallampati: II ? ?TM Distance: >3 FB ?Neck ROM: Full ? ? ? Dental ?no notable dental hx. ? ?  ?Pulmonary ?neg pulmonary ROS,  ?  ?Pulmonary exam normal ?breath sounds clear to auscultation ? ? ? ? ? ? Cardiovascular ?+ DVT  ?Normal cardiovascular exam ?Rhythm:Regular Rate:Normal ? ? ?  ?Neuro/Psych ?Anxiety negative neurological ROS ?   ? GI/Hepatic ?negative GI ROS, Neg liver ROS,   ?Endo/Other  ?negative endocrine ROS ? Renal/GU ?negative Renal ROS  ? ?  ?Musculoskeletal ?negative musculoskeletal ROS ?(+)  ? Abdominal ?  ?Peds ? Hematology ?negative hematology ROS ?(+)   ?Anesthesia Other Findings ?Malignant neoplasm of upper-outer quadrant of right breast in female ? Reproductive/Obstetrics ?hcg negative ? ?  ? ? ? ? ? ? ? ? ? ? ? ? ? ?  ?  ? ? ? ? ? ? ? ? ?Anesthesia Physical ?Anesthesia Plan ? ?ASA: 2 ? ?Anesthesia Plan: General  ? ?Post-op Pain Management:   ? ?Induction: Intravenous ? ?PONV Risk Score and Plan: 3 and Ondansetron, Dexamethasone, Midazolam and Treatment may vary due to age or medical condition ? ?Airway Management Planned: LMA ? ?Additional Equipment:  ? ?Intra-op Plan:  ? ?Post-operative Plan: Extubation in OR ? ?Informed Consent: I have reviewed the patients History and Physical, chart, labs and discussed the procedure including the risks, benefits and alternatives for the proposed anesthesia with the patient or authorized representative who has indicated his/her understanding and acceptance.  ? ? ? ?Dental advisory given ? ?Plan Discussed with: CRNA and Anesthesiologist ? ?Anesthesia Plan Comments:   ? ? ? ? ? ? ?Anesthesia Quick Evaluation ? ?

## 2022-01-01 NOTE — Transfer of Care (Signed)
Immediate Anesthesia Transfer of Care Note ? ?Patient: Jenna Cross ? ?Procedure(s) Performed: REMOVAL OF BILATERAL TISSUE EXPANDERS WITH PLACEMENT OF BILATERAL BREAST IMPLANTS (Bilateral: Breast) ? ?Patient Location: PACU ? ?Anesthesia Type:General ? ?Level of Consciousness: drowsy ? ?Airway & Oxygen Therapy: Patient Spontanous Breathing ? ?Post-op Assessment: Report given to RN and Post -op Vital signs reviewed and stable ? ?Post vital signs: Reviewed and stable ? ?Last Vitals:  ?Vitals Value Taken Time  ?BP 125/65   ?Temp    ?Pulse 87 01/01/22 1540  ?Resp    ?SpO2 100 % 01/01/22 1540  ?Vitals shown include unvalidated device data. ? ?Last Pain:  ?Vitals:  ? 01/01/22 1246  ?TempSrc:   ?PainSc: 0-No pain  ?   ? ?Patients Stated Pain Goal: 0 (01/01/22 1246) ? ?Complications: No notable events documented. ?

## 2022-01-01 NOTE — Progress Notes (Signed)
See MD note for nursing evaluation. °

## 2022-01-01 NOTE — Interval H&P Note (Signed)
History and Physical Interval Note: ? ?01/01/2022 ?12:53 PM ? ?Jenna Cross  has presented today for surgery, with the diagnosis of Malignant neoplasm of upper-outer quadrant of right breast in female.  The various methods of treatment have been discussed with the patient and family. After consideration of risks, benefits and other options for treatment, the patient has consented to  Procedure(s) with comments: ?REMOVAL OF BILATERAL TISSUE EXPANDERS WITH PLACEMENT OF BILATERAL BREAST IMPLANTS (Bilateral) - 1.5 hours as a surgical intervention.  The patient's history has been reviewed, patient examined, no change in status, stable for surgery.  I have reviewed the patient's chart and labs.  Questions were answered to the patient's satisfaction.   ? ? ?Jenna Cross ? ? ?

## 2022-01-01 NOTE — Anesthesia Postprocedure Evaluation (Signed)
Anesthesia Post Note ? ?Patient: Jenna Cross ? ?Procedure(s) Performed: REMOVAL OF BILATERAL TISSUE EXPANDERS WITH PLACEMENT OF BILATERAL BREAST IMPLANTS (Bilateral: Breast) ? ?  ? ?Patient location during evaluation: PACU ?Anesthesia Type: General ?Level of consciousness: awake and alert and oriented ?Pain management: pain level controlled ?Vital Signs Assessment: post-procedure vital signs reviewed and stable ?Respiratory status: spontaneous breathing, nonlabored ventilation and respiratory function stable ?Cardiovascular status: blood pressure returned to baseline and stable ?Postop Assessment: no apparent nausea or vomiting ?Anesthetic complications: no ? ? ?No notable events documented. ? ?Last Vitals:  ?Vitals:  ? 01/01/22 1555 01/01/22 1610  ?BP: 118/68 115/77  ?Pulse:  80  ?Resp:  16  ?Temp:  37.1 ?C  ?SpO2:  100%  ?  ?Last Pain:  ?Vitals:  ? 01/01/22 1610  ?TempSrc:   ?PainSc: 0-No pain  ? ? ?  ?  ?  ?  ?  ?  ? ?Christinea Brizuela A. ? ? ? ? ?

## 2022-01-01 NOTE — Research (Signed)
?  This Nurse has reviewed this patient's inclusion and exclusion criteria as a second review completed on December 29, 2021 and confirms Jenna Cross is eligible for study participation.  Patient may continue with enrollment.  ?Jeral Fruit, RN ?01/01/22 ?10:12 AM ? ?

## 2022-01-01 NOTE — Discharge Instructions (Signed)
INSTRUCTIONS FOR AFTER BREAST SURGERY   You will likely have some questions about what to expect following your operation.  The following information will help you and your family understand what to expect when you are discharged from the hospital.  Following these guidelines will help ensure a smooth recovery and reduce risks of complications.  Postoperative instructions include information on: diet, wound care, medications and physical activity.  AFTER SURGERY Expect to go home after the procedure.  In some cases, you may need to spend one night in the hospital for observation.  DIET Breast surgery does not require a specific diet.  However, the healthier you eat the better your body can start healing. It is important to increasing your protein intake.  This means limiting the foods with sugar and carbohydrates.  Focus on vegetables and some meat.  If you have any liposuction during your procedure be sure to drink water.  If your urine is bright yellow, then it is concentrated, and you need to drink more water.  As a general rule after surgery, you should have 8 ounces of water every hour while awake.  If you find you are persistently nauseated or unable to take in liquids let us know.  NO TOBACCO USE or EXPOSURE.  This will slow your healing process and increase the risk of a wound.  WOUND CARE Leave the ACE wrap or binder on for 3 days . Use fragrance free soap.   After 3 days you can remove the ACE wrap or binder to shower. Once dry apply ACE wrap, binder or sports bra.  Use a mild soap like Dial, Dove and Ivory. You may have Topifoam or Lipofoam on.  It is soft and spongy and helps keep you from getting creases if you have liposuction.  This can be removed before the shower and then replaced.  If you need more it is available on [employer] (Lipofoam). If you have steri-strips / tape directly attached to your skin leave them in place. It is OK to get these wet.   No baths, pools or hot tubs for four  weeks. We close your incision to leave the smallest and best-looking scar. No ointment or creams on your incisions until given the go ahead.  Especially not Neosporin (Too many skin reactions with this one).  A few weeks after surgery you can use Mederma and start massaging the scar. We ask you to wear your binder or sports bra for the first 6 weeks around the clock, including while sleeping. This provides added comfort and helps reduce the fluid accumulation at the surgery site.  ACTIVITY No heavy lifting until cleared by the doctor.  This usually means no more than a half-gallon of milk.  It is OK to walk and climb stairs. In fact, moving your legs is very important to decrease your risk of a blood clot.  It will also help keep you from getting deconditioned.  Every 1 to 2 hours get up and walk for 5 minutes. This will help with a quicker recovery back to normal.  Let pain be your guide so you don't do too much.  This is not the time for spring cleaning and don't plan on taking care of anyone else.  This time is for you to recover,  You will be more comfortable if you sleep and rest with your head elevated either with a few pillows under you or in a recliner.  No stomach sleeping for a three months.  WORK Everyone   returns to work at different times. As a rough guide, most people take at least 1 - 2 weeks off prior to returning to work. If you need documentation for your job, bring the forms to your postoperative follow up visit.  DRIVING Arrange for someone to bring you home from the hospital.  You may be able to drive a few days after surgery but not while taking any narcotics or valium.  BOWEL MOVEMENTS Constipation can occur after anesthesia and while taking pain medication.  It is important to stay ahead for your comfort.  We recommend taking Milk of Magnesia (2 tablespoons; twice a day) while taking the pain pills.  MEDICATIONS You may be prescribed should start after surgery At your  preoperative visit for you history and physical you may have been given the following medications: An antibiotic: Start this medication when you get home and take according to the instructions on the bottle. Zofran 4 mg:  This is to treat nausea and vomiting.  You can take this every 6 hours as needed and only if needed. Valium 2 mg: This is for muscle tightness if you have an implant or expander. This will help relax your muscle which also helps with pain control.  This can be taken every 12 hours as needed. Don't drive after taking this medication. Norco (hydrocodone/acetaminophen) 5/325 mg:  This is only to be used after you have taken the motrin or the tylenol. Every 8 hours as needed.   Over the counter Medication to take: Ibuprofen (Motrin) 600 mg:  Take this every 6 hours.  If you have additional pain then take 500 mg of the tylenol every 8 hours.  Only take the Norco after you have tried these two. Miralax or stool softener of choice: Take this according to the bottle if you take the Norco.  WHEN TO CALL Call your surgeon's office if any of the following occur: Fever 101 degrees F or greater Excessive bleeding or fluid from the incision site. Pain that increases over time without aid from the medications Redness, warmth, or pus draining from incision sites Persistent nausea or inability to take in liquids Severe misshapen area that underwent the operation. 

## 2022-01-01 NOTE — Op Note (Signed)
Op report Bilateral Exchange  ? ?DATE OF OPERATION: 01/01/2022 ? ?LOCATION: [facility] ? ?SURGICAL DIVISION: Plastic Surgery ? ?PREOPERATIVE DIAGNOSIS:  ?1.History of breast cancer.  ?2. Acquired absence of bilateral breast.  ? ?POSTOPERATIVE DIAGNOSIS:  ?1. History of breast cancer.  ?2. Acquired absence of bilateral breast.  ? ?PROCEDURE:  ?1. Bilateral exchange of tissue expanders for implants.  ?2. Bilateral capsulotomies for implant respositioning. ? ?SURGEON: Cletus Paris Sanger Maxten Shuler, DO ? ?ASSISTANT: Roetta Sessions, PA ? ?ANESTHESIA:  General.  ? ?COMPLICATIONS: None.  ? ?IMPLANTS: ?Left - Mentor Smooth Round Ultra High Profile Gel 455cc. Ref #[id number].  Serial Number [id number] ?Right - Mentor Smooth Round Ultra High Profile Gel 455cc. Ref #[id number].  Serial Number [id number] ? ?INDICATIONS FOR PROCEDURE:  ?The patient, Jenna Cross, is a 36 y.o. female born on 09-Jun-1986, is here for treatment after bilateral mastectomies.  She had tissue expanders placed at the time of mastectomies. She now presents for exchange of her expanders for implants.  She requires capsulotomies to better position the implants. ?MRN: [id number] ? ?CONSENT:  ?Informed consent was obtained directly from the patient. Risks, benefits and alternatives were fully discussed. Specific risks including but not limited to bleeding, infection, hematoma, seroma, scarring, pain, implant infection, implant extrusion, capsular contracture, asymmetry, wound healing problems, and need for further surgery were all discussed. The patient did have an ample opportunity to have her questions answered to her satisfaction.  ? ?DESCRIPTION OF PROCEDURE:  ?The patient was taken to the operating room. SCDs were placed and IV antibiotics were given. The patient's chest was prepped and draped in a sterile fashion. A time out was performed and the implants to be used were identified.   ? ?On the right breast: One percent  Lidocaine with epinephrine was used to infiltrate at the incision site. The old mastectomy scar was excised.  The mastectomy flaps from the superior and inferior flaps were raised over the ADM for several centimeters to minimize tension for the closure. The ADM was split at a right angle to the skin incision to expose and remove the tissue expander.  Inspection of the pocket showed a normal healthy capsule and good integration of the biologic matrix.  The pocket was irrigated with antibiotic solution.  Measurements were made and a sizer used to confirm adequate pocket size for the implant dimensions. New gloves were placed. The implant was soaked in antibiotic solution and then placed in the pocket using the Keller funnel and oriented appropriately. The ADM was closed with a 3-0 PDS.  The skin was closed with 3-0 PDS deep dermal and 4-0 Monocryl subcuticular stitches.  ? ?On the left breast: The old mastectomy scar was excised.  The mastectomy flaps from the superior and inferior flaps were raised over the ADM for several centimeters to minimize tension for the closure. The ADM was split at a right angle to the skin incision to expose and remove the tissue expander.  Inspection of the pocket showed a normal healthy capsule and good integration of the biologic matrix.  Measurements were made and a sizer utilized to confirm adequate pocket size for the implant dimensions.  New gloves were applied. The implant was soaked in antibiotic solution and placed in the pocket with the Keller funnel and oriented appropriately. The ADM was closed with a 3-0 PDS. The skin was closed with 3-0 PDS and 4-0 Monocryl subcuticular stitches.  Dermabond was applied to the incision site. A breast binder and ABDs were  placed.  The patient was allowed to wake from anesthesia and taken to the recovery room in satisfactory condition.  ? ?The advanced practice practitioner (APP) assisted throughout the case.  The APP was essential in  retraction and counter traction when needed to make the case progress smoothly.  This retraction and assistance made it possible to see the tissue plans for the procedure.  The assistance was needed for blood control, tissue re-approximation and assisted with closure of the incision site. ? ? ?

## 2022-01-01 NOTE — Telephone Encounter (Signed)
-----   Message from Jenna Gilbert, RN sent at 12/29/2021 12:43 PM EDT ----- ?Regarding: First Time Kadcyla - Dr. Lindi Adie Patient ?First Time Kadcyla - Dr. Lindi Adie Patient ?Patient tolerated treatment well.  ? ?

## 2022-01-01 NOTE — Anesthesia Procedure Notes (Signed)
Procedure Name: LMA Insertion ?Date/Time: 01/01/2022 2:04 PM ?Performed by: Imagene Riches, CRNA ?Pre-anesthesia Checklist: Patient identified, Emergency Drugs available, Suction available and Patient being monitored ?Patient Re-evaluated:Patient Re-evaluated prior to induction ?Oxygen Delivery Method: Circle System Utilized ?Preoxygenation: Pre-oxygenation with 100% oxygen ?Induction Type: IV induction ?Ventilation: Mask ventilation without difficulty ?LMA: LMA inserted ?LMA Size: 4.0 ?Number of attempts: 1 ?Airway Equipment and Method: Bite block ?Placement Confirmation: positive ETCO2 ?Tube secured with: Tape ?Dental Injury: Teeth and Oropharynx as per pre-operative assessment  ? ? ? ? ?

## 2022-01-02 ENCOUNTER — Encounter (HOSPITAL_COMMUNITY): Payer: Self-pay | Admitting: Plastic Surgery

## 2022-01-03 ENCOUNTER — Telehealth: Payer: Self-pay | Admitting: Hematology and Oncology

## 2022-01-03 NOTE — Telephone Encounter (Signed)
Scheduled appointment per 3/24 los. Left message. ?

## 2022-01-05 ENCOUNTER — Encounter: Payer: Self-pay | Admitting: *Deleted

## 2022-01-08 ENCOUNTER — Encounter: Payer: Self-pay | Admitting: Hematology and Oncology

## 2022-01-08 NOTE — Progress Notes (Signed)
36 year old female here for follow-up after removal of bilateral breast tissue expanders and placement of bilateral breast implants with Dr. Marla Roe on 01/01/2022.  She had 455 cc Mentor smooth round ultrahigh profile gel implants placed bilaterally. ? ?She is feeling well, she is very happy with her outcome.  She is very happy with the size.  She does have some irritation of bilateral breast, it is in the distribution of the honeycomb dressing.  She is otherwise feeling well, reports she goes for radiation mapping tomorrow. ? ?Chaperone present on exam ?On exam bilateral breasts are symmetric, no subcutaneous fluid collections.  No cellulitic changes.  She does have a rash on bilateral breast, in the distribution of the honeycomb dressing.  Incisions appear intact.  No ecchymosis noted. ? ? ?Recommend steroid ointment over breast, avoid applying over the incision.  Steri-Strips are still in place anyways. ?Recommend following up in 1 week for reevaluation.  There is no signs of infection on exam.  Recommend calling with questions or concerns. ?Pictures were obtained of the patient and placed in the chart with the patient's or guardian's permission. ? ?

## 2022-01-09 ENCOUNTER — Other Ambulatory Visit: Payer: Self-pay

## 2022-01-09 ENCOUNTER — Ambulatory Visit (INDEPENDENT_AMBULATORY_CARE_PROVIDER_SITE_OTHER): Payer: 59 | Admitting: Surgical

## 2022-01-09 ENCOUNTER — Ambulatory Visit: Payer: 59

## 2022-01-09 DIAGNOSIS — Z170 Estrogen receptor positive status [ER+]: Secondary | ICD-10-CM

## 2022-01-09 DIAGNOSIS — C50411 Malignant neoplasm of upper-outer quadrant of right female breast: Secondary | ICD-10-CM

## 2022-01-09 DIAGNOSIS — Z9889 Other specified postprocedural states: Secondary | ICD-10-CM

## 2022-01-09 MED ORDER — TRIAMCINOLONE ACETONIDE 0.025 % EX OINT: 1.0000 "application " | TOPICAL_OINTMENT | Freq: Two times a day (BID) | CUTANEOUS | 0 refills | Status: DC

## 2022-01-10 ENCOUNTER — Inpatient Hospital Stay: Payer: 59 | Attending: Hematology and Oncology

## 2022-01-10 ENCOUNTER — Ambulatory Visit
Admission: RE | Admit: 2022-01-10 | Discharge: 2022-01-10 | Disposition: A | Discharge status: Home / Self Care | Payer: 59 | Source: Ambulatory Visit | Attending: Radiation Oncology | Admitting: Radiation Oncology

## 2022-01-10 VITALS — BP 116/73 | HR 83 | Temp 98.1°F | Resp 16

## 2022-01-10 DIAGNOSIS — Z170 Estrogen receptor positive status [ER+]: Secondary | ICD-10-CM

## 2022-01-10 DIAGNOSIS — C50411 Malignant neoplasm of upper-outer quadrant of right female breast: Secondary | ICD-10-CM | POA: Insufficient documentation

## 2022-01-10 DIAGNOSIS — Z5111 Encounter for antineoplastic chemotherapy: Secondary | ICD-10-CM | POA: Diagnosis present

## 2022-01-10 DIAGNOSIS — Z79899 Other long term (current) drug therapy: Secondary | ICD-10-CM | POA: Diagnosis not present

## 2022-01-10 MED ORDER — GOSERELIN ACETATE 3.6 MG ~~LOC~~ IMPL
3.6000 mg | DRUG_IMPLANT | Freq: Once | SUBCUTANEOUS | Status: AC
  Administered 2022-01-10: 3.6 mg via SUBCUTANEOUS
  Filled 2022-01-10: qty 3.6

## 2022-01-10 NOTE — Progress Notes (Incomplete)
? ?Patient Care Team: ?Dian Queen, MD as PCP - General (Obstetrics and Gynecology) ?Dian Queen, MD (Obstetrics and Gynecology) ?Mauro Kaufmann, RN as Oncology Nurse Navigator ?Rockwell Germany, RN as Oncology Nurse Navigator ?Nicholas Lose, MD as Consulting Physician (Hematology and Oncology) ?Gery Pray, MD as Consulting Physician (Radiation Oncology) ?Rolm Bookbinder, MD as Consulting Physician (General Surgery) ? ?DIAGNOSIS: No diagnosis found. ? ?SUMMARY OF ONCOLOGIC HISTORY: ?Oncology History  ?Malignant neoplasm of upper-outer quadrant of right breast in female, estrogen receptor positive ([place])  ?06/23/2021 Initial Diagnosis  ? Palpable lump in the right breast. Diagnostic mammogram and [place]: a 3.8 cm mass at the retroareolar right breast, 1 prominent right axillary lymph node with a 4 mm cortex, 1 other which is borderline measuring 3 mm. 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  3 o'clock position: Biopsy fibroadenoma ?  ?07/12/2021 -  Neo-Adjuvant Chemotherapy  ? Neoadjuvant chemotherapy with [place] Perjeta ?6 cycles followed by Herceptin Perjeta maintenance versus Kadcyla maintenance (based on response to neoadjuvant chemo) for 1 year ?  ?07/28/2021 Genetic Testing  ? Negative hereditary cancer genetic testing: no pathogenic variants detected in [facility] CancerNext-Expanded +RNAinsight Panel.  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  The report date is July 28, 2021.  ? ?The CancerNext-Expanded gene panel offered by [facility] and includes sequencing, rearrangement, and RNA analysis for the following 77 genes: AIP, ALK, APC, ATM, AXIN2, BAP1, BARD1, BLM, BMPR1A, BRCA1, BRCA2, BRIP1, CDC73, CDH1, CDK4, CDKN1B, CDKN2A, CHEK2, CTNNA1, DICER1, FANCC, FH, FLCN, GALNT12, KIF1B, LZTR1, MAX, MEN1, MET, MLH1, MSH2, MSH3, MSH6, MUTYH, NBN, NF1, NF2, NTHL1, PALB2, PHOX2B, PMS2, POT1,  PRKAR1A, PTCH1, PTEN, RAD51C, RAD51D, RB1, RECQL, RET, SDHA, SDHAF2, SDHB, SDHC, SDHD, SMAD4, SMARCA4, SMARCB1, SMARCE1, STK11, SUFU, TMEM127, TP53, TSC1, TSC2, VHL and XRCC2 (sequencing and deletion/duplication); EGFR, EGLN1, HOXB13, KIT, MITF, PDGFRA, POLD1, and POLE (sequencing only); EPCAM and GREM1 (deletion/duplication only).  ?  ?12/06/2021 Surgery  ? Bilateral Mastectomies: ?Left mastectomy: Benign ?Right mastectomy: Multifocal ILC 1.8 cm, 0.9 cm, margins negative, 1/2 lymph nodes positive, ER 50%, PR 30%, HER2 1+ IHC ?  ?12/26/2021 -  Chemotherapy  ? Patient is on Treatment Plan : BREAST ADO-Trastuzumab Emtansine (Kadcyla) q21d  ?   ? ? ?CHIEF COMPLIANT: Follow-up of right breast cancer  ? ?INTERVAL HISTORY: Jenna Cross is a  36 y.o. with above-mentioned history of right breast cancer, currently on chemotherapy with TCHP. She presents to the clinic today for follow-up.  ? ? ?ALLERGIES:  has No Known Allergies. ? ?MEDICATIONS:  ?Current Outpatient Medications  ?Medication Sig Dispense Refill  ? acetaminophen (TYLENOL) 500 MG tablet Take 1,000 mg by mouth every 6 (six) hours as needed for mild pain or headache.    ? ALPRAZolam (XANAX) 0.5 MG tablet Take 0.5 mg by mouth 3 (three) times daily as needed for anxiety.    ? diphenoxylate-atropine (LOMOTIL) 2.5-0.025 MG tablet Take 1 tablet by mouth 3 (three) times daily as needed for diarrhea or loose stools. 30 tablet 3  ? enoxaparin (LOVENOX) 60 MG/0.6ML injection Inject 0.6 mLs (60 mg total) into the skin every 12 (twelve) hours. 6 mL 0  ? escitalopram (LEXAPRO) 20 MG tablet Take 20 mg by mouth every evening.    ? lidocaine-prilocaine (EMLA) cream Apply to affected area once (Patient taking differently: Apply 1 application. topically as needed (for [facility] access). Apply to affected area once) 30 g 3  ? loratadine (CLARITIN) 10 MG  tablet Take 10 mg by mouth daily as needed for allergies.    ? magic mouthwash w/lidocaine SOLN Take 5 mLs by mouth 3 (three) times  daily as needed for mouth pain. 100 mL 0  ? rivaroxaban (XARELTO) 20 MG TABS tablet Take 1 tablet (20 mg total) by mouth daily with supper. 30 tablet 5  ? triamcinolone (KENALOG) 0.025 % ointment Apply 1 application. topically 2 (two) times daily. 30 g 0  ? valACYclovir (VALTREX) 500 MG tablet Take 500 mg by mouth every evening.    ? ?No current facility-administered medications for this visit.  ? ? ?PHYSICAL EXAMINATION: ?ECOG PERFORMANCE STATUS: {CHL ONC ECOG RK:[id number]} ? ?There were no vitals filed for this visit. ?There were no vitals filed for this visit. ? ?BREAST:*** No palpable masses or nodules in either right or left breasts. No palpable axillary supraclavicular or infraclavicular adenopathy no breast tenderness or nipple discharge. (exam performed in the presence of a chaperone) ? ?LABORATORY DATA:  ?I have reviewed the data as listed ? ?  Latest Ref Rng & Units 12/29/2021  ?  8:55 AM 11/16/2021  ?  8:10 AM 10/26/2021  ?  8:30 AM  ?CMP  ?Glucose 70 - 99 mg/dL 93   89   90    ?BUN 6 - 20 mg/dL _0 ?Creatinine 0.44 - 1.00 mg/dL 0.75   0.63   0.57    ?Sodium 135 - 145 mmol/L 141   141   140    ?Potassium 3.5 - 5.1 mmol/L 4.0   3.8   3.4    ?Chloride 98 - 111 mmol/L 107   110   108    ?CO2 22 - 32 mmol/L _1 ?Calcium 8.9 - 10.3 mg/dL 9.2   8.6   9.5    ?Total Protein 6.5 - 8.1 g/dL 6.4   6.1   6.5    ?Total Bilirubin 0.3 - 1.2 mg/dL 0.3   0.4   0.4    ?Alkaline Phos 38 - 126 U/L 99   92   96    ?AST 15 - 41 U/L _2 ?ALT 0 - 44 U/L _3 ? ? ?Lab Results  ?Component Value Date  ? WBC 5.9 12/29/2021  ? HGB 12.3 12/29/2021  ? HCT 37.2 12/29/2021  ? MCV 98.9 12/29/2021  ? PLT 206 12/29/2021  ? NEUTROABS 2.7 12/29/2021  ? ? ?ASSESSMENT & PLAN:  ?No problem-specific Assessment & Plan notes found for this encounter. ? ? ? ?No orders of the defined types were placed in this encounter. ? ?The patient has a good understanding of the overall plan. she agrees with it. she will  call with any problems that may develop before the next visit here. ?Total time spent: 30 mins including face to face time and time spent for planning, charting and co-ordination of care ? ? Suzzette Righter, CMA ?01/10/22 ? ? ? I Aniah Pauli, Almon Whitford am scribing for Dr. Lindi Adie ? ?***  ?

## 2022-01-11 ENCOUNTER — Ambulatory Visit: Payer: 59 | Attending: Hematology and Oncology

## 2022-01-11 DIAGNOSIS — R293 Abnormal posture: Secondary | ICD-10-CM | POA: Insufficient documentation

## 2022-01-11 DIAGNOSIS — Z170 Estrogen receptor positive status [ER+]: Secondary | ICD-10-CM | POA: Insufficient documentation

## 2022-01-11 DIAGNOSIS — C50411 Malignant neoplasm of upper-outer quadrant of right female breast: Secondary | ICD-10-CM | POA: Diagnosis present

## 2022-01-11 NOTE — Therapy (Signed)
[place] ?[place] [facility] [place] ?[place], [place], [address] ?Phone: [phone]   Fax:  [phone] ? ?Physical Therapy Treatment ? ?Patient Details  ?Name: Jenna Cross ?MRN: [id number] ?Date of Birth: 1986/06/18 ?Referring Provider (PT): Dr. Donne Hazel ? ? ?Encounter Date: 01/11/2022 ? ? PT End of Session - 01/11/22 1651   ? ? Visit Number 2   ? Number of Visits 2   ? Date for PT Re-Evaluation 01/12/22   ? PT Start Time 3893   ? PT Stop Time 1640   ? PT Time Calculation (min) 36 min   ? Activity Tolerance Patient tolerated treatment well   ? Behavior During Therapy [facility] for tasks assessed/performed   ? ?  ?  ? ?  ? ? ?Past Medical History:  ?Diagnosis Date  ? Anemia   ? pregnancy related  ? Anxiety   ? Anxiety   ? no medication  ? Cancer [facility])   ? breast  ? COVID 09/2020  ? mild case  ? Family history of prostate cancer 07/17/2021  ? History of DVT (deep vein thrombosis)   ? in her neck  ? ? ?Past Surgical History:  ?Procedure Laterality Date  ? BREAST RECONSTRUCTION WITH PLACEMENT OF TISSUE EXPANDER AND FLEX HD (ACELLULAR HYDRATED DERMIS) Bilateral 12/06/2021  ? Procedure: BREAST RECONSTRUCTION WITH PLACEMENT OF TISSUE EXPANDER AND FLEX HD (ACELLULAR HYDRATED DERMIS);  Surgeon: Wallace Going, DO;  Location: [place];  Service: Plastics;  Laterality: Bilateral;  ? BREAST SURGERY    ? cyst removal from right breast   ? CESAREAN SECTION N/A 01/31/2021  ? Procedure: CESAREAN SECTION;  Surgeon: Dian Queen, MD;  Location: [place] [facility];  Service: Obstetrics;  Laterality: N/A;  ? excision of fibroadenoma of breast    ? PORTACATH PLACEMENT N/A 07/11/2021  ? Procedure: INSERTION PORT-A-CATH;  Surgeon: Rolm Bookbinder, MD;  Location: [place];  Service: General;  Laterality: N/A;  ? RADIOACTIVE SEED GUIDED AXILLARY SENTINEL LYMPH NODE Right 12/06/2021  ? Procedure: RADIOACTIVE SEED GUIDED RIGHT AXILLARY SENTINEL LYMPH NODE  EXCISION;  Surgeon: Rolm Bookbinder, MD;  Location: [place];  Service: General;  Laterality: Right;  ? REMOVAL OF BILATERAL TISSUE EXPANDERS WITH PLACEMENT OF BILATERAL BREAST IMPLANTS Bilateral 01/01/2022  ? Procedure: REMOVAL OF BILATERAL TISSUE EXPANDERS WITH PLACEMENT OF BILATERAL BREAST IMPLANTS;  Surgeon: Wallace Going, DO;  Location: [place];  Service: Plastics;  Laterality: Bilateral;  ? SENTINEL NODE BIOPSY Right 12/06/2021  ? Procedure: RIGHT AXILLARY SENTINEL NODE BIOPSY;  Surgeon: Rolm Bookbinder, MD;  Location: [place];  Service: General;  Laterality: Right;  ? TONSILLECTOMY    ? and adenoids  ? TONSILLECTOMY AND ADENOIDECTOMY    ? TOTAL MASTECTOMY Bilateral 12/06/2021  ? Procedure: BILATERAL SKIN SPARING TOTAL MASTECTOMY;  Surgeon: Rolm Bookbinder, MD;  Location: [place];  Service: General;  Laterality: Bilateral;  ? WISDOM TOOTH EXTRACTION    ? ? ?There were no vitals filed for this visit. ? ? Subjective Assessment - 01/11/22 1604   ? ? Subjective Pt is here for post surgical re-assessment. She is s/p Bilateral skin sparing mastecomy on 12/06/2021 with immediate expanders, and Removal of tissue expanders with implant placement on 01/01/2022. She had CT simulation yesterday. The incisions are healing well, but I have had some skin irritation and they checked it out. Radiation will start on April 17th.   ? 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  She is having neoadjuvant chemotherapay x 6 cycles which started on 07/12/2021. She is pending a bilateral mastectomy with SLNB with reconstruction, radiation and antiestrogen therapy.  She has 2 children: 5 mos and 18 mos. She does not yet have a surgery date   ? Currently in Pain? No/denies   feel very numb in right armpit and both breasts.  ? Pain Score 0-No pain   ? ?  ?  ? ?  ? ? ? ? ? [facility] PT Assessment - 01/11/22 0001   ? ?  ? Assessment  ? Medical  Diagnosis Right breast Cancer   ? Referring Provider (PT) Dr. Donne Hazel   ? Hand Dominance Right   ?  ? Precautions  ? Precaution Comments Right lymphedema risk   ?  ? Prior Function  ? Level of Independence Independent   ?  ? Observation/Other Assessments  ? Observations porta cath still present on left   ? Skin Integrity Steri strips present over breast incisions. Skin irritation noted around steri strips but she has addressed this with MD. No visible swelling   ?  ? AROM  ? Right Shoulder Extension 59 Degrees   ? Right Shoulder Flexion 154 Degrees   ? Right Shoulder ABduction 172 Degrees   ? Right Shoulder Internal Rotation 75 Degrees   ? Right Shoulder External Rotation 107 Degrees   ? Left Shoulder Extension 61 Degrees   ? Left Shoulder Flexion 160 Degrees   ? Left Shoulder ABduction 180 Degrees   ? Left Shoulder Internal Rotation 65 Degrees   ? Left Shoulder External Rotation 108 Degrees   ? ?  ?  ? ?  ? ? ? ? LYMPHEDEMA/ONCOLOGY QUESTIONNAIRE - 01/11/22 0001   ? ?  ? Treatment  ? Active Radiation Treatment --   starting 01/22/2022  ? Past Radiation Treatment No   ? Current Hormone Treatment --   pending  ?  ? What other symptoms do you have  ? Are you Having Heaviness or Tightness No   ? Are you having Pain No   ? Are you having pitting edema No   ? Is it Hard or Difficult finding clothes that fit No   ? Do you have infections No   ? Is there Decreased scar mobility No   ? Stemmer Sign No   ?  ? Right Upper Extremity Lymphedema  ? 10 cm Proximal to Olecranon Process 26.3 cm   ? Olecranon Process 23 cm   ? 10 cm Proximal to Ulnar Styloid Process 19.4 cm   ? Across Hand at [employer] 14.2 cm   ? At [facility] of 2nd Digit 5.6 cm   ?  ? Left Upper Extremity Lymphedema  ? 10 cm Proximal to Olecranon Process 26.5 cm   ? Olecranon Process 23 cm   ? 10 cm Proximal to Ulnar Styloid Process 18.3 cm   ? Just Proximal to Ulnar Styloid Process 14.3 cm   ? At [facility] of 2nd Digit 5.3 cm   ? ?  ?  ? ?  ? ? ? ? ? Katina Dung -  01/11/22 0001   ? ? Open a tight or new jar No difficulty   ? Do heavy household chores (wash walls, wash floors) No difficulty   ? Carry a shopping bag or briefcase No difficulty   ? Wash your back No difficulty   ? Use a knife to cut  food No difficulty   ? Recreational activities in which you take some force or impact through your arm, shoulder, or hand (golf, hammering, tennis) No difficulty   ? During the past week, to what extent has your arm, shoulder or hand problem interfered with your normal social activities with family, friends, neighbors, or groups? Not at all   ? During the past week, to what extent has your arm, shoulder or hand problem limited your work or other regular daily activities Not at all   ? Arm, shoulder, or hand pain. None   ? Tingling (pins and needles) in your arm, shoulder, or hand None   ? Difficulty Sleeping No difficulty   ? DASH Score 0 %   ? ?  ?  ? ?  ? ? ? ? ? ? ? ? ? ? ? ? ? ? ? ? ? ? ? ? ? ? ? PT Long Term Goals - 01/11/22 1649   ? ?  ? PT LONG TERM GOAL #1  ? Title Pts post surgical bilateral shoulder  ROM will be within 5 degrees of pre surgical ROM   ? Baseline lacking 6 degrees flex, 7 degrees abd on right  ? Time 6   ? Period Months   ? Status Partially Met   ? Target Date 01/11/22   ?  ? PT LONG TERM GOAL #2  ? Title Pt will be set up for ABC class   ? Time 6   ? Period Months   ? Status Achieved   ? Target Date 01/11/22   ? ?  ?  ? ?  ? ? ? ? ? ? ? ? Plan - 01/11/22 1643   ? ? Clinical Impression Statement Pt was here for post surgical assessment.  She had expanders removed and implants placed on 01/01/22.  She will start radiation on April 17th.  Shoulder ROM is progressing very well and she is lacking only mild end ranges on right. She has been advised by Plastic surgeon not to do anything too vigorous for 2 weeks.  There is no significant difference in arm circumference.  pt was set up for ABC class and her next SOZO screen.  Pt is progressing especially well and there  are no further needs identified from a PT standpoint. She was advised to email or call with questions or concerns. She is discharged.   ? Stability/Clinical Decision Making Stable/Uncomplicated   ? Clinical [place]

## 2022-01-11 NOTE — Patient Instructions (Addendum)
[facility]  [address], [address]  [place] [address]  [phone]  After Breast Cancer Class It is recommended you attend the ABC class to be educated on lymphedema risk reduction. This class is free of charge and lasts for 1 hour. It is a 1-time class. You will need to download the Webex app either on your phone or computer. We will send you a link the night before or the morning of the class. You should be able to click on that link to join the class. This is not a confidential class. You don't have to turn your camera on, but other participants may be able to see your email address.  Scar massage You can begin gentle scar massage to you incision sites. Gently place one hand on the incision and move the skin (without sliding on the skin) in various directions. Do this for a few minutes and then you can gently massage either coconut oil or vitamin E cream into the scars.  Compression garment You should continue wearing your compression bra until you feel like you no longer have swelling.  Home exercise Program Continue doing the exercises you were given until you feel like you can do them without feeling any tightness at the end.   Walking Program Studies show that 30 minutes of walking per day (fast enough to elevate your heart rate) can significantly reduce the risk of a cancer recurrence. If you can't walk due to other medical reasons, we encourage you to find another activity you could do (like a stationary bike or water exercise).  Posture After breast cancer surgery, people frequently sit with rounded shoulders posture because it puts their incisions on slack and feels better. If you sit like this and scar tissue forms in that position, you can become very tight and have pain sitting or standing with good posture. Try to be aware of your posture and sit and stand up tall to heal properly.  Follow up PT: It is recommended you return every 3 months for the  first 3 years following surgery to be assessed on the SOZO machine for an L-Dex score. This helps prevent clinically significant lymphedema in 95% of patients. These follow up screens are 10 minute appointments that you are not billed for. 

## 2022-01-15 ENCOUNTER — Inpatient Hospital Stay: Payer: 59

## 2022-01-15 ENCOUNTER — Other Ambulatory Visit: Payer: Self-pay

## 2022-01-15 DIAGNOSIS — Z170 Estrogen receptor positive status [ER+]: Secondary | ICD-10-CM

## 2022-01-16 ENCOUNTER — Encounter: Payer: Self-pay | Admitting: Radiation Oncology

## 2022-01-16 ENCOUNTER — Other Ambulatory Visit: Payer: Self-pay

## 2022-01-16 ENCOUNTER — Encounter: Payer: Self-pay | Admitting: *Deleted

## 2022-01-16 ENCOUNTER — Inpatient Hospital Stay: Payer: 59

## 2022-01-16 DIAGNOSIS — C50411 Malignant neoplasm of upper-outer quadrant of right female breast: Secondary | ICD-10-CM

## 2022-01-16 DIAGNOSIS — Z5111 Encounter for antineoplastic chemotherapy: Secondary | ICD-10-CM | POA: Diagnosis not present

## 2022-01-16 LAB — HCG, SERUM, QUALITATIVE: Preg, Serum: NEGATIVE

## 2022-01-16 NOTE — Research (Signed)
CompassHER2 Residual Disease, A Double-Blinded, Phase III Randomized Trial of T-DM1 and Placebo Compared with T-DMI and Tucatinib: ? ?RANDOMIZATION: Patient Jenna Cross is randomized to study, which is double-blinded.  ? ?Stratification criteria (post-operative chemotherapy: no -- per protocol section 4.6.1, 1 cycle of adjuvant TDM-1 is permitted and does not count as post-operative treatment; hormone-receptor status: positive; and pathologic lymph node status: positive) were confirmed by this clinical research nurse and verified by clinical research nurse Jeral Fruit.  ? ?As part of the assigned treatment, patient Jenna Cross will receive T-DM1 + placebo/tucatinib treatment. MD and Investigational Drug Service will be notified of patient randomization; treatment will begin Monday, 01/22/2022. Per study protocol, patient Jenna Cross will not be notified of their treatment assignment. Patient Jenna Cross is successfully enrolled in the above study. ? ?Vickii Penna, RN, BSN, CPN ?Clinical Research Nurse I ?[phone] ? ?01/16/2022 4:01 PM ? ?

## 2022-01-16 NOTE — Research (Signed)
?  This Nurse has reviewed this patient's inclusion and exclusion criteria as a second review prior to her enrollment today and confirms Jenna Cross is eligible for study participation.  Patient may continue with enrollment.  ?Jeral Fruit, RN ?01/16/22 ?3:44 PM ? ? ?

## 2022-01-16 NOTE — Research (Signed)
CompassHER2 Residual Disease, A Double-Blinded, Phase III Randomized Trial of T-DM1 and Placebo Compared with T-DMI and Tucatinib: ? ?REGISTRATION: Patient Jenna Cross, was registered to the above listed study, assigned study #[id number]. Randomization is required for this study. Randomization information to follow.  ? ?Vickii Penna, RN, BSN, CPN ?Clinical Research Nurse I ?[phone] ? ?01/16/2022 3:59 PM ? ? ?

## 2022-01-16 NOTE — Research (Signed)
CompassHER2 Residual Disease, A Double-Blinded, Phase III Randomized Trial of T-DM1 and Placebo Compared with T-DMI and Tucatinib: ? ?ELIGIBILITY: Patient presented to clinic unaccompanied this afternoon for final eligibility assessment (a serum pregnancy test). Once resulted, this nurse reviewed this patient's inclusion and exclusion criteria and confirmed Jenna Cross is eligible for study participation. Patient will continue with enrollment. ? ?Menopausal status (women only): Jenna Cross is pre-menopausal; she utilizes goserelin for ovarian suppression. Serum pregnancy test on 01/16/22 was negative. The importance of avoiding pregnancy was discussed with Jenna Cross. Patient verbalizes understanding. ? ?Other eligibility criteria reviewed. Per protocol, patient is to receive comprehensive nodal radiation due to her residual node-positive disease; confirmed with MD that this is part of patient's rad-onc treatment plan. Exclusion criteria reviewed with patient, she denies all. Two of patient's concomitant medications -- alprazolam and rivaroxaban -- are moderately-sensitive CYP3A substrates. Confirmed with study that these medications are not exclusionary to participation; Dr Lindi Adie aware. ? ?Patient's referring provider, Dr Lindi Adie, is out of the office. Dr Burr Medico, a sub-investigator, reviewed the patient's eligibility criteria in his absence and has agreed that patient should proceed with enrollment. ? ?PLAN: Patient agreed to return to clinic on Friday, 4/14, to collect back-up paper PROs and set up [name]. C1D1 will be Monday, 4/17, and will include a port flush with labs, an MD visit, and an infusion. Patient thanked for her time and continued voluntary participation in this study. Patient has been provided with direct contact information and is encouraged to contact this nurse for any questions or concerns.  ? ?Jenna Penna, RN, BSN, CPN ?Clinical Research Nurse  I ?[phone] ? ?01/16/2022 3:43 PM ? ? ? ? ?

## 2022-01-17 ENCOUNTER — Ambulatory Visit: Payer: 59

## 2022-01-18 ENCOUNTER — Encounter: Payer: Self-pay | Admitting: Surgical

## 2022-01-18 ENCOUNTER — Telehealth: Payer: Self-pay | Admitting: Plastic Surgery

## 2022-01-18 ENCOUNTER — Telehealth: Payer: Self-pay | Admitting: *Deleted

## 2022-01-18 ENCOUNTER — Other Ambulatory Visit: Payer: Self-pay

## 2022-01-18 ENCOUNTER — Encounter: Payer: Self-pay | Admitting: Plastic Surgery

## 2022-01-18 ENCOUNTER — Ambulatory Visit: Payer: 59

## 2022-01-18 DIAGNOSIS — Z170 Estrogen receptor positive status [ER+]: Secondary | ICD-10-CM

## 2022-01-18 DIAGNOSIS — Z5111 Encounter for antineoplastic chemotherapy: Secondary | ICD-10-CM | POA: Diagnosis not present

## 2022-01-18 MED ORDER — METHOCARBAMOL 500 MG PO TABS: 500.0000 mg | ORAL_TABLET | Freq: Two times a day (BID) | ORAL | 0 refills | Status: AC | PRN

## 2022-01-18 MED ORDER — SULFAMETHOXAZOLE-TRIMETHOPRIM 800-160 MG PO TABS: 1.0000 | ORAL_TABLET | Freq: Two times a day (BID) | ORAL | 0 refills | Status: DC

## 2022-01-18 NOTE — Telephone Encounter (Signed)
Tried to reach patient. NA/NVM - MKM ?

## 2022-01-18 NOTE — Telephone Encounter (Signed)
Patient called to request advice regarding an urgent matter. She states that she believes that she has developed an infection of her BL breast sx wound. She wants to see about getting medication orders and possibly to send over pictures of the wound to staff. I did advise her that she has an appointment tomorrow with Rodman Key to check her rash. She feels that this needs attention today. Could someone call her and see what can be done? ?

## 2022-01-18 NOTE — Telephone Encounter (Signed)
Pt called with fever of 103. [facility] and felt dizzy and developed a HA with chills. ?Pt relate incision looks well, steri strips did come off a little earlier. Scheduled to see PA tomorrow for post op ?Msg sent to Dr. Marla Roe and PA for review.  ?

## 2022-01-19 ENCOUNTER — Ambulatory Visit (INDEPENDENT_AMBULATORY_CARE_PROVIDER_SITE_OTHER): Payer: 59 | Admitting: Surgical

## 2022-01-19 ENCOUNTER — Inpatient Hospital Stay: Payer: 59

## 2022-01-19 ENCOUNTER — Ambulatory Visit: Payer: 59

## 2022-01-19 ENCOUNTER — Encounter: Payer: 59 | Admitting: Plastic Surgery

## 2022-01-19 DIAGNOSIS — Z9889 Other specified postprocedural states: Secondary | ICD-10-CM

## 2022-01-19 DIAGNOSIS — Z170 Estrogen receptor positive status [ER+]: Secondary | ICD-10-CM

## 2022-01-19 DIAGNOSIS — C50411 Malignant neoplasm of upper-outer quadrant of right female breast: Secondary | ICD-10-CM

## 2022-01-19 NOTE — Progress Notes (Signed)
Patient is a 36 year old female here for follow-up after removal of bilateral breast tissue expanders and placement of bilateral breast implants on 01/01/2022.  Her implants are prepectoral. ? ?At her initial postop appointment she had some irritation and a rash from the honeycomb dressing.  Patient sent a MyChart message yesterday to notify that the bilateral breast have became more irritated and the right is more red.  She was started on Bactrim for prophylactic coverage given the implants in place. ? ?She presents today with her husband.  She reports that she had a fever yesterday.  She noticed some increased redness while on the phone with her nurse navigator and was instructed to call us.  She reports she is feeling somewhat fatigued, but reports she may have been overextending herself with taking care of her children and doing other extracurricular activities.   ? ?She reports she is scheduled to start radiation on Monday.  She also has a chemoinfusion scheduled Monday. ? ?Chaperone present on exam ?On exam left breast incision is intact, healing well, some hyperpigmentation noted in the distribution of the Steri-Strips.  No subcutaneous fluid collection noted of either breast. ? ?On exam of the right breast, Steri-Strips are in place along the medial aspect.  This was removed.  There is some epithelium loss due to the Steri-Strip and resulting irritation.  She also has redness of the right breast, it is not cellulitic.  There is some mildly increased tenderness along the inferior breast.  No active drainage from the incision noted.  Incision is intact. ? ?Plan: ? ?Recommend continuing with Bactrim as prescribed yesterday.   ?Discussed continued monitoring of the right breast for increased redness, skin changes, increased tenderness, infectious symptoms.  Discussed with patient to call on-call provider this weekend if she notices any changes. ? ?Plan for televisit on 01/22/2022 with Dr. Marla Roe to discuss and  reevaluate. ?Recommend postponing radiation as she now has a medial superficial breast wound due to the Steri-Strip.  May also benefit from holding hemotherapy treatment for next week pending oncology approval.  Will reach out to oncology and radiation team. ? ?Pictures were obtained of the patient and placed in the chart with the patient's or guardian's permission. ? ?

## 2022-01-19 NOTE — Telephone Encounter (Signed)
Pt seen in office today.

## 2022-01-19 NOTE — Research (Signed)
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 ?Patient had follow-up appointment with plastic surgery today (see clinic note dated 01/19/22). It was recommended that patient delay starting radiation and chemotherapy until surgical site is better healed. Spoke with nurse navigator; confirmed delay. Scheduling message sent to move patient appointments back one week, with C1D1 on 01/29/22 instead of 01/22/22. ? ?Vickii Penna, RN, BSN, CPN ?Clinical Research Nurse I ?[phone] ? ?01/19/2022 3:30 PM ? ?

## 2022-01-22 ENCOUNTER — Encounter: Payer: Self-pay | Admitting: Plastic Surgery

## 2022-01-22 ENCOUNTER — Inpatient Hospital Stay: Payer: 59

## 2022-01-22 ENCOUNTER — Inpatient Hospital Stay (HOSPITAL_COMMUNITY): Payer: 59

## 2022-01-22 ENCOUNTER — Encounter (HOSPITAL_BASED_OUTPATIENT_CLINIC_OR_DEPARTMENT_OTHER): Payer: Self-pay | Admitting: Emergency Medicine

## 2022-01-22 ENCOUNTER — Encounter: Payer: Self-pay | Admitting: Hematology and Oncology

## 2022-01-22 ENCOUNTER — Telehealth: Payer: Self-pay | Admitting: Internal Medicine

## 2022-01-22 ENCOUNTER — Emergency Department (HOSPITAL_BASED_OUTPATIENT_CLINIC_OR_DEPARTMENT_OTHER): Payer: 59

## 2022-01-22 ENCOUNTER — Other Ambulatory Visit: Payer: Self-pay

## 2022-01-22 ENCOUNTER — Ambulatory Visit (INDEPENDENT_AMBULATORY_CARE_PROVIDER_SITE_OTHER): Payer: 59 | Admitting: Plastic Surgery

## 2022-01-22 ENCOUNTER — Ambulatory Visit: Payer: 59 | Admitting: Radiation Oncology

## 2022-01-22 ENCOUNTER — Inpatient Hospital Stay: Payer: 59 | Admitting: Hematology and Oncology

## 2022-01-22 ENCOUNTER — Inpatient Hospital Stay (HOSPITAL_BASED_OUTPATIENT_CLINIC_OR_DEPARTMENT_OTHER)
Admission: EM | Admit: 2022-01-22 | Discharge: 2022-01-26 | DRG: 584 | Disposition: A | Discharge status: Home / Self Care | Payer: 59 | Source: Ambulatory Visit | Attending: Plastic Surgery | Admitting: Plastic Surgery

## 2022-01-22 DIAGNOSIS — S20121A Blister (nonthermal) of breast, right breast, initial encounter: Secondary | ICD-10-CM | POA: Diagnosis present

## 2022-01-22 DIAGNOSIS — N611 Abscess of the breast and nipple: Secondary | ICD-10-CM | POA: Diagnosis present

## 2022-01-22 DIAGNOSIS — D630 Anemia in neoplastic disease: Secondary | ICD-10-CM | POA: Diagnosis present

## 2022-01-22 DIAGNOSIS — F419 Anxiety disorder, unspecified: Secondary | ICD-10-CM | POA: Diagnosis present

## 2022-01-22 DIAGNOSIS — Z7901 Long term (current) use of anticoagulants: Secondary | ICD-10-CM | POA: Diagnosis not present

## 2022-01-22 DIAGNOSIS — Z86718 Personal history of other venous thrombosis and embolism: Secondary | ICD-10-CM | POA: Diagnosis not present

## 2022-01-22 DIAGNOSIS — Z9013 Acquired absence of bilateral breasts and nipples: Secondary | ICD-10-CM | POA: Diagnosis not present

## 2022-01-22 DIAGNOSIS — Z8042 Family history of malignant neoplasm of prostate: Secondary | ICD-10-CM

## 2022-01-22 DIAGNOSIS — T451X5A Adverse effect of antineoplastic and immunosuppressive drugs, initial encounter: Secondary | ICD-10-CM | POA: Diagnosis present

## 2022-01-22 DIAGNOSIS — E876 Hypokalemia: Secondary | ICD-10-CM | POA: Diagnosis present

## 2022-01-22 DIAGNOSIS — Z9882 Breast implant status: Secondary | ICD-10-CM | POA: Diagnosis not present

## 2022-01-22 DIAGNOSIS — Z170 Estrogen receptor positive status [ER+]: Secondary | ICD-10-CM | POA: Diagnosis not present

## 2022-01-22 DIAGNOSIS — C50411 Malignant neoplasm of upper-outer quadrant of right female breast: Secondary | ICD-10-CM | POA: Diagnosis present

## 2022-01-22 DIAGNOSIS — C50919 Malignant neoplasm of unspecified site of unspecified female breast: Secondary | ICD-10-CM | POA: Diagnosis present

## 2022-01-22 DIAGNOSIS — Z807 Family history of other malignant neoplasms of lymphoid, hematopoietic and related tissues: Secondary | ICD-10-CM

## 2022-01-22 DIAGNOSIS — D84821 Immunodeficiency due to drugs: Secondary | ICD-10-CM | POA: Diagnosis present

## 2022-01-22 DIAGNOSIS — Z20828 Contact with and (suspected) exposure to other viral communicable diseases: Secondary | ICD-10-CM | POA: Diagnosis present

## 2022-01-22 DIAGNOSIS — Z8616 Personal history of COVID-19: Secondary | ICD-10-CM

## 2022-01-22 DIAGNOSIS — T8579XA Infection and inflammatory reaction due to other internal prosthetic devices, implants and grafts, initial encounter: Secondary | ICD-10-CM | POA: Diagnosis not present

## 2022-01-22 DIAGNOSIS — Z9221 Personal history of antineoplastic chemotherapy: Secondary | ICD-10-CM | POA: Diagnosis not present

## 2022-01-22 DIAGNOSIS — K5900 Constipation, unspecified: Secondary | ICD-10-CM | POA: Diagnosis present

## 2022-01-22 DIAGNOSIS — B9561 Methicillin susceptible Staphylococcus aureus infection as the cause of diseases classified elsewhere: Secondary | ICD-10-CM | POA: Diagnosis present

## 2022-01-22 DIAGNOSIS — R7982 Elevated C-reactive protein (CRP): Secondary | ICD-10-CM | POA: Diagnosis present

## 2022-01-22 DIAGNOSIS — Z79899 Other long term (current) drug therapy: Secondary | ICD-10-CM

## 2022-01-22 DIAGNOSIS — N61 Mastitis without abscess: Secondary | ICD-10-CM | POA: Diagnosis present

## 2022-01-22 DIAGNOSIS — L0390 Cellulitis, unspecified: Principal | ICD-10-CM

## 2022-01-22 LAB — COMPREHENSIVE METABOLIC PANEL
ALT: 12 U/L (ref 0–44)
AST: 11 U/L — ABNORMAL LOW (ref 15–41)
Albumin: 3.8 g/dL (ref 3.5–5.0)
Alkaline Phosphatase: 75 U/L (ref 38–126)
Anion gap: 11 (ref 5–15)
BUN: 11 mg/dL (ref 6–20)
CO2: 27 mmol/L (ref 22–32)
Calcium: 9.2 mg/dL (ref 8.9–10.3)
Chloride: 98 mmol/L (ref 98–111)
Creatinine, Ser: 0.77 mg/dL (ref 0.44–1.00)
GFR, Estimated: >60 mL/min (ref >60)
Glucose, Bld: 99 mg/dL (ref 70–99)
Potassium: 3.3 mmol/L — ABNORMAL LOW (ref 3.5–5.1)
Sodium: 136 mmol/L (ref 135–145)
Total Bilirubin: 0.3 mg/dL (ref 0.3–1.2)
Total Protein: 7.2 g/dL (ref 6.5–8.1)

## 2022-01-22 LAB — URINALYSIS, ROUTINE W REFLEX MICROSCOPIC
Bilirubin Urine: NEGATIVE
Glucose, UA: NEGATIVE mg/dL
Hgb urine dipstick: NEGATIVE
Ketones, ur: 80 mg/dL — AB
Leukocytes,Ua: NEGATIVE
Nitrite: NEGATIVE
Protein, ur: NEGATIVE mg/dL
Specific Gravity, Urine: 1.017 (ref 1.005–1.030)
pH: 5 (ref 5.0–8.0)

## 2022-01-22 LAB — C-REACTIVE PROTEIN: CRP: 15.4 mg/dL — ABNORMAL HIGH (ref <1.0)

## 2022-01-22 LAB — CBC WITH DIFFERENTIAL/PLATELET
Abs Immature Granulocytes: 0.07 10*3/uL (ref 0.00–0.07)
Basophils Absolute: 0 10*3/uL (ref 0.0–0.1)
Basophils Relative: 1 %
Eosinophils Absolute: 0.1 10*3/uL (ref 0.0–0.5)
Eosinophils Relative: 2 %
HCT: 38.6 % (ref 36.0–46.0)
Hemoglobin: 12.8 g/dL (ref 12.0–15.0)
Immature Granulocytes: 1 %
Lymphocytes Relative: 19 %
Lymphs Abs: 1.3 10*3/uL (ref 0.7–4.0)
MCH: 31.6 pg (ref 26.0–34.0)
MCHC: 33.2 g/dL (ref 30.0–36.0)
MCV: 95.3 fL (ref 80.0–100.0)
Monocytes Absolute: 0.9 10*3/uL (ref 0.1–1.0)
Monocytes Relative: 12 %
Neutro Abs: 4.6 10*3/uL (ref 1.7–7.7)
Neutrophils Relative %: 65 %
Platelets: 184 10*3/uL (ref 150–400)
RBC: 4.05 MIL/uL (ref 3.87–5.11)
RDW: 12.6 % (ref 11.5–15.5)
WBC: 7 10*3/uL (ref 4.0–10.5)
nRBC: 0 % (ref 0.0–0.2)

## 2022-01-22 LAB — PROTIME-INR
INR: 1.5 — ABNORMAL HIGH (ref 0.8–1.2)
Prothrombin Time: 17.8 seconds — ABNORMAL HIGH (ref 11.4–15.2)

## 2022-01-22 LAB — HCG, SERUM, QUALITATIVE: Preg, Serum: NEGATIVE

## 2022-01-22 LAB — APTT: aPTT: 34 seconds (ref 24–36)

## 2022-01-22 LAB — LACTIC ACID, PLASMA: Lactic Acid, Venous: 1.2 mmol/L (ref 0.5–1.9)

## 2022-01-22 LAB — SEDIMENTATION RATE: Sed Rate: 70 mm/hr — ABNORMAL HIGH (ref 0–22)

## 2022-01-22 IMAGING — US US BREAST*R* LIMITED INC AXILLA
1 series · 9 of 9 positions shown · non-contrast
Comparison: None

CLINICAL DATA: Status post mastectomy on [DATE] and implant
placement on [DATE]. Breast swelling and redness. Evaluate for
abscess.

EXAM:
ULTRASOUND OF THE right BREAST

[Series 1: us breast ltd uni right inc axilla · 9 acquisitions, 9 frames shown]
[im 1/9]
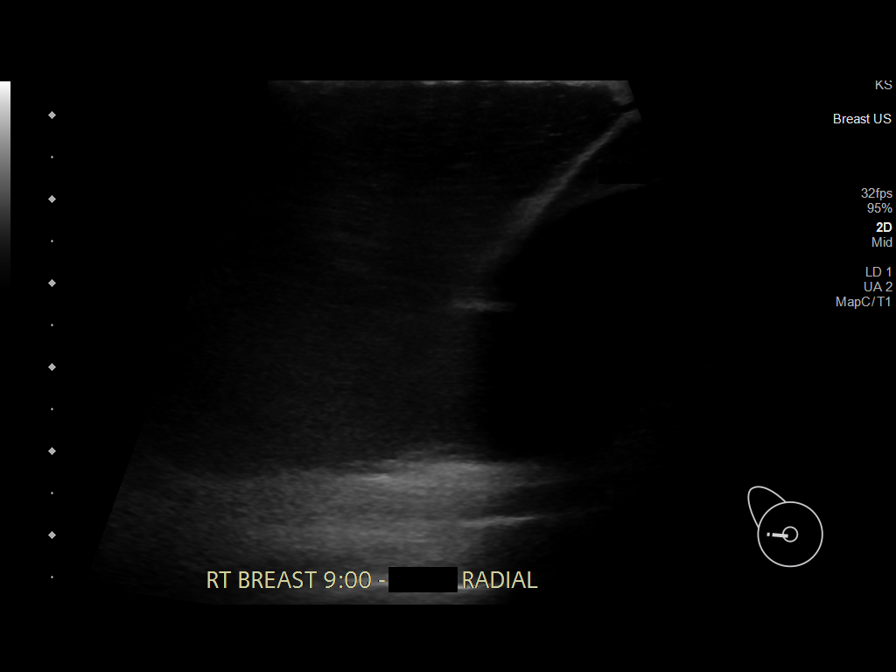
[im 2/9]
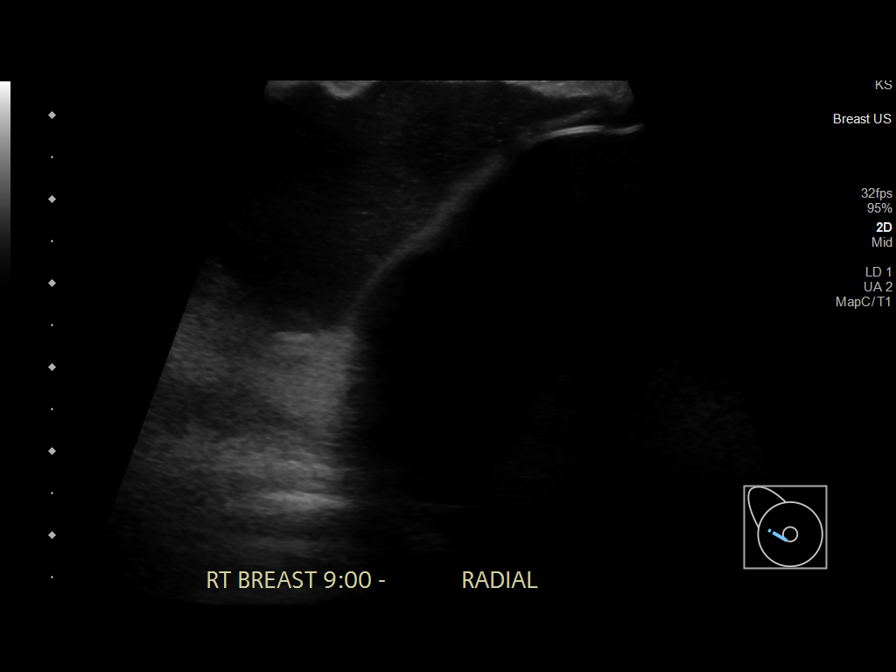
[im 3/9]
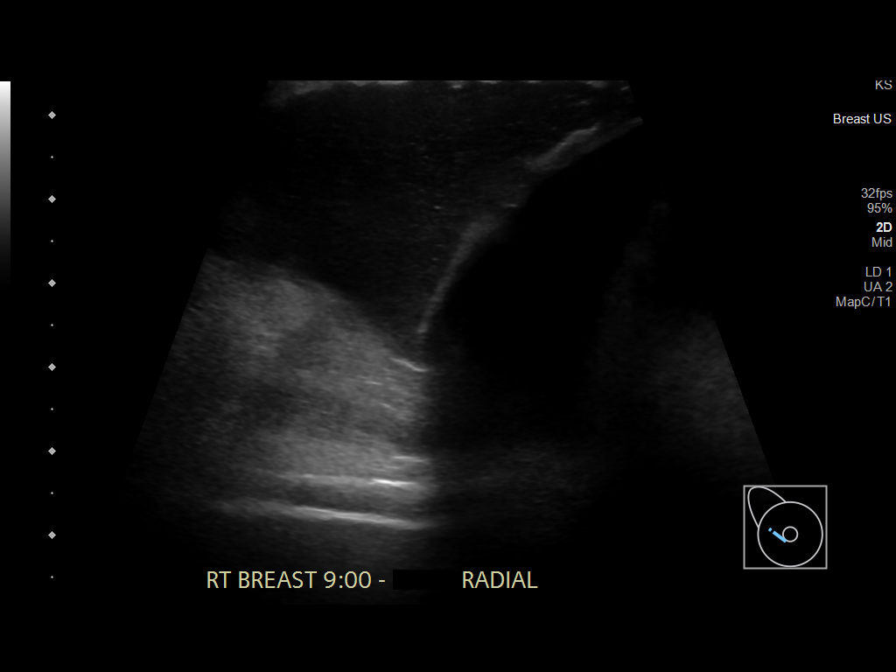
[im 4/9]
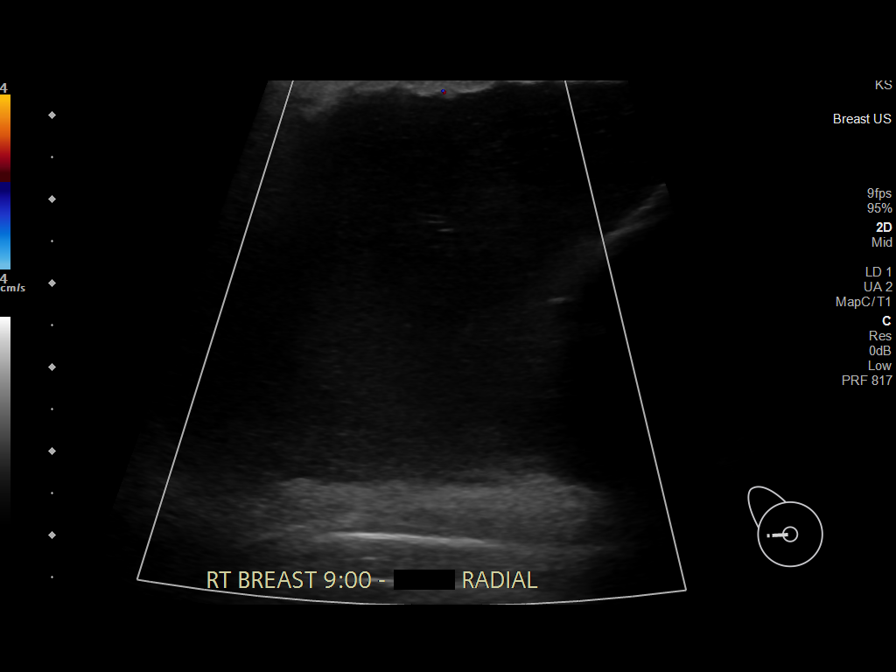
[im 5/9]
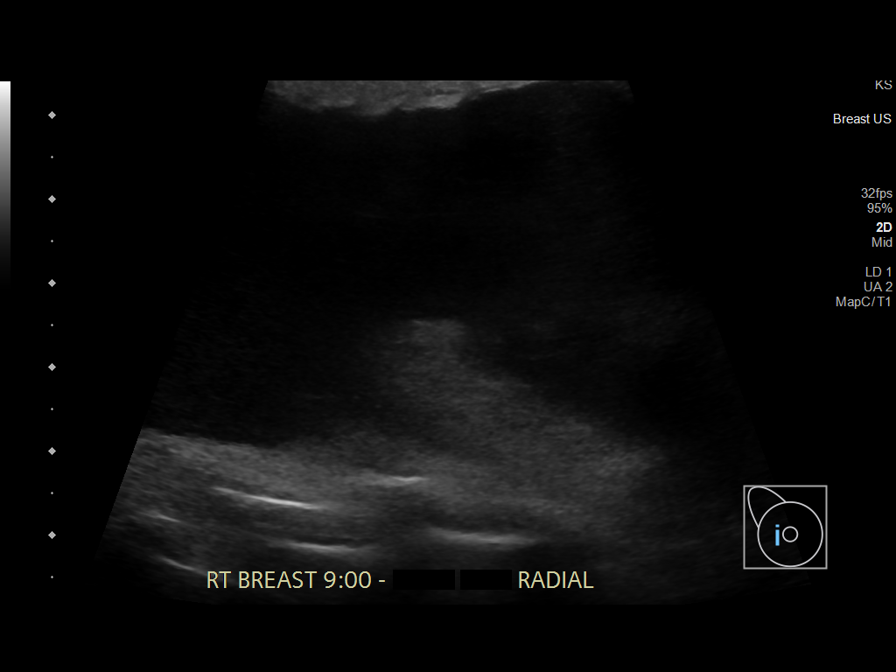
[im 6/9]
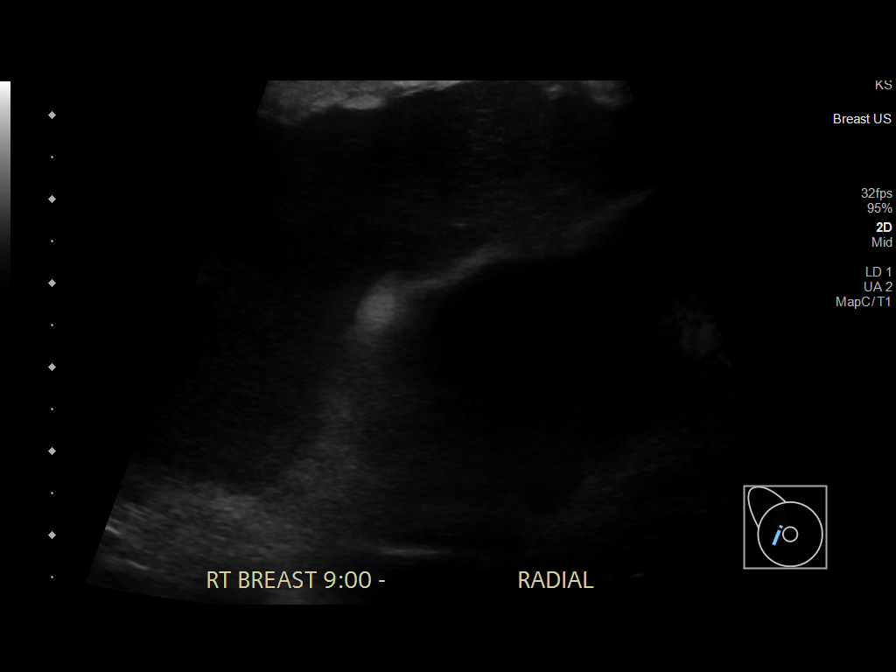
[im 7/9]
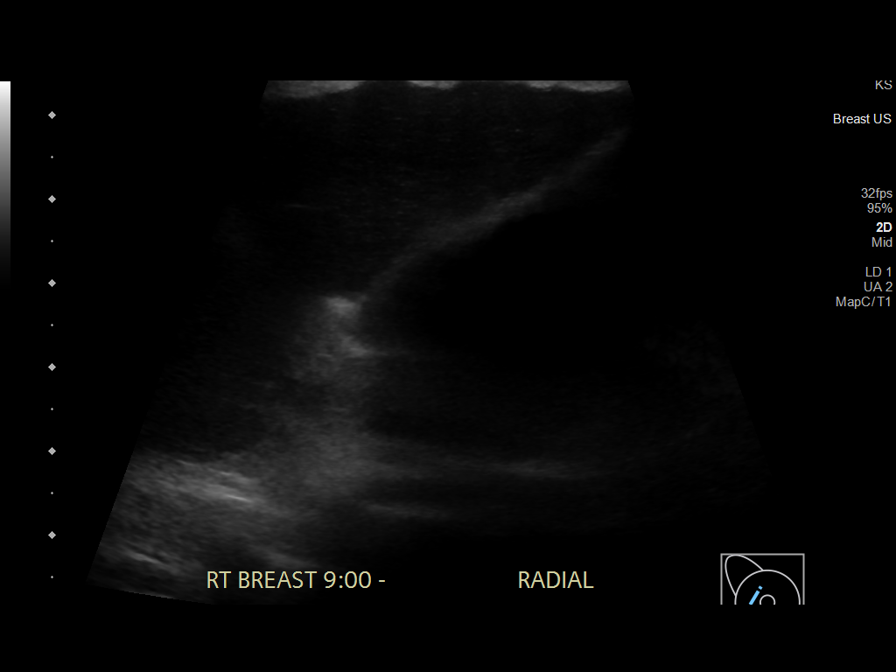
[im 8/9]
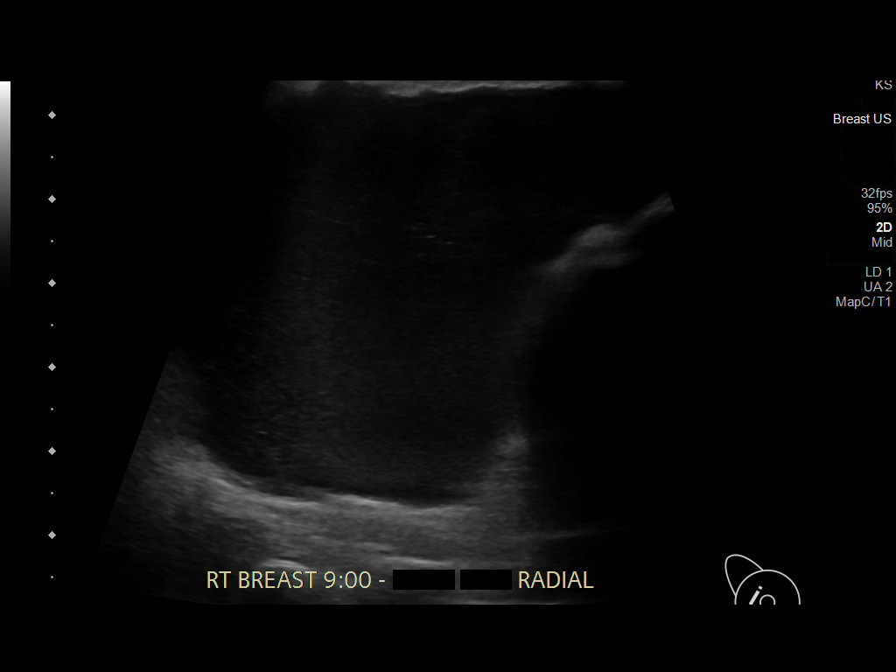
[im 9/9]
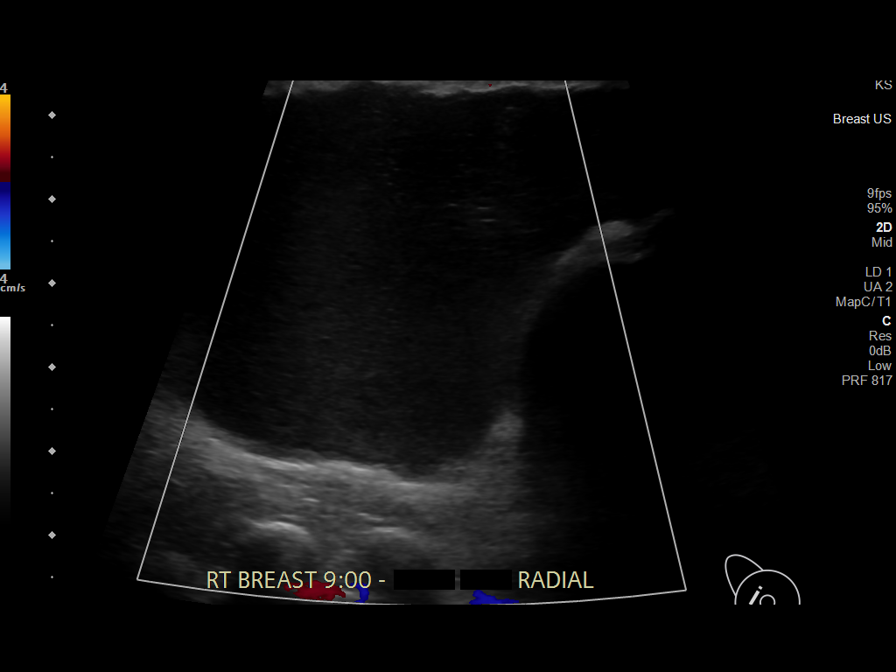

[9 of 9 positions shown; findings below may reference images not displayed]

FINDINGS: On physical exam, the breast was reported to be swelling, tender,
and erythematous.

Targeted ultrasound is performed, showing a fluid collection
containing no liver echogenic debris. The collection measures
approximately 5.6 x 4.0 cm and extends from the [DATE] to [DATE]
position. No internal vascularity noted within this collection. No
significant hyperemia or edema in the surrounding soft tissue.
IMPRESSION: Minimally complex collection, likely a seroma. Developing abscess is
not excluded. Clinical correlation is recommended.

## 2022-01-22 IMAGING — DX DG CHEST 1V PORT
1 series · 1 of 1 positions shown · non-contrast
Comparison: Report from prior chest radiograph [DATE] (images
unavailable).

CLINICAL DATA: Provided history: Breast implant infection, right.

EXAM:
PORTABLE CHEST 1 VIEW

[chest ap]
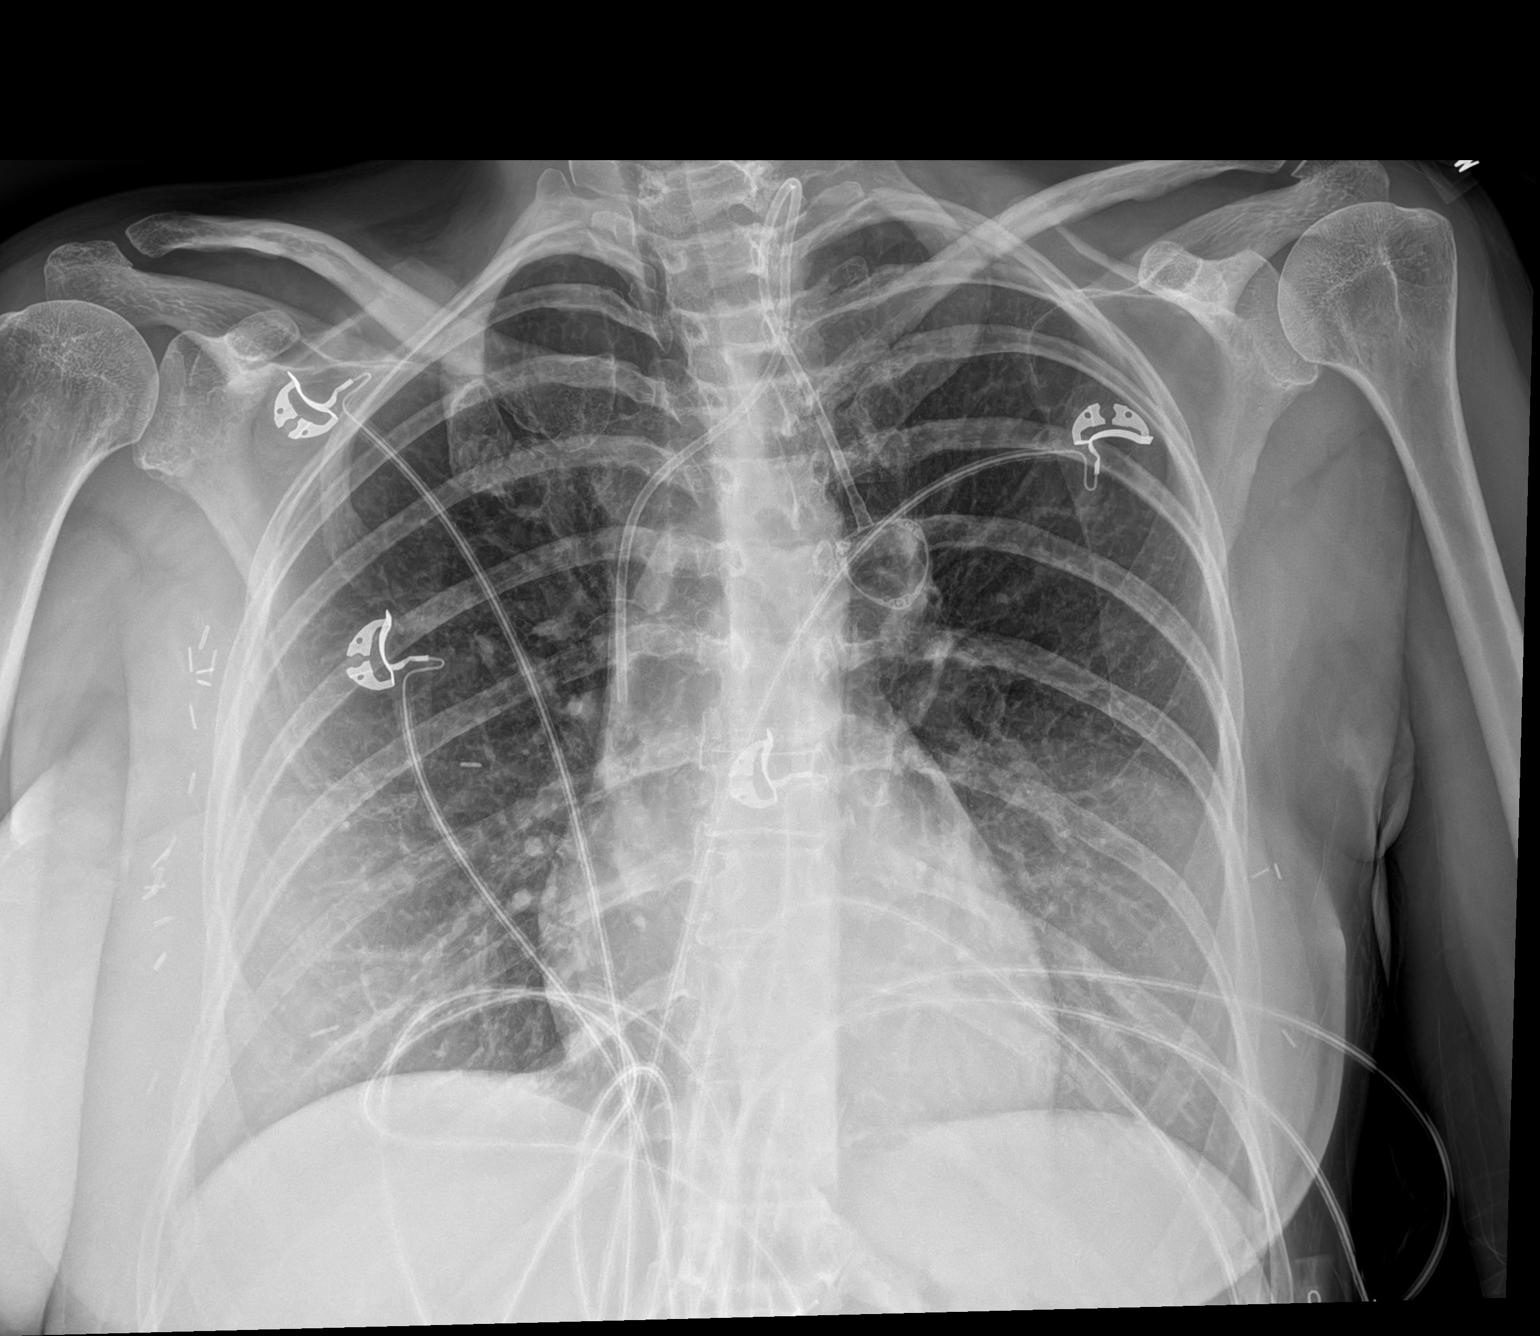

[1 of 1 positions shown; findings below may reference images not displayed]

FINDINGS: Left chest infusion port catheter with tip projecting at the level
of the lower SVC. Heart size within normal limits. No appreciable
airspace consolidation. No evidence of pleural effusion or
pneumothorax. No acute bony abnormality identified. Surgical clips
project in the region of the bilateral chest and right axilla.
Thoracic levocurvature.
IMPRESSION: No evidence of acute cardiopulmonary abnormality.

## 2022-01-22 MED ORDER — VALACYCLOVIR HCL 500 MG PO TABS
500.0000 mg | ORAL_TABLET | Freq: Once | ORAL | Status: AC
  Administered 2022-01-22: 500 mg via ORAL
  Filled 2022-01-22: qty 1

## 2022-01-22 MED ORDER — POTASSIUM CHLORIDE 2 MEQ/ML IV SOLN: INTRAVENOUS | Status: DC

## 2022-01-22 MED ORDER — SODIUM CHLORIDE 0.9 % IV SOLN
2.0000 g | INTRAVENOUS | Status: DC
  Administered 2022-01-22 – 2022-01-24 (×3): 2 g via INTRAVENOUS
  Filled 2022-01-22 (×3): qty 20

## 2022-01-22 MED ORDER — KCL IN DEXTROSE-NACL 20-5-0.45 MEQ/L-%-% IV SOLN
INTRAVENOUS | Status: DC
  Filled 2022-01-22 (×4): qty 1000

## 2022-01-22 MED ORDER — VANCOMYCIN HCL IN DEXTROSE 1-5 GM/200ML-% IV SOLN
1000.0000 mg | Freq: Once | INTRAVENOUS | Status: AC
  Administered 2022-01-22: 1000 mg via INTRAVENOUS
  Filled 2022-01-22: qty 200

## 2022-01-22 MED ORDER — POTASSIUM CHLORIDE 20 MEQ PO PACK
40.0000 meq | PACK | Freq: Once | ORAL | Status: DC
Start: 2022-01-22 — End: 2022-01-22
  Filled 2022-01-22: qty 2

## 2022-01-22 MED ORDER — VANCOMYCIN HCL 750 MG/150ML IV SOLN
750.0000 mg | Freq: Two times a day (BID) | INTRAVENOUS | Status: DC
  Administered 2022-01-22 – 2022-01-25 (×6): 750 mg via INTRAVENOUS
  Filled 2022-01-22 (×7): qty 150

## 2022-01-22 MED ORDER — ACETAMINOPHEN 325 MG PO TABS
650.0000 mg | ORAL_TABLET | Freq: Once | ORAL | Status: AC
  Administered 2022-01-22: 650 mg via ORAL
  Filled 2022-01-22: qty 2

## 2022-01-22 MED ORDER — POTASSIUM CHLORIDE CRYS ER 20 MEQ PO TBCR
40.0000 meq | EXTENDED_RELEASE_TABLET | Freq: Once | ORAL | Status: AC
  Administered 2022-01-22: 40 meq via ORAL
  Filled 2022-01-22: qty 2

## 2022-01-22 MED ORDER — OXYCODONE HCL 5 MG PO TABS
5.0000 mg | ORAL_TABLET | ORAL | Status: DC | PRN
  Administered 2022-01-22: 10 mg via ORAL
  Filled 2022-01-22: qty 2

## 2022-01-22 MED ORDER — LACTATED RINGERS IV BOLUS (SEPSIS)
1000.0000 mL | Freq: Once | INTRAVENOUS | Status: AC
  Administered 2022-01-22: 1000 mL via INTRAVENOUS

## 2022-01-22 MED ORDER — ACETAMINOPHEN 500 MG PO TABS
1000.0000 mg | ORAL_TABLET | Freq: Four times a day (QID) | ORAL | Status: DC | PRN
  Administered 2022-01-22 – 2022-01-26 (×3): 1000 mg via ORAL
  Filled 2022-01-22 (×3): qty 2

## 2022-01-22 NOTE — ED Notes (Addendum)
Patient has not been kept NPO. Patient was given water with medication and reported she was going to be sick if she could not drink something. Due to her medical hx patient was allowed sips of water. She has not eaten anything since arrival.  ?

## 2022-01-22 NOTE — Assessment & Plan Note (Signed)
S/p neoadjuvant chemotherapy, bilateral mastectomy with breast implant placement.  Follows with oncology, Dr. Lindi Adie. ?

## 2022-01-22 NOTE — Telephone Encounter (Signed)
Received a phone call from Facility: [facility] ?Requesting MD: [place] ?Patient with h/o breast cancer presenting with infected R breast implant.   ?Plan of care:  Dr. Marla Roe is going to admit the patient and just needed help with logistics.  [facility] is not involved in her care at this time but happy to consult if needed. ? ? ? ? ?Carlyon Shadow, M.D. ?[facility]  ?

## 2022-01-22 NOTE — Progress Notes (Signed)
? ?  Subjective:  ? ? Patient ID: Jenna Cross, female    DOB: June 27, 1986, 36 y.o.   MRN: [id number] ? ?The patient is a 36 year old female here for evaluation of her right breast.  She underwent bilateral mastectomies in March and then had exchange to implants March 27.  Over the last few days she did have exposure to some sick kids.  She noticed increase in her temperature and redness of the right breast.  The left breast has remained stable.  Over the weekend she was given antibiotics and said she felt better until this morning.  She noticed dramatic increase in swelling and redness of the right breast and a spike in her temperature.  The physical exam is very concerning at this point for infection. ? ? ? ?Review of Systems  ?Constitutional:  Positive for activity change, appetite change and chills.  ?Eyes: Negative.   ?Respiratory:  Positive for chest tightness. Negative for shortness of breath.   ?Cardiovascular: Negative.   ?Gastrointestinal: Negative.   ?Genitourinary: Negative.   ?Musculoskeletal: Negative.   ? ?   ?Objective:  ? Physical Exam ?HENT:  ?   Head: Atraumatic.  ?Cardiovascular:  ?   Rate and Rhythm: Normal rate.  ?   Pulses: Normal pulses.  ?Musculoskeletal:     ?   General: Swelling and deformity present.  ?Skin: ?   Capillary Refill: Capillary refill takes less than 2 seconds.  ?   Coloration: Skin is not jaundiced.  ?   Findings: No bruising.  ?Neurological:  ?   Mental Status: She is alert and oriented to person, place, and time.  ?Psychiatric:     ?   Mood and Affect: Mood normal.     ?   Behavior: Behavior normal.     ?   Thought Content: Thought content normal.  ? ? ? ?   ?Assessment & Plan:  ? ?  ICD-10-CM   ?1. Malignant neoplasm of upper-outer quadrant of right breast in female, estrogen receptor positive ([place])  C50.411   ? Z17.0   ?  ?  ?We discussed options of admission with IV antibiotics and plan for washout either today or tomorrow.  There is the possibility of washing the  implant putting it back or putting in a whole new implant.  There is also the concern that it looks bad and we are not able to put anything back in there.  The other question the patient had was if we could put the expander back in.  The first move is to get her admitted and start IV antibiotics.  She has been sent to the emergency room at [facility]. ? ?

## 2022-01-22 NOTE — Assessment & Plan Note (Signed)
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  Has been on Xarelto, last dose taken 4/17 AM. ?-Hold Xarelto for now ?

## 2022-01-22 NOTE — ED Triage Notes (Signed)
Pt arrives to ED with c/o right breast pain. This started x4 days ago with associated fevers. Associated symptoms include breast redness and swelling. She has hx of right breast reconstruction to the breast and hx of breast cancer.  ?

## 2022-01-22 NOTE — Progress Notes (Signed)
Pharmacy Antibiotic Note ? ?Jenna Cross is a 36 y.o. female admitted on 01/22/2022 with cellulitis.  Pharmacy has been consulted for vancomycin dosing. Planning washout either 4/17 or 4/18 per MD note. SCr 0.77 on presentation. ? ?Plan: ?Vancomycin 1g IV x 1 (due to product availability at [facility] ED); then '750mg'$  IV q12h ?Monitor clinical progress, c/s, renal function ?F/u antibiotic plan/LOT, vancomycin levels as indicated ? ? ?Height: '5\' 4"'$  (162.6 cm) ?Weight: 59.9 kg (132 lb) ?IBW/kg (Calculated) : 54.7 ? ?Temp (24hrs), Avg:98.1 ?F (36.7 ?C), Min:98.1 ?F (36.7 ?C), Max:98.1 ?F (36.7 ?C) ? ?Recent Labs  ?Lab 01/22/22 ?1001  ?WBC 7.0  ?  ?CrCl cannot be calculated (Patient's most recent lab result is older than the maximum 21 days allowed.).   ? ?No Known Allergies ? ?Arturo Morton, PharmD, BCPS ?Please check AMION for all [facility] contact numbers ?Clinical Pharmacist ?01/22/2022 10:17 AM ? ?

## 2022-01-22 NOTE — Consult Note (Signed)
Initial Consultation Note ? ? ?Patient: Jenna Cross LJQ:[id number] DOB: 03-03-1986 PCP: Dian Queen, MD ?DOA: 01/22/2022 ?DOS: the patient was seen and examined on 01/22/2022 ?Primary service: Wallace Going, DO ? ?Referring physician: Madaline Savage, DO (Plastic Surgery) ?Reason for consult: Medical management ? ?Assessment/Plan: ?Principal Problem: ?  Infection of right breast ?Active Problems: ?  Malignant neoplasm of upper-outer quadrant of right breast in female, estrogen receptor positive ([place]) ?  History of DVT (deep vein thrombosis) ?Assessment and Plan: ?* Infection of right breast ?Patient s/p bilateral mastectomy admitted with increased erythema, swelling, pain to right breast consistent with infection failing outpatient antibiotic therapy.  Plastic surgery plan for washout during admission. ?-Continue IV vancomycin per pharmacy ?-Add IV ceftriaxone 2 g daily ?-Follow blood cultures ? 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  Has been on Xarelto, last dose taken 4/17 AM. ?-Hold Xarelto for now ? ?Malignant neoplasm of upper-outer quadrant of right breast in female, estrogen receptor positive ([place]) ?S/p neoadjuvant chemotherapy, bilateral mastectomy with breast implant placement.  Follows with oncology, Dr. Lindi Adie. ? ?[facility] will continue to follow the patient. ? ?HPI: ?Jenna Cross is a 36 y.o. female with past medical history of malignant neoplasm of right breast s/p neoadjuvant chemotherapy, bilateral mastectomy with breast reconstruction, history of DVT to left IJ from Port-A-Cath on Xarelto who presented to the ED for evaluation of right breast infection. ? ?Patient reports spiking a fever on 4/14 at home.  She noticed increased redness and swelling to her right breast.  She was started on antibiotics with a course of Bactrim by her plastic surgery team.  She had been taking Tylenol for fever and pain.  Initially she noticed improvement  however has continued intermittent fevers associated with chills, diaphoresis, and progressive erythema and swelling to her right breast.  She has not seen any open wound or drainage from her surgical incision sites.  She has had nausea without emesis.  She denies any abdominal pain or diarrhea. ? ?She saw her plastic surgeon, Dr. Marla Roe, this morning (4/17).  Due to worsening infectious appearance failing outpatient antibiotics, patient was initially sent to the [facility] ED for further evaluation.  She was started on IV vancomycin.  Blood cultures were obtained and pending.  She has subsequently been admitted to the plastic surgery service with plans for continued IV antibiotics and washout tomorrow. ? ?Review of Systems: As mentioned in the history of present illness. All other systems reviewed and are negative. ?Past Medical History:  ?Diagnosis Date  ? Anemia   ? pregnancy related  ? Anxiety   ? Anxiety   ? no medication  ? Cancer [facility])   ? breast  ? COVID 09/2020  ? mild case  ? Family history of prostate cancer 07/17/2021  ? History of DVT (deep vein thrombosis)   ? in her neck  ? ?Past Surgical History:  ?Procedure Laterality Date  ? BREAST RECONSTRUCTION WITH PLACEMENT OF TISSUE EXPANDER AND FLEX HD (ACELLULAR HYDRATED DERMIS) Bilateral 12/06/2021  ? Procedure: BREAST RECONSTRUCTION WITH PLACEMENT OF TISSUE EXPANDER AND FLEX HD (ACELLULAR HYDRATED DERMIS);  Surgeon: Wallace Going, DO;  Location: [place];  Service: Plastics;  Laterality: Bilateral;  ? BREAST SURGERY    ? cyst removal from right breast   ? CESAREAN SECTION N/A 01/31/2021  ? Procedure: CESAREAN SECTION;  Surgeon: Dian Queen, MD;  Location: [place] [facility];  Service: Obstetrics;  Laterality:  N/A;  ? excision of fibroadenoma of breast    ? PORTACATH PLACEMENT N/A 07/11/2021  ? Procedure: INSERTION PORT-A-CATH;  Surgeon: Rolm Bookbinder, MD;  Location: [place];  Service: General;   Laterality: N/A;  ? RADIOACTIVE SEED GUIDED AXILLARY SENTINEL LYMPH NODE Right 12/06/2021  ? Procedure: RADIOACTIVE SEED GUIDED RIGHT AXILLARY SENTINEL LYMPH NODE EXCISION;  Surgeon: Rolm Bookbinder, MD;  Location: [place];  Service: General;  Laterality: Right;  ? REMOVAL OF BILATERAL TISSUE EXPANDERS WITH PLACEMENT OF BILATERAL BREAST IMPLANTS Bilateral 01/01/2022  ? Procedure: REMOVAL OF BILATERAL TISSUE EXPANDERS WITH PLACEMENT OF BILATERAL BREAST IMPLANTS;  Surgeon: Wallace Going, DO;  Location: [place];  Service: Plastics;  Laterality: Bilateral;  ? SENTINEL NODE BIOPSY Right 12/06/2021  ? Procedure: RIGHT AXILLARY SENTINEL NODE BIOPSY;  Surgeon: Rolm Bookbinder, MD;  Location: [place];  Service: General;  Laterality: Right;  ? TONSILLECTOMY    ? and adenoids  ? TONSILLECTOMY AND ADENOIDECTOMY    ? TOTAL MASTECTOMY Bilateral 12/06/2021  ? Procedure: BILATERAL SKIN SPARING TOTAL MASTECTOMY;  Surgeon: Rolm Bookbinder, MD;  Location: [place];  Service: General;  Laterality: Bilateral;  ? WISDOM TOOTH EXTRACTION    ? ?Social History:  reports that she has never smoked. She has never used smokeless tobacco. She reports that she does not currently use alcohol. She reports that she does not use drugs. ? ?Allergies  ?Allergen Reactions  ? Wound Dressings Other (See Comments)  ?  Honey comb dressing causes redness and inflammation   ? ? ?Family History  ?Problem Relation Age of Onset  ? Migraines Mother   ? Lymphoma Paternal Aunt   ?     dx late 25s  ? Prostate cancer Maternal Grandfather   ?     dx after 50  ? ? ?Prior to Admission medications   ?Medication Sig Start Date End Date Taking? Authorizing Provider  ?acetaminophen (TYLENOL) 500 MG tablet Take 1,000 mg by mouth every 6 (six) hours as needed for mild pain or headache. 01/03/20   [provider]  ?ALPRAZolam Duanne Moron) 0.5 MG tablet Take 0.5 mg by mouth 3 (three) times daily as needed for  anxiety. 10/08/18   [provider]  ?diphenoxylate-atropine (LOMOTIL) 2.5-0.025 MG tablet Take 1 tablet by mouth 3 (three) times daily as needed for diarrhea or loose stools. 08/02/21   Nicholas Lose, MD  ?enoxaparin (LOVENOX) 60 MG/0.6ML injection Inject 0.6 mLs (60 mg total) into the skin every 12 (twelve) hours. 12/20/21   Nicholas Lose, MD  ?escitalopram (LEXAPRO) 20 MG tablet Take 20 mg by mouth every evening. 06/08/21   [provider]  ?lidocaine-prilocaine (EMLA) cream Apply to affected area once ?Patient taking differently: Apply 1 application. topically as needed (for [facility] access). Apply to affected area once 12/20/21   Nicholas Lose, MD  ?loratadine (CLARITIN) 10 MG tablet Take 10 mg by mouth daily as needed for allergies. 12/06/21   [provider]  ?magic mouthwash w/lidocaine SOLN Take 5 mLs by mouth 3 (three) times daily as needed for mouth pain. 07/19/21   Nicholas Lose, MD  ?methocarbamol (ROBAXIN) 500 MG tablet Take 1 tablet (500 mg total) by mouth every 12 (twelve) hours as needed for up to 10 days for muscle spasms. 01/18/22 01/28/22  Scheeler, Carola Rhine, PA-C  ?rivaroxaban (XARELTO) 20 MG TABS tablet Take 1 tablet (20 mg total) by mouth daily with supper. 10/26/21   Nicholas Lose, MD  ?sulfamethoxazole-trimethoprim (BACTRIM DS)  800-160 MG tablet Take 1 tablet by mouth 2 (two) times daily for 7 days. 01/18/22 01/25/22  Scheeler, Carola Rhine, PA-C  ?triamcinolone (KENALOG) 0.025 % ointment Apply 1 application. topically 2 (two) times daily. 01/09/22   Scheeler, Carola Rhine, PA-C  ?valACYclovir (VALTREX) 500 MG tablet Take 500 mg by mouth every evening. 10/08/18   [provider]  ?prochlorperazine (COMPAZINE) 10 MG tablet Take 1 tablet (10 mg total) by mouth every 6 (six) hours as needed (Nausea or vomiting). 06/29/21 12/20/21  Nicholas Lose, MD  ? ? ?Physical Exam: ?Vitals:  ? 01/22/22 0953 01/22/22 0955 01/22/22 1501  ?BP: (!) 103/53  111/74  ?Pulse: (!) 113  (!) 101  ?Resp: 18  18   ?Temp: 98.1 ?F (36.7 ?C)  98.1 ?F (36.7 ?C)  ?TempSrc: Oral  Oral  ?SpO2: 100%  96%  ?Weight:  59.9 kg   ?Height:  '5\' 4"'$  (1.626 m)   ? ?General: Sitting up in bed ?HEENT: EOMI, no scleral icterus ?Cardiac: RRR, n

## 2022-01-22 NOTE — ED Provider Notes (Signed)
?[place] EMERGENCY DEPT ?Provider Note ? ? ?CSN: [id number] ?Arrival date & time: 01/22/22  0944 ? ?  ? ?History ? ?Chief Complaint  ?Patient presents with  ? Breast Pain  ? ? ?Jenna Cross is a 36 y.o. female. ? ? Patient as above with significant medical history as below, including History of breast cancer,, status post bilateral mastectomy, bilateral implant placement 3/27. who presents to the ED with complaint of infected right breast tissue.  She was sent to the ER by plastic surgery office, Dr. Marla Roe.  Patient began to have redness and irritation to right breast proximally 5 days ago, she started on Bactrim.  Initial improvement to her symptoms but fevers have returned, pain is returned.  Redness worsened.  Last fever was overnight 101. Mild constipation.  She is tolerant p.o. intake without difficulty.  Taking medications as prescribed.  No change in bladder function, no chest pain, dyspnea.  No recent travel or sick contacts. ? ?Pain to right breast is aching, dull, pressure.  Difficulty moving her right arm secondary to discomfort to right breast and swelling. ? ? ?Past Medical History: ?No date: Anemia ?    Comment:  pregnancy related ?No date: Anxiety ?No date: Anxiety ?    Comment:  no medication ?No date: Cancer [facility]) ?    Comment:  breast ?09/2020: COVID ?    Comment:  mild case ?07/17/2021: Family history of prostate cancer ?No date: History of DVT (deep vein thrombosis) ?    Comment:  in her neck ? ?Past Surgical History: ?12/06/2021: BREAST RECONSTRUCTION WITH PLACEMENT OF TISSUE EXPANDER AND  ?FLEX HD (ACELLULAR HYDRATED DERMIS); Bilateral ?    Comment:  Procedure: BREAST RECONSTRUCTION WITH PLACEMENT OF  ?             TISSUE EXPANDER AND FLEX HD (ACELLULAR HYDRATED DERMIS);  ?             Surgeon: Wallace Going, DO;  Location: No Name  ?             SURGERY CENTER;  Service: Plastics;  Laterality:  ?             Bilateral; ?No date: BREAST SURGERY ?    Comment:   cyst removal from right breast  ?01/31/2021: CESAREAN SECTION; N/A ?    Comment:  Procedure: CESAREAN SECTION;  Surgeon: Dian Queen, ?             MD;  [facility];  Service: Obstetrics;   ?             Laterality: N/A; ?No date: excision of fibroadenoma of breast ?07/11/2021: PORTACATH PLACEMENT; N/A ?    Comment:  Procedure: INSERTION PORT-A-CATH;  Surgeon: Donne Hazel,  ?             Rodman Key, MD;  Location: [place];   ?             Service: General;  Laterality: N/A; ?12/06/2021: RADIOACTIVE SEED GUIDED AXILLARY SENTINEL LYMPH NODE; Right ?    Comment:  Procedure: RADIOACTIVE SEED GUIDED RIGHT AXILLARY  ?             SENTINEL LYMPH NODE EXCISION;  Surgeon: Donne Hazel,  ?             Rodman Key, MD;  Location: [place];   ?             Service: General;  Laterality: Right; ?01/01/2022: REMOVAL  OF BILATERAL TISSUE EXPANDERS WITH PLACEMENT OF  ?BILATERAL BREAST IMPLANTS; Bilateral ?    Comment:  Procedure: REMOVAL OF BILATERAL TISSUE EXPANDERS WITH  ?             PLACEMENT OF BILATERAL BREAST IMPLANTS;  Surgeon:  ?             Wallace Going, DO;  Location: [place];  Service:  ?             Plastics;  Laterality: Bilateral; ?12/06/2021: SENTINEL NODE BIOPSY; Right ?    Comment:  Procedure: RIGHT AXILLARY SENTINEL NODE BIOPSY;   ?             Surgeon: Rolm Bookbinder, MD;  Location: [place]  ?             SURGERY CENTER;  Service: General;  Laterality: Right; ?No date: TONSILLECTOMY ?    Comment:  and adenoids ?No date: TONSILLECTOMY AND ADENOIDECTOMY ?12/06/2021: TOTAL MASTECTOMY; Bilateral ?    Comment:  Procedure: BILATERAL SKIN SPARING TOTAL MASTECTOMY;   ?             Surgeon: Rolm Bookbinder, MD;  Location: [place]  ?             SURGERY CENTER;  Service: General;  Laterality:  ?             Bilateral; ?No date: WISDOM TOOTH EXTRACTION  ? ? ?The history is provided by the patient and a parent. No language interpreter was used.  ? ?  ? ?Home Medications ?Prior to  Admission medications   ?Medication Sig Start Date End Date Taking? Authorizing Provider  ?acetaminophen (TYLENOL) 500 MG tablet Take 1,000 mg by mouth every 6 (six) hours as needed for mild pain or headache. 01/03/20   [provider]  ?ALPRAZolam Duanne Moron) 0.5 MG tablet Take 0.5 mg by mouth 3 (three) times daily as needed for anxiety. 10/08/18   [provider]  ?diphenoxylate-atropine (LOMOTIL) 2.5-0.025 MG tablet Take 1 tablet by mouth 3 (three) times daily as needed for diarrhea or loose stools. 08/02/21   Nicholas Lose, MD  ?enoxaparin (LOVENOX) 60 MG/0.6ML injection Inject 0.6 mLs (60 mg total) into the skin every 12 (twelve) hours. 12/20/21   Nicholas Lose, MD  ?escitalopram (LEXAPRO) 20 MG tablet Take 20 mg by mouth every evening. 06/08/21   [provider]  ?lidocaine-prilocaine (EMLA) cream Apply to affected area once ?Patient taking differently: Apply 1 application. topically as needed (for [facility] access). Apply to affected area once 12/20/21   Nicholas Lose, MD  ?loratadine (CLARITIN) 10 MG tablet Take 10 mg by mouth daily as needed for allergies. 12/06/21   [provider]  ?magic mouthwash w/lidocaine SOLN Take 5 mLs by mouth 3 (three) times daily as needed for mouth pain. 07/19/21   Nicholas Lose, MD  ?methocarbamol (ROBAXIN) 500 MG tablet Take 1 tablet (500 mg total) by mouth every 12 (twelve) hours as needed for up to 10 days for muscle spasms. 01/18/22 01/28/22  Scheeler, Carola Rhine, PA-C  ?rivaroxaban (XARELTO) 20 MG TABS tablet Take 1 tablet (20 mg total) by mouth daily with supper. 10/26/21   Nicholas Lose, MD  ?sulfamethoxazole-trimethoprim (BACTRIM DS) 800-160 MG tablet Take 1 tablet by mouth 2 (two) times daily for 7 days. 01/18/22 01/25/22  Scheeler, Carola Rhine, PA-C  ?triamcinolone (KENALOG) 0.025 % ointment Apply 1 application. topically 2 (two) times daily. 01/09/22   Scheeler, Carola Rhine, PA-C  ?valACYclovir Estell Harpin)  500 MG tablet Take 500 mg by mouth every evening. 10/08/18    [provider]  ?prochlorperazine (COMPAZINE) 10 MG tablet Take 1 tablet (10 mg total) by mouth every 6 (six) hours as needed (Nausea or vomiting). 06/29/21 12/20/21  Nicholas Lose, MD  ?   ? ?Allergies    ?Patient has no known allergies.   ? ?Review of Systems   ?Review of Systems  ?Constitutional:  Positive for fever. Negative for chills.  ?HENT:  Negative for facial swelling and trouble swallowing.   ?Eyes:  Negative for photophobia and visual disturbance.  ?Respiratory:  Negative for cough and shortness of breath.   ?Cardiovascular:  Negative for chest pain and palpitations.  ?Gastrointestinal:  Negative for abdominal pain, nausea and vomiting.  ?Endocrine: Negative for polydipsia and polyuria.  ?Genitourinary:  Negative for difficulty urinating and hematuria.  ?Musculoskeletal:  Positive for arthralgias. Negative for gait problem and joint swelling.  ?Skin:  Positive for rash and wound. Negative for pallor.  ?Neurological:  Negative for syncope and headaches.  ?Psychiatric/Behavioral:  Negative for agitation and confusion.   ? ?Physical Exam ?Updated Vital Signs ?BP (!) 103/53 (BP Location: Left Arm)   Pulse (!) 113   Temp 98.1 ?F (36.7 ?C) (Oral)   Resp 18   Ht '5\' 4"'$  (1.626 m)   Wt 59.9 kg   SpO2 100%   BMI 22.66 kg/m?  ?Physical Exam ?Vitals and nursing note reviewed. Exam conducted with a chaperone present.  ?Constitutional:   ?   General: She is not in acute distress. ?   Appearance: Normal appearance.  ?HENT:  ?   Head: Normocephalic and atraumatic.  ?   Right Ear: External ear normal.  ?   Left Ear: External ear normal.  ?   Nose: Nose normal.  ?   Mouth/Throat:  ?   Mouth: Mucous membranes are moist.  ?Eyes:  ?   General: No scleral icterus.    ?   Right eye: No discharge.     ?   Left eye: No discharge.  ?Cardiovascular:  ?   Rate and Rhythm: Normal rate and regular rhythm.  ?   Pulses: Normal pulses.  ?   Heart sounds: Normal heart sounds.  ?Pulmonary:  ?   Effort: Pulmonary effort is  normal. No tachypnea, accessory muscle usage or respiratory distress.  ?   Breath sounds: Normal breath sounds. No decreased air movement. No decreased breath sounds.  ?Chest:  ? ? ?Abdominal:  ?   General:

## 2022-01-22 NOTE — Assessment & Plan Note (Signed)
Patient s/p bilateral mastectomy admitted with increased erythema, swelling, pain to right breast consistent with infection failing outpatient antibiotic therapy.  Plastic surgery plan for washout during admission. ?-Continue IV vancomycin per pharmacy ?-Add IV ceftriaxone 2 g daily ?-Follow blood cultures ?

## 2022-01-22 NOTE — ED Notes (Signed)
RN Messaged Dr. Marla Roe to check on Breast US order as the only Breast US that is approved for [facility] is to rule out abscess. DO stated that the imaging will wait until she is taken to [facility] for the Ultrasound.  ?

## 2022-01-23 ENCOUNTER — Inpatient Hospital Stay (HOSPITAL_COMMUNITY): Payer: 59

## 2022-01-23 ENCOUNTER — Other Ambulatory Visit (HOSPITAL_COMMUNITY): Payer: Self-pay | Admitting: Radiology

## 2022-01-23 ENCOUNTER — Encounter: Payer: 59 | Admitting: Plastic Surgery

## 2022-01-23 ENCOUNTER — Encounter (HOSPITAL_COMMUNITY): Payer: Self-pay | Admitting: Plastic Surgery

## 2022-01-23 ENCOUNTER — Ambulatory Visit: Payer: 59 | Admitting: Radiation Oncology

## 2022-01-23 DIAGNOSIS — Z170 Estrogen receptor positive status [ER+]: Secondary | ICD-10-CM

## 2022-01-23 DIAGNOSIS — N61 Mastitis without abscess: Secondary | ICD-10-CM | POA: Diagnosis not present

## 2022-01-23 HISTORY — PX: CHG RAD GUIDED,PERCUT DRAINAGE,W/CATH PLACE: 75989

## 2022-01-23 LAB — BASIC METABOLIC PANEL
Anion gap: 11 (ref 5–15)
Anion gap: 9 (ref 5–15)
BUN: 8 mg/dL (ref 6–20)
BUN: 7 mg/dL (ref 6–20)
CO2: 22 mmol/L (ref 22–32)
CO2: 23 mmol/L (ref 22–32)
Calcium: 8 mg/dL — ABNORMAL LOW (ref 8.9–10.3)
Calcium: 8 mg/dL — ABNORMAL LOW (ref 8.9–10.3)
Chloride: 102 mmol/L (ref 98–111)
Chloride: 102 mmol/L (ref 98–111)
Creatinine, Ser: 0.71 mg/dL (ref 0.44–1.00)
Creatinine, Ser: 0.74 mg/dL (ref 0.44–1.00)
GFR, Estimated: >60 mL/min (ref >60)
GFR, Estimated: >60 mL/min (ref >60)
Glucose, Bld: 99 mg/dL (ref 70–99)
Glucose, Bld: 112 mg/dL — ABNORMAL HIGH (ref 70–99)
Potassium: 3.7 mmol/L (ref 3.5–5.1)
Potassium: 3.7 mmol/L (ref 3.5–5.1)
Sodium: 135 mmol/L (ref 135–145)
Sodium: 134 mmol/L — ABNORMAL LOW (ref 135–145)

## 2022-01-23 LAB — SURGICAL PCR SCREEN
MRSA, PCR: NEGATIVE
Staphylococcus aureus: POSITIVE — AB

## 2022-01-23 LAB — HIV ANTIBODY (ROUTINE TESTING W REFLEX): HIV Screen 4th Generation wRfx: NONREACTIVE

## 2022-01-23 MED ORDER — ONDANSETRON 4 MG PO TBDP: 4.0000 mg | ORAL_TABLET | Freq: Four times a day (QID) | ORAL | Status: DC | PRN

## 2022-01-23 MED ORDER — ACETAMINOPHEN 10 MG/ML IV SOLN
1000.0000 mg | Freq: Once | INTRAVENOUS | Status: AC
  Administered 2022-01-23: 1000 mg via INTRAVENOUS
  Filled 2022-01-23: qty 100

## 2022-01-23 MED ORDER — HYDROMORPHONE HCL 1 MG/ML IJ SOLN
1.0000 mg | INTRAMUSCULAR | Status: DC | PRN
  Filled 2022-01-23: qty 1

## 2022-01-23 MED ORDER — MIDAZOLAM HCL 2 MG/2ML IJ SOLN
INTRAMUSCULAR | Status: DC | PRN
  Administered 2022-01-23: 1 mg via INTRAVENOUS

## 2022-01-23 MED ORDER — PANTOPRAZOLE SODIUM 40 MG IV SOLR
40.0000 mg | Freq: Every day | INTRAVENOUS | Status: DC
  Administered 2022-01-23: 40 mg via INTRAVENOUS
  Filled 2022-01-23: qty 10

## 2022-01-23 MED ORDER — DIPHENHYDRAMINE HCL 50 MG/ML IJ SOLN: 12.5000 mg | Freq: Four times a day (QID) | INTRAMUSCULAR | Status: DC | PRN

## 2022-01-23 MED ORDER — METHOCARBAMOL 500 MG PO TABS: 500.0000 mg | ORAL_TABLET | Freq: Four times a day (QID) | ORAL | Status: DC | PRN

## 2022-01-23 MED ORDER — POLYETHYLENE GLYCOL 3350 17 G PO PACK: 17.0000 g | PACK | Freq: Every day | ORAL | Status: DC | PRN

## 2022-01-23 MED ORDER — MIDAZOLAM HCL 2 MG/2ML IJ SOLN
INTRAMUSCULAR | Status: AC | PRN
  Administered 2022-01-23 (×3): 1 mg via INTRAVENOUS

## 2022-01-23 MED ORDER — ALPRAZOLAM 0.25 MG PO TABS
0.5000 mg | ORAL_TABLET | Freq: Two times a day (BID) | ORAL | Status: DC | PRN
  Administered 2022-01-25: 0.5 mg via ORAL
  Filled 2022-01-23: qty 2

## 2022-01-23 MED ORDER — MIDAZOLAM HCL 2 MG/2ML IJ SOLN
INTRAMUSCULAR | Status: AC
  Filled 2022-01-23: qty 4

## 2022-01-23 MED ORDER — NAPROXEN 250 MG PO TABS: 500.0000 mg | ORAL_TABLET | Freq: Two times a day (BID) | ORAL | Status: DC | PRN

## 2022-01-23 MED ORDER — VALACYCLOVIR HCL 500 MG PO TABS
500.0000 mg | ORAL_TABLET | Freq: Once | ORAL | Status: AC
  Administered 2022-01-23: 500 mg via ORAL
  Filled 2022-01-23: qty 1

## 2022-01-23 MED ORDER — METHOCARBAMOL 500 MG PO TABS
500.0000 mg | ORAL_TABLET | Freq: Two times a day (BID) | ORAL | Status: DC | PRN
  Administered 2022-01-25: 500 mg via ORAL
  Filled 2022-01-23: qty 1

## 2022-01-23 MED ORDER — ZOLPIDEM TARTRATE 5 MG PO TABS
5.0000 mg | ORAL_TABLET | Freq: Every evening | ORAL | Status: DC | PRN
  Administered 2022-01-23 – 2022-01-24 (×2): 5 mg via ORAL
  Filled 2022-01-23 (×2): qty 1

## 2022-01-23 MED ORDER — LIDOCAINE HCL (PF) 1 % IJ SOLN
INTRAMUSCULAR | Status: AC
  Filled 2022-01-23: qty 30

## 2022-01-23 MED ORDER — ACETAMINOPHEN 325 MG PO TABS
325.0000 mg | ORAL_TABLET | Freq: Four times a day (QID) | ORAL | Status: DC
  Administered 2022-01-23 – 2022-01-24 (×4): 325 mg via ORAL
  Filled 2022-01-23 (×6): qty 1

## 2022-01-23 MED ORDER — ONDANSETRON HCL 4 MG/2ML IJ SOLN: 4.0000 mg | Freq: Four times a day (QID) | INTRAMUSCULAR | Status: DC | PRN

## 2022-01-23 MED ORDER — DIPHENHYDRAMINE HCL 12.5 MG/5ML PO ELIX: 12.5000 mg | ORAL_SOLUTION | Freq: Four times a day (QID) | ORAL | Status: DC | PRN

## 2022-01-23 MED ORDER — OXYCODONE HCL 5 MG PO TABS
5.0000 mg | ORAL_TABLET | ORAL | Status: DC | PRN
  Administered 2022-01-25: 10 mg via ORAL

## 2022-01-23 MED ORDER — FENTANYL CITRATE (PF) 100 MCG/2ML IJ SOLN
INTRAMUSCULAR | Status: AC
  Filled 2022-01-23: qty 4

## 2022-01-23 MED ORDER — SODIUM CHLORIDE 0.9% FLUSH
5.0000 mL | Freq: Three times a day (TID) | INTRAVENOUS | Status: DC
  Administered 2022-01-23 – 2022-01-25 (×8): 5 mL

## 2022-01-23 MED ORDER — SENNA 8.6 MG PO TABS
1.0000 | ORAL_TABLET | Freq: Two times a day (BID) | ORAL | Status: DC
  Administered 2022-01-23 – 2022-01-24 (×3): 8.6 mg via ORAL
  Filled 2022-01-23 (×4): qty 1

## 2022-01-23 MED ORDER — LORATADINE 10 MG PO TABS
10.0000 mg | ORAL_TABLET | Freq: Every day | ORAL | Status: DC
  Administered 2022-01-23 – 2022-01-24 (×2): 10 mg via ORAL
  Filled 2022-01-23 (×2): qty 1

## 2022-01-23 MED ORDER — ENOXAPARIN SODIUM 40 MG/0.4ML IJ SOSY
40.0000 mg | PREFILLED_SYRINGE | INTRAMUSCULAR | Status: DC
  Administered 2022-01-23: 40 mg via SUBCUTANEOUS
  Filled 2022-01-23 (×2): qty 0.4

## 2022-01-23 MED ORDER — ESCITALOPRAM OXALATE 20 MG PO TABS
20.0000 mg | ORAL_TABLET | Freq: Every day | ORAL | Status: DC
  Administered 2022-01-23 – 2022-01-25 (×3): 20 mg via ORAL
  Filled 2022-01-23 (×3): qty 1

## 2022-01-23 MED ORDER — FENTANYL CITRATE (PF) 100 MCG/2ML IJ SOLN
INTRAMUSCULAR | Status: AC | PRN
  Administered 2022-01-23 (×3): 50 ug via INTRAVENOUS

## 2022-01-23 NOTE — H&P (Signed)
Jenna Cross is an 36 y.o. female.   ?Chief Complaint: Seroma right breast ?HPI: The patient is a 36 year old female who presented to the clinic with a swollen red right breast.  Several months ago she underwent bilateral mastectomies with expander placement.  She then had the expanders put removed and implants placed.  She had some kids sick at the house and was not feeling very well and then started noticing some redness and swelling of the right breast.  She was placed on antibiotics.  We spoke over the weekend and she was doing better.  Then on Monday she said she got much worse and came to the clinic.  She was admitted through the emergency room in order to expedite IV antibiotics. ? ?Past Medical History:  ?Diagnosis Date  ? Anemia   ? pregnancy related  ? Anxiety   ? Anxiety   ? no medication  ? Cancer [facility])   ? breast  ? COVID 09/2020  ? mild case  ? Family history of prostate cancer 07/17/2021  ? History of DVT (deep vein thrombosis)   ? in her neck  ? ? ?Past Surgical History:  ?Procedure Laterality Date  ? BREAST RECONSTRUCTION WITH PLACEMENT OF TISSUE EXPANDER AND FLEX HD (ACELLULAR HYDRATED DERMIS) Bilateral 12/06/2021  ? Procedure: BREAST RECONSTRUCTION WITH PLACEMENT OF TISSUE EXPANDER AND FLEX HD (ACELLULAR HYDRATED DERMIS);  Surgeon: Wallace Going, DO;  Location: [place];  Service: Plastics;  Laterality: Bilateral;  ? BREAST SURGERY    ? cyst removal from right breast   ? CESAREAN SECTION N/A 01/31/2021  ? Procedure: CESAREAN SECTION;  Surgeon: Dian Queen, MD;  Location: [place] LD ORS;  Service: Obstetrics;  Laterality: N/A;  ? CHG RAD GUIDED,PERCUT DRAINAGE,W/CATH PLACE  01/23/2022  ? excision of fibroadenoma of breast    ? PORTACATH PLACEMENT N/A 07/11/2021  ? Procedure: INSERTION PORT-A-CATH;  Surgeon: Rolm Bookbinder, MD;  Location: [place];  Service: General;  Laterality: N/A;  ? RADIOACTIVE SEED GUIDED AXILLARY SENTINEL LYMPH NODE Right 12/06/2021   ? Procedure: RADIOACTIVE SEED GUIDED RIGHT AXILLARY SENTINEL LYMPH NODE EXCISION;  Surgeon: Rolm Bookbinder, MD;  Location: [place];  Service: General;  Laterality: Right;  ? REMOVAL OF BILATERAL TISSUE EXPANDERS WITH PLACEMENT OF BILATERAL BREAST IMPLANTS Bilateral 01/01/2022  ? Procedure: REMOVAL OF BILATERAL TISSUE EXPANDERS WITH PLACEMENT OF BILATERAL BREAST IMPLANTS;  Surgeon: Wallace Going, DO;  Location: [place];  Service: Plastics;  Laterality: Bilateral;  ? SENTINEL NODE BIOPSY Right 12/06/2021  ? Procedure: RIGHT AXILLARY SENTINEL NODE BIOPSY;  Surgeon: Rolm Bookbinder, MD;  Location: [place];  Service: General;  Laterality: Right;  ? TONSILLECTOMY    ? and adenoids  ? TONSILLECTOMY AND ADENOIDECTOMY    ? TOTAL MASTECTOMY Bilateral 12/06/2021  ? Procedure: BILATERAL SKIN SPARING TOTAL MASTECTOMY;  Surgeon: Rolm Bookbinder, MD;  Location: [place];  Service: General;  Laterality: Bilateral;  ? WISDOM TOOTH EXTRACTION    ? ? ?Family History  ?Problem Relation Age of Onset  ? Migraines Mother   ? Lymphoma Paternal Aunt   ?     dx late 54s  ? Prostate cancer Maternal Grandfather   ?     dx after 50  ? ?Social History:  reports that she has never smoked. She has never used smokeless tobacco. She reports that she does not currently use alcohol. She reports that she does not use drugs. ? ?Allergies:  ?Allergies  ?Allergen  Reactions  ? Wound Dressings Other (See Comments)  ?  Honey comb dressing causes redness and inflammation   ? ? ?Medications Prior to Admission  ?Medication Sig Dispense Refill  ? acetaminophen (TYLENOL) 500 MG tablet Take 1,000 mg by mouth every 6 (six) hours as needed for mild pain or headache.    ? escitalopram (LEXAPRO) 20 MG tablet Take 20 mg by mouth every evening.    ? lidocaine-prilocaine (EMLA) cream Apply to affected area once (Patient taking differently: Apply 1 application. topically as needed (for [facility] access). Apply to  affected area once) 30 g 3  ? loratadine (CLARITIN) 10 MG tablet Take 10 mg by mouth daily as needed for allergies.    ? methocarbamol (ROBAXIN) 500 MG tablet Take 1 tablet (500 mg total) by mouth every 12 (twelve) hours as needed for up to 10 days for muscle spasms. 20 tablet 0  ? rivaroxaban (XARELTO) 20 MG TABS tablet Take 1 tablet (20 mg total) by mouth daily with supper. (Patient taking differently: Take 20 mg by mouth daily.) 30 tablet 5  ? sulfamethoxazole-trimethoprim (BACTRIM DS) 800-160 MG tablet Take 1 tablet by mouth 2 (two) times daily for 7 days. (Patient taking differently: Take 1 tablet by mouth 2 (two) times daily. 7 day course) 14 tablet 0  ? triamcinolone (KENALOG) 0.025 % ointment Apply 1 application. topically 2 (two) times daily. 30 g 0  ? valACYclovir (VALTREX) 500 MG tablet Take 500 mg by mouth every evening.    ? ALPRAZolam (XANAX) 0.5 MG tablet Take 0.5 mg by mouth 3 (three) times daily as needed for anxiety. (Patient not taking: Reported on 01/22/2022)    ? diphenoxylate-atropine (LOMOTIL) 2.5-0.025 MG tablet Take 1 tablet by mouth 3 (three) times daily as needed for diarrhea or loose stools. (Patient not taking: Reported on 01/22/2022) 30 tablet 3  ? enoxaparin (LOVENOX) 60 MG/0.6ML injection Inject 0.6 mLs (60 mg total) into the skin every 12 (twelve) hours. (Patient not taking: Reported on 01/22/2022) 6 mL 0  ? magic mouthwash w/lidocaine SOLN Take 5 mLs by mouth 3 (three) times daily as needed for mouth pain. (Patient not taking: Reported on 01/22/2022) 100 mL 0  ? ? ?Results for orders placed or performed during the hospital encounter of 01/22/22 (from the past 48 hour(s))  ?Urinalysis, Routine w reflex microscopic     Status: Abnormal  ? Collection Time: 01/22/22  9:47 AM  ?Result Value Ref Range  ? Color, Urine YELLOW YELLOW  ? APPearance CLEAR CLEAR  ? Specific Gravity, Urine 1.017 1.005 - 1.030  ? pH 5.0 5.0 - 8.0  ? Glucose, UA NEGATIVE NEGATIVE mg/dL  ? Hgb urine dipstick NEGATIVE  NEGATIVE  ? Bilirubin Urine NEGATIVE NEGATIVE  ? Ketones, ur 80 (A) NEGATIVE mg/dL  ? Protein, ur NEGATIVE NEGATIVE mg/dL  ? Nitrite NEGATIVE NEGATIVE  ? Leukocytes,Ua NEGATIVE NEGATIVE  ?  Comment: Performed at [facility], [place] [address], [place], [place] [address]  ?Comprehensive metabolic panel     Status: Abnormal  ? Collection Time: 01/22/22 10:01 AM  ?Result Value Ref Range  ? Sodium 136 135 - 145 mmol/L  ? Potassium 3.3 (L) 3.5 - 5.1 mmol/L  ? Chloride 98 98 - 111 mmol/L  ? CO2 27 22 - 32 mmol/L  ? Glucose, Bld 99 70 - 99 mg/dL  ?  Comment: Glucose reference range applies only to samples taken after fasting for at least 8 hours.  ? BUN 11 6 - 20  mg/dL  ? Creatinine, Ser 0.77 0.44 - 1.00 mg/dL  ? Calcium 9.2 8.9 - 10.3 mg/dL  ? Total Protein 7.2 6.5 - 8.1 g/dL  ? Albumin 3.8 3.5 - 5.0 g/dL  ? AST 11 (L) 15 - 41 U/L  ? ALT 12 0 - 44 U/L  ? Alkaline Phosphatase 75 38 - 126 U/L  ? Total Bilirubin 0.3 0.3 - 1.2 mg/dL  ? GFR, Estimated >60 >60 mL/min  ?  Comment: (NOTE) ?Calculated using the CKD-EPI Creatinine Equation (2021) ?  ? Anion gap 11 5 - 15  ?  Comment: Performed at [facility], [address], [place], [place] [address]  ?Lactic acid, plasma     Status: None  ? Collection Time: 01/22/22 10:01 AM  ?Result Value Ref Range  ? Lactic Acid, Venous 1.2 0.5 - 1.9 mmol/L  ?  Comment: Performed at [facility], [address], [place], [place] [address]  ?CBC with Differential     Status: None  ? Collection Time: 01/22/22 10:01 AM  ?Result Value Ref Range  ? WBC 7.0 4.0 - 10.5 K/uL  ? RBC 4.05 3.87 - 5.11 MIL/uL  ? Hemoglobin 12.8 12.0 - 15.0 g/dL  ? HCT 38.6 36.0 - 46.0 %  ? MCV 95.3 80.0 - 100.0 fL  ? MCH 31.6 26.0 - 34.0 pg  ? MCHC 33.2 30.0 - 36.0 g/dL  ? RDW 12.6 11.5 - 15.5 %  ? Platelets 184 150 - 400 K/uL  ? nRBC 0.0 0.0 - 0.2 %  ? Neutrophils Relative % 65 %  ? Neutro Abs 4.6 1.7 - 7.7 K/uL  ? Lymphocytes Relative 19 %  ? Lymphs Abs 1.3 0.7 - 4.0 K/uL  ?  Monocytes Relative 12 %  ? Monocytes Absolute 0.9 0.1 - 1.0 K/uL  ? Eosinophils Relative 2 %  ? Eosinophils Absolute 0.1 0.0 - 0.5 K/uL  ? Basophils Relative 1 %  ? Basophils Absolute 0.0 0.0 - 0.1 K/uL

## 2022-01-23 NOTE — Progress Notes (Signed)
?PROGRESS NOTE ? ?Jenna Cross  EHU:[id number] DOB: Jan 12, 1986 DOA: 01/22/2022 ?PCP: Dian Queen, MD  ? ?Brief Narrative: ?Patient is a 36 year old female with history of malignant neoplasm of the right breast status post neoadjuvant chemotherapy, bilateral mastectomy with breast reconstruction, history of DVT on Xarelto who presented to the emergency room for right breast redness, swelling, fever.  She was following with plastic surgery as an outpatient and was on oral antibiotic Bactrim which did not help.  She was sent to the emergency department for further evaluation and need for IV antibiotics. ? ?Assessment & Plan: ? ?Principal Problem: ?  Infection of right breast ?Active Problems: ?  Malignant neoplasm of upper-outer quadrant of right breast in female, estrogen receptor positive ([place]) ?  Breast cancer ([place]) ?  History of DVT (deep vein thrombosis) ? ?Right breast infection: Presented with fever, edema and redness of the right breast.  Was following with plastic surgery as an outpatient, was on Bactrim.  Failed antibiotic outpatient therapy.  Plastic surgery following here, planning for washout.  Currently on vancomycin and ceftriaxone.  Follow-up blood cultures.  Wound cultures will be followed. ?Elevated CRP. ? ?History of DVT: On Xarelto.  Currently on hold for surgery ? ?Right breast cancer: Estrogen positive.  Status post neoadjuvant chemotherapy, bilateral mastectomy with breast implant placement.  Follows with oncology, Dr. Lindi Adie ? ? ? ?  ?  ? ?DVT prophylaxis:SCDs Start: 01/23/22 0528 ? ? ?  Code Status: Full Code ? ?Family Communication: None at bedside ? ?Patient status:Inpatient ? ?Patient is from :Home ? ?Anticipated discharge ZC:HYIF ? ?Estimated DC date:after clearance from plastic surgery ? ? ?Consultants: Plastic surgery ? ?Procedures: Plan for I&D ? ?Antimicrobials:  ?Anti-infectives (From admission, onward)  ? ? Start     Dose/Rate Route Frequency Ordered Stop  ? 01/22/22 2300   valACYclovir (VALTREX) tablet 500 mg       ? 500 mg Oral  Once 01/22/22 2214 01/22/22 2229  ? 01/22/22 2200  vancomycin (VANCOREADY) IVPB 750 mg/150 mL       ? 750 mg ?150 mL/hr over 60 Minutes Intravenous Every 12 hours 01/22/22 1055    ? 01/22/22 2000  cefTRIAXone (ROCEPHIN) 2 g in sodium chloride 0.9 % 100 mL IVPB       ? 2 g ?200 mL/hr over 30 Minutes Intravenous Every 24 hours 01/22/22 1918    ? 01/22/22 1030  vancomycin (VANCOCIN) IVPB 1000 mg/200 mL premix       ? 1,000 mg ?200 mL/hr over 60 Minutes Intravenous  Once 01/22/22 1015 01/22/22 1152  ? ?  ? ? ?Subjective: ? ?Patient seen and examined at the bedside this morning.  Hemodynamically stable.  Was febrile last night.  Overall comfortable during my evaluation. ? ?Objective: ?Vitals:  ? 01/23/22 0013 01/23/22 0500 01/23/22 0523 01/23/22 0649  ?BP: 105/70 109/69 106/69   ?Pulse: (!) 107  (!) 129 (!) 108  ?Resp: '18 18 18   '$ ?Temp: 99.2 ?F (37.3 ?C)  (!) 101.1 ?F (38.4 ?C) 99.1 ?F (37.3 ?C)  ?TempSrc: Oral  Oral Oral  ?SpO2: 95% 97% 97%   ?Weight:      ?Height:      ? ? ?Intake/Output Summary (Last 24 hours) at 01/23/2022 0732 ?Last data filed at 01/23/2022 0725 ?Gross per 24 hour  ?Intake 2411.58 ml  ?Output --  ?Net 2411.58 ml  ? ?Filed Weights  ? 01/22/22 0955  ?Weight: 59.9 kg  ? ? ?Examination: ? ?General exam: Overall comfortable, not  in distress ?HEENT: PERRL ?Respiratory system:  no wheezes or crackles  ?Cardiovascular system: S1 & S2 heard, RRR.  ?Gastrointestinal system: Abdomen is nondistended, soft and nontender. ?Central nervous system: Alert and oriented ?Extremities: No edema, no clubbing ,no cyanosis ?Skin: No rashes, no ulcers,no icterus   ?Breast: Erythematous, edematous, tender right breast ? ? ?Data Reviewed: I have personally reviewed following labs and imaging studies ? ?CBC: ?Recent Labs  ?Lab 01/22/22 ?1001  ?WBC 7.0  ?NEUTROABS 4.6  ?HGB 12.8  ?HCT 38.6  ?MCV 95.3  ?PLT 184  ? ?Basic Metabolic Panel: ?Recent Labs  ?Lab 01/22/22 ?1001  01/23/22 ?0114  ?NA 136 135  ?K 3.3* 3.7  ?CL 98 102  ?CO2 27 22  ?GLUCOSE 99 99  ?BUN 11 8  ?CREATININE 0.77 0.71  ?CALCIUM 9.2 8.0*  ? ? ? ?Recent Results (from the past 240 hour(s))  ?Surgical pcr screen     Status: Abnormal  ? Collection Time: 01/23/22  5:40 AM  ? Specimen: Nasal Mucosa; Nasal Swab  ?Result Value Ref Range Status  ? MRSA, PCR NEGATIVE NEGATIVE Final  ? Staphylococcus aureus POSITIVE (A) NEGATIVE Final  ?  Comment: (NOTE) ?The Xpert SA Assay (FDA approved for NASAL specimens in patients 25 ?years of age and older), is one component of a comprehensive ?surveillance program. It is not intended to diagnose infection nor to ?guide or monitor treatment. ?Performed at [facility], [place] [address], [place], [place] ?[address] ?  ?  ? ?Radiology Studies: ?DG Chest Portable 1 View ? ?Result Date: 01/22/2022 ?CLINICAL DATA:  Provided history: Breast implant infection, right. EXAM: PORTABLE CHEST 1 VIEW COMPARISON:  Report from prior chest radiograph 07/11/2021 (images unavailable). FINDINGS: Left chest infusion port catheter with tip projecting at the level of the lower SVC. Heart size within normal limits. No appreciable airspace consolidation. No evidence of pleural effusion or pneumothorax. No acute bony abnormality identified. Surgical clips project in the region of the bilateral chest and right axilla. Thoracic levocurvature. IMPRESSION: No evidence of acute cardiopulmonary abnormality. Electronically Signed   By: Kellie Simmering D.O.   On: 01/22/2022 10:57  ? ?US BREAST LTD UNI RIGHT INC AXILLA ? ?Result Date: 01/22/2022 ?CLINICAL DATA:  Status post mastectomy on 12/06/2021 and implant placement on 01/01/2022. Breast swelling and redness. Evaluate for abscess. EXAM: ULTRASOUND OF THE right BREAST COMPARISON:  None FINDINGS: On physical exam, the breast was reported to be swelling, tender, and erythematous. Targeted ultrasound is performed, showing a fluid collection containing no liver echogenic  debris. The collection measures approximately 5.6 x 4.0 cm and extends from the 9:00 to 11:00 position. No internal vascularity noted within this collection. No significant hyperemia or edema in the surrounding soft tissue. IMPRESSION: Minimally complex collection, likely a seroma. Developing abscess is not excluded. Clinical correlation is recommended. Electronically Signed   By: Anner Crete M.D.   On: 01/22/2022 19:29   ? ?Scheduled Meds: ? acetaminophen  325 mg Oral Q6H  ? pantoprazole (PROTONIX) IV  40 mg Intravenous QHS  ? senna  1 tablet Oral BID  ? ?Continuous Infusions: ? cefTRIAXone (ROCEPHIN)  IV 2 g (01/22/22 1951)  ? dextrose 5 % and 0.45 % NaCl with KCl 20 mEq/L 50 mL/hr at 01/23/22 0725  ? vancomycin 750 mg (01/22/22 2050)  ? ? ? LOS: 1 day  ? ?Shelly Coss, MD ?[facility] ?P4/18/2023, 7:32 AM   ?

## 2022-01-23 NOTE — Consult Note (Signed)
? ?Chief Complaint: ?Right breast abscess. Request is for abscess drain placement ? ?Referring Physician(s): ?Dr. Guadlupe Spanish ? ?Supervising Physician: Jacqulynn Cadet ? ?Patient Status: [facility] - In-pt ? ?History of Present Illness: ?Jenna Cross is a 36 y.o. female Inpatient. History of breast cancer s/p bilateral mastectomy with bilateral implant placements on 3.27.23. On bactrim for redness and irritation to right breast. Presented to the ED at [place] on 4.17.23 with recurrent fevers. US breast from 4.17.23 shows right breast. Team is requesting abscess drain placemen. ? ?Patient alert and laying in bed, calm and comfortable.  Endorses pain (improved) to her right breast with irritation and redness. Denies any fevers, headache, chest pain, SOB, cough, abdominal pain, nausea, vomiting or bleeding. Return precautions and treatment recommendations and follow-up discussed with the patient  who is agreeable with the plan. ? ?Past Medical History:  ?Diagnosis Date  ? Anemia   ? pregnancy related  ? Anxiety   ? Anxiety   ? no medication  ? [facility])   ? breast  ? COVID 09/2020  ? mild case  ? Family history of prostate cancer 07/17/2021  ? History of DVT (deep vein thrombosis)   ? in her neck  ? ? ?Past Surgical History:  ?Procedure Laterality Date  ? BREAST RECONSTRUCTION WITH PLACEMENT OF TISSUE EXPANDER AND FLEX HD (ACELLULAR HYDRATED DERMIS) Bilateral 12/06/2021  ? Procedure: BREAST RECONSTRUCTION WITH PLACEMENT OF TISSUE EXPANDER AND FLEX HD (ACELLULAR HYDRATED DERMIS);  Surgeon: Wallace Going, DO;  Location: [place];  Service: Plastics;  Laterality: Bilateral;  ? BREAST SURGERY    ? cyst removal from right breast   ? CESAREAN SECTION N/A 01/31/2021  ? Procedure: CESAREAN SECTION;  Surgeon: Dian Queen, MD;  Location: [place] [facility];  Service: Obstetrics;  Laterality: N/A;  ? excision of fibroadenoma of breast    ? PORTACATH PLACEMENT N/A 07/11/2021  ? Procedure: INSERTION  PORT-A-CATH;  Surgeon: Rolm Bookbinder, MD;  Location: [place];  Service: General;  Laterality: N/A;  ? RADIOACTIVE SEED GUIDED AXILLARY SENTINEL LYMPH NODE Right 12/06/2021  ? Procedure: RADIOACTIVE SEED GUIDED RIGHT AXILLARY SENTINEL LYMPH NODE EXCISION;  Surgeon: Rolm Bookbinder, MD;  Location: [place];  Service: General;  Laterality: Right;  ? REMOVAL OF BILATERAL TISSUE EXPANDERS WITH PLACEMENT OF BILATERAL BREAST IMPLANTS Bilateral 01/01/2022  ? Procedure: REMOVAL OF BILATERAL TISSUE EXPANDERS WITH PLACEMENT OF BILATERAL BREAST IMPLANTS;  Surgeon: Wallace Going, DO;  Location: [place];  Service: Plastics;  Laterality: Bilateral;  ? SENTINEL NODE BIOPSY Right 12/06/2021  ? Procedure: RIGHT AXILLARY SENTINEL NODE BIOPSY;  Surgeon: Rolm Bookbinder, MD;  Location: [place];  Service: General;  Laterality: Right;  ? TONSILLECTOMY    ? and adenoids  ? TONSILLECTOMY AND ADENOIDECTOMY    ? TOTAL MASTECTOMY Bilateral 12/06/2021  ? Procedure: BILATERAL SKIN SPARING TOTAL MASTECTOMY;  Surgeon: Rolm Bookbinder, MD;  Location: [place];  Service: General;  Laterality: Bilateral;  ? WISDOM TOOTH EXTRACTION    ? ? ?Allergies: ?Wound dressings ? ?Medications: ?Prior to Admission medications   ?Medication Sig Start Date End Date Taking? Authorizing Provider  ?acetaminophen (TYLENOL) 500 MG tablet Take 1,000 mg by mouth every 6 (six) hours as needed for mild pain or headache. 01/03/20  Yes [provider]  ?escitalopram (LEXAPRO) 20 MG tablet Take 20 mg by mouth every evening. 06/08/21  Yes [provider]  ?lidocaine-prilocaine (EMLA) cream Apply to affected area once ?Patient taking  differently: Apply 1 application. topically as needed (for [facility] access). Apply to affected area once 12/20/21  Yes Nicholas Lose, MD  ?loratadine (CLARITIN) 10 MG tablet Take 10 mg by mouth daily as needed for allergies. 12/06/21  Yes [provider]  ?methocarbamol (ROBAXIN) 500 MG tablet Take 1 tablet (500 mg total) by mouth every 12 (twelve) hours as needed for up to 10 days for muscle spasms. 01/18/22 01/28/22 Yes Scheeler, Carola Rhine, PA-C  ?rivaroxaban (XARELTO) 20 MG TABS tablet Take 1 tablet (20 mg total) by mouth daily with supper. ?Patient taking differently: Take 20 mg by mouth daily. 10/26/21  Yes Nicholas Lose, MD  ?sulfamethoxazole-trimethoprim (BACTRIM DS) 800-160 MG tablet Take 1 tablet by mouth 2 (two) times daily for 7 days. ?Patient taking differently: Take 1 tablet by mouth 2 (two) times daily. 7 day course 01/18/22 01/25/22 Yes Scheeler, Carola Rhine, PA-C  ?triamcinolone (KENALOG) 0.025 % ointment Apply 1 application. topically 2 (two) times daily. 01/09/22  Yes Scheeler, Carola Rhine, PA-C  ?valACYclovir (VALTREX) 500 MG tablet Take 500 mg by mouth every evening. 10/08/18  Yes [provider]  ?ALPRAZolam Duanne Moron) 0.5 MG tablet Take 0.5 mg by mouth 3 (three) times daily as needed for anxiety. ?Patient not taking: Reported on 01/22/2022 10/08/18   [provider]  ?diphenoxylate-atropine (LOMOTIL) 2.5-0.025 MG tablet Take 1 tablet by mouth 3 (three) times daily as needed for diarrhea or loose stools. ?Patient not taking: Reported on 01/22/2022 08/02/21   Nicholas Lose, MD  ?enoxaparin (LOVENOX) 60 MG/0.6ML injection Inject 0.6 mLs (60 mg total) into the skin every 12 (twelve) hours. ?Patient not taking: Reported on 01/22/2022 12/20/21   Nicholas Lose, MD  ?magic mouthwash w/lidocaine SOLN Take 5 mLs by mouth 3 (three) times daily as needed for mouth pain. ?Patient not taking: Reported on 01/22/2022 07/19/21   Nicholas Lose, MD  ?prochlorperazine (COMPAZINE) 10 MG tablet Take 1 tablet (10 mg total) by mouth every 6 (six) hours as needed (Nausea or vomiting). 06/29/21 12/20/21  Nicholas Lose, MD  ?  ? ?Family History  ?Problem Relation Age of Onset  ? Migraines Mother   ? Lymphoma Paternal Aunt   ?     dx late 76s  ? Prostate cancer Maternal  Grandfather   ?     dx after 50  ? ? ?Social History  ? ?Socioeconomic History  ? Marital status: Married  ?  Spouse name: Not on file  ? Number of children: 0  ? Years of education: College  ? Highest education level: Not on file  ?Occupational History  ? Not on file  ?Tobacco Use  ? Smoking status: Never  ? Smokeless tobacco: Never  ?Vaping Use  ? Vaping Use: Never used  ?Substance and Sexual Activity  ? Alcohol use: Not Currently  ? Drug use: Never  ? Sexual activity: Yes  ?  Birth control/protection: None  ?Other Topics Concern  ? Not on file  ?Social History Narrative  ? ** Merged History Encounter **  ?    ? Lives at home ?Right handed ?Drinks 1 cup of coffee daily and a couple of sodas per month  ? ?Social Determinants of Health  ? ?Financial Resource Strain: Not on file  ?Food Insecurity: Not on file  ?Transportation Needs: Not on file  ?Physical Activity: Not on file  ?Stress: Not on file  ?Social Connections: Not on file  ? ? ?Review of Systems: A 12 point ROS discussed and pertinent positives are indicated  in the HPI above.  All other systems are negative. ? ?Review of Systems  ?Constitutional:  Negative for fatigue and fever.  ?HENT:  Negative for congestion.   ?Respiratory:  Negative for cough and shortness of breath.   ?Gastrointestinal:  Negative for abdominal pain, diarrhea, nausea and vomiting.  ? ?Vital Signs: ?BP 108/71 (BP Location: Left Wrist)   Pulse (!) 102   Temp 98.4 ?F (36.9 ?C) (Oral)   Resp 17   Ht '5\' 4"'$  (1.626 m)   Wt 132 lb (59.9 kg)   SpO2 95%   BMI 22.66 kg/m?  ? ?Physical Exam ?Vitals and nursing note reviewed.  ?Constitutional:   ?   Appearance: She is well-developed.  ?HENT:  ?   Head: Normocephalic and atraumatic.  ?Eyes:  ?   Conjunctiva/sclera: Conjunctivae normal.  ?Cardiovascular:  ?   Rate and Rhythm: Normal rate and regular rhythm.  ?   Heart sounds: Normal heart sounds.  ?Pulmonary:  ?   Effort: Pulmonary effort is normal.  ?   Breath sounds: Normal breath sounds.   ?Musculoskeletal:  ?   Cervical back: Normal range of motion.  ?Neurological:  ?   Mental Status: She is alert and oriented to person, place, and time.  ? ? ?Imaging: ?DG Chest Portable 1 View ? ?Result Date: 01/22/2022

## 2022-01-23 NOTE — Procedures (Signed)
Interventional Radiology Procedure Note ? ?Procedure: Placement of a 31F drain modified with additional sideholes into right breast complex fluid collection yielding 325 mL purulent fluid.  ? ?Complications: None ? ?Estimated Blood Loss: None ? ?Recommendations: ?- Abscess cultures sent ?- Drain left in place ?- Flush Q shift ? ? ? ?Signed, ? ?Criselda Peaches, MD ? ? ?

## 2022-01-23 NOTE — Progress Notes (Signed)
? ?  Subjective:  ? ? Patient ID: Jenna Cross, female    DOB: 07-23-1986, 36 y.o.   MRN: [id number] ? ?The patient is a 36 yrs old female here for treatment of a right breast seroma / infection.  It started a few days ago and was getting better.  She then got dramatically worse on Monday morning.  She was seen in the clinic and admitted through the ED for treatment and IV antibiotics.  We plan for U/S guided drainage today if possible. The breast is red and swollen. ? ? ? ? ?Review of Systems  ?Constitutional:  Positive for activity change, chills, fatigue and fever.  ?Eyes: Negative.   ?Respiratory:  Negative for chest tightness and shortness of breath.   ?Cardiovascular:  Negative for leg swelling.  ?Gastrointestinal:  Negative for abdominal distention.  ?Endocrine: Negative.   ?Genitourinary: Negative.   ?Musculoskeletal: Negative.   ?Skin:  Positive for color change and wound.  ? ?   ?Objective:  ? Physical Exam ?Vitals reviewed.  ?Cardiovascular:  ?   Rate and Rhythm: Normal rate.  ?   Pulses: Normal pulses.  ?Pulmonary:  ?   Effort: Pulmonary effort is normal.  ?Skin: ?   General: Skin is warm.  ?   Capillary Refill: Capillary refill takes less than 2 seconds.  ?   Coloration: Skin is not jaundiced or pale.  ?   Findings: Erythema present.  ?Neurological:  ?   Mental Status: She is alert and oriented to person, place, and time.  ?Psychiatric:     ?   Mood and Affect: Mood normal.     ?   Behavior: Behavior normal.     ?   Thought Content: Thought content normal.  ? ? ?   ?Assessment & Plan:  ? ?  ICD-10-CM   ?1. Cellulitis, unspecified cellulitis site  L03.90   ?  ?2. Breast infection  N61.0   ?  ?3. Hypokalemia  E87.6   ?  ?  ?Spoke with radiology and they are planning to see her today.  She will remain NPO until they see her. ? ?

## 2022-01-23 NOTE — Progress Notes (Addendum)
?Subjective: ?36 year old female with history of bilateral mastectomies with bilateral breast reconstruction with prepectoral placement of bilateral breast implants status post expansion with expanders. ? ?Had drain placed by radiology today, 325 cc of purulent fluid was collected.  Sent for cultures. ? ?Patient reports she feels much better after having drain placed.  She reports redness significantly worsened over the weekend, specifically Sunday. ? ?Objective: ?Vital signs in last 24 hours: ?Temp:  [97.8 ?F (36.6 ?C)-101.1 ?F (38.4 ?C)] 97.8 ?F (36.6 ?C) (04/18 1520) ?Pulse Rate:  [97-129] 97 (04/18 1520) ?Resp:  [9-20] 17 (04/18 1520) ?BP: (98-123)/(64-82) 109/77 (04/18 1520) ?SpO2:  [95 %-100 %] 97 % (04/18 1520) ?  ? ?Intake/Output from previous day: ?04/17 0701 - 04/18 0700 ?In: 2041 [P.O.:242; I.V.:250; IV Piggyback:1549] ?Out: -  ?Intake/Output this shift: ?Total I/O ?In: 370.6 [I.V.:370.6] ?Out: 75 [Drains:70] ? ?General appearance: alert, cooperative, and no distress ?Breasts: Right breast with significant erythema, drain in place.  Approximately 20 cc of cloudy serosanguineous drainage noted.  Right breast incision is intact. ? ?Lab Results:  ? ?  Latest Ref Rng & Units 01/22/2022  ? 10:01 AM 12/29/2021  ?  8:55 AM 11/16/2021  ?  8:10 AM  ?CBC  ?WBC 4.0 - 10.5 K/uL 7.0   5.9   3.2    ?Hemoglobin 12.0 - 15.0 g/dL 12.8   12.3   9.7    ?Hematocrit 36.0 - 46.0 % 38.6   37.2   28.8    ?Platelets 150 - 400 K/uL 184   206   134    ?  ?BMET ?Recent Labs  ?  01/23/22 ?0114 01/23/22 ?1120  ?NA 135 134*  ?K 3.7 3.7  ?CL 102 102  ?CO2 22 23  ?GLUCOSE 99 112*  ?BUN 8 7  ?CREATININE 0.71 0.74  ?CALCIUM 8.0* 8.0*  ? ?PT/INR ?Recent Labs  ?  01/22/22 ?1001  ?LABPROT 17.8*  ?INR 1.5*  ? ?ABG ?No results for input(s): PHART, HCO3 in the last 72 hours. ? ?Invalid input(s): PCO2, PO2 ? ?Studies/Results: ?DG Chest Portable 1 View ? ?Result Date: 01/22/2022 ?CLINICAL DATA:  Provided history: Breast implant infection, right.  EXAM: PORTABLE CHEST 1 VIEW COMPARISON:  Report from prior chest radiograph 07/11/2021 (images unavailable). FINDINGS: Left chest infusion port catheter with tip projecting at the level of the lower SVC. Heart size within normal limits. No appreciable airspace consolidation. No evidence of pleural effusion or pneumothorax. No acute bony abnormality identified. Surgical clips project in the region of the bilateral chest and right axilla. Thoracic levocurvature. IMPRESSION: No evidence of acute cardiopulmonary abnormality. Electronically Signed   By: Kellie Simmering D.O.   On: 01/22/2022 10:57  ? ?US BREAST LTD UNI RIGHT INC AXILLA ? ?Result Date: 01/22/2022 ?CLINICAL DATA:  Status post mastectomy on 12/06/2021 and implant placement on 01/01/2022. Breast swelling and redness. Evaluate for abscess. EXAM: ULTRASOUND OF THE right BREAST COMPARISON:  None FINDINGS: On physical exam, the breast was reported to be swelling, tender, and erythematous. Targeted ultrasound is performed, showing a fluid collection containing no liver echogenic debris. The collection measures approximately 5.6 x 4.0 cm and extends from the 9:00 to 11:00 position. No internal vascularity noted within this collection. No significant hyperemia or edema in the surrounding soft tissue. IMPRESSION: Minimally complex collection, likely a seroma. Developing abscess is not excluded. Clinical correlation is recommended. Electronically Signed   By: Anner Crete M.D.   On: 01/22/2022 19:29  ? ?[place] IMAGE GUIDED DRAINAGE BY PERCUTANEOUS CATHETER ? ?  Result Date: 01/23/2022 ?INDICATION: 36 year old female with a history of breast cancer status post bilateral mastectomy and reconstruction. She now has redness, induration and a large complex fluid collection surrounding the right reconstruction implant concerning for possible breast abscess. She presents for ultrasound-guided drain placement. EXAM: [place] IMAGE GUIDED DRAINAGE BY PERCUTANEOUS CATHETE MEDICATIONS: The  patient is currently admitted to the hospital and receiving intravenous antibiotics. The antibiotics were administered within an appropriate time frame prior to the initiation of the procedure. ANESTHESIA/SEDATION: Fentanyl 4 mcg IV; Versed 150 mg IV Moderate Sedation Time:  15 minutes The patient was continuously monitored during the procedure by the interventional radiology nurse under my direct supervision. COMPLICATIONS: None immediate. PROCEDURE: Informed written consent was obtained from the patient after a thorough discussion of the procedural risks, benefits and alternatives. All questions were addressed. Maximal Sterile Barrier Technique was utilized including caps, mask, sterile gowns, sterile gloves, sterile drape, hand hygiene and skin antiseptic. A timeout was performed prior to the initiation of the procedure. The right breast was interrogated with ultrasound. A large complex fluid collection is visualized surrounding the implant prosthesis. A suitable skin entry site at the superior margin of the implant was selected. Local anesthesia was attained by infiltration with 1% lidocaine. A small dermatotomy was made. Under real-time ultrasound guidance, an 18 gauge trocar needle was carefully advanced through the overlying tissue and into the fluid collection. Great care was taken to avoid the prosthesis. The needle was then navigated into the more dependent portion of the fluid collection. A 0.035 wire was then coiled in the collection. The skin tract was dilated to 12 [place]. A 12 French all-purpose drainage catheter modified with additional sideholes was selected and advanced over the wire. Aspiration was then performed yielding approximately 325 mL of purulent pink opaque fluid. Samples were sent for Gram stain and culture. The drainage catheter was gently flushed and connected to JP bulb suction before being secured to the skin with 0 Prolene suture. IMPRESSION: 1. Successful placement of 12 French  drainage catheter. Aspiration yields 325 mL purulent fluid. Samples were sent for Gram stain and culture. Electronically Signed   By: Jacqulynn Cadet M.D.   On: 01/23/2022 13:24  ? ?US BREAST ASPIRATION RIGHT ? ?Result Date: 01/23/2022 ?INDICATION: 36 year old female with a history of breast cancer status post bilateral mastectomy and reconstruction. She now has redness, induration and a large complex fluid collection surrounding the right reconstruction implant concerning for possible breast abscess. She presents for ultrasound-guided drain placement. EXAM: US BREAST ASPIRATION MEDICATIONS: The patient is currently admitted to the hospital and receiving intravenous antibiotics. The antibiotics were administered within an appropriate time frame prior to the initiation of the procedure. ANESTHESIA/SEDATION: Fentanyl 4 mcg IV; Versed 150 mg IV Moderate Sedation Time:  15 minutes The patient was continuously monitored during the procedure by the interventional radiology nurse under my direct supervision. COMPLICATIONS: None immediate. PROCEDURE: Informed written consent was obtained from the patient after a thorough discussion of the procedural risks, benefits and alternatives. All questions were addressed. Maximal Sterile Barrier Technique was utilized including caps, mask, sterile gowns, sterile gloves, sterile drape, hand hygiene and skin antiseptic. A timeout was performed prior to the initiation of the procedure. The right breast was interrogated with ultrasound. A large complex fluid collection is visualized surrounding the implant prosthesis. A suitable skin entry site at the superior margin of the implant was selected. Local anesthesia was attained by infiltration with 1% lidocaine. A small dermatotomy was made. Under real-time  ultrasound guidance, an 18 gauge trocar needle was carefully advanced through the overlying tissue and into the fluid collection. Great care was taken to avoid the prosthesis. The  needle was then navigated into the more dependent portion of the fluid collection. A 0.035 wire was then coiled in the collection. The skin tract was dilated to 12 [place]. A 12 [place] all-purpose drainage catheter

## 2022-01-23 NOTE — Progress Notes (Signed)
?   01/23/22 0523  ?Assess: MEWS Score  ?Temp (!) 101.1 ?F (38.4 ?C)  ?BP 106/69  ?Pulse Rate (!) 129  ?Resp 18  ?SpO2 97 %  ?O2 Device Room Air  ?Assess: MEWS Score  ?MEWS Temp 1  ?MEWS Systolic 0  ?MEWS Pulse 2  ?MEWS RR 0  ?MEWS LOC 0  ?MEWS Score 3  ?MEWS Score Color Yellow  ?Assess: if the MEWS score is Yellow or Red  ?Were vital signs taken at a resting state? Yes  ?Focused Assessment Change from prior assessment (see assessment flowsheet)  ?Early Detection of Sepsis Score *See Row Information* Low  ?MEWS guidelines implemented *See Row Information* Yes  ?Treat  ?MEWS Interventions Escalated (See documentation below)  ?Take Vital Signs  ?Increase Vital Sign Frequency  Yellow: Q 2hr X 2 then Q 4hr X 2, if remains yellow, continue Q 4hrs  ?Escalate  ?MEWS: Escalate Yellow: discuss with charge nurse/RN and consider discussing with provider and RRT  ?Notify: Charge Nurse/RN  ?Name of Charge Nurse/RN Notified [place]  ?Date Charge Nurse/RN Notified 01/23/22  ?Time Charge Nurse/RN Notified [phone]  ?Notify: Provider  ?Provider Name/Title Jamison Neighbor  ?Date Provider Notified 01/23/22  ?Time Provider Notified [phone]  ?Notification Type  ?(secure chat)  ?Notification Reason Change in status  ?Date of Provider Response 01/23/22  ?Time of Provider Response 0532 ?(ordered IV Acetaminophen)  ?Document  ?Patient Outcome Stabilized after interventions ?(at 0649 am  Temp 99.1 PR 108)  ? ? ?

## 2022-01-23 NOTE — TOC Progression Note (Signed)
Transition of Care (TOC) - Progression Note  ? ? ?Patient Details  ?Name: Jenna Cross ?MRN: [id number] ?Date of Birth: [birth year] ? ?Transition of Care (TOC) CM/SW Contact  ?Marilu Favre, RN ?Phone Number: ?01/23/2022, 8:23 AM ? ?Clinical Narrative:    ? ? ?Transition of Care (TOC) Screening Note ? ? ?Patient Details  ?Name: Jenna Cross ?Date of Birth: 1986/04/14 ? ? ? ?Transition of Care Department [facility]) has reviewed patient and no TOC needs have been identified at this time. We will continue to monitor patient advancement through interdisciplinary progression rounds. If new patient transition needs arise, please place a TOC consult. ?  ? ?  ?  ? ?Expected Discharge Plan and Services ?  ?  ?  ?  ?  ?                ?  ?  ?  ?  ?  ?  ?  ?  ?  ?  ? ? ?Social Determinants of Health (SDOH) Interventions ?  ? ?Readmission Risk Interventions ?   ? View : No data to display.  ?  ?  ?  ? ? ?

## 2022-01-24 ENCOUNTER — Ambulatory Visit: Payer: 59

## 2022-01-24 ENCOUNTER — Encounter: Payer: Self-pay | Admitting: *Deleted

## 2022-01-24 DIAGNOSIS — N61 Mastitis without abscess: Secondary | ICD-10-CM | POA: Diagnosis not present

## 2022-01-24 LAB — CBC
HCT: 35 % — ABNORMAL LOW (ref 36.0–46.0)
Hemoglobin: 11.1 g/dL — ABNORMAL LOW (ref 12.0–15.0)
MCH: 31.3 pg (ref 26.0–34.0)
MCHC: 31.7 g/dL (ref 30.0–36.0)
MCV: 98.6 fL (ref 80.0–100.0)
Platelets: 195 10*3/uL (ref 150–400)
RBC: 3.55 MIL/uL — ABNORMAL LOW (ref 3.87–5.11)
RDW: 12.6 % (ref 11.5–15.5)
WBC: 8.9 10*3/uL (ref 4.0–10.5)
nRBC: 0.2 % (ref 0.0–0.2)

## 2022-01-24 MED ORDER — ENOXAPARIN SODIUM 30 MG/0.3ML IJ SOSY
30.0000 mg | PREFILLED_SYRINGE | INTRAMUSCULAR | Status: DC
  Administered 2022-01-24 – 2022-01-25 (×2): 30 mg via SUBCUTANEOUS
  Filled 2022-01-24 (×3): qty 0.3

## 2022-01-24 MED ORDER — PANTOPRAZOLE SODIUM 40 MG PO TBEC
40.0000 mg | DELAYED_RELEASE_TABLET | Freq: Every day | ORAL | Status: DC
  Administered 2022-01-24 – 2022-01-25 (×2): 40 mg via ORAL
  Filled 2022-01-24 (×2): qty 1

## 2022-01-24 MED ORDER — ACETAMINOPHEN 10 MG/ML IV SOLN
1000.0000 mg | Freq: Once | INTRAVENOUS | Status: AC
  Administered 2022-01-24: 1000 mg via INTRAVENOUS
  Filled 2022-01-24: qty 100

## 2022-01-24 MED ORDER — VALACYCLOVIR HCL 500 MG PO TABS
500.0000 mg | ORAL_TABLET | Freq: Once | ORAL | Status: AC
  Administered 2022-01-24: 500 mg via ORAL
  Filled 2022-01-24: qty 1

## 2022-01-24 NOTE — Progress Notes (Signed)
? ?  Subjective:  ? ? Patient ID: Jenna Cross, female    DOB: [birth year], 36 y.o.   MRN: [id number] ? ?The patient is doing much better today.  Overall feels better.  The breast is much improved with decrease in swelling and redness.  There is a blister on the lower lateral poll.   ? ? ? ? ?Review of Systems  ?Constitutional: Negative.   ?HENT: Negative.    ?Eyes: Negative.   ?Respiratory: Negative.    ?Cardiovascular:  Negative for chest pain.  ?Genitourinary: Negative.   ? ?   ?Objective:  ? Physical Exam ?Constitutional:   ?   Appearance: Normal appearance.  ?Cardiovascular:  ?   Rate and Rhythm: Normal rate.  ?   Pulses: Normal pulses.  ?Musculoskeletal:     ?   General: Swelling present.  ?Skin: ?   Capillary Refill: Capillary refill takes less than 2 seconds.  ?   Coloration: Skin is not jaundiced.  ?   Findings: Erythema present.  ?Neurological:  ?   Mental Status: She is alert and oriented to person, place, and time.  ?Psychiatric:     ?   Behavior: Behavior normal.     ?   Thought Content: Thought content normal.     ?   Judgment: Judgment normal.  ? ? ?   ?Assessment & Plan:  ? ?  ICD-10-CM   ?1. Cellulitis, unspecified cellulitis site  L03.90   ?  ?2. Breast infection  N61.0   ?  ?3. Hypokalemia  E87.6   ?  ?  ?Plan continued IV antibiotics.  Spoke with Dr. Lindi Adie regarding progress, plan and radiation.  He agrees with getting the patient through this situation.  She can eat today and will re-evaluate in the morning for OR. ? ?

## 2022-01-24 NOTE — H&P (View-Only) (Signed)
? ?  Subjective:  ? ? Patient ID: Jenna Cross, female    DOB: 12-08-1985, 36 y.o.   MRN: [id number] ? ?The patient is doing much better today.  Overall feels better.  The breast is much improved with decrease in swelling and redness.  There is a blister on the lower lateral poll.   ? ? ? ? ?Review of Systems  ?Constitutional: Negative.   ?HENT: Negative.    ?Eyes: Negative.   ?Respiratory: Negative.    ?Cardiovascular:  Negative for chest pain.  ?Genitourinary: Negative.   ? ?   ?Objective:  ? Physical Exam ?Constitutional:   ?   Appearance: Normal appearance.  ?Cardiovascular:  ?   Rate and Rhythm: Normal rate.  ?   Pulses: Normal pulses.  ?Musculoskeletal:     ?   General: Swelling present.  ?Skin: ?   Capillary Refill: Capillary refill takes less than 2 seconds.  ?   Coloration: Skin is not jaundiced.  ?   Findings: Erythema present.  ?Neurological:  ?   Mental Status: She is alert and oriented to person, place, and time.  ?Psychiatric:     ?   Behavior: Behavior normal.     ?   Thought Content: Thought content normal.     ?   Judgment: Judgment normal.  ? ? ?   ?Assessment & Plan:  ? ?  ICD-10-CM   ?1. Cellulitis, unspecified cellulitis site  L03.90   ?  ?2. Breast infection  N61.0   ?  ?3. Hypokalemia  E87.6   ?  ?  ?Plan continued IV antibiotics.  Spoke with Dr. Lindi Adie regarding progress, plan and radiation.  He agrees with getting the patient through this situation.  She can eat today and will re-evaluate in the morning for OR. ? ?

## 2022-01-24 NOTE — Progress Notes (Signed)
?PROGRESS NOTE ? ?Jenna Cross  WUJ:[id number] DOB: Oct 16, 1985 DOA: 01/22/2022 ?PCP: Dian Queen, MD  ? ?Brief Narrative: ?Patient is a 36 year old female with history of malignant neoplasm of the right breast status post neoadjuvant chemotherapy, bilateral mastectomy with breast reconstruction, history of DVT on Xarelto who presented to the emergency room for right breast redness, swelling, fever.  She was following with plastic surgery as an outpatient and was on oral antibiotic Bactrim which did not help.  She was sent to the emergency department for further evaluation and need for IV antibiotics.  She underwent IR guided drain placement for right breast abscess, plastic surgery considering I&D/they will decide tomorrow. ? ?Assessment & Plan: ? ?Principal Problem: ?  Infection of right breast ?Active Problems: ?  Malignant neoplasm of upper-outer quadrant of right breast in female, estrogen receptor positive ([place]) ?  Breast cancer ([place]) ?  History of DVT (deep vein thrombosis) ? ?Right breast infection: Presented with fever, edema and redness of the right breast.  Was following with plastic surgery as an outpatient, was on Bactrim.  Failed antibiotic outpatient therapy.   Currently on vancomycin and ceftriaxone.  Follow-up blood cultures.  Wound cultures will be followed: Showing abundant gram-positive cocci in clusters likely staph ?Elevated CRP. ?She underwent IR guided drain placement on right breast on 4/18.  Plastic surgery considering I&D/washout ? ?History of DVT: On Xarelto.  Currently on hold for surgery ? ?Right breast cancer: Estrogen positive.  Status post neoadjuvant chemotherapy, bilateral mastectomy with breast implant placement.  Follows with oncology, Dr. Lindi Adie ? ? ? ?  ?  ? ?DVT prophylaxis:enoxaparin (LOVENOX) injection 30 mg Start: 01/24/22 1200 ?enoxaparin (LOVENOX) injection 40 mg Start: 01/23/22 2000 ?SCDs Start: 01/23/22 0528 ? ? ?  Code Status: Full Code ? ?Family  Communication: None at bedside ? ?Patient status:Inpatient ? ?Patient is from :Home ? ?Anticipated discharge NF:AOZH ? ?Estimated DC date:after clearance from plastic surgery ? ? ?Consultants: Plastic surgery ? ?Procedures: Plan for I&D ? ?Antimicrobials:  ?Anti-infectives (From admission, onward)  ? ? Start     Dose/Rate Route Frequency Ordered Stop  ? 01/23/22 2100  valACYclovir (VALTREX) tablet 500 mg       ? 500 mg Oral  Once 01/23/22 2004 01/23/22 2146  ? 01/22/22 2300  valACYclovir (VALTREX) tablet 500 mg       ? 500 mg Oral  Once 01/22/22 2214 01/22/22 2229  ? 01/22/22 2200  vancomycin (VANCOREADY) IVPB 750 mg/150 mL       ? 750 mg ?150 mL/hr over 60 Minutes Intravenous Every 12 hours 01/22/22 1055    ? 01/22/22 2000  cefTRIAXone (ROCEPHIN) 2 g in sodium chloride 0.9 % 100 mL IVPB       ? 2 g ?200 mL/hr over 30 Minutes Intravenous Every 24 hours 01/22/22 1918    ? 01/22/22 1030  vancomycin (VANCOCIN) IVPB 1000 mg/200 mL premix       ? 1,000 mg ?200 mL/hr over 60 Minutes Intravenous  Once 01/22/22 1015 01/22/22 1152  ? ?  ? ? ?Subjective: ? ?Patient seen and examined at the bedside this morning.  Hemodynamically stable, overall comfortable, no report of worsening pain or discomfort. ? ?Objective: ?Vitals:  ? 01/23/22 1520 01/23/22 2150 01/24/22 0628 01/24/22 0714  ?BP: 109/77 113/76 110/73 110/69  ?Pulse: 97 (!) 105 74 80  ?Resp: '17 18 18 15  '$ ?Temp: 97.8 ?F (36.6 ?C) 98.2 ?F (36.8 ?C) 98.5 ?F (36.9 ?C) 98.2 ?F (36.8 ?C)  ?TempSrc: Oral  Oral Oral Oral  ?SpO2: 97% 100% 98% 95%  ?Weight:      ?Height:      ? ? ?Intake/Output Summary (Last 24 hours) at 01/24/2022 1119 ?Last data filed at 01/24/2022 1006 ?Gross per 24 hour  ?Intake 1609.38 ml  ?Output 170 ml  ?Net 1439.38 ml  ? ?Filed Weights  ? 01/22/22 0955  ?Weight: 59.9 kg  ? ? ?Examination: ? ?General exam: Overall comfortable, not in distress ?HEENT: PERRL ?Respiratory system:  no wheezes or crackles  ?Cardiovascular system: S1 & S2 heard, RRR.   ?Gastrointestinal system: Abdomen is nondistended, soft and nontender. ?Central nervous system: Alert and oriented ?Extremities: No edema, no clubbing ,no cyanosis ?Skin: No rashes, no ulcers,no icterus   ?Breast: Erythematous right breast with a drain with sanguinous output ? ? ?Data Reviewed: I have personally reviewed following labs and imaging studies ? ?CBC: ?Recent Labs  ?Lab 01/22/22 ?1001 01/24/22 ?0040  ?WBC 7.0 8.9  ?NEUTROABS 4.6  --   ?HGB 12.8 11.1*  ?HCT 38.6 35.0*  ?MCV 95.3 98.6  ?PLT 184 195  ? ?Basic Metabolic Panel: ?Recent Labs  ?Lab 01/22/22 ?1001 01/23/22 ?0114 01/23/22 ?1120  ?NA 136 135 134*  ?K 3.3* 3.7 3.7  ?CL 98 102 102  ?CO2 '27 22 23  '$ ?GLUCOSE 99 99 112*  ?BUN '11 8 7  '$ ?CREATININE 0.77 0.71 0.74  ?CALCIUM 9.2 8.0* 8.0*  ? ? ? ?Recent Results (from the past 240 hour(s))  ?Culture, blood (Routine x 2)     Status: None (Preliminary result)  ? Collection Time: 01/22/22 10:01 AM  ? Specimen: Left Antecubital; Blood  ?Result Value Ref Range Status  ? Specimen Description   Final  ?  LEFT ANTECUBITAL ?Performed at [facility], [address], [place], [place] [address] ?  ? Special Requests   Final  ?  BOTTLES DRAWN AEROBIC AND ANAEROBIC Blood Culture adequate volume ?Performed at [facility], [address], [place], [place] [address] ?  ? Culture   Final  ?  NO GROWTH 2 DAYS ?Performed at [facility], [place] [address] [place], [place] [address] ?  ? Report Status PENDING  Incomplete  ?Culture, blood (Routine x 2)     Status: None (Preliminary result)  ? Collection Time: 01/22/22 10:20 AM  ? Specimen: BLOOD LEFT WRIST  ?Result Value Ref Range Status  ? Specimen Description   Final  ?  BLOOD LEFT WRIST ?Performed at [facility], [address] [place], [place] [address] ?  ? Special Requests   Final  ?  BOTTLES DRAWN AEROBIC AND ANAEROBIC Blood Culture results may not be optimal due to an inadequate volume of blood received  in culture bottles ?Performed at [facility], [address], [place], [place] [address] ?  ? Culture   Final  ?  NO GROWTH 2 DAYS ?Performed at [facility], [place] [address], [place], [place] [address] ?  ? Report Status PENDING  Incomplete  ?Surgical pcr screen     Status: Abnormal  ? Collection Time: 01/23/22  5:40 AM  ? Specimen: Nasal Mucosa; Nasal Swab  ?Result Value Ref Range Status  ? MRSA, PCR NEGATIVE NEGATIVE Final  ? Staphylococcus aureus POSITIVE (A) NEGATIVE Final  ?  Comment: (NOTE) ?The Xpert SA Assay (FDA approved for NASAL specimens in patients 59 ?years of age and older), is one component of a comprehensive ?surveillance program. It is not intended to diagnose infection nor to ?  guide or monitor treatment. ?Performed at [facility], [place] [address], [place], [place] ?[address] ?  ?Aerobic/Anaerobic Culture w Gram Stain (surgical/deep wound)     Status: None (Preliminary result)  ? Collection Time: 01/23/22 11:26 AM  ? Specimen: Abscess  ?Result Value Ref Range Status  ? Specimen Description ABSCESS  Final  ? Special Requests Normal  Final  ? Gram Stain   Final  ?  ABUNDANT WBC PRESENT, PREDOMINANTLY PMN ?ABUNDANT GRAM POSITIVE COCCI IN CLUSTERS ?  ? Culture   Final  ?  CULTURE REINCUBATED FOR BETTER GROWTH ?Performed at [facility], [place] [address], [place], [place] [address] ?  ? Report Status PENDING  Incomplete  ?  ? ?Radiology Studies: ?US BREAST LTD UNI RIGHT INC AXILLA ? ?Result Date: 01/22/2022 ?CLINICAL DATA:  Status post mastectomy on 12/06/2021 and implant placement on 01/01/2022. Breast swelling and redness. Evaluate for abscess. EXAM: ULTRASOUND OF THE right BREAST COMPARISON:  None FINDINGS: On physical exam, the breast was reported to be swelling, tender, and erythematous. Targeted ultrasound is performed, showing a fluid collection containing no liver echogenic debris. The collection measures approximately 5.6 x 4.0 cm and extends from the 9:00 to  11:00 position. No internal vascularity noted within this collection. No significant hyperemia or edema in the surrounding soft tissue. IMPRESSION: Minimally complex collection, likely a seroma. Developing abscess is not excluded

## 2022-01-24 NOTE — Progress Notes (Signed)
? ? ?Referring Physician(s): ?Dr. Guadlupe Spanish ? ?Supervising Physician: Arne Cleveland ? ?Patient Status:  [facility] - In-pt ? ?Chief Complaint: ? ?Breast abscess ? ?Brief History: ? ?Jenna Cross is a 36 year old female with history of malignant neoplasm of the right breast. ? ?She has undergone  chemotherapy and bilateral mastectomy with breast reconstruction. ? ?She presented to the emergency room for right breast redness, swelling, fever.   ? ?She underwent image guided drain placement for right breast abscess. ? ?Plastic surgery considering I&D and will decide tomorrow. ? ?Subjective: ? ?Sleeping. Husband at bedside, didn't want her awakened. RN also in room. RN states drain flushes easily ? ?Allergies: ?Wound dressings ? ?Medications: ?Prior to Admission medications   ?Medication Sig Start Date End Date Taking? Authorizing Provider  ?acetaminophen (TYLENOL) 500 MG tablet Take 1,000 mg by mouth every 6 (six) hours as needed for mild pain or headache. 01/03/20  Yes [provider]  ?escitalopram (LEXAPRO) 20 MG tablet Take 20 mg by mouth every evening. 06/08/21  Yes [provider]  ?lidocaine-prilocaine (EMLA) cream Apply to affected area once ?Patient taking differently: Apply 1 application. topically as needed (for [facility] access). Apply to affected area once 12/20/21  Yes Nicholas Lose, MD  ?loratadine (CLARITIN) 10 MG tablet Take 10 mg by mouth daily as needed for allergies. 12/06/21  Yes [provider]  ?methocarbamol (ROBAXIN) 500 MG tablet Take 1 tablet (500 mg total) by mouth every 12 (twelve) hours as needed for up to 10 days for muscle spasms. 01/18/22 01/28/22 Yes Scheeler, Carola Rhine, PA-C  ?rivaroxaban (XARELTO) 20 MG TABS tablet Take 1 tablet (20 mg total) by mouth daily with supper. ?Patient taking differently: Take 20 mg by mouth daily. 10/26/21  Yes Nicholas Lose, MD  ?sulfamethoxazole-trimethoprim (BACTRIM DS) 800-160 MG tablet Take 1 tablet by mouth 2 (two) times daily for 7  days. ?Patient taking differently: Take 1 tablet by mouth 2 (two) times daily. 7 day course 01/18/22 01/25/22 Yes Scheeler, Carola Rhine, PA-C  ?triamcinolone (KENALOG) 0.025 % ointment Apply 1 application. topically 2 (two) times daily. 01/09/22  Yes Scheeler, Carola Rhine, PA-C  ?valACYclovir (VALTREX) 500 MG tablet Take 500 mg by mouth every evening. 10/08/18  Yes [provider]  ?ALPRAZolam Duanne Moron) 0.5 MG tablet Take 0.5 mg by mouth 3 (three) times daily as needed for anxiety. ?Patient not taking: Reported on 01/22/2022 10/08/18   [provider]  ?diphenoxylate-atropine (LOMOTIL) 2.5-0.025 MG tablet Take 1 tablet by mouth 3 (three) times daily as needed for diarrhea or loose stools. ?Patient not taking: Reported on 01/22/2022 08/02/21   Nicholas Lose, MD  ?enoxaparin (LOVENOX) 60 MG/0.6ML injection Inject 0.6 mLs (60 mg total) into the skin every 12 (twelve) hours. ?Patient not taking: Reported on 01/22/2022 12/20/21   Nicholas Lose, MD  ?magic mouthwash w/lidocaine SOLN Take 5 mLs by mouth 3 (three) times daily as needed for mouth pain. ?Patient not taking: Reported on 01/22/2022 07/19/21   Nicholas Lose, MD  ?prochlorperazine (COMPAZINE) 10 MG tablet Take 1 tablet (10 mg total) by mouth every 6 (six) hours as needed (Nausea or vomiting). 06/29/21 12/20/21  Nicholas Lose, MD  ? ? ? ?Vital Signs: ?BP 110/69 (BP Location: Left Wrist)   Pulse 80   Temp 98.2 ?F (36.8 ?C) (Oral)   Resp 15   Ht '5\' 4"'$  (1.626 m)   Wt 132 lb (59.9 kg)   SpO2 95%   BMI 22.66 kg/m?  ? ?Physical Exam ?Vitals reviewed.  ?Constitutional:   ?  Comments: Sleeping Soundly  ?Cardiovascular:  ?   Rate and Rhythm: Normal rate and regular rhythm.  ?Pulmonary:  ?   Effort: Pulmonary effort is normal. No respiratory distress.  ?Drain Location: Right breast ?Size: Fr size: 12 Fr ?Date of placement: 01/23/22  ?Currently to: Drain collection device: suction bulb ?24 hour output:  ?Output by Drain (mL) 01/22/22 0701 - 01/22/22 1900 01/22/22 1901 -  01/23/22 0700 01/23/22 0701 - 01/23/22 1900 01/23/22 1901 - 01/24/22 0700 01/24/22 0701 - 01/24/22 1319  ?Closed System Drain 1 Inferior;Right Breast Bulb (JP) 12 Fr.   100 40 30  ? ? ?Current examination: ?Flushes/aspirates easily.  ?Insertion site unremarkable. ?Suture and stat lock in place. ?Dressed appropriately.  ? ? ?Imaging: ?DG Chest Portable 1 View ? ?Result Date: 01/22/2022 ?CLINICAL DATA:  Provided history: Breast implant infection, right. EXAM: PORTABLE CHEST 1 VIEW COMPARISON:  Report from prior chest radiograph 07/11/2021 (images unavailable). FINDINGS: Left chest infusion port catheter with tip projecting at the level of the lower SVC. Heart size within normal limits. No appreciable airspace consolidation. No evidence of pleural effusion or pneumothorax. No acute bony abnormality identified. Surgical clips project in the region of the bilateral chest and right axilla. Thoracic levocurvature. IMPRESSION: No evidence of acute cardiopulmonary abnormality. Electronically Signed   By: Kellie Simmering D.O.   On: 01/22/2022 10:57  ? ?US BREAST LTD UNI RIGHT INC AXILLA ? ?Result Date: 01/22/2022 ?CLINICAL DATA:  Status post mastectomy on 12/06/2021 and implant placement on 01/01/2022. Breast swelling and redness. Evaluate for abscess. EXAM: ULTRASOUND OF THE right BREAST COMPARISON:  None FINDINGS: On physical exam, the breast was reported to be swelling, tender, and erythematous. Targeted ultrasound is performed, showing a fluid collection containing no liver echogenic debris. The collection measures approximately 5.6 x 4.0 cm and extends from the 9:00 to 11:00 position. No internal vascularity noted within this collection. No significant hyperemia or edema in the surrounding soft tissue. IMPRESSION: Minimally complex collection, likely a seroma. Developing abscess is not excluded. Clinical correlation is recommended. Electronically Signed   By: Anner Crete M.D.   On: 01/22/2022 19:29  ? ?[place] IMAGE GUIDED  DRAINAGE BY PERCUTANEOUS CATHETER ? ?Result Date: 01/23/2022 ?INDICATION: 36 year old female with a history of breast cancer status post bilateral mastectomy and reconstruction. She now has redness, induration and a large complex fluid collection surrounding the right reconstruction implant concerning for possible breast abscess. She presents for ultrasound-guided drain placement. EXAM: [place] IMAGE GUIDED DRAINAGE BY PERCUTANEOUS CATHETE MEDICATIONS: The patient is currently admitted to the hospital and receiving intravenous antibiotics. The antibiotics were administered within an appropriate time frame prior to the initiation of the procedure. ANESTHESIA/SEDATION: Fentanyl 4 mcg IV; Versed 150 mg IV Moderate Sedation Time:  15 minutes The patient was continuously monitored during the procedure by the interventional radiology nurse under my direct supervision. COMPLICATIONS: None immediate. PROCEDURE: Informed written consent was obtained from the patient after a thorough discussion of the procedural risks, benefits and alternatives. All questions were addressed. Maximal Sterile Barrier Technique was utilized including caps, mask, sterile gowns, sterile gloves, sterile drape, hand hygiene and skin antiseptic. A timeout was performed prior to the initiation of the procedure. The right breast was interrogated with ultrasound. A large complex fluid collection is visualized surrounding the implant prosthesis. A suitable skin entry site at the superior margin of the implant was selected. Local anesthesia was attained by infiltration with 1% lidocaine. A small dermatotomy was made. Under real-time ultrasound guidance, an 18 gauge  trocar needle was carefully advanced through the overlying tissue and into the fluid collection. Great care was taken to avoid the prosthesis. The needle was then navigated into the more dependent portion of the fluid collection. A 0.035 wire was then coiled in the collection. The skin tract was  dilated to 12 [place]. A 12 French all-purpose drainage catheter modified with additional sideholes was selected and advanced over the wire. Aspiration was then performed yielding approximately 325 mL of purulent pink opaque

## 2022-01-25 ENCOUNTER — Encounter (HOSPITAL_COMMUNITY)
Admission: EM | Disposition: A | Discharge status: Home / Self Care | Payer: Self-pay | Source: Ambulatory Visit | Attending: Plastic Surgery

## 2022-01-25 ENCOUNTER — Ambulatory Visit: Payer: 59

## 2022-01-25 ENCOUNTER — Encounter (HOSPITAL_COMMUNITY): Payer: Self-pay | Admitting: Plastic Surgery

## 2022-01-25 ENCOUNTER — Inpatient Hospital Stay (HOSPITAL_COMMUNITY): Payer: 59 | Admitting: Anesthesiology

## 2022-01-25 DIAGNOSIS — C50919 Malignant neoplasm of unspecified site of unspecified female breast: Secondary | ICD-10-CM

## 2022-01-25 DIAGNOSIS — N61 Mastitis without abscess: Secondary | ICD-10-CM | POA: Diagnosis not present

## 2022-01-25 DIAGNOSIS — Z170 Estrogen receptor positive status [ER+]: Secondary | ICD-10-CM

## 2022-01-25 DIAGNOSIS — Z9013 Acquired absence of bilateral breasts and nipples: Secondary | ICD-10-CM

## 2022-01-25 DIAGNOSIS — D630 Anemia in neoplastic disease: Secondary | ICD-10-CM

## 2022-01-25 DIAGNOSIS — N611 Abscess of the breast and nipple: Secondary | ICD-10-CM

## 2022-01-25 DIAGNOSIS — T8579XA Infection and inflammatory reaction due to other internal prosthetic devices, implants and grafts, initial encounter: Secondary | ICD-10-CM

## 2022-01-25 HISTORY — PX: BREAST IMPLANT EXCHANGE: SHX6296

## 2022-01-25 SURGERY — REPLACEMENT, IMPLANT, BREAST
Anesthesia: General | Site: Breast | Laterality: Right

## 2022-01-25 MED ORDER — ONDANSETRON HCL 4 MG/2ML IJ SOLN
INTRAMUSCULAR | Status: AC
  Filled 2022-01-25: qty 2

## 2022-01-25 MED ORDER — DEXAMETHASONE SODIUM PHOSPHATE 10 MG/ML IJ SOLN
INTRAMUSCULAR | Status: AC
  Filled 2022-01-25: qty 1

## 2022-01-25 MED ORDER — ACETAMINOPHEN 500 MG PO TABS
ORAL_TABLET | ORAL | Status: AC
  Administered 2022-01-25: 1000 mg via ORAL
  Filled 2022-01-25: qty 2

## 2022-01-25 MED ORDER — BACITRACIN ZINC 500 UNIT/GM EX OINT
TOPICAL_OINTMENT | CUTANEOUS | Status: DC | PRN
  Administered 2022-01-25: 1 via TOPICAL

## 2022-01-25 MED ORDER — LACTATED RINGERS IV SOLN: INTRAVENOUS | Status: DC

## 2022-01-25 MED ORDER — PROPOFOL 10 MG/ML IV BOLUS
INTRAVENOUS | Status: DC | PRN
  Administered 2022-01-25: 160 mg via INTRAVENOUS

## 2022-01-25 MED ORDER — PHENYLEPHRINE 80 MCG/ML (10ML) SYRINGE FOR IV PUSH (FOR BLOOD PRESSURE SUPPORT)
PREFILLED_SYRINGE | INTRAVENOUS | Status: DC | PRN
Start: 2022-01-25 — End: 2022-01-25
  Administered 2022-01-25: 80 ug via INTRAVENOUS

## 2022-01-25 MED ORDER — MIDAZOLAM HCL 2 MG/2ML IJ SOLN
INTRAMUSCULAR | Status: AC
  Filled 2022-01-25: qty 2

## 2022-01-25 MED ORDER — CHLORHEXIDINE GLUCONATE CLOTH 2 % EX PADS
6.0000 | MEDICATED_PAD | Freq: Every day | CUTANEOUS | Status: DC
  Administered 2022-01-25: 6 via TOPICAL

## 2022-01-25 MED ORDER — CHLORHEXIDINE GLUCONATE 0.12 % MT SOLN: 15.0000 mL | Freq: Once | OROMUCOSAL | Status: AC

## 2022-01-25 MED ORDER — ORAL CARE MOUTH RINSE: 15.0000 mL | Freq: Once | OROMUCOSAL | Status: AC

## 2022-01-25 MED ORDER — CEFAZOLIN SODIUM-DEXTROSE 2-4 GM/100ML-% IV SOLN
2.0000 g | Freq: Three times a day (TID) | INTRAVENOUS | Status: DC
  Administered 2022-01-25 – 2022-01-26 (×2): 2 g via INTRAVENOUS
  Filled 2022-01-25 (×3): qty 100

## 2022-01-25 MED ORDER — SURGIPHOR WOUND IRRIGATION SYSTEM - OPTIME
TOPICAL | Status: DC | PRN
  Administered 2022-01-25: 450 mL via TOPICAL

## 2022-01-25 MED ORDER — 0.9 % SODIUM CHLORIDE (POUR BTL) OPTIME
TOPICAL | Status: DC | PRN
  Administered 2022-01-25: 1000 mL

## 2022-01-25 MED ORDER — MUPIROCIN 2 % EX OINT
1.0000 "application " | TOPICAL_OINTMENT | Freq: Two times a day (BID) | CUTANEOUS | Status: DC
  Administered 2022-01-25 (×2): 1 via NASAL
  Filled 2022-01-25: qty 22

## 2022-01-25 MED ORDER — DEXAMETHASONE SODIUM PHOSPHATE 10 MG/ML IJ SOLN
INTRAMUSCULAR | Status: DC | PRN
  Administered 2022-01-25: 5 mg via INTRAVENOUS

## 2022-01-25 MED ORDER — LIDOCAINE 2% (20 MG/ML) 5 ML SYRINGE
INTRAMUSCULAR | Status: AC
  Filled 2022-01-25: qty 5

## 2022-01-25 MED ORDER — SCOPOLAMINE 1 MG/3DAYS TD PT72: 1.0000 | MEDICATED_PATCH | TRANSDERMAL | Status: DC

## 2022-01-25 MED ORDER — SCOPOLAMINE 1 MG/3DAYS TD PT72
MEDICATED_PATCH | TRANSDERMAL | Status: AC
  Administered 2022-01-25: 1.5 mg via TRANSDERMAL
  Filled 2022-01-25: qty 1

## 2022-01-25 MED ORDER — FENTANYL CITRATE (PF) 250 MCG/5ML IJ SOLN
INTRAMUSCULAR | Status: AC
  Filled 2022-01-25: qty 5

## 2022-01-25 MED ORDER — LIDOCAINE 2% (20 MG/ML) 5 ML SYRINGE
INTRAMUSCULAR | Status: DC | PRN
  Administered 2022-01-25: 50 mg via INTRAVENOUS

## 2022-01-25 MED ORDER — OXYCODONE HCL 5 MG PO TABS
ORAL_TABLET | ORAL | Status: AC
  Filled 2022-01-25: qty 2

## 2022-01-25 MED ORDER — DOUBLE ANTIBIOTIC 500-10000 UNIT/GM EX OINT
TOPICAL_OINTMENT | CUTANEOUS | Status: AC
  Filled 2022-01-25: qty 28.4

## 2022-01-25 MED ORDER — ACETAMINOPHEN 500 MG PO TABS: 1000.0000 mg | ORAL_TABLET | Freq: Once | ORAL | Status: AC

## 2022-01-25 MED ORDER — FENTANYL CITRATE (PF) 250 MCG/5ML IJ SOLN
INTRAMUSCULAR | Status: DC | PRN
  Administered 2022-01-25: 50 ug via INTRAVENOUS

## 2022-01-25 MED ORDER — MIDAZOLAM HCL 2 MG/2ML IJ SOLN
INTRAMUSCULAR | Status: DC | PRN
  Administered 2022-01-25: 2 mg via INTRAVENOUS

## 2022-01-25 MED ORDER — CHLORHEXIDINE GLUCONATE 0.12 % MT SOLN
OROMUCOSAL | Status: AC
  Administered 2022-01-25: 15 mL via OROMUCOSAL
  Filled 2022-01-25: qty 15

## 2022-01-25 MED ORDER — BACITRACIN ZINC 500 UNIT/GM EX OINT
TOPICAL_OINTMENT | CUTANEOUS | Status: AC
  Filled 2022-01-25: qty 28.35

## 2022-01-25 MED ORDER — ONDANSETRON HCL 4 MG/2ML IJ SOLN
INTRAMUSCULAR | Status: DC | PRN
  Administered 2022-01-25: 4 mg via INTRAVENOUS

## 2022-01-25 SURGICAL SUPPLY — 38 items
BAG COUNTER SPONGE SURGICOUNT (BAG) ×3 IMPLANT
BAG SPNG CNTER NS LX DISP (BAG) ×1
BINDER BREAST LRG (GAUZE/BANDAGES/DRESSINGS) ×2 IMPLANT
BIOPATCH RED 1 DISK 7.0 (GAUZE/BANDAGES/DRESSINGS) ×5 IMPLANT
CANISTER SUCT 3000ML PPV (MISCELLANEOUS) ×3 IMPLANT
COVER SURGICAL LIGHT HANDLE (MISCELLANEOUS) ×3 IMPLANT
DRAIN CHANNEL 19F RND (DRAIN) ×3 IMPLANT
DRAPE HALF SHEET 40X57 (DRAPES) ×6 IMPLANT
DRAPE LAPAROSCOPIC ABDOMINAL (DRAPES) ×3 IMPLANT
DRAPE ORTHO SPLIT 77X108 STRL (DRAPES) ×4
DRAPE SURG ORHT 6 SPLT 77X108 (DRAPES) ×4 IMPLANT
DRSG MEPILEX BORDER 4X8 (GAUZE/BANDAGES/DRESSINGS) ×1 IMPLANT
DRSG PAD ABDOMINAL 8X10 ST (GAUZE/BANDAGES/DRESSINGS) ×7 IMPLANT
ELECT BLADE 4.0 EZ CLEAN MEGAD (MISCELLANEOUS) ×2
ELECT REM PT RETURN 9FT ADLT (ELECTROSURGICAL) ×2
ELECTRODE BLDE 4.0 EZ CLN MEGD (MISCELLANEOUS) ×2 IMPLANT
ELECTRODE REM PT RTRN 9FT ADLT (ELECTROSURGICAL) ×2 IMPLANT
EVACUATOR SILICONE 100CC (DRAIN) ×3 IMPLANT
GAUZE SPONGE 4X4 12PLY STRL (GAUZE/BANDAGES/DRESSINGS) ×3 IMPLANT
GLOVE BIO SURGEON STRL SZ 6.5 (GLOVE) ×6 IMPLANT
GOWN STRL REUS W/ TWL LRG LVL3 (GOWN DISPOSABLE) ×6 IMPLANT
GOWN STRL REUS W/TWL LRG LVL3 (GOWN DISPOSABLE) ×6
HEMOSTAT ARISTA ABSORB 3G PWDR (HEMOSTASIS) ×4 IMPLANT
KIT BASIN OR (CUSTOM PROCEDURE TRAY) ×3 IMPLANT
KIT TURNOVER KIT B (KITS) ×3 IMPLANT
MARKER SKIN DUAL TIP RULER LAB (MISCELLANEOUS) ×3 IMPLANT
NS IRRIG 1000ML POUR BTL (IV SOLUTION) ×5 IMPLANT
PACK GENERAL/GYN (CUSTOM PROCEDURE TRAY) ×3 IMPLANT
PAD ARMBOARD 7.5X6 YLW CONV (MISCELLANEOUS) ×6 IMPLANT
PIN SAFETY STERILE (MISCELLANEOUS) ×3 IMPLANT
SUT MNCRL AB 3-0 PS2 18 (SUTURE) ×9 IMPLANT
SUT MON AB 4-0 PS1 27 (SUTURE) ×5 IMPLANT
SUT MON AB 5-0 PS2 18 (SUTURE) ×5 IMPLANT
SUT SILK 3 0 SH 30 (SUTURE) ×3 IMPLANT
SUT VIC AB 3-0 SH 18 (SUTURE) ×5 IMPLANT
SUT VIC AB 3-0 SH 27 (SUTURE) ×2
SUT VIC AB 3-0 SH 27X BRD (SUTURE) ×6 IMPLANT
TOWEL GREEN STERILE FF (TOWEL DISPOSABLE) ×3 IMPLANT

## 2022-01-25 NOTE — Op Note (Signed)
DATE OF OPERATION: 01/25/2022 ? ?LOCATION: [facility] ? ?PREOPERATIVE DIAGNOSIS: Right breast infection ? ?POSTOPERATIVE DIAGNOSIS: Same ? ?PROCEDURE: Removal of right breast implant and Flex HD ? ?SURGEON: Kenzo Ozment Sanger Mairen Wallenstein, DO ? ?ASSISTANT: Roetta Sessions, PA ? ?EBL: 10 cc ? ?CONDITION: Stable ? ?COMPLICATIONS: None ? ?INDICATION: The patient, Jenna Cross, is a 36 y.o. female born on 1986-06-20, is here for treatment of an infection in the right breast after breast cancer reconstruction.  ? ?PROCEDURE DETAILS:  ?The patient was seen prior to surgery and marked.  The IV antibiotics were given. The patient was taken to the operating room and given a general anesthetic. A standard time out was performed and all information was confirmed by those in the room. SCDs were placed.   The drain was removed.  The right breast was prepped and draped.  A #15 blade was used to open the medial aspect of the previous incision.  The Flex HD was opened and the implant removed.  The entire Flex HD was removed.  The pocket was irrigated with a betadine rinse.  Saline was then used to irrigate the wound.  Hemoblast was then used to control the bleeding.  A drain was placed and secured to the chest with the 3-0 [place].  The incision was closed with the 3-0 PDS followed by the 3-0 Monocryl.  Bactroban was applied with a dressing. The patient was allowed to wake up and taken to recovery room in stable condition at the end of the case. The family was notified at the end of the case.  ? ?The advanced practice practitioner (APP) assisted throughout the case.  The APP was essential in retraction and counter traction when needed to make the case progress smoothly.  This retraction and assistance made it possible to see the tissue plans for the procedure.  The assistance was needed for blood control, tissue re-approximation and assisted with closure of the incision site. ? ?

## 2022-01-25 NOTE — Anesthesia Procedure Notes (Signed)
Procedure Name: LMA Insertion ?Date/Time: 01/25/2022 4:06 PM ?Performed by: Reece Agar, CRNA ?Pre-anesthesia Checklist: Patient identified, Emergency Drugs available, Suction available and Patient being monitored ?Patient Re-evaluated:Patient Re-evaluated prior to induction ?Oxygen Delivery Method: Circle System Utilized ?Preoxygenation: Pre-oxygenation with 100% oxygen ?Induction Type: IV induction ?Ventilation: Mask ventilation without difficulty ?LMA: LMA inserted ?LMA Size: 4.0 ?Number of attempts: 1 ?Airway Equipment and Method: Bite block ?Placement Confirmation: positive ETCO2 ?Tube secured with: Tape ?Dental Injury: Teeth and Oropharynx as per pre-operative assessment  ? ? ? ? ?

## 2022-01-25 NOTE — Transfer of Care (Signed)
Immediate Anesthesia Transfer of Care Note ? ?Patient: Jenna Cross ? ?Procedure(s) Performed: FLEX HD BREAST IMPLANT REMOVAL (Right: Breast) ? ?Patient Location: PACU ? ?Anesthesia Type:General ? ?Level of Consciousness: awake and alert  ? ?Airway & Oxygen Therapy: Patient Spontanous Breathing and Patient connected to face mask oxygen ? ?Post-op Assessment: Report given to RN and Post -op Vital signs reviewed and stable ? ?Post vital signs: Reviewed and stable ? ?Last Vitals:  ?Vitals Value Taken Time  ?BP 125/82 01/25/22 1711  ?Temp    ?Pulse 78 01/25/22 1715  ?Resp 17 01/25/22 1715  ?SpO2 98 % 01/25/22 1715  ?Vitals shown include unvalidated device data. ? ?Last Pain:  ?Vitals:  ? 01/25/22 1524  ?TempSrc:   ?PainSc: 0-No pain  ?   ? ?Patients Stated Pain Goal: 3 (01/25/22 1524) ? ?Complications: No notable events documented. ?

## 2022-01-25 NOTE — Progress Notes (Signed)
Pharmacy Antibiotic Note ? ?Jenna Cross is a 36 y.o. female admitted on 01/22/2022 with MSSA R breast cellulitis s/p drain placement by IR on 4/18.  Pharmacy has been consulted for cefazolin dosing.  ? ?Plan: ?Start cefazolin 2g IV q8h tonight at 20:00 ? ?Height: '5\' 4"'$  (162.6 cm) ?Weight: 59.9 kg (132 lb) ?IBW/kg (Calculated) : 54.7 ? ?Temp (24hrs), Avg:98.1 ?F (36.7 ?C), Min:97.9 ?F (36.6 ?C), Max:98.3 ?F (36.8 ?C) ? ?Recent Labs  ?Lab 01/22/22 ?1001 01/23/22 ?0114 01/23/22 ?1120 01/24/22 ?0040  ?WBC 7.0  --   --  8.9  ?CREATININE 0.77 0.71 0.74  --   ?LATICACIDVEN 1.2  --   --   --   ?  ?Estimated Creatinine Clearance: 83.9 mL/min (by C-G formula based on SCr of 0.74 mg/dL).   ? ?Allergies  ?Allergen Reactions  ? Wound Dressings Other (See Comments)  ?  Honey comb dressing causes redness and inflammation   ? ? ?Antimicrobials this admission: ?Vanc 4/17 >> 4/20 ?CTX 4/17 >> 4/19 ?Cefazolin 4/20 >>  ? ?Microbiology results: ?4/17 Bld cx x2: ngtd x2 ?4/18 MRSA PCR: +ve ?4/17 R breast drainage cx: abundant MSSA ? ?Thank you for allowing pharmacy to be a part of this patient?s care. ? ?Kaleen Mask ?01/25/2022 11:35 AM ? ?

## 2022-01-25 NOTE — Plan of Care (Signed)
  Problem: Education: Goal: Knowledge of General Education information will improve Description: Including pain rating scale, medication(s)/side effects and non-pharmacologic comfort measures Outcome: Progressing   Problem: Activity: Goal: Risk for activity intolerance will decrease Outcome: Progressing   Problem: Elimination: Goal: Will not experience complications related to bowel motility Outcome: Progressing   

## 2022-01-25 NOTE — Interval H&P Note (Signed)
History and Physical Interval Note: ? ?01/25/2022 ?2:48 PM ? ?Jenna Cross  has presented today for surgery, with the diagnosis of Breast Infection.  The various methods of treatment have been discussed with the patient and family. After consideration of risks, benefits and other options for treatment, the patient has consented to  Procedure(s) with comments: ?BREAST IMPLANT EXCHANGE (Right) - 1 hour ?POSSIBLE PLACEMENT OF TISSUE EXPANDER (Right) as a surgical intervention.  The patient's history has been reviewed, patient examined, no change in status, stable for surgery.  I have reviewed the patient's chart and labs.  Questions were answered to the patient's satisfaction.   ? ? ?Jenna Cross ? ? ?

## 2022-01-25 NOTE — Progress Notes (Signed)
?PROGRESS NOTE ? ?Jenna Cross  TIR:[id number] DOB: Jul 28, 1986 DOA: 01/22/2022 ?PCP: Dian Queen, MD  ? ?Brief Narrative: ?Patient is a 36 year old female with history of malignant neoplasm of the right breast status post neoadjuvant chemotherapy, bilateral mastectomy with breast reconstruction, history of DVT on Xarelto who presented to the emergency room for right breast redness, swelling, fever.  She was following with plastic surgery as an outpatient and was on oral antibiotic Bactrim which did not help.  She was sent to the emergency department for further evaluation and need for IV antibiotics.  She underwent IR guided drain placement for right breast abscess, plastic surgery considering I&D/they will decide today. ? ?Assessment & Plan: ? ?Principal Problem: ?  Infection of right breast ?Active Problems: ?  Malignant neoplasm of upper-outer quadrant of right breast in female, estrogen receptor positive ([place]) ?  Breast cancer ([place]) ?  History of DVT (deep vein thrombosis) ? ?Right breast infection: Presented with fever, edema and redness of the right breast.  Was following with plastic surgery as an outpatient, was on Bactrim.  Failed antibiotic outpatient therapy.   Blood cultures no growth till date. Wound cultures showed MSSA.  Antibiotics will be changed to doxy or keflex on DC.She was  on vancomycin and ceftriaxone: changed to cefazoline. ?Elevated CRP. ?She underwent IR guided drain placement on right breast on 4/18.  Plastic surgery considering I&D/washout ? ?History of DVT: On Xarelto.  Currently on hold for surgery ? ?Right breast cancer: Estrogen positive.  Status post neoadjuvant chemotherapy, bilateral mastectomy with breast implant placement.  Follows with oncology, Dr. Lindi Adie ? ? ? ?  ?  ? ?DVT prophylaxis:enoxaparin (LOVENOX) injection 30 mg Start: 01/24/22 2100 ?SCDs Start: 01/23/22 0528 ? ? ?  Code Status: Full Code ? ?Family Communication: None at bedside ? ?Patient  status:Inpatient ? ?Patient is from :Home ? ?Anticipated discharge QP:YPPJ ? ?Estimated DC date:after clearance from plastic surgery ? ? ?Consultants: Plastic surgery ? ?Procedures: Plan for I&D ? ?Antimicrobials:  ?Anti-infectives (From admission, onward)  ? ? Start     Dose/Rate Route Frequency Ordered Stop  ? 01/24/22 2145  valACYclovir (VALTREX) tablet 500 mg       ? 500 mg Oral  Once 01/24/22 2048 01/24/22 2154  ? 01/23/22 2100  valACYclovir (VALTREX) tablet 500 mg       ? 500 mg Oral  Once 01/23/22 2004 01/23/22 2146  ? 01/22/22 2300  valACYclovir (VALTREX) tablet 500 mg       ? 500 mg Oral  Once 01/22/22 2214 01/22/22 2229  ? 01/22/22 2200  vancomycin (VANCOREADY) IVPB 750 mg/150 mL       ? 750 mg ?150 mL/hr over 60 Minutes Intravenous Every 12 hours 01/22/22 1055    ? 01/22/22 2000  cefTRIAXone (ROCEPHIN) 2 g in sodium chloride 0.9 % 100 mL IVPB       ? 2 g ?200 mL/hr over 30 Minutes Intravenous Every 24 hours 01/22/22 1918    ? 01/22/22 1030  vancomycin (VANCOCIN) IVPB 1000 mg/200 mL premix       ? 1,000 mg ?200 mL/hr over 60 Minutes Intravenous  Once 01/22/22 1015 01/22/22 1152  ? ?  ? ? ?Subjective: ? ?Patient seen and examined at bedside this morning.  Hemodynamically stable without any complaints.  Anticipating surgery from plastic team ? ?Objective: ?Vitals:  ? 01/24/22 0714 01/24/22 1640 01/25/22 0454 01/25/22 0753  ?BP: 110/69 121/77 109/81 114/77  ?Pulse: 80 70 79 73  ?Resp: 15 16  17 17  ?Temp: 98.2 ?F (36.8 ?C) 97.9 ?F (36.6 ?C) 98.2 ?F (36.8 ?C) 98.3 ?F (36.8 ?C)  ?TempSrc: Oral Oral Oral Oral  ?SpO2: 95% 96% 96% 100%  ?Weight:      ?Height:      ? ? ?Intake/Output Summary (Last 24 hours) at 01/25/2022 1016 ?Last data filed at 01/25/2022 0600 ?Gross per 24 hour  ?Intake 1713.54 ml  ?Output 68 ml  ?Net 1645.54 ml  ? ?Filed Weights  ? 01/22/22 0955  ?Weight: 59.9 kg  ? ? ?Examination: ? ?General exam: Overall comfortable, not in distress ?HEENT: PERRL ?Respiratory system:  no wheezes or crackles   ?Cardiovascular system: S1 & S2 heard, RRR.  ?Gastrointestinal system: Abdomen is nondistended, soft and nontender. ?Central nervous system: Alert and oriented ?Extremities: No edema, no clubbing ,no cyanosis ?Skin: No rashes, no ulcers,no icterus   ?Breast: Drain, dressing on the right breast ? ? ?Data Reviewed: I have personally reviewed following labs and imaging studies ? ?CBC: ?Recent Labs  ?Lab 01/22/22 ?1001 01/24/22 ?0040  ?WBC 7.0 8.9  ?NEUTROABS 4.6  --   ?HGB 12.8 11.1*  ?HCT 38.6 35.0*  ?MCV 95.3 98.6  ?PLT 184 195  ? ?Basic Metabolic Panel: ?Recent Labs  ?Lab 01/22/22 ?1001 01/23/22 ?0114 01/23/22 ?1120  ?NA 136 135 134*  ?K 3.3* 3.7 3.7  ?CL 98 102 102  ?CO2 '27 22 23  '$ ?GLUCOSE 99 99 112*  ?BUN '11 8 7  '$ ?CREATININE 0.77 0.71 0.74  ?CALCIUM 9.2 8.0* 8.0*  ? ? ? ?Recent Results (from the past 240 hour(s))  ?Culture, blood (Routine x 2)     Status: None (Preliminary result)  ? Collection Time: 01/22/22 10:01 AM  ? Specimen: Left Antecubital; Blood  ?Result Value Ref Range Status  ? Specimen Description   Final  ?  LEFT ANTECUBITAL ?Performed at [facility], [address] [place], [place] [address] ?  ? Special Requests   Final  ?  BOTTLES DRAWN AEROBIC AND ANAEROBIC Blood Culture adequate volume ?Performed at [facility], [address], [place], [place] [address] ?  ? Culture   Final  ?  NO GROWTH 3 DAYS ?Performed at [facility], [place] [address] [place], [place] [address] ?  ? Report Status PENDING  Incomplete  ?Culture, blood (Routine x 2)     Status: None (Preliminary result)  ? Collection Time: 01/22/22 10:20 AM  ? Specimen: BLOOD LEFT WRIST  ?Result Value Ref Range Status  ? Specimen Description   Final  ?  BLOOD LEFT WRIST ?Performed at [facility], [address] [place], [place] [address] ?  ? Special Requests   Final  ?  BOTTLES DRAWN AEROBIC AND ANAEROBIC Blood Culture results may not be optimal due to an inadequate volume  of blood received in culture bottles ?Performed at [facility], [address] [place], [place] [address] ?  ? Culture   Final  ?  NO GROWTH 3 DAYS ?Performed at [facility], [place] [address] [place], [place] [address] ?  ? Report Status PENDING  Incomplete  ?Surgical pcr screen     Status: Abnormal  ? Collection Time: 01/23/22  5:40 AM  ? Specimen: Nasal Mucosa; Nasal Swab  ?Result Value Ref Range Status  ? MRSA, PCR NEGATIVE NEGATIVE Final  ? Staphylococcus aureus POSITIVE (A) NEGATIVE Final  ?  Comment: (NOTE) ?The Xpert SA Assay (FDA approved for NASAL specimens in patients 22 ?years of  age and older), is one component of a comprehensive ?surveillance program. It is not intended to diagnose infection nor to ?guide or monitor treatment. ?Performed at [facility], [place] [address], [place], [place] ?[address] ?  ?Aerobic/Anaerobic Culture w Gram Stain (surgical/deep wound)     Status: None (Preliminary result)  ? Collection Time: 01/23/22 11:26 AM  ? Specimen: Abscess  ?Result Value Ref Range Status  ? Specimen Description ABSCESS  Final  ? Special Requests Normal  Final  ? Gram Stain   Final  ?  ABUNDANT WBC PRESENT, PREDOMINANTLY PMN ?ABUNDANT GRAM POSITIVE COCCI IN CLUSTERS ?Performed at [facility], [place] [address], [place], [place] [address] ?  ? Culture   Final  ?  ABUNDANT STAPHYLOCOCCUS AUREUS ?NO ANAEROBES ISOLATED; CULTURE IN PROGRESS FOR 5 DAYS ?  ? Report Status PENDING  Incomplete  ? Organism ID, Bacteria STAPHYLOCOCCUS AUREUS  Final  ?    Susceptibility  ? Staphylococcus aureus - MIC*  ?  CIPROFLOXACIN <=0.5 SENSITIVE Sensitive   ?  ERYTHROMYCIN <=0.25 SENSITIVE Sensitive   ?  GENTAMICIN <=0.5 SENSITIVE Sensitive   ?  OXACILLIN <=0.25 SENSITIVE Sensitive   ?  TETRACYCLINE <=1 SENSITIVE Sensitive   ?  VANCOMYCIN 1 SENSITIVE Sensitive   ?  TRIMETH/SULFA <=10 SENSITIVE Sensitive   ?  CLINDAMYCIN <=0.25 SENSITIVE Sensitive   ?  RIFAMPIN <=0.5 SENSITIVE Sensitive   ?   Inducible Clindamycin NEGATIVE Sensitive   ?  * ABUNDANT STAPHYLOCOCCUS AUREUS  ?  ? ?Radiology Studies: ?[place] IMAGE GUIDED DRAINAGE BY PERCUTANEOUS CATHETER ? ?Result Date: 01/23/2022 ?INDICATION: 36 year old female with a

## 2022-01-25 NOTE — Progress Notes (Signed)
? ? ?Supervising Physician: Juliet Rude ? ?Patient Status:  [facility] - In-pt ? ?Chief Complaint: ? ?Breast Abscess ? ?Subjective: ? ?"Doing Good", reports plan for surgical removal of implant today and expected d/c tomorrow.   ? ?Allergies: ?Wound dressings ? ?Medications: ?Prior to Admission medications   ?Medication Sig Start Date End Date Taking? Authorizing Provider  ?acetaminophen (TYLENOL) 500 MG tablet Take 1,000 mg by mouth every 6 (six) hours as needed for mild pain or headache. 01/03/20  Yes [provider]  ?escitalopram (LEXAPRO) 20 MG tablet Take 20 mg by mouth every evening. 06/08/21  Yes [provider]  ?lidocaine-prilocaine (EMLA) cream Apply to affected area once ?Patient taking differently: Apply 1 application. topically as needed (for [facility] access). Apply to affected area once 12/20/21  Yes Nicholas Lose, MD  ?loratadine (CLARITIN) 10 MG tablet Take 10 mg by mouth daily as needed for allergies. 12/06/21  Yes [provider]  ?methocarbamol (ROBAXIN) 500 MG tablet Take 1 tablet (500 mg total) by mouth every 12 (twelve) hours as needed for up to 10 days for muscle spasms. 01/18/22 01/28/22 Yes Scheeler, Carola Rhine, PA-C  ?rivaroxaban (XARELTO) 20 MG TABS tablet Take 1 tablet (20 mg total) by mouth daily with supper. ?Patient taking differently: Take 20 mg by mouth daily. 10/26/21  Yes Nicholas Lose, MD  ?sulfamethoxazole-trimethoprim (BACTRIM DS) 800-160 MG tablet Take 1 tablet by mouth 2 (two) times daily for 7 days. ?Patient taking differently: Take 1 tablet by mouth 2 (two) times daily. 7 day course 01/18/22 01/25/22 Yes Scheeler, Carola Rhine, PA-C  ?triamcinolone (KENALOG) 0.025 % ointment Apply 1 application. topically 2 (two) times daily. 01/09/22  Yes Scheeler, Carola Rhine, PA-C  ?valACYclovir (VALTREX) 500 MG tablet Take 500 mg by mouth every evening. 10/08/18  Yes [provider]  ?ALPRAZolam Duanne Moron) 0.5 MG tablet Take 0.5 mg by mouth 3 (three) times daily as needed for  anxiety. ?Patient not taking: Reported on 01/22/2022 10/08/18   [provider]  ?diphenoxylate-atropine (LOMOTIL) 2.5-0.025 MG tablet Take 1 tablet by mouth 3 (three) times daily as needed for diarrhea or loose stools. ?Patient not taking: Reported on 01/22/2022 08/02/21   Nicholas Lose, MD  ?enoxaparin (LOVENOX) 60 MG/0.6ML injection Inject 0.6 mLs (60 mg total) into the skin every 12 (twelve) hours. ?Patient not taking: Reported on 01/22/2022 12/20/21   Nicholas Lose, MD  ?magic mouthwash w/lidocaine SOLN Take 5 mLs by mouth 3 (three) times daily as needed for mouth pain. ?Patient not taking: Reported on 01/22/2022 07/19/21   Nicholas Lose, MD  ?prochlorperazine (COMPAZINE) 10 MG tablet Take 1 tablet (10 mg total) by mouth every 6 (six) hours as needed (Nausea or vomiting). 06/29/21 12/20/21  Nicholas Lose, MD  ? ? ? ?Vital Signs: ?BP 114/77 (BP Location: Left Wrist)   Pulse 73   Temp 98.3 ?F (36.8 ?C) (Oral)   Resp 17   Ht '5\' 4"'$  (1.626 m)   Wt 132 lb (59.9 kg)   SpO2 100%   BMI 22.66 kg/m?  ? ?Physical Exam ?Vitals reviewed.  ?Constitutional:   ?   General: She is not in acute distress. ?HENT:  ?   Head: Normocephalic and atraumatic.  ?   Mouth/Throat:  ?   Pharynx: Oropharynx is clear.  ?Eyes:  ?   Extraocular Movements: Extraocular movements intact.  ?Pulmonary:  ?   Effort: Pulmonary effort is normal. No respiratory distress.  ?Skin: ?   General: Skin is warm and dry.  ?Neurological:  ?  General: No focal deficit present.  ?   Mental Status: She is alert and oriented to person, place, and time.  ?Psychiatric:     ?   Mood and Affect: Mood normal.     ?   Behavior: Behavior normal.  ?  ?Drain Location: Right Breast ?Size: Fr size: 12 Fr ?Date of placement: 01/25/22 ?Currently to: Drain collection device: suction bulb ?24 hour output:  ?Output by Drain (mL) 01/23/22 0700 - 01/23/22 1459 01/23/22 1500 - 01/23/22 2259 01/23/22 2300 - 01/24/22 0659 01/24/22 0700 - 01/24/22 1459 01/24/22 1500 - 01/24/22 2259  01/24/22 2300 - 01/25/22 0659 01/25/22 0700 - 01/25/22 1342  ?Closed System Drain 1 Inferior;Right Breast Bulb (JP) 12 Fr. 70 70  30  65 30  ? ? ?Current examination: ?Flushes/aspirates easily.  ?Insertion site unremarkable.  Still very tender to touch ?Suture and stat lock in place. ?Dressed appropriately.  ? ? ?Imaging: ?DG Chest Portable 1 View ? ?Result Date: 01/22/2022 ?CLINICAL DATA:  Provided history: Breast implant infection, right. EXAM: PORTABLE CHEST 1 VIEW COMPARISON:  Report from prior chest radiograph 07/11/2021 (images unavailable). FINDINGS: Left chest infusion port catheter with tip projecting at the level of the lower SVC. Heart size within normal limits. No appreciable airspace consolidation. No evidence of pleural effusion or pneumothorax. No acute bony abnormality identified. Surgical clips project in the region of the bilateral chest and right axilla. Thoracic levocurvature. IMPRESSION: No evidence of acute cardiopulmonary abnormality. Electronically Signed   By: Kellie Simmering D.O.   On: 01/22/2022 10:57  ? ?US BREAST LTD UNI RIGHT INC AXILLA ? ?Result Date: 01/22/2022 ?CLINICAL DATA:  Status post mastectomy on 12/06/2021 and implant placement on 01/01/2022. Breast swelling and redness. Evaluate for abscess. EXAM: ULTRASOUND OF THE right BREAST COMPARISON:  None FINDINGS: On physical exam, the breast was reported to be swelling, tender, and erythematous. Targeted ultrasound is performed, showing a fluid collection containing no liver echogenic debris. The collection measures approximately 5.6 x 4.0 cm and extends from the 9:00 to 11:00 position. No internal vascularity noted within this collection. No significant hyperemia or edema in the surrounding soft tissue. IMPRESSION: Minimally complex collection, likely a seroma. Developing abscess is not excluded. Clinical correlation is recommended. Electronically Signed   By: Anner Crete M.D.   On: 01/22/2022 19:29  ? ?[place] IMAGE GUIDED DRAINAGE BY  PERCUTANEOUS CATHETER ? ?Result Date: 01/23/2022 ?INDICATION: 36 year old female with a history of breast cancer status post bilateral mastectomy and reconstruction. She now has redness, induration and a large complex fluid collection surrounding the right reconstruction implant concerning for possible breast abscess. She presents for ultrasound-guided drain placement. EXAM: [place] IMAGE GUIDED DRAINAGE BY PERCUTANEOUS CATHETE MEDICATIONS: The patient is currently admitted to the hospital and receiving intravenous antibiotics. The antibiotics were administered within an appropriate time frame prior to the initiation of the procedure. ANESTHESIA/SEDATION: Fentanyl 4 mcg IV; Versed 150 mg IV Moderate Sedation Time:  15 minutes The patient was continuously monitored during the procedure by the interventional radiology nurse under my direct supervision. COMPLICATIONS: None immediate. PROCEDURE: Informed written consent was obtained from the patient after a thorough discussion of the procedural risks, benefits and alternatives. All questions were addressed. Maximal Sterile Barrier Technique was utilized including caps, mask, sterile gowns, sterile gloves, sterile drape, hand hygiene and skin antiseptic. A timeout was performed prior to the initiation of the procedure. The right breast was interrogated with ultrasound. A large complex fluid collection is visualized surrounding the implant prosthesis. A suitable  skin entry site at the superior margin of the implant was selected. Local anesthesia was attained by infiltration with 1% lidocaine. A small dermatotomy was made. Under real-time ultrasound guidance, an 18 gauge trocar needle was carefully advanced through the overlying tissue and into the fluid collection. Great care was taken to avoid the prosthesis. The needle was then navigated into the more dependent portion of the fluid collection. A 0.035 wire was then coiled in the collection. The skin tract was dilated to 12  [place]. A 12 French all-purpose drainage catheter modified with additional sideholes was selected and advanced over the wire. Aspiration was then performed yielding approximately 325 mL of purulent pink opaque flu

## 2022-01-25 NOTE — Anesthesia Preprocedure Evaluation (Addendum)
Anesthesia Evaluation  ? ? ?Reviewed: ?Allergy & Precautions, Patient's Chart, lab work & pertinent test results ? ?Airway ?Mallampati: II ? ?TM Distance: >3 FB ?Neck ROM: Full ? ? ? Dental ?no notable dental hx. ? ?  ?Pulmonary ?neg pulmonary ROS,  ?  ?Pulmonary exam normal ?breath sounds clear to auscultation ? ? ? ? ? ? Cardiovascular ?Exercise Tolerance: Good ?+ DVT (on xarelto, last dose 01/22/22)  ?Normal cardiovascular exam ?Rhythm:Regular Rate:Normal ? ? ?  ?Neuro/Psych ?Anxiety negative neurological ROS ?   ? GI/Hepatic ?negative GI ROS, Neg liver ROS,   ?Endo/Other  ?negative endocrine ROS ? Renal/GU ?negative Renal ROS  ? ?  ?Musculoskeletal ?negative musculoskeletal ROS ?(+)  ? Abdominal ?  ?Peds ?negative pediatric ROS ?(+)  Hematology ? ?(+) Blood dyscrasia, anemia ,   ?Anesthesia Other Findings ?Breast cancer ? Reproductive/Obstetrics ? ?  ? ? ? ? ? ? ? ? ? ? ? ? ? ?  ?  ? ? ? ? ? ? ? ?Anesthesia Physical ?Anesthesia Plan ? ?ASA: 3 ? ?Anesthesia Plan: General  ? ?Post-op Pain Management:   ? ?Induction: Intravenous ? ?PONV Risk Score and Plan: 3 and Treatment may vary due to age or medical condition, Scopolamine patch - Pre-op, Midazolam, Ondansetron and Dexamethasone ? ?Airway Management Planned: LMA ? ?Additional Equipment: None ? ?Intra-op Plan:  ? ?Post-operative Plan: Extubation in OR ? ?Informed Consent:  ? ?Plan Discussed with: CRNA, Anesthesiologist and Surgeon ? ?Anesthesia Plan Comments: (Last dose of lovenox 01/24/22 at 830pm.  GA/LMA. Norton Blizzard, MD  ?)  ? ? ? ? ? ?Anesthesia Quick Evaluation ? ?

## 2022-01-26 ENCOUNTER — Encounter: Payer: Self-pay | Admitting: Hematology and Oncology

## 2022-01-26 ENCOUNTER — Encounter (HOSPITAL_COMMUNITY): Payer: Self-pay | Admitting: Plastic Surgery

## 2022-01-26 ENCOUNTER — Ambulatory Visit: Payer: 59

## 2022-01-26 DIAGNOSIS — Z95828 Presence of other vascular implants and grafts: Secondary | ICD-10-CM | POA: Insufficient documentation

## 2022-01-26 DIAGNOSIS — N61 Mastitis without abscess: Secondary | ICD-10-CM | POA: Diagnosis not present

## 2022-01-26 MED ORDER — SULFAMETHOXAZOLE-TRIMETHOPRIM 800-160 MG PO TABS: 1.0000 | ORAL_TABLET | Freq: Two times a day (BID) | ORAL | Status: DC

## 2022-01-26 MED ORDER — SULFAMETHOXAZOLE-TRIMETHOPRIM 800-160 MG PO TABS: 1.0000 | ORAL_TABLET | Freq: Two times a day (BID) | ORAL | 0 refills | Status: AC

## 2022-01-26 NOTE — Progress Notes (Signed)
?PROGRESS NOTE ? ?Jenna Cross  ZYS:[id number] DOB: 12-05-1985 DOA: 01/22/2022 ?PCP: Dian Queen, MD  ? ?Brief Narrative: ?Patient is a 36 year old female with history of malignant neoplasm of the right breast status post neoadjuvant chemotherapy, bilateral mastectomy with breast reconstruction, history of DVT on Xarelto who presented to the emergency room for right breast redness, swelling, fever.  She was following with plastic surgery as an outpatient and was on oral antibiotic Bactrim which did not help.  She was sent to the emergency department for further evaluation and need for IV antibiotics.  She underwent IR guided drain placement for right breast abscess, plastic surgery did removal of right breast implant on 4/20.  Planning for discharge today with oral antibiotics.  She will follow-up with plastic surgery as an outpatient. ? ?Assessment & Plan: ? ?Principal Problem: ?  Infection of right breast ?Active Problems: ?  Malignant neoplasm of upper-outer quadrant of right breast in female, estrogen receptor positive ([place]) ?  Breast cancer ([place]) ?  History of DVT (deep vein thrombosis) ? ?Right breast infection: Presented with fever, edema and redness of the right breast.  Was following with plastic surgery as an outpatient, was on Bactrim.  Failed antibiotic outpatient therapy.   Blood cultures no growth till date. Wound cultures showed MSSA. She was  on vancomycin and ceftriaxone: changed to cefazoline. ?She underwent IR guided drain placement on right breast on 4/18. plastic surgery did removal of right breast implant on 4/20.  Planning for discharge today with oral antibiotics.  She will follow-up with plastic surgery as an outpatient. ? ?History of DVT: On Xarelto.  Currently on hold for surgery ? ?Right breast cancer: Estrogen positive.  Status post neoadjuvant chemotherapy, bilateral mastectomy with breast implant placement.  Follows with oncology, Dr. Lindi Adie ? ? ? ?  ?  ? ?DVT  prophylaxis:enoxaparin (LOVENOX) injection 30 mg Start: 01/24/22 2100 ? ? ?  Code Status: Prior ? ? ? ?Consultants: Plastic surgery ? ?Procedures: Drain placement ? ?Antimicrobials:  ?Anti-infectives (From admission, onward)  ? ? Start     Dose/Rate Route Frequency Ordered Stop  ? 01/26/22 1000  sulfamethoxazole-trimethoprim (BACTRIM DS) 800-160 MG per tablet 1 tablet       ? 1 tablet Oral Every 12 hours 01/26/22 0736 02/02/22 0959  ? 01/26/22 0000  sulfamethoxazole-trimethoprim (BACTRIM DS) 800-160 MG tablet       ? 1 tablet Oral 2 times daily 01/26/22 0733 02/02/22 2359  ? 01/26/22 0000  sulfamethoxazole-trimethoprim (BACTRIM DS) 800-160 MG tablet       ? 1 tablet Oral 2 times daily 01/26/22 0742 02/02/22 2359  ? 01/25/22 2000  ceFAZolin (ANCEF) IVPB 2g/100 mL premix       ? 2 g ?200 mL/hr over 30 Minutes Intravenous Every 8 hours 01/25/22 1133    ? 01/24/22 2145  valACYclovir (VALTREX) tablet 500 mg       ? 500 mg Oral  Once 01/24/22 2048 01/24/22 2154  ? 01/23/22 2100  valACYclovir (VALTREX) tablet 500 mg       ? 500 mg Oral  Once 01/23/22 2004 01/23/22 2146  ? 01/22/22 2300  valACYclovir (VALTREX) tablet 500 mg       ? 500 mg Oral  Once 01/22/22 2214 01/22/22 2229  ? 01/22/22 2200  vancomycin (VANCOREADY) IVPB 750 mg/150 mL  Status:  Discontinued       ? 750 mg ?150 mL/hr over 60 Minutes Intravenous Every 12 hours 01/22/22 1055 01/25/22 1020  ? 01/22/22 2000  cefTRIAXone (ROCEPHIN) 2 g in sodium chloride 0.9 % 100 mL IVPB  Status:  Discontinued       ? 2 g ?200 mL/hr over 30 Minutes Intravenous Every 24 hours 01/22/22 1918 01/25/22 1021  ? 01/22/22 1030  vancomycin (VANCOCIN) IVPB 1000 mg/200 mL premix       ? 1,000 mg ?200 mL/hr over 60 Minutes Intravenous  Once 01/22/22 1015 01/22/22 1152  ? ?  ? ? ?Subjective: ?Patient seen and examined the bedside this morning.  Hemodynamically stable for discharge today. ? ?Objective: ?Vitals:  ? 01/25/22 1810 01/25/22 1948 01/26/22 0502 01/26/22 0720  ?BP: 129/81 (!)  103/59 106/65 105/60  ?Pulse: 77 73 68 (!) 57  ?Resp: '18 16 18 16  '$ ?Temp: 97.7 ?F (36.5 ?C) 98.4 ?F (36.9 ?C) 98.7 ?F (37.1 ?C) 98 ?F (36.7 ?C)  ?TempSrc: Oral Oral Oral Oral  ?SpO2: 96% 94% 98% 97%  ?Weight:      ?Height:      ? ? ?Intake/Output Summary (Last 24 hours) at 01/26/2022 0910 ?Last data filed at 01/26/2022 0501 ?Gross per 24 hour  ?Intake 1411 ml  ?Output 160 ml  ?Net 1251 ml  ? ?Filed Weights  ? 01/22/22 0955 01/25/22 1511  ?Weight: 59.9 kg 59.9 kg  ? ? ?Examination: ? ?General exam: Overall comfortable, not in distress ?HEENT: PERRL ?Respiratory system:  no wheezes or crackles  ?Cardiovascular system: S1 & S2 heard, RRR.  ?Gastrointestinal system: Abdomen is nondistended, soft and nontender. ?Central nervous system: Alert and oriented ?Extremities: No edema, no clubbing ,no cyanosis ?Skin: No rashes, no ulcers,no icterus   ?Breast: Drain on the right breast ? ?Data Reviewed: I have personally reviewed following labs and imaging studies ? ?CBC: ?Recent Labs  ?Lab 01/22/22 ?1001 01/24/22 ?0040  ?WBC 7.0 8.9  ?NEUTROABS 4.6  --   ?HGB 12.8 11.1*  ?HCT 38.6 35.0*  ?MCV 95.3 98.6  ?PLT 184 195  ? ?Basic Metabolic Panel: ?Recent Labs  ?Lab 01/22/22 ?1001 01/23/22 ?0114 01/23/22 ?1120  ?NA 136 135 134*  ?K 3.3* 3.7 3.7  ?CL 98 102 102  ?CO2 '27 22 23  '$ ?GLUCOSE 99 99 112*  ?BUN '11 8 7  '$ ?CREATININE 0.77 0.71 0.74  ?CALCIUM 9.2 8.0* 8.0*  ? ? ? ?Recent Results (from the past 240 hour(s))  ?Culture, blood (Routine x 2)     Status: None (Preliminary result)  ? Collection Time: 01/22/22 10:01 AM  ? Specimen: Left Antecubital; Blood  ?Result Value Ref Range Status  ? Specimen Description   Final  ?  LEFT ANTECUBITAL ?Performed at [facility], [address], [place], [place] [address] ?  ? Special Requests   Final  ?  BOTTLES DRAWN AEROBIC AND ANAEROBIC Blood Culture adequate volume ?Performed at [facility], [address], [place], [place] [address] ?  ? Culture   Final  ?   NO GROWTH 4 DAYS ?Performed at [facility], [place] [address] [place], [place] [address] ?  ? Report Status PENDING  Incomplete  ?Culture, blood (Routine x 2)     Status: None (Preliminary result)  ? Collection Time: 01/22/22 10:20 AM  ? Specimen: BLOOD LEFT WRIST  ?Result Value Ref Range Status  ? Specimen Description   Final  ?  BLOOD LEFT WRIST ?Performed at [facility], [address] [place], [place] [address] ?  ? Special Requests   Final  ?  BOTTLES DRAWN AEROBIC AND ANAEROBIC Blood Culture results may not  be optimal due to an inadequate volume of blood received in culture bottles ?Performed at [facility], [address] [place], [place] [address] ?  ? Culture   Final  ?  NO GROWTH 4 DAYS ?Performed at [facility], [place] [address] [place], [place] [address] ?  ? Report Status PENDING  Incomplete  ?Surgical pcr screen     Status: Abnormal  ? Collection Time: 01/23/22  5:40 AM  ? Specimen: Nasal Mucosa; Nasal Swab  ?Result Value Ref Range Status  ? MRSA, PCR NEGATIVE NEGATIVE Final  ? Staphylococcus aureus POSITIVE (A) NEGATIVE Final  ?  Comment: (NOTE) ?The Xpert SA Assay (FDA approved for NASAL specimens in patients 50 ?years of age and older), is one component of a comprehensive ?surveillance program. It is not intended to diagnose infection nor to ?guide or monitor treatment. ?Performed at [facility], [place] [address] [place], [place] ?[address] ?  ?Aerobic/Anaerobic Culture w Gram Stain (surgical/deep wound)     Status: None (Preliminary result)  ? Collection Time: 01/23/22 11:26 AM  ? Specimen: Abscess  ?Result Value Ref Range Status  ? Specimen Description ABSCESS  Final  ? Special Requests Normal  Final  ? Gram Stain   Final  ?  ABUNDANT WBC PRESENT, PREDOMINANTLY PMN ?ABUNDANT GRAM POSITIVE COCCI IN CLUSTERS ?Performed at [facility], [place] [address], [place], [place] [address] ?  ? Culture   Final  ?  ABUNDANT STAPHYLOCOCCUS  AUREUS ?NO ANAEROBES ISOLATED; CULTURE IN PROGRESS FOR 5 DAYS ?  ? Report Status PENDING  Incomplete  ? Organism ID, Bacteria STAPHYLOCOCCUS AUREUS  Final  ?    Susceptibility  ? Staphylococcus aureus - MIC*  ?  CIPROFLOXACIN

## 2022-01-26 NOTE — Discharge Summary (Signed)
Physician Discharge Summary  ?Patient ID: ?Jenna Cross ?MRN: [id number] ?DOB/AGE: 1985/11/04 36 y.o. ? ?Admit date: 01/22/2022 ?Discharge date: 01/26/2022 ? ?Admission Diagnoses: ? ?Discharge Diagnoses:  ?Principal Problem: ?  Infection of right breast ?Active Problems: ?  Malignant neoplasm of upper-outer quadrant of right breast in female, estrogen receptor positive ([place]enna Cross) ?  Breast cancer ([name]) ?  History of DVT (deep vein thrombosis) ? ? ?Discharged Condition: good ? ?Hospital Course: The patient was admitted through the emergency room after visit in the office.  It was noted that her right breast was markedly swollen and red.  Radiology placed a drain and was able to remove the fluid that was cultured.  Culture shows sensitivity to Bactrim.  There was improvement in the swelling.  However due to concerns of getting the patient to radiology for radiation the patient decided for removal of implant.  This was done and a drain was placed.  The patient felt much better and eager to return home.  At the time of discharge her implant was removed the swelling was resolved and the redness markedly improved ? ?Consults:  [facility] hospitalist ? ?Significant Diagnostic Studies: Ultrasound with drain placement ? ?Treatments: surgery: Removal of right breast implant ? ?Discharge Exam: ?Blood pressure 105/60, pulse (!) 57, temperature 98 ?F (36.7 ?C), temperature source Oral, resp. rate 16, height '5\' 4"'$  (1.626 m), weight 59.9 kg, SpO2 97 %, not currently breastfeeding. ?Head: Normocephalic, without obvious abnormality, atraumatic ?Breasts: normal appearance, no masses or tenderness ?Incision/Wound: Marked improvement in right breast redness.  Drain in place and working. ? ?Disposition: Discharge disposition: 01-Home or Self Care ? ? ? ? ? ? ? ?Allergies as of 01/26/2022   ? ?   Reactions  ? Wound Dressings Other (See Comments)  ? Honey comb dressing causes redness and inflammation   ? ?  ? ?  ?Medication List  ?  ? ?STOP  taking these medications   ? ?enoxaparin 60 MG/0.6ML injection ?Commonly known as: Lovenox ?  ?lidocaine-prilocaine cream ?Commonly known as: EMLA ?  ?triamcinolone 0.025 % ointment ?Commonly known as: KENALOG ?  ? ?  ? ?TAKE these medications   ? ?acetaminophen 500 MG tablet ?Commonly known as: TYLENOL ?Take 1,000 mg by mouth every 6 (six) hours as needed for mild pain or headache. ?  ?ALPRAZolam 0.5 MG tablet ?Commonly known as: Duanne Moron ?Take 0.5 mg by mouth 3 (three) times daily as needed for anxiety. ?  ?diphenoxylate-atropine 2.5-0.025 MG tablet ?Commonly known as: LOMOTIL ?Take 1 tablet by mouth 3 (three) times daily as needed for diarrhea or loose stools. ?  ?escitalopram 20 MG tablet ?Commonly known as: LEXAPRO ?Take 20 mg by mouth every evening. ?  ?loratadine 10 MG tablet ?Commonly known as: CLARITIN ?Take 10 mg by mouth daily as needed for allergies. ?  ?magic mouthwash w/lidocaine Soln ?Take 5 mLs by mouth 3 (three) times daily as needed for mouth pain. ?  ?methocarbamol 500 MG tablet ?Commonly known as: Robaxin ?Take 1 tablet (500 mg total) by mouth every 12 (twelve) hours as needed for up to 10 days for muscle spasms. ?  ?rivaroxaban 20 MG Tabs tablet ?Commonly known as: XARELTO ?Take 1 tablet (20 mg total) by mouth daily with supper. ?What changed: when to take this ?  ?sulfamethoxazole-trimethoprim 800-160 MG tablet ?Commonly known as: Bactrim DS ?Take 1 tablet by mouth 2 (two) times daily for 7 days. ?What changed: additional instructions ?  ?sulfamethoxazole-trimethoprim 800-160 MG tablet ?Commonly known as: BACTRIM DS ?  Take 1 tablet by mouth 2 (two) times daily for 7 days. ?What changed: You were already taking a medication with the same name, and this prescription was added. Make sure you understand how and when to take each. ?  ?valACYclovir 500 MG tablet ?Commonly known as: VALTREX ?Take 500 mg by mouth every evening. ?  ? ?  ? ? Follow-up Information   ? ? Jenna Cross, Jenna Lofty, DO Follow up in  10 day(s).   ?Specialty: Plastic Surgery ?Contact information: ?[place] [address] ?[place] [address] ?[phone] ? ? ?  ?  ? ?  ?  ? ?  ? ? ?Signed: ?Jenna Cross Jenna Cross ?01/26/2022, 7:42 AM ? ? ?

## 2022-01-27 LAB — CULTURE, BLOOD (ROUTINE X 2)
Culture: NO GROWTH
Culture: NO GROWTH
Special Requests: ADEQUATE

## 2022-01-28 LAB — AEROBIC/ANAEROBIC CULTURE W GRAM STAIN (SURGICAL/DEEP WOUND): Special Requests: NORMAL

## 2022-01-28 NOTE — Anesthesia Postprocedure Evaluation (Signed)
Anesthesia Post Note ? ?Patient: Jenna Cross ? ?Procedure(s) Performed: BREAST IMPLANT REMOVAL (Right: Breast) ? ?  ? ?Patient location during evaluation: PACU ?Anesthesia Type: General ?Level of consciousness: awake and alert ?Pain management: pain level controlled ?Vital Signs Assessment: post-procedure vital signs reviewed and stable ?Respiratory status: spontaneous breathing, nonlabored ventilation, respiratory function stable and patient connected to nasal cannula oxygen ?Cardiovascular status: blood pressure returned to baseline and stable ?Postop Assessment: no apparent nausea or vomiting ?Anesthetic complications: no ? ? ?No notable events documented. ? ?Last Vitals:  ?Vitals:  ? 01/26/22 0502 01/26/22 0720  ?BP: 106/65 105/60  ?Pulse: 68 (!) 57  ?Resp: 18 16  ?Temp: 37.1 ?C 36.7 ?C  ?SpO2: 98% 97%  ?  ?Last Pain:  ?Vitals:  ? 01/26/22 0832  ?TempSrc:   ?PainSc: 0-No pain  ? ? ?  ?  ?  ?  ?  ?  ? ?Terriah Reggio ? ? ? ? ?

## 2022-01-29 ENCOUNTER — Inpatient Hospital Stay: Payer: 59 | Admitting: Hematology and Oncology

## 2022-01-29 ENCOUNTER — Encounter: Payer: Self-pay | Admitting: Hematology and Oncology

## 2022-01-29 ENCOUNTER — Ambulatory Visit: Payer: 59 | Admitting: Radiation Oncology

## 2022-01-29 ENCOUNTER — Telehealth: Payer: Self-pay | Admitting: *Deleted

## 2022-01-29 ENCOUNTER — Inpatient Hospital Stay: Payer: 59

## 2022-01-29 ENCOUNTER — Encounter: Payer: Self-pay | Admitting: *Deleted

## 2022-01-29 DIAGNOSIS — Z95828 Presence of other vascular implants and grafts: Secondary | ICD-10-CM

## 2022-01-29 NOTE — Telephone Encounter (Signed)
CALLED PATIENT TO INFORM OF FU APPT. WITH DR. KINARD ON 02-06-22 @ 11:30 AM, UNABLE TO LEAVE MESSAGE DUE TO VM BEING FULL, WILL CALL LATER. ?

## 2022-01-30 ENCOUNTER — Telehealth: Payer: Self-pay | Admitting: Hematology and Oncology

## 2022-01-30 ENCOUNTER — Encounter: Payer: 59 | Admitting: Surgical

## 2022-01-30 ENCOUNTER — Ambulatory Visit: Payer: 59

## 2022-01-30 DIAGNOSIS — C50411 Malignant neoplasm of upper-outer quadrant of right female breast: Secondary | ICD-10-CM

## 2022-01-30 NOTE — Telephone Encounter (Signed)
.  Called patient to schedule appointment per 4/25, unable to leave msg  ?

## 2022-01-30 NOTE — Research (Addendum)
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 ?Connected with patient via phone for ePRO set-up, adverse event follow-up + cycle 1 planning. ? ?RESEARCH: Provided patient with verbal instructions for setting up Patient Cloud app for [facility]. In addition, written instructions provided via email. Patient informed of schedule for completing ePROs. She verbalized understanding.  ? ?Clarified substance use history with patient. She has never been a smoker.   ? ?Also followed-up with patient regarding recent adverse event and treatment delay. Patient contacted her plastic surgeon on 01/18/22 and reported tenderness and erythema to right breast, as well as intermittent fevers. Plastic surgeon recommended treatment with oral antibiotics. See clinic note dated 01/19/22. Patient had follow-up appointment with plastic surgeon on Monday, 01/22/22. It was recommended that patient go to the ED for admission with IV antibiotics and surgical management of breast infection (grade 3). See clinic note dated 01/22/22. While admitted, patient received IV antibiotics, had a drain placed by interventional radiology, and had her right breast implant removed by plastic surgery. Patient discharged 01/26/22. See discharge summary dated 01/26/22. Serious adverse event reported per protocol. See table below. ? ?ADVERSE EVENT LOG: ?Study/Protocol: [facility] [id number] CompassHER2 ?Cycle: Pre-C1D1 ? ?Event Grade ?CTCAE v5.0 Onset Date Resolved Date Attribution to protocol therapy? Treatment Comments Expedited reporting required?  ?Breast infection 3 01/18/2022 Ongoing Unrelated IV antibiotics + surgical wash-out, followed by oral antibiotics Admitted from 4/17 - 01/26/2022 as directed by plastic surgery team Yes; within 10 calendar days  ? ? ?PLAN: Dr Lindi Adie recommended delaying treatment while patient recovers from infection. Study team notified and appropriate documentation  filed. The delayed C1D1 was initially scheduled for 02/22/22 but moved to 02/15/22 per patient request. C1D1 will consist of a port flush with labs, a visit with Dr Lindi Adie, and an infusion. Research will meet with patient and accompany her to appointments on C1D1. Patient thanked for her time and continued voluntary participation in this study. Patient has been provided direct contact information and is encouraged to contact this nurse for any questions or concerns.  ? ?Vickii Penna, RN, BSN, CPN ?Clinical Research Nurse I ?[phone] ? ?01/30/2022 2:19 PM ? ?

## 2022-01-31 ENCOUNTER — Ambulatory Visit: Payer: 59

## 2022-02-01 ENCOUNTER — Telehealth: Payer: Self-pay | Admitting: Surgical

## 2022-02-01 ENCOUNTER — Other Ambulatory Visit: Payer: Self-pay

## 2022-02-01 ENCOUNTER — Telehealth: Payer: Self-pay

## 2022-02-01 ENCOUNTER — Ambulatory Visit: Payer: 59

## 2022-02-01 MED ORDER — SULFAMETHOXAZOLE-TRIMETHOPRIM 800-160 MG PO TABS: 1.0000 | ORAL_TABLET | Freq: Two times a day (BID) | ORAL | 0 refills | Status: AC

## 2022-02-01 NOTE — Telephone Encounter (Signed)
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 ?Connected with patient via phone to discuss scheduling. Initially planned to collect baseline research labs on C1D1, but with treatment delay, date is now out of window (window ends 02/13/22). Patient agreeable to lab appointment on 02/06/22 when she is here to see rad-onc. Lab appointment made; orders placed. Next visit will be C1D1 on 02/15/22. ? ?Patient thanked for her time and continued voluntary participation in this study. Patient has been provided with direct contact information and is encouraged to contact this nurse with any questions or concerns. ? ?Vickii Penna, RN, BSN, CPN ?Clinical Research Nurse I ?[phone] ? ?02/01/2022 1:03 PM ? ? ? ?

## 2022-02-01 NOTE — Telephone Encounter (Signed)
Called pt to discuss culture results, would like to extend abx (bactrim) for 6 addtl days until follow up in office. No answer. Rx sent to pharmacy ?

## 2022-02-01 NOTE — Progress Notes (Incomplete)
? ?Patient Care Team: ?Dian Queen, MD as PCP - General (Obstetrics and Gynecology) ?Dian Queen, MD (Obstetrics and Gynecology) ?Mauro Kaufmann, RN as Oncology Nurse Navigator ?Rockwell Germany, RN as Oncology Nurse Navigator ?Nicholas Lose, MD as Consulting Physician (Hematology and Oncology) ?Gery Pray, MD as Consulting Physician (Radiation Oncology) ?Rolm Bookbinder, MD as Consulting Physician (General Surgery) ? ?DIAGNOSIS: No diagnosis found. ? ?SUMMARY OF ONCOLOGIC HISTORY: ?Oncology History  ?Malignant neoplasm of upper-outer quadrant of right breast in female, estrogen receptor positive ([place])  ?06/23/2021 Initial Diagnosis  ? Palpable lump in the right breast. Diagnostic mammogram and [place]: a 3.8 cm mass at the retroareolar right breast, 1 prominent right axillary lymph node with a 4 mm cortex, 1 other which is borderline measuring 3 mm. 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  3 o'clock position: Biopsy fibroadenoma ?  ?07/12/2021 -  Neo-Adjuvant Chemotherapy  ? Neoadjuvant chemotherapy with [place] Perjeta ?6 cycles followed by Herceptin Perjeta maintenance versus Kadcyla maintenance (based on response to neoadjuvant chemo) for 1 year ?  ?07/28/2021 Genetic Testing  ? Negative hereditary cancer genetic testing: no pathogenic variants detected in [facility] CancerNext-Expanded +RNAinsight Panel.  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  The report date is July 28, 2021.  ? ?The CancerNext-Expanded gene panel offered by [facility] and includes sequencing, rearrangement, and RNA analysis for the following 77 genes: AIP, ALK, APC, ATM, AXIN2, BAP1, BARD1, BLM, BMPR1A, BRCA1, BRCA2, BRIP1, CDC73, CDH1, CDK4, CDKN1B, CDKN2A, CHEK2, CTNNA1, DICER1, FANCC, FH, FLCN, GALNT12, KIF1B, LZTR1, MAX, MEN1, MET, MLH1, MSH2, MSH3, MSH6, MUTYH, NBN, NF1, NF2, NTHL1, PALB2, PHOX2B, PMS2, POT1,  PRKAR1A, PTCH1, PTEN, RAD51C, RAD51D, RB1, RECQL, RET, SDHA, SDHAF2, SDHB, SDHC, SDHD, SMAD4, SMARCA4, SMARCB1, SMARCE1, STK11, SUFU, TMEM127, TP53, TSC1, TSC2, VHL and XRCC2 (sequencing and deletion/duplication); EGFR, EGLN1, HOXB13, KIT, MITF, PDGFRA, POLD1, and POLE (sequencing only); EPCAM and GREM1 (deletion/duplication only).  ?  ?12/06/2021 Surgery  ? Bilateral Mastectomies: ?Left mastectomy: Benign ?Right mastectomy: Multifocal ILC 1.8 cm, 0.9 cm, margins negative, 1/2 lymph nodes positive, ER 50%, PR 30%, HER2 1+ IHC ?  ?12/26/2021 - 12/29/2021 Chemotherapy  ? Patient is on Treatment Plan : BREAST ADO-Trastuzumab Emtansine (Kadcyla) q21d  ? ?  ?  ? ? ?CHIEF COMPLIANT: Follow-up of right breast cancer  ?  ? ?INTERVAL HISTORY: Jenna Cross is a ? ? ?ALLERGIES:  is allergic to wound dressings. ? ?MEDICATIONS:  ?Current Outpatient Medications  ?Medication Sig Dispense Refill  ? acetaminophen (TYLENOL) 500 MG tablet Take 1,000 mg by mouth every 6 (six) hours as needed for mild pain or headache.    ? ALPRAZolam (XANAX) 0.5 MG tablet Take 0.5 mg by mouth 3 (three) times daily as needed for anxiety. (Patient not taking: Reported on 01/22/2022)    ? diphenoxylate-atropine (LOMOTIL) 2.5-0.025 MG tablet Take 1 tablet by mouth 3 (three) times daily as needed for diarrhea or loose stools. (Patient not taking: Reported on 01/22/2022) 30 tablet 3  ? escitalopram (LEXAPRO) 20 MG tablet Take 20 mg by mouth every evening.    ? loratadine (CLARITIN) 10 MG tablet Take 10 mg by mouth daily as needed for allergies.    ? magic mouthwash w/lidocaine SOLN Take 5 mLs by mouth 3 (three) times daily as needed for mouth pain. (Patient not taking: Reported on 01/22/2022) 100 mL 0  ? rivaroxaban (XARELTO) 20 MG TABS tablet Take 1 tablet (20 mg total) by mouth daily with  supper. (Patient taking differently: Take 20 mg by mouth daily.) 30 tablet 5  ? sulfamethoxazole-trimethoprim (BACTRIM DS) 800-160 MG tablet Take 1 tablet by mouth 2 (two)  times daily for 7 days. 14 tablet 0  ? sulfamethoxazole-trimethoprim (BACTRIM DS) 800-160 MG tablet Take 1 tablet by mouth 2 (two) times daily for 7 days. 14 tablet 0  ? valACYclovir (VALTREX) 500 MG tablet Take 500 mg by mouth every evening.    ? ?No current facility-administered medications for this visit.  ? ? ?PHYSICAL EXAMINATION: ?ECOG PERFORMANCE STATUS: {CHL ONC ECOG WL:[id number]} ? ?There were no vitals filed for this visit. ?There were no vitals filed for this visit. ? ?BREAST:*** No palpable masses or nodules in either right or left breasts. No palpable axillary supraclavicular or infraclavicular adenopathy no breast tenderness or nipple discharge. (exam performed in the presence of a chaperone) ? ?LABORATORY DATA:  ?I have reviewed the data as listed ? ?  Latest Ref Rng & Units 01/23/2022  ? 11:20 AM 01/23/2022  ?  1:14 AM 01/22/2022  ? 10:01 AM  ?CMP  ?Glucose 70 - 99 mg/dL 112   99   99    ?BUN 6 - 20 mg/dL 7   8   11     ?Creatinine 0.44 - 1.00 mg/dL 0.74   0.71   0.77    ?Sodium 135 - 145 mmol/L 134   135   136    ?Potassium 3.5 - 5.1 mmol/L 3.7   3.7   3.3    ?Chloride 98 - 111 mmol/L 102   102   98    ?CO2 22 - 32 mmol/L 23   22   27     ?Calcium 8.9 - 10.3 mg/dL 8.0   8.0   9.2    ?Total Protein 6.5 - 8.1 g/dL   7.2    ?Total Bilirubin 0.3 - 1.2 mg/dL   0.3    ?Alkaline Phos 38 - 126 U/L   75    ?AST 15 - 41 U/L   11    ?ALT 0 - 44 U/L   12    ? ? ?Lab Results  ?Component Value Date  ? WBC 8.9 01/24/2022  ? HGB 11.1 (L) 01/24/2022  ? HCT 35.0 (L) 01/24/2022  ? MCV 98.6 01/24/2022  ? PLT 195 01/24/2022  ? NEUTROABS 4.6 01/22/2022  ? ? ?ASSESSMENT & PLAN:  ?No problem-specific Assessment & Plan notes found for this encounter. ? ? ? ?No orders of the defined types were placed in this encounter. ? ?The patient has a good understanding of the overall plan. she agrees with it. she will call with any problems that may develop before the next visit here. ?Total time spent: 30 mins including face to face time  and time spent for planning, charting and co-ordination of care ? ? Suzzette Righter, CMA ?02/01/22 ? ? ? I Gardiner Coins am scribing for Dr. Lindi Adie ? ?***  ?

## 2022-02-02 ENCOUNTER — Ambulatory Visit: Payer: 59

## 2022-02-05 ENCOUNTER — Ambulatory Visit: Payer: 59

## 2022-02-05 ENCOUNTER — Encounter: Payer: Self-pay | Admitting: Hematology and Oncology

## 2022-02-05 NOTE — Progress Notes (Signed)
?Radiation Oncology         [phone] ?________________________________ ? ?Name: Jenna Cross MRN: [id number]  ?Date: 02/06/2022  DOB: Oct 20, 1985 ? ?Follow-Up Visit Note ? ?CC: Dian Queen, MD  Nicholas Lose, MD ? ?  ICD-10-CM   ?1. Malignant neoplasm of upper-outer quadrant of right breast in female, estrogen receptor positive ([place])  C50.411   ? Z17.0   ?  ? ? ?Diagnosis: S/p bilateral mastectomies and neo-adjuvant chemotherapy:  Stage IB (cT2, cN1, cM0) Right Breast UOQ, Invasive Lobular Carcinoma, ER+ / PR+ / Her2-, Grade 2 ? ?Narrative:  The patient returns today for follow-up. To review, the patient was last seen for re-evaluation on 01/01/22 following bilateral mastectomies and completion of neo-adjuvant chemotherapy. The patient was scheduled for CT sim on the date of our last visit but in light of her implant exchange later that day, we postponed simulation until 01/10/22.      ? ?During a post-op follow up with plastic surgery on 01/09/22, the patient reported feeling well and was noted to be very happy with her reconstruction. She did note some irritation to both breasts related to her dressings, but otherwise denied any complaints           ? ?However, during a post-op follow-up on 01/22/22, the patient's right breast was found to be markedly swollen and red and was was admitted for further evaluation. Ultrasound of the right breast performed showed a presumed seroma. The area was aspirated and the fluid was cultured showing sensitivity to Bactrim. Her swelling improved following drainage, however, due to concerns of getting the patient in for radiation, she opted for removal of the implant on 01/25/22. Following removal of the implant,  her swelling and redness resolved and she was cleared for discharge on 01/26/22.           ? ?She returns today for follow-up.  She continues to have some numbness along the right central chest wall area since her surgeries.  She denies any problems with  swelling in the right arm or hand.  She continues to have a JP drain in place but is scheduled to see Dr. Eusebio Friendly PA tomorrow with drainage tube likely to be removed. ? ?Allergies:  is allergic to wound dressings. ? ?Meds: ?Current Outpatient Medications  ?Medication Sig Dispense Refill  ? acetaminophen (TYLENOL) 500 MG tablet Take 1,000 mg by mouth every 6 (six) hours as needed for mild pain or headache.    ? ALPRAZolam (XANAX) 0.5 MG tablet Take 0.5 mg by mouth 3 (three) times daily as needed for anxiety.    ? escitalopram (LEXAPRO) 20 MG tablet Take 20 mg by mouth every evening.    ? loratadine (CLARITIN) 10 MG tablet Take 10 mg by mouth daily as needed for allergies.    ? rivaroxaban (XARELTO) 20 MG TABS tablet Take 1 tablet (20 mg total) by mouth daily with supper. (Patient taking differently: Take 20 mg by mouth daily.) 30 tablet 5  ? sulfamethoxazole-trimethoprim (BACTRIM DS) 800-160 MG tablet Take 1 tablet by mouth 2 (two) times daily for 6 days. 12 tablet 0  ? valACYclovir (VALTREX) 500 MG tablet Take 500 mg by mouth every evening.    ? diphenoxylate-atropine (LOMOTIL) 2.5-0.025 MG tablet Take 1 tablet by mouth 3 (three) times daily as needed for diarrhea or loose stools. (Patient not taking: Reported on 01/22/2022) 30 tablet 3  ? magic mouthwash w/lidocaine SOLN Take 5 mLs by mouth 3 (three) times daily as needed for  mouth pain. (Patient not taking: Reported on 01/22/2022) 100 mL 0  ? ?No current facility-administered medications for this encounter.  ? ? ?Physical Findings: ?The patient is in no acute distress. Patient is alert and oriented. ? height is 5' 4"  (1.626 m) and weight is 129 lb 6.4 oz (58.7 kg). Her temperature is 97.6 ?F (36.4 ?C). Her blood pressure is 103/60 and her pulse is 100. Her respiration is 18 and oxygen saturation is 100%. .  Lungs are clear to auscultation bilaterally. Heart has regular rate and rhythm. No palpable cervical, supraclavicular, or axillary adenopathy. Abdomen soft,  non-tender, normal bowel sounds. ? ?Right chest wall area reveals mastectomy scar with some redundant tissue in light of implant being removed.  No signs of infection at this time.  Drainage tube in place along the right lateral chest with some slight erythema at the tube insertion but no signs of infection. ? ? ? ?Lab Findings: ?Lab Results  ?Component Value Date  ? WBC 8.9 01/24/2022  ? HGB 11.1 (L) 01/24/2022  ? HCT 35.0 (L) 01/24/2022  ? MCV 98.6 01/24/2022  ? PLT 195 01/24/2022  ? ? ?Radiographic Findings: ?DG Chest Portable 1 View ? ?Result Date: 01/22/2022 ?CLINICAL DATA:  Provided history: Breast implant infection, right. EXAM: PORTABLE CHEST 1 VIEW COMPARISON:  Report from prior chest radiograph 07/11/2021 (images unavailable). FINDINGS: Left chest infusion port catheter with tip projecting at the level of the lower SVC. Heart size within normal limits. No appreciable airspace consolidation. No evidence of pleural effusion or pneumothorax. No acute bony abnormality identified. Surgical clips project in the region of the bilateral chest and right axilla. Thoracic levocurvature. IMPRESSION: No evidence of acute cardiopulmonary abnormality. Electronically Signed   By: Kellie Simmering D.O.   On: 01/22/2022 10:57  ? ?US BREAST LTD UNI RIGHT INC AXILLA ? ?Result Date: 01/22/2022 ?CLINICAL DATA:  Status post mastectomy on 12/06/2021 and implant placement on 01/01/2022. Breast swelling and redness. Evaluate for abscess. EXAM: ULTRASOUND OF THE right BREAST COMPARISON:  None FINDINGS: On physical exam, the breast was reported to be swelling, tender, and erythematous. Targeted ultrasound is performed, showing a fluid collection containing no liver echogenic debris. The collection measures approximately 5.6 x 4.0 cm and extends from the 9:00 to 11:00 position. No internal vascularity noted within this collection. No significant hyperemia or edema in the surrounding soft tissue. IMPRESSION: Minimally complex collection,  likely a seroma. Developing abscess is not excluded. Clinical correlation is recommended. Electronically Signed   By: Anner Crete M.D.   On: 01/22/2022 19:29  ? ?[place] IMAGE GUIDED DRAINAGE BY PERCUTANEOUS CATHETER ? ?Result Date: 01/23/2022 ?INDICATION: 36 year old female with a history of breast cancer status post bilateral mastectomy and reconstruction. She now has redness, induration and a large complex fluid collection surrounding the right reconstruction implant concerning for possible breast abscess. She presents for ultrasound-guided drain placement. EXAM: [place] IMAGE GUIDED DRAINAGE BY PERCUTANEOUS CATHETE MEDICATIONS: The patient is currently admitted to the hospital and receiving intravenous antibiotics. The antibiotics were administered within an appropriate time frame prior to the initiation of the procedure. ANESTHESIA/SEDATION: Fentanyl 4 mcg IV; Versed 150 mg IV Moderate Sedation Time:  15 minutes The patient was continuously monitored during the procedure by the interventional radiology nurse under my direct supervision. COMPLICATIONS: None immediate. PROCEDURE: Informed written consent was obtained from the patient after a thorough discussion of the procedural risks, benefits and alternatives. All questions were addressed. Maximal Sterile Barrier Technique was utilized including caps, mask, sterile gowns,  sterile gloves, sterile drape, hand hygiene and skin antiseptic. A timeout was performed prior to the initiation of the procedure. The right breast was interrogated with ultrasound. A large complex fluid collection is visualized surrounding the implant prosthesis. A suitable skin entry site at the superior margin of the implant was selected. Local anesthesia was attained by infiltration with 1% lidocaine. A small dermatotomy was made. Under real-time ultrasound guidance, an 18 gauge trocar needle was carefully advanced through the overlying tissue and into the fluid collection. Great care was taken  to avoid the prosthesis. The needle was then navigated into the more dependent portion of the fluid collection. A 0.035 wire was then coiled in the collection. The skin tract was dilated to 12 [place]. A 1

## 2022-02-06 ENCOUNTER — Ambulatory Visit
Admission: RE | Admit: 2022-02-06 | Discharge: 2022-02-06 | Disposition: A | Discharge status: Home / Self Care | Payer: 59 | Source: Ambulatory Visit | Attending: Radiation Oncology | Admitting: Radiation Oncology

## 2022-02-06 ENCOUNTER — Inpatient Hospital Stay: Payer: 59

## 2022-02-06 ENCOUNTER — Other Ambulatory Visit: Payer: Self-pay

## 2022-02-06 ENCOUNTER — Encounter: Payer: Self-pay | Admitting: Radiation Oncology

## 2022-02-06 ENCOUNTER — Ambulatory Visit: Payer: 59

## 2022-02-06 VITALS — BP 103/60 | HR 100 | Temp 97.6°F | Resp 18 | Ht 64.0 in | Wt 129.4 lb

## 2022-02-06 DIAGNOSIS — C50411 Malignant neoplasm of upper-outer quadrant of right female breast: Secondary | ICD-10-CM | POA: Insufficient documentation

## 2022-02-06 DIAGNOSIS — Z9221 Personal history of antineoplastic chemotherapy: Secondary | ICD-10-CM | POA: Insufficient documentation

## 2022-02-06 DIAGNOSIS — Z170 Estrogen receptor positive status [ER+]: Secondary | ICD-10-CM | POA: Insufficient documentation

## 2022-02-06 DIAGNOSIS — Z5111 Encounter for antineoplastic chemotherapy: Secondary | ICD-10-CM | POA: Insufficient documentation

## 2022-02-06 DIAGNOSIS — Z7901 Long term (current) use of anticoagulants: Secondary | ICD-10-CM | POA: Diagnosis not present

## 2022-02-06 DIAGNOSIS — Z79899 Other long term (current) drug therapy: Secondary | ICD-10-CM | POA: Insufficient documentation

## 2022-02-06 DIAGNOSIS — Z006 Encounter for examination for normal comparison and control in clinical research program: Secondary | ICD-10-CM | POA: Insufficient documentation

## 2022-02-06 DIAGNOSIS — Z9013 Acquired absence of bilateral breasts and nipples: Secondary | ICD-10-CM | POA: Insufficient documentation

## 2022-02-06 DIAGNOSIS — Z923 Personal history of irradiation: Secondary | ICD-10-CM | POA: Insufficient documentation

## 2022-02-06 LAB — RESEARCH LABS

## 2022-02-06 NOTE — Progress Notes (Signed)
Patient in for skin check post bilateral mastectomies .  ?  ?Breast Side Right ? ? ?Does the patient complain of any of the following: ?Breast Tenderness: Yes ?Breast Swelling: No ?Lymphadema: No ?Appetite good/fair/poor: Good ? ?Additional comments if applicable: Continues to have numbness to right chest wall. Patient noted to have drainage tube from right chest wall. No signs of infection present.  ? ?Vitals:  ? 02/06/22 1124  ?BP: 103/60  ?Pulse: 100  ?Resp: 18  ?Temp: 97.6 ?F (36.4 ?C)  ?SpO2: 100%  ?Weight: 129 lb 6.4 oz (58.7 kg)  ?Height: '5\' 4"'$  (1.626 m)  ?  ?

## 2022-02-07 ENCOUNTER — Ambulatory Visit: Payer: 59

## 2022-02-07 ENCOUNTER — Encounter: Payer: Self-pay | Admitting: *Deleted

## 2022-02-07 ENCOUNTER — Ambulatory Visit (INDEPENDENT_AMBULATORY_CARE_PROVIDER_SITE_OTHER): Payer: 59 | Admitting: Surgical

## 2022-02-07 ENCOUNTER — Telehealth: Payer: Self-pay | Admitting: Hematology and Oncology

## 2022-02-07 DIAGNOSIS — Z170 Estrogen receptor positive status [ER+]: Secondary | ICD-10-CM

## 2022-02-07 DIAGNOSIS — C50411 Malignant neoplasm of upper-outer quadrant of right female breast: Secondary | ICD-10-CM

## 2022-02-07 MED ORDER — SULFAMETHOXAZOLE-TRIMETHOPRIM 800-160 MG PO TABS: 1.0000 | ORAL_TABLET | Freq: Two times a day (BID) | ORAL | 0 refills | Status: AC

## 2022-02-07 NOTE — Telephone Encounter (Signed)
.  Called patient to schedule appointment per 5/2 inbasket, patient is aware of date and time.   ?

## 2022-02-07 NOTE — Progress Notes (Signed)
? ?Subjective:  ? ?  Patient ID: Jenna Cross, female    DOB: 29-Jan-1986, 36 y.o.   MRN: [id number] ? ?Chief Complaint  ?Patient presents with  ? Post-op Follow-up  ? ? ?HPI: The patient is a 36 y.o. female here for follow-up on her bilateral breast reconstruction.  She had prepectoral reconstruction with subsequent placement of breast implants on 01/01/2022.  Postoperatively patient developed right breast cellulitis as well as an abscess.  She underwent placement of a drain by radiology, cultures were sensitive to Bactrim.  Patient subsequently underwent removal of right breast implant with Dr. Marla Roe on 01/25/2022.  Patient was discharged on 01/26/2022. ? ?Jenna Cross presents today, she reports overall she is doing well.  She reports JP drain output is very minimal.  It has been 5 cc over the past 48 hours.  She is not having any infectious symptoms.  She is still taking Bactrim, reports no side effects from this.  She is scheduled for repeat radiation mapping in 2 weeks with plans to begin radiation shortly after. ? ?She has some questions about neck steps for reconstruction including options for reexpansion of the skin and flaps ? ?Review of Systems  ?Constitutional:  Negative for chills and fever.  ?Skin:  Positive for color change and rash. Negative for wound.  ? ? ?Objective:  ? ?Vital Signs ?LMP  (LMP Unknown)  ?Vital Signs and Nursing Note Reviewed ?Chaperone present ?Physical Exam ?Constitutional:   ?   General: She is not in acute distress. ?   Appearance: Normal appearance. She is not ill-appearing or toxic-appearing.  ?HENT:  ?   Head: Normocephalic and atraumatic.  ?Chest:  ? ? ?   Comments: Right breast incision is intact, healing well, skin has contracted after implant removal.  No erythema or cellulitic changes noted.  Some swelling still present.  JP drain with 5 cc of serous drainage noted. ? ?Left breast incision is intact, she does have some desiccated skin noted, maculopapular rash noted  throughout the superior pole of the breast.  I do not see any cellulitic changes or drainage from the incision.  There is no underlying fluid collection with palpation.  No tenderness to the left breast with palpation. ?Skin: ?   General: Skin is warm.  ?   Findings: Rash (Rash of left breast noted) present.  ?Neurological:  ?   Mental Status: She is alert.  ?Psychiatric:     ?   Mood and Affect: Mood normal.     ?   Behavior: Behavior normal.  ? ? ? ? ?Assessment/Plan:  ? ?  ICD-10-CM   ?1. Malignant neoplasm of upper-outer quadrant of right breast in female, estrogen receptor positive ([place])  C50.411   ? Z17.0   ?  ? ? ?36 year old female status post bilateral breast reconstruction with subsequent removal of the right breast implant for infection.  She is scheduled for radiation mapping in the next 2 weeks.  Today on evaluation the right breast is healing well, JP drain was removed, patient tolerated this well.  Recommend continued compression. ? ?The left breast incision is intact and healing well, however she does have a rash and some desiccated skin of the peri-incisional area.  This does not look concerning for infection, however given her history and drain removal today will extend antibiotics for additional week. ? ?We discussed reconstructive options moving forward after completion of radiation.  ?We discussed latissimus myocutaneous muscle flap.  We also discussed the possibility of free flaps  as an option, we did discuss that this would be referral to outside hospital.  All of her questions were answered to her content. ? ?Pictures were taken and placed in patient's chart with patient's permission.  Recommend following up in 1 week to reevaluate.  Recommend close monitoring of the left breast for worsening signs. ? ? ?Carola Rhine Harrie Cazarez, PA-C ?02/07/2022, 12:03 PM ? ? ? ? ? ?

## 2022-02-08 ENCOUNTER — Ambulatory Visit: Payer: 59

## 2022-02-09 ENCOUNTER — Telehealth: Payer: 59 | Admitting: Physician Assistant

## 2022-02-09 ENCOUNTER — Ambulatory Visit: Payer: 59

## 2022-02-09 ENCOUNTER — Encounter: Payer: Self-pay | Admitting: Plastic Surgery

## 2022-02-09 DIAGNOSIS — R21 Rash and other nonspecific skin eruption: Secondary | ICD-10-CM | POA: Diagnosis not present

## 2022-02-09 MED ORDER — PREDNISONE 10 MG (21) PO TBPK: ORAL_TABLET | ORAL | 0 refills | Status: DC

## 2022-02-09 NOTE — Patient Instructions (Signed)
?Jenna Cross, thank you for joining Mar Daring, PA-C for today's virtual visit.  While this provider is not your primary care provider (PCP), if your PCP is located in our provider database this encounter information will be shared with them immediately following your visit. ? ?Consent: ?(Patient) Jenna Cross provided verbal consent for this virtual visit at the beginning of the encounter. ? ?Current Medications: ? ?Current Outpatient Medications:  ?  predniSONE (STERAPRED UNI-PAK 21 TAB) 10 MG (21) TBPK tablet, 6 day taper; take as directed on package instructions, Disp: 21 tablet, Rfl: 0 ?  acetaminophen (TYLENOL) 500 MG tablet, Take 1,000 mg by mouth every 6 (six) hours as needed for mild pain or headache., Disp: , Rfl:  ?  ALPRAZolam (XANAX) 0.5 MG tablet, Take 0.5 mg by mouth 3 (three) times daily as needed for anxiety., Disp: , Rfl:  ?  diphenoxylate-atropine (LOMOTIL) 2.5-0.025 MG tablet, Take 1 tablet by mouth 3 (three) times daily as needed for diarrhea or loose stools., Disp: 30 tablet, Rfl: 3 ?  escitalopram (LEXAPRO) 20 MG tablet, Take 20 mg by mouth every evening., Disp: , Rfl:  ?  loratadine (CLARITIN) 10 MG tablet, Take 10 mg by mouth daily as needed for allergies., Disp: , Rfl:  ?  magic mouthwash w/lidocaine SOLN, Take 5 mLs by mouth 3 (three) times daily as needed for mouth pain., Disp: 100 mL, Rfl: 0 ?  rivaroxaban (XARELTO) 20 MG TABS tablet, Take 1 tablet (20 mg total) by mouth daily with supper. (Patient taking differently: Take 20 mg by mouth daily.), Disp: 30 tablet, Rfl: 5 ?  sulfamethoxazole-trimethoprim (BACTRIM DS) 800-160 MG tablet, Take 1 tablet by mouth 2 (two) times daily for 5 days., Disp: 10 tablet, Rfl: 0 ?  valACYclovir (VALTREX) 500 MG tablet, Take 500 mg by mouth every evening., Disp: , Rfl:   ? ?Medications ordered in this encounter:  ?Meds ordered this encounter  ?Medications  ? predniSONE (STERAPRED UNI-PAK 21 TAB) 10 MG (21) TBPK tablet  ?  Sig: 6 day  taper; take as directed on package instructions  ?  Dispense:  21 tablet  ?  Refill:  0  ?  Order Specific Question:   Supervising Provider  ?  Answer:   Noemi Chapel [[id number]]  ?  ? ?*If you need refills on other medications prior to your next appointment, please contact your pharmacy* ? ?Follow-Up: ?Call back or seek an in-person evaluation if the symptoms worsen or if the condition fails to improve as anticipated. ? ?Other Instructions ?Contact Dermatitis ?Dermatitis is redness, soreness, and swelling (inflammation) of the skin. Contact dermatitis is a reaction to certain substances that touch the skin. Many different substances can cause contact dermatitis. There are two types of contact dermatitis: ?Irritant contact dermatitis. This type is caused by something that irritates your skin, such as having dry hands from washing them too often with soap. This type does not require previous exposure to the substance for a reaction to occur. This is the most common type. ?Allergic contact dermatitis. This type is caused by a substance that you are allergic to, such as poison ivy. This type occurs when you have been exposed to the substance (allergen) and develop a sensitivity to it. Dermatitis may develop soon after your first exposure to the allergen, or it may not develop until the next time you are exposed and every time thereafter. ?What are the causes? ?Irritant contact dermatitis is most commonly caused by exposure to: ?Makeup. ?Soaps. ?  Detergents. ?Bleaches. ?Acids. ?Metal salts, such as nickel. ?Allergic contact dermatitis is most commonly caused by exposure to: ?Poisonous plants. ?Chemicals. ?Jewelry. ?Latex. ?Medicines. ?Preservatives in products, such as clothing. ?What increases the risk? ?You are more likely to develop this condition if you have: ?A job that exposes you to irritants or allergens. ?Certain medical conditions, such as asthma or eczema. ?What are the signs or symptoms? ?Symptoms of this  condition may occur on your body anywhere the irritant has touched you or is touched by you. ?Symptoms include: ?Dryness or flaking. ?Redness. ?Cracks. ?Itching. ?Pain or a burning feeling. ?Blisters. ?Drainage of small amounts of blood or clear fluid from skin cracks. ?With allergic contact dermatitis, there may also be swelling in areas such as the eyelids, mouth, or genitals. ?How is this diagnosed? ?This condition is diagnosed with a medical history and physical exam. ?A patch skin test may be performed to help determine the cause. ?If the condition is related to your job, you may need to see an occupational medicine specialist. ?How is this treated? ?This condition is treated by checking for the cause of the reaction and protecting your skin from further contact. Treatment may also include: ?Steroid creams or ointments. Oral steroid medicines may be needed in more severe cases. ?Antibiotic medicines or antibacterial ointments, if a skin infection is present. ?Antihistamine lotion or an antihistamine taken by mouth to ease itching. ?A bandage (dressing). ?Follow these instructions at home: ?Skin care ?Moisturize your skin as needed. ?Apply cool compresses to the affected areas. ?Try applying baking soda paste to your skin. Stir water into baking soda until it reaches a paste-like consistency. ?Do not scratch your skin, and avoid friction to the affected area. ?Avoid the use of soaps, perfumes, and dyes. ?Medicines ?Take or apply over-the-counter and prescription medicines only as told by your health care provider. ?If you were prescribed an antibiotic medicine, take or apply the antibiotic as told by your health care provider. Do not stop using the antibiotic even if your condition improves. ?Bathing ?Try taking a bath with: ?Epsom salts. Follow the instructions on the packaging. You can get these at your local pharmacy or grocery store. ?Baking soda. Pour a small amount into the bath as directed by your health  care provider. ?Colloidal oatmeal. Follow the instructions on the packaging. You can get this at your local pharmacy or grocery store. ?Bathe less frequently, such as every other day. ?Bathe in lukewarm water. Avoid using hot water. ?Bandage care ?If you were given a bandage (dressing), change it as told by your health care provider. ?Wash your hands with soap and water before and after you change your dressing. If soap and water are not available, use hand sanitizer. ?General instructions ?Avoid the substance that caused your reaction. If you do not know what caused it, keep a journal to try to track what caused it. Write down: ?What you eat. ?What cosmetic products you use. ?What you drink. ?What you wear in the affected area. This includes jewelry. ?Check the affected areas every day for signs of infection. Check for: ?More redness, swelling, or pain. ?More fluid or blood. ?Warmth. ?Pus or a bad smell. ?Keep all follow-up visits as told by your health care provider. This is important. ?Contact a health care provider if: ?Your condition does not improve with treatment. ?Your condition gets worse. ?You have signs of infection such as swelling, tenderness, redness, soreness, or warmth in the affected area. ?You have a fever. ?You have new symptoms. ?Get  help right away if: ?You have a severe headache, neck pain, or neck stiffness. ?You vomit. ?You feel very sleepy. ?You notice red streaks coming from the affected area. ?Your bone or joint underneath the affected area becomes painful after the skin has healed. ?The affected area turns darker. ?You have difficulty breathing. ?Summary ?Dermatitis is redness, soreness, and swelling (inflammation) of the skin. Contact dermatitis is a reaction to certain substances that touch the skin. ?Symptoms of this condition may occur on your body anywhere the irritant has touched you or is touched by you. ?This condition is treated by figuring out what caused the reaction and  protecting your skin from further contact. Treatment may also include medicines and skin care. ?Avoid the substance that caused your reaction. If you do not know what caused it, keep a journal to try to track wha

## 2022-02-09 NOTE — Progress Notes (Signed)
?Virtual Visit Consent  ? ?Jenna Cross, you are scheduled for a virtual visit with a [facility] provider today. Just as with appointments in the office, your consent must be obtained to participate. Your consent will be active for this visit and any virtual visit you may have with one of our providers in the next 365 days. If you have a MyChart account, a copy of this consent can be sent to you electronically. ? ?As this is a virtual visit, video technology does not allow for your provider to perform a traditional examination. This may limit your provider's ability to fully assess your condition. If your provider identifies any concerns that need to be evaluated in person or the need to arrange testing (such as labs, EKG, etc.), we will make arrangements to do so. Although advances in technology are sophisticated, we cannot ensure that it will always work on either your end or our end. If the connection with a video visit is poor, the visit may have to be switched to a telephone visit. With either a video or telephone visit, we are not always able to ensure that we have a secure connection. ? ?By engaging in this virtual visit, you consent to the provision of healthcare and authorize for your insurance to be billed (if applicable) for the services provided during this visit. Depending on your insurance coverage, you may receive a charge related to this service. ? ?I need to obtain your verbal consent now. Are you willing to proceed with your visit today? GERALYNN CAPRI has provided verbal consent on 02/09/2022 for a virtual visit (video or telephone). Mar Daring, PA-C ? ?Date: 02/09/2022 2:36 PM ? ?Virtual Visit via Video Note  ? ?Jenna Cross, connected with  Jenna Cross  ([id number], Jul 27, 1986) on 02/09/22 at  2:30 PM EDT by a video-enabled telemedicine application and verified that I am speaking with the correct person using two identifiers. ? ?Location: ?Patient: Virtual Visit  Location Patient: Home ?Provider: Virtual Visit Location Provider: Home Office ?  ?I discussed the limitations of evaluation and management by telemedicine and the availability of in person appointments. The patient expressed understanding and agreed to proceed.   ? ?History of Present Illness: ?Jenna Cross is a 36 y.o. who identifies as a female who was assigned female at birth, and is being seen today for rash. She underwent double mastectomy for breast cancer of the right breast on 12/06/21. She then had reconstruction on 01/01/22. Postoperatively she developed cellulitis and an abscess of the right breast implant. She underwent removal on 01/25/22. She has been on Bactrim since. She was seen on 02/07/22 in the plastic surgery office for drain removal from the right breast. At the time it was also noted the rash on the left breast (picture available in media). She presents today with rash worsening on the left breast, now covering the entirety of the left breast. She also has mild itching on her forearms that started yesterday. She reports having decreased sensation of the breast tissue, but does think it is mildly itchy. She has been wearing her binder. Denies fever, pain, fever, chills, nausea, vomiting, fatigue. Tried topical steroid cream just last night, no relief yet.  ? ? ?Problems:  ?Patient Active Problem List  ? Diagnosis Date Noted  ? Port-A-Cath in place 01/26/2022  ? Infection of right breast 01/22/2022  ? History of DVT (deep vein thrombosis) 01/22/2022  ? Breast cancer ([facility]) 12/06/2021  ? Deep venous  thrombosis ([facility]) 09/26/2021  ? Genetic testing 08/01/2021  ? Family history of prostate cancer 07/17/2021  ? Malignant neoplasm of upper-outer quadrant of right breast in female, estrogen receptor positive ([place]) 06/27/2021  ? Breast tenderness in female 05/26/2021  ? Breast mass 05/26/2021  ? History of preterm premature rupture of membranes (PPROM) 01/31/2021  ? Preterm premature rupture of  membranes (PPROM) with unknown onset of labor 01/27/2021  ? Vacuum extractor delivery, delivered 01/04/2020  ? Pregnancy 01/03/2020  ? Encounter for counseling 08/29/2018  ? Paresthesia 04/05/2016  ?  ?Allergies:  ?Allergies  ?Allergen Reactions  ? Wound Dressings Other (See Comments)  ?  Honey comb dressing causes redness and inflammation   ? ?Medications:  ?Current Outpatient Medications:  ?  predniSONE (STERAPRED UNI-PAK 21 TAB) 10 MG (21) TBPK tablet, 6 day taper; take as directed on package instructions, Disp: 21 tablet, Rfl: 0 ?  acetaminophen (TYLENOL) 500 MG tablet, Take 1,000 mg by mouth every 6 (six) hours as needed for mild pain or headache., Disp: , Rfl:  ?  ALPRAZolam (XANAX) 0.5 MG tablet, Take 0.5 mg by mouth 3 (three) times daily as needed for anxiety., Disp: , Rfl:  ?  diphenoxylate-atropine (LOMOTIL) 2.5-0.025 MG tablet, Take 1 tablet by mouth 3 (three) times daily as needed for diarrhea or loose stools., Disp: 30 tablet, Rfl: 3 ?  escitalopram (LEXAPRO) 20 MG tablet, Take 20 mg by mouth every evening., Disp: , Rfl:  ?  loratadine (CLARITIN) 10 MG tablet, Take 10 mg by mouth daily as needed for allergies., Disp: , Rfl:  ?  magic mouthwash w/lidocaine SOLN, Take 5 mLs by mouth 3 (three) times daily as needed for mouth pain., Disp: 100 mL, Rfl: 0 ?  rivaroxaban (XARELTO) 20 MG TABS tablet, Take 1 tablet (20 mg total) by mouth daily with supper. (Patient taking differently: Take 20 mg by mouth daily.), Disp: 30 tablet, Rfl: 5 ?  sulfamethoxazole-trimethoprim (BACTRIM DS) 800-160 MG tablet, Take 1 tablet by mouth 2 (two) times daily for 5 days., Disp: 10 tablet, Rfl: 0 ?  valACYclovir (VALTREX) 500 MG tablet, Take 500 mg by mouth every evening., Disp: , Rfl:  ? ?Observations/Objective: ?Patient is well-developed, well-nourished in no acute distress.  ?Resting comfortably at home.  ?Head is normocephalic, atraumatic.  ?No labored breathing.  ?Speech is clear and coherent with logical content.  ?Patient  is alert and oriented at baseline.  ?Left breast with well healed surgical incision; diffuse maculopapular rash noted on the entire breast tissue; does not radiate to chest wall or abdomen yet ? ?Assessment and Plan: ?1. Rash and nonspecific skin eruption ?- predniSONE (STERAPRED UNI-PAK 21 TAB) 10 MG (21) TBPK tablet; 6 day taper; take as directed on package instructions  Dispense: 21 tablet; Refill: 0 ? ?- Suspect some allergic reaction, unsure if contact based or medication based ?- Will start prednisone taper ?- Continue topical prednisone ?- Cool compresses ?- May add benadryl and famotidine if needed ?- Continue Bactrim for now, stop if rash persists with starting prednisone ?- Keep scheduled follow up on Tuesday ?- Seek in person evaluation if develops shortness of breath, facial or mouth swelling, throat or tongue swelling ? ?Follow Up Instructions: ?I discussed the assessment and treatment plan with the patient. The patient was provided an opportunity to ask questions and all were answered. The patient agreed with the plan and demonstrated an understanding of the instructions.  A copy of instructions were sent to the patient via MyChart unless otherwise  noted below.  ? ? ?The patient was advised to call back or seek an in-person evaluation if the symptoms worsen or if the condition fails to improve as anticipated. ? ?Time:  ?I spent 15 minutes with the patient via telehealth technology discussing the above problems/concerns.   ? ?Mar Daring, PA-C ?

## 2022-02-12 ENCOUNTER — Inpatient Hospital Stay: Payer: 59

## 2022-02-12 ENCOUNTER — Encounter: Payer: Self-pay | Admitting: Hematology and Oncology

## 2022-02-12 ENCOUNTER — Inpatient Hospital Stay: Payer: 59 | Admitting: Hematology and Oncology

## 2022-02-12 ENCOUNTER — Ambulatory Visit: Payer: 59

## 2022-02-12 DIAGNOSIS — C50411 Malignant neoplasm of upper-outer quadrant of right female breast: Secondary | ICD-10-CM

## 2022-02-12 NOTE — Addendum Note (Signed)
Addended byAcey Lav D on: 02/12/2022 10:54 AM ? ? Modules accepted: Orders ? ?

## 2022-02-12 NOTE — Addendum Note (Signed)
Addended byAcey Lav D on: 02/12/2022 11:50 AM ? ? Modules accepted: Orders ? ?

## 2022-02-13 ENCOUNTER — Other Ambulatory Visit: Payer: Self-pay

## 2022-02-13 ENCOUNTER — Ambulatory Visit: Payer: 59

## 2022-02-13 ENCOUNTER — Encounter: Payer: Self-pay | Admitting: Plastic Surgery

## 2022-02-13 ENCOUNTER — Ambulatory Visit (INDEPENDENT_AMBULATORY_CARE_PROVIDER_SITE_OTHER): Payer: 59 | Admitting: Plastic Surgery

## 2022-02-13 DIAGNOSIS — Z170 Estrogen receptor positive status [ER+]: Secondary | ICD-10-CM

## 2022-02-13 DIAGNOSIS — C50411 Malignant neoplasm of upper-outer quadrant of right female breast: Secondary | ICD-10-CM

## 2022-02-13 NOTE — Progress Notes (Signed)
The patient is a 36 year old female here for follow-up on her right breast.  She had the implant removed 2 weeks ago.  The skin looks really good.  There is no sign of any more fluid, seroma or infection.  She feels much better.  It appears that all the skin is going to survive.  I would like to see her back in 3 to 4 months.  She will likely start radiation in the next few weeks. ? ?Pictures were obtained of the patient and placed in the chart with the patient's or guardian's permission. ? ?

## 2022-02-14 ENCOUNTER — Ambulatory Visit: Payer: 59

## 2022-02-15 ENCOUNTER — Ambulatory Visit: Payer: 59

## 2022-02-15 ENCOUNTER — Inpatient Hospital Stay: Payer: 59

## 2022-02-15 ENCOUNTER — Other Ambulatory Visit: Payer: Self-pay

## 2022-02-15 ENCOUNTER — Ambulatory Visit: Payer: 59 | Admitting: Hematology and Oncology

## 2022-02-15 ENCOUNTER — Ambulatory Visit
Admission: RE | Admit: 2022-02-15 | Discharge: 2022-02-15 | Disposition: A | Discharge status: Home / Self Care | Payer: 59 | Source: Ambulatory Visit | Attending: Radiation Oncology | Admitting: Radiation Oncology

## 2022-02-15 ENCOUNTER — Inpatient Hospital Stay (HOSPITAL_BASED_OUTPATIENT_CLINIC_OR_DEPARTMENT_OTHER): Payer: 59 | Admitting: Hematology and Oncology

## 2022-02-15 ENCOUNTER — Encounter: Payer: Self-pay | Admitting: *Deleted

## 2022-02-15 ENCOUNTER — Other Ambulatory Visit: Payer: 59

## 2022-02-15 ENCOUNTER — Inpatient Hospital Stay: Payer: 59 | Admitting: Hematology and Oncology

## 2022-02-15 VITALS — BP 111/60 | HR 92 | Temp 97.6°F | Resp 16

## 2022-02-15 DIAGNOSIS — Z79899 Other long term (current) drug therapy: Secondary | ICD-10-CM

## 2022-02-15 DIAGNOSIS — Z170 Estrogen receptor positive status [ER+]: Secondary | ICD-10-CM

## 2022-02-15 DIAGNOSIS — Z95828 Presence of other vascular implants and grafts: Secondary | ICD-10-CM

## 2022-02-15 DIAGNOSIS — Z006 Encounter for examination for normal comparison and control in clinical research program: Secondary | ICD-10-CM | POA: Diagnosis present

## 2022-02-15 DIAGNOSIS — Z9221 Personal history of antineoplastic chemotherapy: Secondary | ICD-10-CM

## 2022-02-15 DIAGNOSIS — Z9013 Acquired absence of bilateral breasts and nipples: Secondary | ICD-10-CM | POA: Diagnosis not present

## 2022-02-15 DIAGNOSIS — C50411 Malignant neoplasm of upper-outer quadrant of right female breast: Secondary | ICD-10-CM | POA: Diagnosis present

## 2022-02-15 DIAGNOSIS — Z5111 Encounter for antineoplastic chemotherapy: Secondary | ICD-10-CM | POA: Insufficient documentation

## 2022-02-15 DIAGNOSIS — Z510 Encounter for antineoplastic radiation therapy: Secondary | ICD-10-CM | POA: Diagnosis not present

## 2022-02-15 LAB — CBC WITH DIFFERENTIAL (CANCER CENTER ONLY)
Abs Immature Granulocytes: 0.03 10*3/uL (ref 0.00–0.07)
Basophils Absolute: 0 10*3/uL (ref 0.0–0.1)
Basophils Relative: 1 %
Eosinophils Absolute: 0.2 10*3/uL (ref 0.0–0.5)
Eosinophils Relative: 4 %
HCT: 36.4 % (ref 36.0–46.0)
Hemoglobin: 12 g/dL (ref 12.0–15.0)
Immature Granulocytes: 1 %
Lymphocytes Relative: 60 %
Lymphs Abs: 3.4 10*3/uL (ref 0.7–4.0)
MCH: 31.2 pg (ref 26.0–34.0)
MCHC: 33 g/dL (ref 30.0–36.0)
MCV: 94.5 fL (ref 80.0–100.0)
Monocytes Absolute: 0.4 10*3/uL (ref 0.1–1.0)
Monocytes Relative: 7 %
Neutro Abs: 1.6 10*3/uL — ABNORMAL LOW (ref 1.7–7.7)
Neutrophils Relative %: 27 %
Platelet Count: 214 10*3/uL (ref 150–400)
RBC: 3.85 MIL/uL — ABNORMAL LOW (ref 3.87–5.11)
RDW: 14 % (ref 11.5–15.5)
WBC Count: 5.7 10*3/uL (ref 4.0–10.5)
nRBC: 0 % (ref 0.0–0.2)

## 2022-02-15 LAB — CMP (CANCER CENTER ONLY)
ALT: 15 U/L (ref 0–44)
AST: 12 U/L — ABNORMAL LOW (ref 15–41)
Albumin: 3.7 g/dL (ref 3.5–5.0)
Alkaline Phosphatase: 86 U/L (ref 38–126)
Anion gap: 4 — ABNORMAL LOW (ref 5–15)
BUN: 12 mg/dL (ref 6–20)
CO2: 30 mmol/L (ref 22–32)
Calcium: 8.7 mg/dL — ABNORMAL LOW (ref 8.9–10.3)
Chloride: 106 mmol/L (ref 98–111)
Creatinine: 0.72 mg/dL (ref 0.44–1.00)
GFR, Estimated: >60 mL/min (ref >60)
Glucose, Bld: 87 mg/dL (ref 70–99)
Potassium: 3.8 mmol/L (ref 3.5–5.1)
Sodium: 140 mmol/L (ref 135–145)
Total Bilirubin: 0.3 mg/dL (ref 0.3–1.2)
Total Protein: 6.4 g/dL — ABNORMAL LOW (ref 6.5–8.1)

## 2022-02-15 MED ORDER — INV-TUCATINIB/PLACEBO 150 MG TABLET ALLIANCE A011801: 300.0000 mg | ORAL_TABLET | Freq: Two times a day (BID) | ORAL | 0 refills | Status: AC

## 2022-02-15 MED ORDER — HEPARIN SOD (PORK) LOCK FLUSH 100 UNIT/ML IV SOLN
500.0000 [IU] | Freq: Once | INTRAVENOUS | Status: AC | PRN
  Administered 2022-02-15: 500 [IU]

## 2022-02-15 MED ORDER — ONDANSETRON HCL 8 MG PO TABS: 8.0000 mg | ORAL_TABLET | Freq: Three times a day (TID) | ORAL | 3 refills | Status: DC | PRN

## 2022-02-15 MED ORDER — SODIUM CHLORIDE 0.9% FLUSH
10.0000 mL | Freq: Once | INTRAVENOUS | Status: AC
  Administered 2022-02-15: 10 mL

## 2022-02-15 MED ORDER — GOSERELIN ACETATE 3.6 MG ~~LOC~~ IMPL: 3.6000 mg | DRUG_IMPLANT | Freq: Once | SUBCUTANEOUS | Status: DC

## 2022-02-15 MED ORDER — ACETAMINOPHEN 325 MG PO TABS
650.0000 mg | ORAL_TABLET | Freq: Once | ORAL | Status: AC
  Administered 2022-02-15: 650 mg via ORAL
  Filled 2022-02-15: qty 2

## 2022-02-15 MED ORDER — SODIUM CHLORIDE 0.9% FLUSH
10.0000 mL | INTRAVENOUS | Status: DC | PRN
  Administered 2022-02-15: 10 mL

## 2022-02-15 MED ORDER — SODIUM CHLORIDE 0.9 % IV SOLN
3.6000 mg/kg | Freq: Once | INTRAVENOUS | Status: AC
  Administered 2022-02-15: 220 mg via INTRAVENOUS
  Filled 2022-02-15: qty 8

## 2022-02-15 MED ORDER — SODIUM CHLORIDE 0.9 % IV SOLN: Freq: Once | INTRAVENOUS | Status: AC

## 2022-02-15 MED ORDER — DIPHENHYDRAMINE HCL 25 MG PO CAPS
50.0000 mg | ORAL_CAPSULE | Freq: Once | ORAL | Status: AC
  Administered 2022-02-15: 50 mg via ORAL
  Filled 2022-02-15: qty 2

## 2022-02-15 MED ORDER — GOSERELIN ACETATE 3.6 MG ~~LOC~~ IMPL
3.6000 mg | DRUG_IMPLANT | Freq: Once | SUBCUTANEOUS | Status: AC
  Administered 2022-02-15: 3.6 mg via SUBCUTANEOUS
  Filled 2022-02-15: qty 3.6

## 2022-02-15 NOTE — Patient Instructions (Addendum)
[facility]  Discharge Instructions: ?Thank you for choosing [facility] to provide your oncology and hematology care.  ? ?If you have a lab appointment with the [facility], please go directly to the [facility] and check in at the registration area. ?  ?Wear comfortable clothing and clothing appropriate for easy access to any Portacath or PICC line.  ? ?We strive to give you quality time with your provider. You may need to reschedule your appointment if you arrive late (15 or more minutes).  Arriving late affects you and other patients whose appointments are after yours.  Also, if you miss three or more appointments without notifying the office, you may be dismissed from the clinic at the provider?s discretion.    ?  ?For prescription refill requests, have your pharmacy contact our office and allow 72 hours for refills to be completed.   ? ?Today you received the following chemotherapy and/or immunotherapy agents: Kadcyla & Zoladex ?  ?To help prevent nausea and vomiting after your treatment, we encourage you to take your nausea medication as directed. ? ?BELOW ARE SYMPTOMS THAT SHOULD BE REPORTED IMMEDIATELY: ?*FEVER GREATER THAN 100.4 F (38 ?C) OR HIGHER ?*CHILLS OR SWEATING ?*NAUSEA AND VOMITING THAT IS NOT CONTROLLED WITH YOUR NAUSEA MEDICATION ?*UNUSUAL SHORTNESS OF BREATH ?*UNUSUAL BRUISING OR BLEEDING ?*URINARY PROBLEMS (pain or burning when urinating, or frequent urination) ?*BOWEL PROBLEMS (unusual diarrhea, constipation, pain near the anus) ?TENDERNESS IN MOUTH AND THROAT WITH OR WITHOUT PRESENCE OF ULCERS (sore throat, sores in mouth, or a toothache) ?UNUSUAL RASH, SWELLING OR PAIN  ?UNUSUAL VAGINAL DISCHARGE OR ITCHING  ? ?Items with * indicate a potential emergency and should be followed up as soon as possible or go to the Emergency Department if any problems should occur. ? ?Please show the CHEMOTHERAPY ALERT CARD or IMMUNOTHERAPY ALERT CARD at  check-in to the Emergency Department and triage nurse. ? ?Should you have questions after your visit or need to cancel or reschedule your appointment, please contact [facility]  Dept: [phone]  and follow the prompts.  Office hours are 8:00 a.m. to 4:30 p.m. Monday - Friday. Please note that voicemails left after 4:00 p.m. may not be returned until the following business day.  We are closed weekends and major holidays. You have access to a nurse at all times for urgent questions. Please call the main number to the clinic Dept: [phone] and follow the prompts. ? ? ?For any non-urgent questions, you may also contact your provider using MyChart. We now offer e-Visits for anyone 45 and older to request care online for non-urgent symptoms. For details visit [email or web address]. ?  ?Also download the MyChart app! Go to the app store, search "MyChart", open the app, select [facility], and log in with your MyChart username and password. ? ?Due to Covid, a mask is required upon entering the hospital/clinic. If you do not have a mask, one will be given to you upon arrival. For doctor visits, patients may have 1 support person aged 61 or older with them. For treatment visits, patients cannot have anyone with them due to current Covid guidelines and our immunocompromised population.  ? ?

## 2022-02-15 NOTE — Research (Signed)
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 ?Patient presented to clinic unaccompanied this morning for cycle 1 day 1. ? ?RAD-ONC: Patient had repeat CT-simulation status-post right breast implant removal. ? ?LABS: CBC and CMP collected via port-a-cath per consent and study protocol. Patient tolerated well without complaint. ? ?VITAL SIGNS: Completed, including weight. See vital signs flowsheet.  ? ?H&P: Completed by Dr Lindi Adie. See clinic note dated 02/15/22. Provider reviewed lab results and cleared patient to begin treatment.  ? ?RESEARCH: Concomitant medications reviewed. Patient stated that she had a mild rash (grade 1) on her left breast; she saw plastic surgery and they instructed her to use OTC topical steroid cream and ordered an oral steroid taper. Per patient, rash resolved quickly and both topical and oral steroid courses were complete; she believes the irritation was related to her post-op breast binder. Medication list updated and verified with patient.  ? ?Solicited and other adverse events assessed per protocol. See table below. Of note, patient's blood pressure was 116/51 today; looking back, patient has intermittent hypotension (grade 1) at baseline. ? ?Patient was educated on her treatment. She was provided with written instructions for tucatinib/placebo, including how to take it, how to store it, and what to do if she missed a dose. These instructions were reviewed in detail. Patient was given a medication wallet card per protocol. Patient was also given a medication diary and educated on how to use it.  ? ?Tucatinib/placebo dispensed by pharmacy. Research RN verified that drug had correct patient name and study ID number. Drug was dispensed in two bottles of 50 '150mg'$  tablets, lot #[id number] (bottle 1: 11972, bottle 2: [id number]), exp 05/2023. Patient instructed to take two tablets ('300mg'$  total) orally twice daily  approximately twelve hours apart, and to swallow tablets whole, with or without food. Patient instructed to store medication in the refrigerator and to pack it in a cooler when travelling. Patient reminded to bring medication bottles and medication diary to each clinic appointment. Patient verbalized understanding.  ?  ?Once labs resulted, treatment plan was released. Patient took C1D1 morning dose of two tucatinib/placebo tablets ('300mg'$  total) in the presence of this research nurse. Patient successfully recorded dose on the medication diary.  ? ?INFUSION: Patient received clinic standard pre-medications for T-DM1 treatment -- '650mg'$  acetaminophen PO and '50mg'$  diphenhydramine PO. As she had tolerated a dose of T-DM1 prior to enrolling in trial, C1D1 infusion was run over 30 minutes with a 30 minute post-observation period.  ? ?ADVERSE EVENT LOG: ?Study/Protocol: [facility] [phone] CompassHER2 ?Cycle 1  ? ?Baseline: anxiety (grade 2), fatigue (grade 1), herpes simplex virus, hypotension (grade 1), vascular access complication (grade 3). Not reportable (unrelated/unlikely to be related to study treatment and < grade 3): hypocalcemia (grade 1). ? ?Event (solicited vs other reportable) Grade CTCAE v5.0 Onset Date Resolved Date Attribution to Study Treatment Treatment Comments  ?Neutrophil count decreased 1 02/15/22 Ongoing Unrelated N/A ANC 1.6 on C1D1 labs  ?Platelet count decreased 0 N/A N/A N/A N/A    ?Rash (maculo-papular) 1 02/07/22 02/10/22 Unrelated OTC topical steroid cream + oral steroid taper Irritation from post-op binder; assessed by plastic surgery. Resolved + medications completed by C1D1.  ?Epistaxis 0 N/A N/A N/A N/A    ?Diarrhea 0 N/A N/A N/A N/A Patient reported a baseline of 2-3 stools/day  ?Nausea 0 N/A N/A N/A N/A    ?Vomiting 0 N/A N/A N/A N/A    ?Bilirubin increased 0  N/A N/A N/A N/A    ?AST increased 0 N/A N/A N/A N/A    ?ALT increased 0 N/A N/A N/A N/A    ?Anemia 0 N/A N/A N/A N/A    ?Mucositis oral 0  N/A N/A N/A N/A    ?Palmar-plantar erythrodysesthesia 0 N/A N/A N/A N/A    ?Peripheral neuropathy -- motor 0 N/A N/A N/A N/A    ?Peripheral neuropathy -- sensory 0 N/A N/A N/A N/A    ?Breast infection 3 01/18/2022 02/13/2022 Unrelated IV antibiotics + surgical wash-out, followed by oral antibiotics Implant removed 01/25/2022. Patient completed oral antibiotics and cleared by plastic surgery.  ? ? ?PLAN: Research to schedule patient for R4E31 labs between 5/19-5/25/23. Patient requested these labs to be collected via peripheral stick. Next treatment visit (C2D1) is scheduled for 03/08/22 and will include labs, a provider visit, and an infusion appointment. Since C2D1 appointment is in the afternoon, research RN instructed patient to take morning dose of tucatinib/placebo as scheduled per protocol. Patient verbalized understanding. Research will meet with patient and accompany her to appointments on C2D1. Patient reminded to continue to fill out ePROs as they become available. Patient thanked for her time and continued voluntary participation in this study. Patient has been provided direct contact information and is encouraged to contact this nurse for any questions or concerns. ? ?Vickii Penna, RN, BSN, CPN ?Clinical Research Nurse I ?[phone] ? ?02/15/2022 1:15 PM ? ?

## 2022-02-15 NOTE — Progress Notes (Signed)
? ?Patient Care Team: ?Dian Queen, MD as PCP - General (Obstetrics and Gynecology) ?Dian Queen, MD (Obstetrics and Gynecology) ?Mauro Kaufmann, RN as Oncology Nurse Navigator ?Rockwell Germany, RN as Oncology Nurse Navigator ?Nicholas Lose, MD as Consulting Physician (Hematology and Oncology) ?Gery Pray, MD as Consulting Physician (Radiation Oncology) ?Rolm Bookbinder, MD as Consulting Physician (General Surgery) ? ?DIAGNOSIS:  ?Encounter Diagnosis  ?Name Primary?  ? Malignant neoplasm of upper-outer quadrant of right breast in female, estrogen receptor positive ([place])   ? ? ?SUMMARY OF ONCOLOGIC HISTORY: ?Oncology History  ?Malignant neoplasm of upper-outer quadrant of right breast in female, estrogen receptor positive ([place])  ?06/23/2021 Initial Diagnosis  ? Palpable lump in the right breast. Diagnostic mammogram and [place]: a 3.8 cm mass at the retroareolar right breast, 1 prominent right axillary lymph node with a 4 mm cortex, 1 other which is borderline measuring 3 mm. 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  3 o'clock position: Biopsy fibroadenoma ?  ?07/12/2021 -  Neo-Adjuvant Chemotherapy  ? Neoadjuvant chemotherapy with [place] Perjeta ?6 cycles followed by Herceptin Perjeta maintenance versus Kadcyla maintenance (based on response to neoadjuvant chemo) for 1 year ?  ?07/28/2021 Genetic Testing  ? Negative hereditary cancer genetic testing: no pathogenic variants detected in [facility] CancerNext-Expanded +RNAinsight Panel.  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  The report date is July 28, 2021.  ? ?The CancerNext-Expanded gene panel offered by [facility] and includes sequencing, rearrangement, and RNA analysis for the following 77 genes: AIP, ALK, APC, ATM, AXIN2, BAP1, BARD1, BLM, BMPR1A, BRCA1, BRCA2, BRIP1, CDC73, CDH1, CDK4, CDKN1B, CDKN2A, CHEK2, CTNNA1, DICER1, FANCC, FH,  FLCN, GALNT12, KIF1B, LZTR1, MAX, MEN1, MET, MLH1, MSH2, MSH3, MSH6, MUTYH, NBN, NF1, NF2, NTHL1, PALB2, PHOX2B, PMS2, POT1, PRKAR1A, PTCH1, PTEN, RAD51C, RAD51D, RB1, RECQL, RET, SDHA, SDHAF2, SDHB, SDHC, SDHD, SMAD4, SMARCA4, SMARCB1, SMARCE1, STK11, SUFU, TMEM127, TP53, TSC1, TSC2, VHL and XRCC2 (sequencing and deletion/duplication); EGFR, EGLN1, HOXB13, KIT, MITF, PDGFRA, POLD1, and POLE (sequencing only); EPCAM and GREM1 (deletion/duplication only).  ?  ?12/06/2021 Surgery  ? Bilateral Mastectomies: ?Left mastectomy: Benign ?Right mastectomy: Multifocal ILC 1.8 cm, 0.9 cm, margins negative, 1/2 lymph nodes positive, ER 50%, PR 30%, HER2 1+ IHC ?  ?12/26/2021 - 12/29/2021 Chemotherapy  ? Patient is on Treatment Plan : BREAST ADO-Trastuzumab Emtansine (Kadcyla) q21d  ? ?   ? ? ?CHIEF COMPLIANT: Follow-up of right breast cancer on Kadcyla. ? ?INTERVAL HISTORY: Jenna Cross is a 36 y.o. with above-mentioned history of right breast cancer, currently on chemotherapy with TCHP. She presents to the clinic today for follow-up. She states that she tolerated the Kadcyla cycle 1 fairly well.  Since then her treatment was delayed because of surgical complication cellulitis for which she was in the hospital eventually led to removal of the implants.  She has done remarkably better since that surgery and 2 days ago she saw her plastic surgeon who felt that the infection is completely gone and that she can proceed with systemic therapy once again.  She has appointments with radiation oncology. ? ? ?ALLERGIES:  is allergic to wound dressings. ? ?MEDICATIONS:  ?Current Outpatient Medications  ?Medication Sig Dispense Refill  ? acetaminophen (TYLENOL) 500 MG tablet Take 1,000 mg by mouth every 6 (six) hours as needed for mild pain or headache.    ? ALPRAZolam (XANAX) 0.5 MG tablet Take 0.5 mg by mouth 3 (three) times daily as needed for anxiety.    ?  diphenoxylate-atropine (LOMOTIL) 2.5-0.025 MG tablet Take 1 tablet by mouth 3  (three) times daily as needed for diarrhea or loose stools. 30 tablet 3  ? escitalopram (LEXAPRO) 20 MG tablet Take 20 mg by mouth every evening.    ? loratadine (CLARITIN) 10 MG tablet Take 10 mg by mouth daily as needed for allergies.    ? magic mouthwash w/lidocaine SOLN Take 5 mLs by mouth 3 (three) times daily as needed for mouth pain. 100 mL 0  ? predniSONE (STERAPRED UNI-PAK 21 TAB) 10 MG (21) TBPK tablet 6 day taper; take as directed on package instructions 21 tablet 0  ? rivaroxaban (XARELTO) 20 MG TABS tablet Take 1 tablet (20 mg total) by mouth daily with supper. (Patient taking differently: Take 20 mg by mouth daily.) 30 tablet 5  ? valACYclovir (VALTREX) 500 MG tablet Take 500 mg by mouth every evening.    ? ?No current facility-administered medications for this visit.  ? ? ?PHYSICAL EXAMINATION: ?ECOG PERFORMANCE STATUS: 1 - Symptomatic but completely ambulatory ? ?Vitals:  ? 02/15/22 0951  ?BP: (!) 116/51  ?Pulse: 82  ?Resp: 18  ?Temp: (!) 97.5 ?F (36.4 ?C)  ?SpO2: 100%  ? ?Filed Weights  ? 02/15/22 0951  ?Weight: 132 lb 3.2 oz (60 kg)  ? ?  ? ?LABORATORY DATA:  ?I have reviewed the data as listed ? ?  Latest Ref Rng & Units 02/15/2022  ?  9:02 AM 01/23/2022  ? 11:20 AM 01/23/2022  ?  1:14 AM  ?CMP  ?Glucose 70 - 99 mg/dL 87   112   99    ?BUN 6 - 20 mg/dL 12   7   8     ?Creatinine 0.44 - 1.00 mg/dL 0.72   0.74   0.71    ?Sodium 135 - 145 mmol/L 140   134   135    ?Potassium 3.5 - 5.1 mmol/L 3.8   3.7   3.7    ?Chloride 98 - 111 mmol/L 106   102   102    ?CO2 22 - 32 mmol/L 30   23   22     ?Calcium 8.9 - 10.3 mg/dL 8.7   8.0   8.0    ?Total Protein 6.5 - 8.1 g/dL 6.4      ?Total Bilirubin 0.3 - 1.2 mg/dL 0.3      ?Alkaline Phos 38 - 126 U/L 86      ?AST 15 - 41 U/L 12      ?ALT 0 - 44 U/L 15      ? ? ?Lab Results  ?Component Value Date  ? WBC 5.7 02/15/2022  ? HGB 12.0 02/15/2022  ? HCT 36.4 02/15/2022  ? MCV 94.5 02/15/2022  ? PLT 214 02/15/2022  ? NEUTROABS 1.6 (L) 02/15/2022  ? ? ?ASSESSMENT & PLAN:   ?Malignant neoplasm of upper-outer quadrant of right breast in female, estrogen receptor positive ([place]) ?Palpable lump in the right breast. Diagnostic mammogram and [place]: a 3.8 cm mass at the retroareolar right breast, 1 prominent right axillary lymph node with a 4 mm cortex, 1 other which is borderline measuring 3 mm. 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  3 o'clock position: Biopsy fibroadenoma ?  ?Treatment plan: ?1. Neoadjuvant chemotherapy with [place] Perjeta ?6 cycles followed by Herceptin Perjeta maintenance versus Kadcyla maintenance (based on response to neoadjuvant chemo) for 1 year ?2. Bilateral Mastectomies: ?Left mastectomy: Benign ?Right mastectomy: Multifocal ILC 1.8 cm, 0.9  cm, margins negative, 1/2 lymph nodes positive, ER 50%, PR 30%, HER2 1+ IHC ?3.  Compass HER2 clinical trial: Randomization between Kadcyla versus Kadcyla plus Tucatinib ?4. Followed by adjuvant radiation therapy  ?5.  Followed by antiestrogen therapy ?  ?------------------------------------------------------------------------------------------------------------------------ ?Current treatment: Kadcyla cycle 2 maintenance (Compass HER2 clinical trial) ?Echocardiogram March 2023: EF 55 to 60% ? ?Hospitalization 01/22/2022-01/26/2022 abscess/cellulitis required removal of the implant ?The plan is now a delayed reconstruction ? ?Patient to start radiation very soon ?Return to clinic every 3 weeks for Kadcyla ? ? ?No orders of the defined types were placed in this encounter. ? ?The patient has a good understanding of the overall plan. she agrees with it. she will call with any problems that may develop before the next visit here. ?Total time spent: 30 mins including face to face time and time spent for planning, charting and co-ordination of care ? ? Suzzette Righter, CMA ?02/15/22 ? ? ? I Deritra, [place] am scribing for Dr. Lindi Adie ? ?I have reviewed the above  documentation for accuracy and completeness, and I agree with the above. ?  ?

## 2022-02-15 NOTE — Assessment & Plan Note (Signed)
Palpable lump in the right breast. Diagnostic mammogram and [place]: a 3.8 cm mass at the retroareolar right breast, 1 prominent right axillary lymph node with a 4 mm cortex, 1 other which is borderline measuring 3 mm. 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 ?3 o'clock position: Biopsy fibroadenoma ?? ?Treatment plan: ?1. Neoadjuvant chemotherapy with [place] Perjeta ?6 cycles followed by Herceptin Perjeta maintenance versus Kadcyla maintenance (based on response to neoadjuvant chemo) for 1 year ?2.?Bilateral Mastectomies: ?Left mastectomy: Benign ?Right mastectomy: Multifocal ILC 1.8 cm, 0.9 cm, margins negative, 1/2 lymph nodes positive, ER 50%, PR 30%, HER2 1+ IHC ?3.  Compass HER2 clinical trial: Randomization between Kadcyla versus Kadcyla plus Tucatinib ?4. Followed by adjuvant radiation therapy? ?5.??Followed by antiestrogen therapy ?? ?------------------------------------------------------------------------------------------------------------------------ ?Current treatment: Kadcyla cycle 2 maintenance (Compass HER2 clinical trial) ?Echocardiogram March 2023: EF 55 to 60% ? ?Hospitalization 01/22/2022-01/26/2022 abscess/cellulitis required removal of the implant ?The plan is now a delayed reconstruction ? ?Patient to start radiation very soon ?Return to clinic every 3 weeks for Kadcyla ?

## 2022-02-16 ENCOUNTER — Ambulatory Visit: Payer: 59

## 2022-02-19 ENCOUNTER — Ambulatory Visit: Payer: 59

## 2022-02-19 DIAGNOSIS — Z170 Estrogen receptor positive status [ER+]: Secondary | ICD-10-CM

## 2022-02-19 NOTE — Research (Signed)
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 ?Patient contacted research over the weekend regarding potential treatment side effects. Connected with patient via phone this morning to follow-up. ? ?ADVERSE EVENTS: Patient reported ?migraine-like? symptoms that started on Friday, 5/12, after her morning dose of tucatinib/placebo. She noted throbbing pain in her right temple that radiated to her jaw. Over the course of the weekend, the pain ?migrated? from her head, to her right shoulder, and finally to her chest when taking a deep breath. Patient reported that she was able to continue her activities of daily living throughout, but that she ?got spooked? and stopped tucatinib/placebo after Friday morning dose. The pain gradually improved over the weekend and is ?barely there? today. ? ?The patient also mentioned that she had just completed approximately one month of antibiotic therapy, an oral steroid taper, and had botox injections in her forehead last Monday, 5/8.  ? 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 See table below.  ? ?Per protocol, dose reductions are not required for grade 1 or 2 adverse events that are not related to hepatic dysfunction, left ventricular dysfunction, thrombocytopenia, nodular regenerative hyperplasia, or pulmonary fibrosis. ? ?ADVERSE EVENT LOG: ?Study/Protocol: [facility] [id number] CompassHER2 ?Cycle 1 ? ?Event Grade  ?CTCAE v5.0 Onset Date Resolved Date Attribution to T-DM1 Attribution to Tucatinib/placebo Treatment Comments  ?Headache 2 02/16/22 Ongoing Possible Possible N/A Resume treatment at same dose; track symptoms.  ? ?PLAN: Relayed above information to patient; she verbalized understanding. Patient encouraged to continue documenting potential side effects and reporting them to  research, but maintain medication compliance per protocol. Patient thanked for her time and continued voluntary participation in this study. Patient has been provided direct contact information and is encouraged to contact this nurse for any questions or concerns. ? ?Vickii Penna, RN, BSN, CPN ?Clinical Research Nurse I ?[phone] ? ?02/19/2022 4:45 PM ? ?

## 2022-02-20 ENCOUNTER — Ambulatory Visit: Payer: 59

## 2022-02-20 ENCOUNTER — Ambulatory Visit: Payer: 59 | Admitting: Radiation Oncology

## 2022-02-21 ENCOUNTER — Ambulatory Visit: Payer: 59

## 2022-02-21 DIAGNOSIS — C50411 Malignant neoplasm of upper-outer quadrant of right female breast: Secondary | ICD-10-CM | POA: Diagnosis not present

## 2022-02-21 DIAGNOSIS — Z5111 Encounter for antineoplastic chemotherapy: Secondary | ICD-10-CM | POA: Diagnosis not present

## 2022-02-22 ENCOUNTER — Ambulatory Visit
Admission: RE | Admit: 2022-02-22 | Discharge: 2022-02-22 | Disposition: A | Discharge status: Home / Self Care | Payer: 59 | Source: Ambulatory Visit | Attending: Radiation Oncology | Admitting: Radiation Oncology

## 2022-02-22 ENCOUNTER — Other Ambulatory Visit: Payer: Self-pay

## 2022-02-22 ENCOUNTER — Ambulatory Visit: Payer: 59

## 2022-02-22 ENCOUNTER — Inpatient Hospital Stay: Payer: 59 | Admitting: Hematology and Oncology

## 2022-02-22 ENCOUNTER — Inpatient Hospital Stay: Payer: 59

## 2022-02-22 DIAGNOSIS — Z5111 Encounter for antineoplastic chemotherapy: Secondary | ICD-10-CM | POA: Diagnosis not present

## 2022-02-22 DIAGNOSIS — C50411 Malignant neoplasm of upper-outer quadrant of right female breast: Secondary | ICD-10-CM | POA: Diagnosis not present

## 2022-02-22 DIAGNOSIS — Z170 Estrogen receptor positive status [ER+]: Secondary | ICD-10-CM

## 2022-02-22 LAB — RAD ONC ARIA SESSION SUMMARY

## 2022-02-23 ENCOUNTER — Other Ambulatory Visit: Payer: Self-pay

## 2022-02-23 ENCOUNTER — Ambulatory Visit: Payer: 59

## 2022-02-23 ENCOUNTER — Encounter: Payer: Self-pay | Admitting: Hematology and Oncology

## 2022-02-23 ENCOUNTER — Ambulatory Visit
Admission: RE | Admit: 2022-02-23 | Discharge: 2022-02-23 | Disposition: A | Discharge status: Home / Self Care | Payer: 59 | Source: Ambulatory Visit | Attending: Radiation Oncology | Admitting: Radiation Oncology

## 2022-02-23 DIAGNOSIS — Z5111 Encounter for antineoplastic chemotherapy: Secondary | ICD-10-CM | POA: Diagnosis not present

## 2022-02-23 DIAGNOSIS — C50411 Malignant neoplasm of upper-outer quadrant of right female breast: Secondary | ICD-10-CM | POA: Diagnosis not present

## 2022-02-23 LAB — RAD ONC ARIA SESSION SUMMARY
Course Elapsed Days: 1
Plan Fractions Treated to Date: 1
Plan Fractions Treated to Date: 2
Plan Prescribed Dose Per Fraction: 1.8 Gy
Plan Prescribed Dose Per Fraction: 1.8 Gy
Plan Total Fractions Prescribed: 14
Plan Total Fractions Prescribed: 28
Plan Total Prescribed Dose: 25.2 Gy
Plan Total Prescribed Dose: 50.4 Gy
Reference Point Dosage Given to Date: 3.6 Gy
Reference Point Dosage Given to Date: 3.6 Gy
Reference Point Session Dosage Given: 1.8 Gy
Reference Point Session Dosage Given: 1.8 Gy
Session Number: 2

## 2022-02-26 ENCOUNTER — Ambulatory Visit
Admission: RE | Admit: 2022-02-26 | Discharge: 2022-02-26 | Disposition: A | Discharge status: Home / Self Care | Payer: 59 | Source: Ambulatory Visit | Attending: Radiation Oncology | Admitting: Radiation Oncology

## 2022-02-26 ENCOUNTER — Other Ambulatory Visit: Payer: Self-pay

## 2022-02-26 ENCOUNTER — Encounter: Payer: Self-pay | Admitting: Radiation Oncology

## 2022-02-26 ENCOUNTER — Ambulatory Visit: Payer: 59

## 2022-02-26 ENCOUNTER — Encounter: Payer: Self-pay | Admitting: Hematology and Oncology

## 2022-02-26 DIAGNOSIS — Z5111 Encounter for antineoplastic chemotherapy: Secondary | ICD-10-CM | POA: Diagnosis not present

## 2022-02-26 DIAGNOSIS — C50411 Malignant neoplasm of upper-outer quadrant of right female breast: Secondary | ICD-10-CM | POA: Diagnosis not present

## 2022-02-26 LAB — RAD ONC ARIA SESSION SUMMARY
Course Elapsed Days: 4
Plan Fractions Treated to Date: 2
Plan Fractions Treated to Date: 3
Plan Prescribed Dose Per Fraction: 1.8 Gy
Plan Prescribed Dose Per Fraction: 1.8 Gy
Plan Total Fractions Prescribed: 14
Plan Total Fractions Prescribed: 28
Plan Total Prescribed Dose: 25.2 Gy
Plan Total Prescribed Dose: 50.4 Gy
Reference Point Dosage Given to Date: 5.4 Gy
Reference Point Dosage Given to Date: 5.4 Gy
Reference Point Session Dosage Given: 1.8 Gy
Reference Point Session Dosage Given: 1.8 Gy
Session Number: 3

## 2022-02-27 ENCOUNTER — Inpatient Hospital Stay: Payer: 59

## 2022-02-27 ENCOUNTER — Ambulatory Visit: Payer: 59

## 2022-02-27 ENCOUNTER — Telehealth: Payer: Self-pay | Admitting: *Deleted

## 2022-02-27 ENCOUNTER — Inpatient Hospital Stay: Payer: 59 | Attending: Hematology and Oncology

## 2022-02-27 ENCOUNTER — Ambulatory Visit: Payer: 59 | Admitting: Radiation Oncology

## 2022-02-27 ENCOUNTER — Other Ambulatory Visit: Payer: Self-pay

## 2022-02-27 ENCOUNTER — Ambulatory Visit
Admission: RE | Admit: 2022-02-27 | Discharge: 2022-02-27 | Disposition: A | Discharge status: Home / Self Care | Payer: 59 | Source: Ambulatory Visit | Attending: Radiation Oncology | Admitting: Radiation Oncology

## 2022-02-27 DIAGNOSIS — Z170 Estrogen receptor positive status [ER+]: Secondary | ICD-10-CM | POA: Diagnosis not present

## 2022-02-27 DIAGNOSIS — C50411 Malignant neoplasm of upper-outer quadrant of right female breast: Secondary | ICD-10-CM | POA: Diagnosis present

## 2022-02-27 DIAGNOSIS — Z006 Encounter for examination for normal comparison and control in clinical research program: Secondary | ICD-10-CM | POA: Diagnosis present

## 2022-02-27 DIAGNOSIS — Z5111 Encounter for antineoplastic chemotherapy: Secondary | ICD-10-CM | POA: Diagnosis not present

## 2022-02-27 LAB — CMP (CANCER CENTER ONLY)
ALT: 35 U/L (ref 0–44)
AST: 37 U/L (ref 15–41)
Albumin: 3.8 g/dL (ref 3.5–5.0)
Alkaline Phosphatase: 89 U/L (ref 38–126)
Anion gap: 6 (ref 5–15)
BUN: 10 mg/dL (ref 6–20)
CO2: 28 mmol/L (ref 22–32)
Calcium: 8.8 mg/dL — ABNORMAL LOW (ref 8.9–10.3)
Chloride: 107 mmol/L (ref 98–111)
Creatinine: 0.97 mg/dL (ref 0.44–1.00)
GFR, Estimated: >60 mL/min (ref >60)
Glucose, Bld: 104 mg/dL — ABNORMAL HIGH (ref 70–99)
Potassium: 3.5 mmol/L (ref 3.5–5.1)
Sodium: 141 mmol/L (ref 135–145)
Total Bilirubin: 0.5 mg/dL (ref 0.3–1.2)
Total Protein: 6.9 g/dL (ref 6.5–8.1)

## 2022-02-27 LAB — RAD ONC ARIA SESSION SUMMARY
Course Elapsed Days: 5
Plan Fractions Treated to Date: 2
Plan Fractions Treated to Date: 4
Plan Prescribed Dose Per Fraction: 1.8 Gy
Plan Prescribed Dose Per Fraction: 1.8 Gy
Plan Total Fractions Prescribed: 14
Plan Total Fractions Prescribed: 28
Plan Total Prescribed Dose: 25.2 Gy
Plan Total Prescribed Dose: 50.4 Gy
Reference Point Dosage Given to Date: 7.2 Gy
Reference Point Dosage Given to Date: 7.2 Gy
Reference Point Session Dosage Given: 1.8 Gy
Reference Point Session Dosage Given: 1.8 Gy
Session Number: 4

## 2022-02-27 NOTE — Telephone Encounter (Signed)
Received on (02/21/22) via of fax DME Standard Written Order Jenna Cross) from [facility] [place].  Requesting signature and return.  Given to the provider to sign.  DME Standard Written Order signed and faxed back to [facility] [place].  Confirmation received and copy scanned into the chart.//AB/CMA

## 2022-02-28 ENCOUNTER — Ambulatory Visit: Payer: 59

## 2022-02-28 ENCOUNTER — Other Ambulatory Visit: Payer: Self-pay

## 2022-02-28 ENCOUNTER — Ambulatory Visit
Admission: RE | Admit: 2022-02-28 | Discharge: 2022-02-28 | Disposition: A | Discharge status: Home / Self Care | Payer: 59 | Source: Ambulatory Visit | Attending: Radiation Oncology | Admitting: Radiation Oncology

## 2022-02-28 DIAGNOSIS — Z5111 Encounter for antineoplastic chemotherapy: Secondary | ICD-10-CM | POA: Diagnosis not present

## 2022-02-28 DIAGNOSIS — C50411 Malignant neoplasm of upper-outer quadrant of right female breast: Secondary | ICD-10-CM

## 2022-02-28 LAB — RAD ONC ARIA SESSION SUMMARY
Course Elapsed Days: 6
Plan Fractions Treated to Date: 3
Plan Fractions Treated to Date: 5
Plan Prescribed Dose Per Fraction: 1.8 Gy
Plan Prescribed Dose Per Fraction: 1.8 Gy
Plan Total Fractions Prescribed: 14
Plan Total Fractions Prescribed: 28
Plan Total Prescribed Dose: 25.2 Gy
Plan Total Prescribed Dose: 50.4 Gy
Reference Point Dosage Given to Date: 9 Gy
Reference Point Dosage Given to Date: 9 Gy
Reference Point Session Dosage Given: 1.8 Gy
Reference Point Session Dosage Given: 1.8 Gy
Session Number: 5

## 2022-02-28 MED ORDER — RADIAPLEXRX EX GEL: Freq: Once | CUTANEOUS | Status: AC

## 2022-02-28 MED ORDER — ALRA NON-METALLIC DEODORANT (RAD-ONC)
1.0000 "application " | Freq: Once | TOPICAL | Status: AC
  Administered 2022-02-28: 1 via TOPICAL

## 2022-02-28 NOTE — Progress Notes (Signed)
Pt here for patient teaching.    Pt given Radiation and You booklet, skin care instructions, Alra deodorant, and Radiaplex gel.    Reviewed areas of pertinence such as fatigue, hair loss in treatment field, skin changes, breast tenderness, and breast swelling .   Pt able to give teach back of to pat skin and use unscented/gentle soap,apply Radiaplex bid, avoid applying anything to skin within 4 hours of treatment, avoid wearing an under wire bra, and to use an electric razor if they must shave.   Pt verbalizes understanding of information given and will contact nursing with any questions or concerns.          

## 2022-03-01 ENCOUNTER — Ambulatory Visit: Payer: 59

## 2022-03-01 ENCOUNTER — Ambulatory Visit
Admission: RE | Admit: 2022-03-01 | Discharge: 2022-03-01 | Disposition: A | Discharge status: Home / Self Care | Payer: 59 | Source: Ambulatory Visit | Attending: Radiation Oncology | Admitting: Radiation Oncology

## 2022-03-01 ENCOUNTER — Other Ambulatory Visit: Payer: Self-pay

## 2022-03-01 DIAGNOSIS — Z5111 Encounter for antineoplastic chemotherapy: Secondary | ICD-10-CM | POA: Diagnosis not present

## 2022-03-01 DIAGNOSIS — C50411 Malignant neoplasm of upper-outer quadrant of right female breast: Secondary | ICD-10-CM | POA: Diagnosis not present

## 2022-03-01 LAB — RAD ONC ARIA SESSION SUMMARY
Course Elapsed Days: 7
Plan Fractions Treated to Date: 3
Plan Fractions Treated to Date: 6
Plan Prescribed Dose Per Fraction: 1.8 Gy
Plan Prescribed Dose Per Fraction: 1.8 Gy
Plan Total Fractions Prescribed: 14
Plan Total Fractions Prescribed: 28
Plan Total Prescribed Dose: 25.2 Gy
Plan Total Prescribed Dose: 50.4 Gy
Reference Point Dosage Given to Date: 10.8 Gy
Reference Point Dosage Given to Date: 10.8 Gy
Reference Point Session Dosage Given: 1.8 Gy
Reference Point Session Dosage Given: 1.8 Gy
Session Number: 6

## 2022-03-02 ENCOUNTER — Ambulatory Visit: Payer: 59

## 2022-03-02 ENCOUNTER — Other Ambulatory Visit: Payer: Self-pay

## 2022-03-02 ENCOUNTER — Ambulatory Visit
Admission: RE | Admit: 2022-03-02 | Discharge: 2022-03-02 | Disposition: A | Discharge status: Home / Self Care | Payer: 59 | Source: Ambulatory Visit | Attending: Radiation Oncology | Admitting: Radiation Oncology

## 2022-03-02 DIAGNOSIS — C50411 Malignant neoplasm of upper-outer quadrant of right female breast: Secondary | ICD-10-CM | POA: Diagnosis not present

## 2022-03-02 DIAGNOSIS — Z5111 Encounter for antineoplastic chemotherapy: Secondary | ICD-10-CM | POA: Diagnosis not present

## 2022-03-02 LAB — RAD ONC ARIA SESSION SUMMARY
Course Elapsed Days: 8
Plan Fractions Treated to Date: 4
Plan Fractions Treated to Date: 7
Plan Prescribed Dose Per Fraction: 1.8 Gy
Plan Prescribed Dose Per Fraction: 1.8 Gy
Plan Total Fractions Prescribed: 14
Plan Total Fractions Prescribed: 28
Plan Total Prescribed Dose: 25.2 Gy
Plan Total Prescribed Dose: 50.4 Gy
Reference Point Dosage Given to Date: 12.6 Gy
Reference Point Dosage Given to Date: 12.6 Gy
Reference Point Session Dosage Given: 1.8 Gy
Reference Point Session Dosage Given: 1.8 Gy
Session Number: 7

## 2022-03-06 ENCOUNTER — Ambulatory Visit: Payer: 59

## 2022-03-06 ENCOUNTER — Inpatient Hospital Stay: Payer: 59

## 2022-03-06 ENCOUNTER — Other Ambulatory Visit: Payer: Self-pay

## 2022-03-06 ENCOUNTER — Inpatient Hospital Stay: Payer: 59 | Admitting: Hematology and Oncology

## 2022-03-06 ENCOUNTER — Ambulatory Visit
Admission: RE | Admit: 2022-03-06 | Discharge: 2022-03-06 | Disposition: A | Discharge status: Home / Self Care | Payer: 59 | Source: Ambulatory Visit | Attending: Radiation Oncology | Admitting: Radiation Oncology

## 2022-03-06 ENCOUNTER — Ambulatory Visit: Payer: 59 | Admitting: Radiation Oncology

## 2022-03-06 DIAGNOSIS — C50411 Malignant neoplasm of upper-outer quadrant of right female breast: Secondary | ICD-10-CM | POA: Diagnosis not present

## 2022-03-06 DIAGNOSIS — Z5111 Encounter for antineoplastic chemotherapy: Secondary | ICD-10-CM | POA: Diagnosis not present

## 2022-03-06 LAB — RAD ONC ARIA SESSION SUMMARY
Course Elapsed Days: 12
Plan Fractions Treated to Date: 4
Plan Fractions Treated to Date: 8
Plan Prescribed Dose Per Fraction: 1.8 Gy
Plan Prescribed Dose Per Fraction: 1.8 Gy
Plan Total Fractions Prescribed: 14
Plan Total Fractions Prescribed: 28
Plan Total Prescribed Dose: 25.2 Gy
Plan Total Prescribed Dose: 50.4 Gy
Reference Point Dosage Given to Date: 14.4 Gy
Reference Point Dosage Given to Date: 14.4 Gy
Reference Point Session Dosage Given: 1.8 Gy
Reference Point Session Dosage Given: 1.8 Gy
Session Number: 8

## 2022-03-06 NOTE — Research (Signed)
Reviewed, nothing changed

## 2022-03-06 NOTE — Research (Signed)
Reviewed, nothing to add

## 2022-03-07 ENCOUNTER — Other Ambulatory Visit: Payer: Self-pay

## 2022-03-07 ENCOUNTER — Ambulatory Visit: Payer: 59

## 2022-03-07 ENCOUNTER — Ambulatory Visit
Admission: RE | Admit: 2022-03-07 | Discharge: 2022-03-07 | Disposition: A | Discharge status: Home / Self Care | Payer: 59 | Source: Ambulatory Visit | Attending: Radiation Oncology | Admitting: Radiation Oncology

## 2022-03-07 DIAGNOSIS — C50411 Malignant neoplasm of upper-outer quadrant of right female breast: Secondary | ICD-10-CM | POA: Diagnosis not present

## 2022-03-07 DIAGNOSIS — Z5111 Encounter for antineoplastic chemotherapy: Secondary | ICD-10-CM | POA: Diagnosis not present

## 2022-03-07 LAB — RAD ONC ARIA SESSION SUMMARY
Course Elapsed Days: 13
Plan Fractions Treated to Date: 5
Plan Fractions Treated to Date: 9
Plan Prescribed Dose Per Fraction: 1.8 Gy
Plan Prescribed Dose Per Fraction: 1.8 Gy
Plan Total Fractions Prescribed: 14
Plan Total Fractions Prescribed: 28
Plan Total Prescribed Dose: 25.2 Gy
Plan Total Prescribed Dose: 50.4 Gy
Reference Point Dosage Given to Date: 16.2 Gy
Reference Point Dosage Given to Date: 16.2 Gy
Reference Point Session Dosage Given: 1.8 Gy
Reference Point Session Dosage Given: 1.8 Gy
Session Number: 9

## 2022-03-08 ENCOUNTER — Other Ambulatory Visit: Payer: Self-pay

## 2022-03-08 ENCOUNTER — Ambulatory Visit
Admission: RE | Admit: 2022-03-08 | Discharge: 2022-03-08 | Disposition: A | Discharge status: Home / Self Care | Payer: 59 | Source: Ambulatory Visit | Attending: Radiation Oncology | Admitting: Radiation Oncology

## 2022-03-08 ENCOUNTER — Ambulatory Visit: Payer: 59

## 2022-03-08 ENCOUNTER — Ambulatory Visit: Payer: 59 | Admitting: Adult Health

## 2022-03-08 ENCOUNTER — Inpatient Hospital Stay: Payer: 59

## 2022-03-08 ENCOUNTER — Telehealth: Payer: Self-pay

## 2022-03-08 ENCOUNTER — Inpatient Hospital Stay (HOSPITAL_BASED_OUTPATIENT_CLINIC_OR_DEPARTMENT_OTHER): Payer: 59 | Admitting: Hematology and Oncology

## 2022-03-08 ENCOUNTER — Encounter: Payer: Self-pay | Admitting: Hematology and Oncology

## 2022-03-08 VITALS — BP 115/53 | HR 83 | Temp 97.7°F | Wt 131.6 lb

## 2022-03-08 DIAGNOSIS — Z006 Encounter for examination for normal comparison and control in clinical research program: Secondary | ICD-10-CM

## 2022-03-08 DIAGNOSIS — Z9013 Acquired absence of bilateral breasts and nipples: Secondary | ICD-10-CM | POA: Diagnosis not present

## 2022-03-08 DIAGNOSIS — Z79899 Other long term (current) drug therapy: Secondary | ICD-10-CM | POA: Insufficient documentation

## 2022-03-08 DIAGNOSIS — Z170 Estrogen receptor positive status [ER+]: Secondary | ICD-10-CM | POA: Insufficient documentation

## 2022-03-08 DIAGNOSIS — Z95828 Presence of other vascular implants and grafts: Secondary | ICD-10-CM

## 2022-03-08 DIAGNOSIS — D696 Thrombocytopenia, unspecified: Secondary | ICD-10-CM | POA: Insufficient documentation

## 2022-03-08 DIAGNOSIS — R590 Localized enlarged lymph nodes: Secondary | ICD-10-CM | POA: Insufficient documentation

## 2022-03-08 DIAGNOSIS — Z5111 Encounter for antineoplastic chemotherapy: Secondary | ICD-10-CM | POA: Insufficient documentation

## 2022-03-08 DIAGNOSIS — C50411 Malignant neoplasm of upper-outer quadrant of right female breast: Secondary | ICD-10-CM | POA: Diagnosis not present

## 2022-03-08 DIAGNOSIS — Z510 Encounter for antineoplastic radiation therapy: Secondary | ICD-10-CM | POA: Insufficient documentation

## 2022-03-08 LAB — CBC WITH DIFFERENTIAL (CANCER CENTER ONLY)
Abs Immature Granulocytes: 0.03 10*3/uL (ref 0.00–0.07)
Basophils Absolute: 0.1 10*3/uL (ref 0.0–0.1)
Basophils Relative: 1 %
Eosinophils Absolute: 0.2 10*3/uL (ref 0.0–0.5)
Eosinophils Relative: 3 %
HCT: 37 % (ref 36.0–46.0)
Hemoglobin: 12.5 g/dL (ref 12.0–15.0)
Immature Granulocytes: 1 %
Lymphocytes Relative: 30 %
Lymphs Abs: 2 10*3/uL (ref 0.7–4.0)
MCH: 30.9 pg (ref 26.0–34.0)
MCHC: 33.8 g/dL (ref 30.0–36.0)
MCV: 91.6 fL (ref 80.0–100.0)
Monocytes Absolute: 0.5 10*3/uL (ref 0.1–1.0)
Monocytes Relative: 7 %
Neutro Abs: 3.9 10*3/uL (ref 1.7–7.7)
Neutrophils Relative %: 58 %
Platelet Count: 272 10*3/uL (ref 150–400)
RBC: 4.04 MIL/uL (ref 3.87–5.11)
RDW: 14 % (ref 11.5–15.5)
WBC Count: 6.7 10*3/uL (ref 4.0–10.5)
nRBC: 0 % (ref 0.0–0.2)

## 2022-03-08 LAB — CMP (CANCER CENTER ONLY)
ALT: 17 U/L (ref 0–44)
AST: 18 U/L (ref 15–41)
Albumin: 4.1 g/dL (ref 3.5–5.0)
Alkaline Phosphatase: 89 U/L (ref 38–126)
Anion gap: 7 (ref 5–15)
BUN: 12 mg/dL (ref 6–20)
CO2: 27 mmol/L (ref 22–32)
Calcium: 9.5 mg/dL (ref 8.9–10.3)
Chloride: 106 mmol/L (ref 98–111)
Creatinine: 0.87 mg/dL (ref 0.44–1.00)
GFR, Estimated: >60 mL/min (ref >60)
Glucose, Bld: 87 mg/dL (ref 70–99)
Potassium: 3.6 mmol/L (ref 3.5–5.1)
Sodium: 140 mmol/L (ref 135–145)
Total Bilirubin: 0.6 mg/dL (ref 0.3–1.2)
Total Protein: 6.8 g/dL (ref 6.5–8.1)

## 2022-03-08 LAB — RAD ONC ARIA SESSION SUMMARY
Course Elapsed Days: 14
Plan Fractions Treated to Date: 10
Plan Fractions Treated to Date: 5
Plan Prescribed Dose Per Fraction: 1.8 Gy
Plan Prescribed Dose Per Fraction: 1.8 Gy
Plan Total Fractions Prescribed: 14
Plan Total Fractions Prescribed: 28
Plan Total Prescribed Dose: 25.2 Gy
Plan Total Prescribed Dose: 50.4 Gy
Reference Point Dosage Given to Date: 18 Gy
Reference Point Dosage Given to Date: 18 Gy
Reference Point Session Dosage Given: 1.8 Gy
Reference Point Session Dosage Given: 1.8 Gy
Session Number: 10

## 2022-03-08 MED ORDER — SODIUM CHLORIDE 0.9% FLUSH
10.0000 mL | INTRAVENOUS | Status: DC | PRN
  Administered 2022-03-08: 10 mL

## 2022-03-08 MED ORDER — SODIUM CHLORIDE 0.9% FLUSH
10.0000 mL | Freq: Once | INTRAVENOUS | Status: AC
  Administered 2022-03-08: 10 mL

## 2022-03-08 MED ORDER — SODIUM CHLORIDE 0.9 % IV SOLN
3.6000 mg/kg | Freq: Once | INTRAVENOUS | Status: AC
  Administered 2022-03-08: 220 mg via INTRAVENOUS
  Filled 2022-03-08: qty 8

## 2022-03-08 MED ORDER — SODIUM CHLORIDE 0.9 % IV SOLN: Freq: Once | INTRAVENOUS | Status: AC

## 2022-03-08 MED ORDER — ACETAMINOPHEN 325 MG PO TABS
650.0000 mg | ORAL_TABLET | Freq: Once | ORAL | Status: AC
  Administered 2022-03-08: 650 mg via ORAL
  Filled 2022-03-08: qty 2

## 2022-03-08 MED ORDER — HEPARIN SOD (PORK) LOCK FLUSH 100 UNIT/ML IV SOLN
500.0000 [IU] | Freq: Once | INTRAVENOUS | Status: AC | PRN
  Administered 2022-03-08: 500 [IU]

## 2022-03-08 MED ORDER — INV-TUCATINIB/PLACEBO 150 MG TABLET ALLIANCE A011801: 300.0000 mg | ORAL_TABLET | Freq: Two times a day (BID) | ORAL | 0 refills | Status: AC

## 2022-03-08 MED ORDER — DIPHENHYDRAMINE HCL 25 MG PO CAPS
50.0000 mg | ORAL_CAPSULE | Freq: Once | ORAL | Status: AC
  Administered 2022-03-08: 50 mg via ORAL
  Filled 2022-03-08: qty 2

## 2022-03-08 NOTE — Progress Notes (Signed)
Patient Care Team: Dian Queen, MD as PCP - General (Obstetrics and Gynecology) Dian Queen, MD (Obstetrics and Gynecology) Mauro Kaufmann, RN as Oncology Nurse Navigator Rockwell Germany, RN as Oncology Nurse Navigator Nicholas Lose, MD as Consulting Physician (Hematology and Oncology) Gery Pray, MD as Consulting Physician (Radiation Oncology) Rolm Bookbinder, MD as Consulting Physician (General Surgery)  DIAGNOSIS:  No diagnosis found.   SUMMARY OF ONCOLOGIC HISTORY: Oncology History  Malignant neoplasm of upper-outer quadrant of right breast in female, estrogen receptor positive ([place])  06/23/2021 Initial Diagnosis   Palpable lump in the right breast. Diagnostic mammogram and [place]: a 3.8 cm mass at the retroareolar right breast, 1 prominent right axillary lymph node with a 4 mm cortex, 1 other which is borderline measuring 3 mm. 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  3 o'clock position: Biopsy fibroadenoma   07/12/2021 -  Neo-Adjuvant Chemotherapy   Neoadjuvant chemotherapy with TCH Perjeta 6 cycles followed by Herceptin Perjeta maintenance versus Kadcyla maintenance (based on response to neoadjuvant chemo) for 1 year   07/28/2021 Genetic Testing   Negative hereditary cancer genetic testing: no pathogenic variants detected in [facility] CancerNext-Expanded +RNAinsight Panel.  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  The report date is July 28, 2021.   The CancerNext-Expanded gene panel offered by [facility] and includes sequencing, rearrangement, and RNA analysis for the following 77 genes: AIP, ALK, APC, ATM, AXIN2, BAP1, BARD1, BLM, BMPR1A, BRCA1, BRCA2, BRIP1, CDC73, CDH1, CDK4, CDKN1B, CDKN2A, CHEK2, CTNNA1, DICER1, FANCC, FH, FLCN, GALNT12, KIF1B, LZTR1, MAX, MEN1, MET, MLH1, MSH2, MSH3, MSH6, MUTYH, NBN, NF1, NF2, NTHL1, PALB2, PHOX2B, PMS2, POT1,  PRKAR1A, PTCH1, PTEN, RAD51C, RAD51D, RB1, RECQL, RET, SDHA, SDHAF2, SDHB, SDHC, SDHD, SMAD4, SMARCA4, SMARCB1, SMARCE1, STK11, SUFU, TMEM127, TP53, TSC1, TSC2, VHL and XRCC2 (sequencing and deletion/duplication); EGFR, EGLN1, HOXB13, KIT, MITF, PDGFRA, POLD1, and POLE (sequencing only); EPCAM and GREM1 (deletion/duplication only).    12/06/2021 Surgery   Bilateral Mastectomies: Left mastectomy: Benign Right mastectomy: Multifocal ILC 1.8 cm, 0.9 cm, margins negative, 1/2 lymph nodes positive, ER 50%, PR 30%, HER2 1+ IHC   12/26/2021 - 12/29/2021 Chemotherapy   Patient is on Treatment Plan : BREAST ADO-Trastuzumab Emtansine (Kadcyla) q21d        CHIEF COMPLIANT: Follow-up of right breast cancer on Kadcyla.  INTERVAL HISTORY: Jenna Cross is a 36 y.o. with above-mentioned history of right breast cancer, currently on chemotherapy with Kadcyla, part of HER2 Compass study. She is tolerating Kadcyla very well.  She had a weekend where she had some diarrhea.  She also felt very tired, the fatigue was severe enough that she had to pace herself during the day.  She continues on Zoladex for fertility preservation since she is hoping for another child.  She otherwise denies any change in breathing.  No change in bowel habits or urinary habits. Rest of the pertinent 10 point ROS reviewed and negative  ALLERGIES:  is allergic to wound dressings.  MEDICATIONS:  Current Outpatient Medications  Medication Sig Dispense Refill   acetaminophen (TYLENOL) 500 MG tablet Take 1,000 mg by mouth every 6 (six) hours as needed for mild pain or headache.     ALPRAZolam (XANAX) 0.5 MG tablet Take 0.5 mg by mouth 3 (three) times daily as needed for anxiety.     diphenoxylate-atropine (LOMOTIL) 2.5-0.025 MG tablet Take 1 tablet by mouth 3 (three) times daily as needed for diarrhea or loose stools. [place]  tablet 3   escitalopram (LEXAPRO) 20 MG tablet Take 20 mg by mouth every evening.     Investigational  tucatanib/placebo 150 MG tablet (ONT-380,NSC #[id number]) [facility] [id number] Take 2 tablets (300 mg total) 2 (two) times dailyfor 21 days.  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 Tucatinib or placebo should take orally twice daily with or without food, approximately 12 hours apart between doses. Tablets must be swallowed whole and may not be crushed, chewed or dissolved in liquid. CAUTION: Chemotherapy/Biotherapy 100 tablet 0   Investigational tucatanib/placebo 150 MG tablet (ONT-380,NSC #[id number]) [facility] [id number] Take 2 tablets (300 mg total) 2 (two) times dailyfor 21 days.  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 Tucatinib or placebo should take orally twice daily with or without food, approximately 12 hours apart between doses. Tablets must be swallowed whole and may not be crushed, chewed or dissolved in liquid. CAUTION: Chemotherapy/Biotherapy 84 tablet 0   loratadine (CLARITIN) 10 MG tablet Take 10 mg by mouth daily as needed for allergies.     ondansetron (ZOFRAN) 8 MG tablet Take 1 tablet (8 mg total) by mouth every 8 (eight) hours as needed for nausea. 30 tablet 3   predniSONE (STERAPRED UNI-PAK 21 TAB) 10 MG (21) TBPK tablet 6 day taper; take as directed on package instructions (Patient not taking: Reported on 02/15/2022) 21 tablet 0   rivaroxaban (XARELTO) 20 MG TABS tablet Take 1 tablet (20 mg total) by mouth daily with supper. (Patient taking differently: Take 20 mg by mouth daily.) 30 tablet 5   valACYclovir (VALTREX) 500 MG tablet Take 500 mg by mouth every evening.     No current facility-administered medications for this visit.   Facility-Administered Medications Ordered in Other Visits  Medication Dose Route Frequency Provider Last Rate Last Admin   heparin lock flush 100 unit/mL  500 Units Intracatheter Once PRN Nicholas Lose, MD       sodium chloride flush (NS) 0.9 % injection 10 mL  10 mL Intracatheter PRN Nicholas Lose, MD        PHYSICAL EXAMINATION: ECOG PERFORMANCE STATUS: 1 - Symptomatic but completely  ambulatory  Vitals:   03/08/22 1418  BP: (!) 115/53  Pulse: 83  Temp: 97.7 F (36.5 C)  SpO2: 98%   Filed Weights   03/08/22 1418  Weight: 131 lb 9.6 oz (59.7 kg)    Physical Exam Constitutional:      Appearance: Normal appearance.  Cardiovascular:     Rate and Rhythm: Normal rate and regular rhythm.     Pulses: Normal pulses.     Heart sounds: Normal heart sounds.  Musculoskeletal:     Cervical back: Normal range of motion and neck supple. No rigidity.  Lymphadenopathy:     Cervical: No cervical adenopathy.  Skin:    General: Skin is warm and dry.  Neurological:     General: No focal deficit present.     Mental Status: She is alert.  Psychiatric:        Mood and Affect: Mood normal.  Port site looks clean.  LABORATORY DATA:  I have reviewed the data as listed    Latest Ref Rng & Units 03/08/2022    2:07 PM 02/27/2022    8:28 AM 02/15/2022    9:02 AM  CMP  Glucose 70 - 99 mg/dL 87   104   87    BUN 6 - 20 mg/dL 12   10   12     Creatinine 0.44 - 1.00 mg/dL 0.87   0.97   0.72  Sodium 135 - 145 mmol/L 140   141   140    Potassium 3.5 - 5.1 mmol/L 3.6   3.5   3.8    Chloride 98 - 111 mmol/L 106   107   106    CO2 22 - 32 mmol/L 27   28   30     Calcium 8.9 - 10.3 mg/dL 9.5   8.8   8.7    Total Protein 6.5 - 8.1 g/dL 6.8   6.9   6.4    Total Bilirubin 0.3 - 1.2 mg/dL 0.6   0.5   0.3    Alkaline Phos 38 - 126 U/L 89   89   86    AST 15 - 41 U/L 18   37   12    ALT 0 - 44 U/L 17   35   15      Lab Results  Component Value Date   WBC 6.7 03/08/2022   HGB 12.5 03/08/2022   HCT 37.0 03/08/2022   MCV 91.6 03/08/2022   PLT 272 03/08/2022   NEUTROABS 3.9 03/08/2022    ASSESSMENT & PLAN:  Malignant neoplasm of upper-outer quadrant of right breast in female, estrogen receptor positive ([facility]) Palpable lump in the right breast. Diagnostic mammogram and [place]: a 3.8 cm mass at the retroareolar right breast, 1 prominent right axillary lymph node with a 4 mm cortex, 1 other  which is borderline measuring 3 mm. 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  3 o'clock position: Biopsy fibroadenoma   Treatment plan:  1. Neoadjuvant chemotherapy with TCH Perjeta 6 cycles followed by Herceptin Perjeta maintenance versus Kadcyla maintenance (based on response to neoadjuvant chemo) for 1 year 2. Bilateral Mastectomies: Left mastectomy: Benign Right mastectomy: Multifocal ILC 1.8 cm, 0.9 cm, margins negative, 1/2 lymph nodes positive, ER 50%, PR 30%, HER2 1+ IHC 3.  [facility]ass HER2 clinical trial: Randomization between Kadcyla versus Kadcyla plus Tucatinib 4. Followed by adjuvant radiation therapy  5.  Followed by antiestrogen therapy   ------------------------------------------------------------------------------------------------------------------------ Current treatment: Kadcyla cycle 2 maintenance ([facility]ass HER2 clinical trial) Echocardiogram March 2023: EF 55 to 60%  Hospitalization 01/22/2022-01/26/2022 abscess/cellulitis required removal of the implant The plan is now a delayed reconstruction  She is here before cycle 2 of Kadcyla.  She complains of grade 2 fatigue and grade 1 diarrhea which lasted around a week and.  She otherwise denies any neuropathy.  She has been tolerating the treatment very well.  Physical examination today no concerns. Okay to proceed with treatment as planned and return to clinic per research protocol.  No orders of the defined types were placed in this encounter.  The patient has a good understanding of the overall plan. she agrees with it. she will call with any problems that may develop before the next visit here.  Total time spent: 30 mins including face to face time and time spent for planning, charting and co-ordination of care   Benay Pike, MD 03/08/22

## 2022-03-08 NOTE — Telephone Encounter (Signed)
CompassHER2 Residual Disease, A Double-Blinded, Phase III Randomized Trial of T-DM1 and Placebo Compared with T-DMI and Tucatinib  Called and LVM for patient reminding her of cycle 2 appointments this afternoon, as well as importance of bringing her medication diary + unused study drug from cycle 1 to clinic.   Vickii Penna, RN, BSN, CPN Clinical Research Nurse I [phone]  03/08/2022 9:15 AM

## 2022-03-08 NOTE — Patient Instructions (Addendum)
[facility]  Discharge Instructions: °Thank you for choosing [facility] to provide your oncology and hematology care.  ° °If you have a lab appointment with the Cancer Center, please go directly to the Cancer Center and check in at the registration area. °  °Wear comfortable clothing and clothing appropriate for easy access to any Portacath or PICC line.  ° °We strive to give you quality time with your provider. You may need to reschedule your appointment if you arrive late (15 or more minutes).  Arriving late affects you and other patients whose appointments are after yours.  Also, if you miss three or more appointments without notifying the office, you may be dismissed from the clinic at the provider’s discretion.    °  °For prescription refill requests, have your pharmacy contact our office and allow 72 hours for refills to be completed.   ° °Today you received the following chemotherapy and/or immunotherapy agents Kadcyla    °  °To help prevent nausea and vomiting after your treatment, we encourage you to take your nausea medication as directed. ° °BELOW ARE SYMPTOMS THAT SHOULD BE REPORTED IMMEDIATELY: °*FEVER GREATER THAN 100.4 F (38 °C) OR HIGHER °*CHILLS OR SWEATING °*NAUSEA AND VOMITING THAT IS NOT CONTROLLED WITH YOUR NAUSEA MEDICATION °*UNUSUAL SHORTNESS OF BREATH °*UNUSUAL BRUISING OR BLEEDING °*URINARY PROBLEMS (pain or burning when urinating, or frequent urination) °*BOWEL PROBLEMS (unusual diarrhea, constipation, pain near the anus) °TENDERNESS IN MOUTH AND THROAT WITH OR WITHOUT PRESENCE OF ULCERS (sore throat, sores in mouth, or a toothache) °UNUSUAL RASH, SWELLING OR PAIN  °UNUSUAL VAGINAL DISCHARGE OR ITCHING  ° °Items with * indicate a potential emergency and should be followed up as soon as possible or go to the Emergency Department if any problems should occur. ° °Please show the CHEMOTHERAPY ALERT CARD or IMMUNOTHERAPY ALERT CARD at check-in to the  Emergency Department and triage nurse. ° °Should you have questions after your visit or need to cancel or reschedule your appointment, please contact [facility]  Dept: [phone]  and follow the prompts.  Office hours are 8:00 a.m. to 4:30 p.m. Monday - Friday. Please note that voicemails left after 4:00 p.m. may not be returned until the following business day.  We are closed weekends and major holidays. You have access to a nurse at all times for urgent questions. Please call the main number to the clinic Dept: [phone] and follow the prompts. ° ° °For any non-urgent questions, you may also contact your provider using MyChart. We now offer e-Visits for anyone 18 and older to request care online for non-urgent symptoms. For details visit [email or web address]. °  °Also download the MyChart app! Go to the app store, search "MyChart", open the app, select [facility], and log in with your MyChart username and password. ° °Due to Covid, a mask is required upon entering the hospital/clinic. If you do not have a mask, one will be given to you upon arrival. For doctor visits, patients may have 1 support person aged 18 or older with them. For treatment visits, patients cannot have anyone with them due to current Covid guidelines and our immunocompromised population.  ° °

## 2022-03-08 NOTE — Research (Unsigned)
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  RAD-ONC: Patient had regularly scheduled radiation appointment this morning.  LABS: CBC and CMP collected via port-a-cath per consent and study protocol. Patient tolerated well without complaint.  VITAL SIGNS: Completed, including weight. See vital signs flowsheet.   H&P: Completed by Dr Chryl Heck. See clinic note dated 03/08/22. Provider reviewed lab results and cleared patient to continue treatment.   RESEARCH: Concomitant medications reviewed. Medication list updated and verified with patient.   Solicited and other adverse events assessed per protocol. Patient reported that she had several days of mild diarrhea and nausea, with one episode of vomiting. Symptoms resolved spontaneously; patient believes she may have had a "GI bug." As previously noted, patient had moderate headache (grade 2) that Dr Lindi Adie said was possibly related to her treatment with T-DM1 and tucatinib/placebo. Patient reports intermittent headache symptoms are ongoing; she manages these symptoms with tylenol PRN. Patient also reported increased fatigue (grade 2); she has fatigue relieved by rest (grade 1) at baseline, but felt that the level of fatigue had increased and was now interfering with her daily life. See table below.  Patient forgot her cycle 1 tucatinib/placebo and medication diaries; plans to bring them both to her rad-onc appointment tomorrow, 03/09/22. Patient reported that she not been taking tucatinib/placebo as prescribed; will record missed doses and verify remaining tablet count after drug and diaries are returned. Cycle 2 tucatinib/placebo dispensed by pharmacy. Research RN verified that drug had correct patient name and study ID number. Drug was dispensed in two bottles of 50 '150mg'$  tablets, lot #[id number] (bottle 1:  [id number], bottle 2: [id number]), exp 05/2023. Patient instructed to continue taking two tablets ('300mg'$  total) orally twice daily and to continue recording doses and side effects on medication diary. Reinforced importance of compliance with study medication. Patient verbalized understanding and stated she would do better moving forward. Patient was given cycle 2 medication diary.   INFUSION: Patient received clinic standard pre-medications for T-DM1 treatment -- '650mg'$  acetaminophen PO and '50mg'$  diphenhydramine PO. PKs were collected per protocol. As patient had an afternoon appointment, patient took morning dose of tucatinib/placebo as scheduled. Per patient, dose was taken at 9am. Pre-dose PK was collected prior to start of T-DM1 infusion at 1535. C2D1 infusion started at 1539 and ran over 30 minutes with a 30-minute post-observation period. Post-dose PK was collected after completion of T-DM1 infusion at 1615.  ADVERSE EVENT LOG: Study/Protocol: [employer] [id number] CompassHER2 Cycle 2   Baseline: anxiety (grade 2), fatigue (grade 1), herpes simplex virus, hypotension (grade 1), vascular access complication (grade 3). Not reportable (unrelated/unlikely to be related to study treatment and < grade 3): N/A.  Event (solicited vs other reportable) Grade CTCAE v5.0 Onset Date Resolved Date Attribution to Study Treatment Treatment Comments  Neutrophil count decreased 1 02/15/22 03/08/22 Unrelated N/A ANC 1.6 on C1D1 labs; WNL (grade 0) on C2D1 labs  Platelet count decreased 0 N/A N/A N/A N/A    Rash (maculo-papular) 0 N/A N/A N/A N/A   Epistaxis 0 N/A N/A N/A N/A    Diarrhea 1 02/22/22 02/25/22   Patient reported a baseline of 2-3 stools/day. Symptoms resolved spontaneously.  Nausea 1 02/22/22 02/25/22   Symptoms resolved spontaneously.  Vomiting 1 02/22/22 02/22/22   1 episode of vomiting. Symptoms resolved spontaneously.   Bilirubin increased 0 N/A N/A N/A N/A    AST  increased 0 N/A N/A N/A N/A    ALT increased 0 N/A N/A  N/A N/A    Anemia 0 N/A N/A N/A N/A    Mucositis oral 0 N/A N/A N/A N/A    Palmar-plantar erythrodysesthesia 0 N/A N/A N/A N/A    Peripheral neuropathy -- motor 0 N/A N/A N/A N/A    Peripheral neuropathy -- sensory 0 N/A N/A N/A N/A    Headache 2 02/16/22 Ongoing Possible Tylenol PRN Patient reports intermittent symptoms.  Fatigue 2 02/16/22 Ongoing   Grade 1 at baseline; now interfering more w/ daily life   PLAN: Research to meet with patient after radiation appointment tomorrow, 03/09/22, to collect remainder of cycle 1 tucatinib/placebo and cycle 1 medication diary. Research will also schedule patient for C2D12 labs between 6/9-6/15/23. Patient requested these labs to be collected via peripheral stick. Next treatment visit (C3D1) is scheduled for 03/29/22 and will include labs, a provider visit, and an infusion appointment. Since C3D1 appointment is in the morning, research RN instructed patient to hold morning dose of tucatinib/placebo until instructed to take per protocol. Patient verbalized understanding. Research will meet with patient and accompany her to appointments on C3D1. Patient reminded to continue to fill out ePROs as they become available. Patient thanked for her time and continued voluntary participation in this study. Patient has been provided direct contact information and is encouraged to contact this nurse for any questions or concerns.  SIGNXXX

## 2022-03-09 ENCOUNTER — Ambulatory Visit: Payer: 59

## 2022-03-09 ENCOUNTER — Other Ambulatory Visit: Payer: Self-pay

## 2022-03-09 ENCOUNTER — Encounter: Payer: Self-pay | Admitting: Hematology and Oncology

## 2022-03-09 ENCOUNTER — Ambulatory Visit
Admission: RE | Admit: 2022-03-09 | Discharge: 2022-03-09 | Disposition: A | Discharge status: Home / Self Care | Payer: 59 | Source: Ambulatory Visit | Attending: Radiation Oncology | Admitting: Radiation Oncology

## 2022-03-09 DIAGNOSIS — Z5111 Encounter for antineoplastic chemotherapy: Secondary | ICD-10-CM | POA: Diagnosis not present

## 2022-03-09 LAB — RAD ONC ARIA SESSION SUMMARY
Course Elapsed Days: 15
Plan Fractions Treated to Date: 11
Plan Fractions Treated to Date: 6
Plan Prescribed Dose Per Fraction: 1.8 Gy
Plan Prescribed Dose Per Fraction: 1.8 Gy
Plan Total Fractions Prescribed: 14
Plan Total Fractions Prescribed: 28
Plan Total Prescribed Dose: 25.2 Gy
Plan Total Prescribed Dose: 50.4 Gy
Reference Point Dosage Given to Date: 19.8 Gy
Reference Point Dosage Given to Date: 19.8 Gy
Reference Point Session Dosage Given: 1.8 Gy
Reference Point Session Dosage Given: 1.8 Gy
Session Number: 11

## 2022-03-12 ENCOUNTER — Ambulatory Visit
Admission: RE | Admit: 2022-03-12 | Discharge: 2022-03-12 | Disposition: A | Discharge status: Home / Self Care | Payer: 59 | Source: Ambulatory Visit | Attending: Radiation Oncology | Admitting: Radiation Oncology

## 2022-03-12 ENCOUNTER — Ambulatory Visit: Payer: 59

## 2022-03-12 ENCOUNTER — Other Ambulatory Visit: Payer: Self-pay

## 2022-03-12 DIAGNOSIS — Z5111 Encounter for antineoplastic chemotherapy: Secondary | ICD-10-CM | POA: Diagnosis not present

## 2022-03-12 LAB — RAD ONC ARIA SESSION SUMMARY
Course Elapsed Days: 18
Plan Fractions Treated to Date: 12
Plan Fractions Treated to Date: 6
Plan Prescribed Dose Per Fraction: 1.8 Gy
Plan Prescribed Dose Per Fraction: 1.8 Gy
Plan Total Fractions Prescribed: 14
Plan Total Fractions Prescribed: 28
Plan Total Prescribed Dose: 25.2 Gy
Plan Total Prescribed Dose: 50.4 Gy
Reference Point Dosage Given to Date: 21.6 Gy
Reference Point Dosage Given to Date: 21.6 Gy
Reference Point Session Dosage Given: 1.8 Gy
Reference Point Session Dosage Given: 1.8 Gy
Session Number: 12

## 2022-03-13 ENCOUNTER — Ambulatory Visit: Payer: 59

## 2022-03-13 ENCOUNTER — Other Ambulatory Visit: Payer: Self-pay

## 2022-03-13 ENCOUNTER — Ambulatory Visit
Admission: RE | Admit: 2022-03-13 | Discharge: 2022-03-13 | Disposition: A | Discharge status: Home / Self Care | Payer: 59 | Source: Ambulatory Visit | Attending: Radiation Oncology | Admitting: Radiation Oncology

## 2022-03-13 DIAGNOSIS — Z5111 Encounter for antineoplastic chemotherapy: Secondary | ICD-10-CM | POA: Diagnosis not present

## 2022-03-13 LAB — RAD ONC ARIA SESSION SUMMARY
Course Elapsed Days: 19
Plan Fractions Treated to Date: 13
Plan Fractions Treated to Date: 7
Plan Prescribed Dose Per Fraction: 1.8 Gy
Plan Prescribed Dose Per Fraction: 1.8 Gy
Plan Total Fractions Prescribed: 14
Plan Total Fractions Prescribed: 28
Plan Total Prescribed Dose: 25.2 Gy
Plan Total Prescribed Dose: 50.4 Gy
Reference Point Dosage Given to Date: 23.4 Gy
Reference Point Dosage Given to Date: 23.4 Gy
Reference Point Session Dosage Given: 1.8 Gy
Reference Point Session Dosage Given: 1.8 Gy
Session Number: 13

## 2022-03-14 ENCOUNTER — Other Ambulatory Visit: Payer: Self-pay

## 2022-03-14 ENCOUNTER — Ambulatory Visit
Admission: RE | Admit: 2022-03-14 | Discharge: 2022-03-14 | Disposition: A | Discharge status: Home / Self Care | Payer: 59 | Source: Ambulatory Visit | Attending: Radiation Oncology | Admitting: Radiation Oncology

## 2022-03-14 ENCOUNTER — Ambulatory Visit: Payer: 59

## 2022-03-14 DIAGNOSIS — Z5111 Encounter for antineoplastic chemotherapy: Secondary | ICD-10-CM | POA: Diagnosis not present

## 2022-03-14 LAB — RAD ONC ARIA SESSION SUMMARY
Course Elapsed Days: 20
Plan Fractions Treated to Date: 14
Plan Fractions Treated to Date: 7
Plan Prescribed Dose Per Fraction: 1.8 Gy
Plan Prescribed Dose Per Fraction: 1.8 Gy
Plan Total Fractions Prescribed: 14
Plan Total Fractions Prescribed: 28
Plan Total Prescribed Dose: 25.2 Gy
Plan Total Prescribed Dose: 50.4 Gy
Reference Point Dosage Given to Date: 25.2 Gy
Reference Point Dosage Given to Date: 25.2 Gy
Reference Point Session Dosage Given: 1.8 Gy
Reference Point Session Dosage Given: 1.8 Gy
Session Number: 14

## 2022-03-15 ENCOUNTER — Other Ambulatory Visit: Payer: Self-pay

## 2022-03-15 ENCOUNTER — Ambulatory Visit: Payer: 59

## 2022-03-15 ENCOUNTER — Ambulatory Visit
Admission: RE | Admit: 2022-03-15 | Discharge: 2022-03-15 | Disposition: A | Discharge status: Home / Self Care | Payer: 59 | Source: Ambulatory Visit | Attending: Radiation Oncology | Admitting: Radiation Oncology

## 2022-03-15 ENCOUNTER — Inpatient Hospital Stay: Payer: 59

## 2022-03-15 ENCOUNTER — Other Ambulatory Visit: Payer: Self-pay | Admitting: *Deleted

## 2022-03-15 ENCOUNTER — Ambulatory Visit: Payer: 59 | Admitting: Adult Health

## 2022-03-15 DIAGNOSIS — Z5111 Encounter for antineoplastic chemotherapy: Secondary | ICD-10-CM | POA: Diagnosis not present

## 2022-03-15 DIAGNOSIS — Z5181 Encounter for therapeutic drug level monitoring: Secondary | ICD-10-CM

## 2022-03-15 LAB — RAD ONC ARIA SESSION SUMMARY
Course Elapsed Days: 21
Plan Fractions Treated to Date: 15
Plan Fractions Treated to Date: 8
Plan Prescribed Dose Per Fraction: 1.8 Gy
Plan Prescribed Dose Per Fraction: 1.8 Gy
Plan Total Fractions Prescribed: 14
Plan Total Fractions Prescribed: 28
Plan Total Prescribed Dose: 25.2 Gy
Plan Total Prescribed Dose: 50.4 Gy
Reference Point Dosage Given to Date: 27 Gy
Reference Point Dosage Given to Date: 27 Gy
Reference Point Session Dosage Given: 1.8 Gy
Reference Point Session Dosage Given: 1.8 Gy
Session Number: 15

## 2022-03-15 NOTE — Research (Signed)
Addendum to print admin record

## 2022-03-16 ENCOUNTER — Ambulatory Visit: Payer: 59

## 2022-03-16 ENCOUNTER — Other Ambulatory Visit: Payer: Self-pay

## 2022-03-16 ENCOUNTER — Inpatient Hospital Stay: Payer: 59

## 2022-03-16 ENCOUNTER — Telehealth: Payer: Self-pay

## 2022-03-16 ENCOUNTER — Ambulatory Visit
Admission: RE | Admit: 2022-03-16 | Discharge: 2022-03-16 | Disposition: A | Discharge status: Home / Self Care | Payer: 59 | Source: Ambulatory Visit | Attending: Radiation Oncology | Admitting: Radiation Oncology

## 2022-03-16 DIAGNOSIS — C50411 Malignant neoplasm of upper-outer quadrant of right female breast: Secondary | ICD-10-CM

## 2022-03-16 DIAGNOSIS — Z5111 Encounter for antineoplastic chemotherapy: Secondary | ICD-10-CM | POA: Diagnosis not present

## 2022-03-16 LAB — RAD ONC ARIA SESSION SUMMARY
Course Elapsed Days: 22
Plan Fractions Treated to Date: 16
Plan Fractions Treated to Date: 8
Plan Prescribed Dose Per Fraction: 1.8 Gy
Plan Prescribed Dose Per Fraction: 1.8 Gy
Plan Total Fractions Prescribed: 14
Plan Total Fractions Prescribed: 28
Plan Total Prescribed Dose: 25.2 Gy
Plan Total Prescribed Dose: 50.4 Gy
Reference Point Dosage Given to Date: 28.8 Gy
Reference Point Dosage Given to Date: 28.8 Gy
Reference Point Session Dosage Given: 1.8 Gy
Reference Point Session Dosage Given: 1.8 Gy
Session Number: 16

## 2022-03-16 LAB — CMP (CANCER CENTER ONLY)
ALT: 23 U/L (ref 0–44)
AST: 27 U/L (ref 15–41)
Albumin: 4.1 g/dL (ref 3.5–5.0)
Alkaline Phosphatase: 117 U/L (ref 38–126)
Anion gap: 6 (ref 5–15)
BUN: 13 mg/dL (ref 6–20)
CO2: 29 mmol/L (ref 22–32)
Calcium: 9.5 mg/dL (ref 8.9–10.3)
Chloride: 105 mmol/L (ref 98–111)
Creatinine: 0.93 mg/dL (ref 0.44–1.00)
GFR, Estimated: >60 mL/min (ref >60)
Glucose, Bld: 100 mg/dL — ABNORMAL HIGH (ref 70–99)
Potassium: 3.7 mmol/L (ref 3.5–5.1)
Sodium: 140 mmol/L (ref 135–145)
Total Bilirubin: 0.6 mg/dL (ref 0.3–1.2)
Total Protein: 7.2 g/dL (ref 6.5–8.1)

## 2022-03-16 NOTE — Telephone Encounter (Addendum)
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 Exam has been scheduled for 03/27/22 at [place], RN, BSN, CPN Clinical Research Nurse I [phone]  03/16/2022 11:17 AM

## 2022-03-19 ENCOUNTER — Ambulatory Visit
Admission: RE | Admit: 2022-03-19 | Discharge: 2022-03-19 | Disposition: A | Discharge status: Home / Self Care | Payer: 59 | Source: Ambulatory Visit | Attending: Radiation Oncology | Admitting: Radiation Oncology

## 2022-03-19 ENCOUNTER — Other Ambulatory Visit: Payer: Self-pay

## 2022-03-19 ENCOUNTER — Inpatient Hospital Stay: Payer: 59

## 2022-03-19 VITALS — BP 110/58 | HR 72 | Temp 98.1°F | Resp 18

## 2022-03-19 DIAGNOSIS — Z5111 Encounter for antineoplastic chemotherapy: Secondary | ICD-10-CM | POA: Diagnosis not present

## 2022-03-19 DIAGNOSIS — C50411 Malignant neoplasm of upper-outer quadrant of right female breast: Secondary | ICD-10-CM

## 2022-03-19 DIAGNOSIS — Z95828 Presence of other vascular implants and grafts: Secondary | ICD-10-CM

## 2022-03-19 LAB — RAD ONC ARIA SESSION SUMMARY
Course Elapsed Days: 25
Plan Fractions Treated to Date: 17
Plan Fractions Treated to Date: 9
Plan Prescribed Dose Per Fraction: 1.8 Gy
Plan Prescribed Dose Per Fraction: 1.8 Gy
Plan Total Fractions Prescribed: 14
Plan Total Fractions Prescribed: 28
Plan Total Prescribed Dose: 25.2 Gy
Plan Total Prescribed Dose: 50.4 Gy
Reference Point Dosage Given to Date: 30.6 Gy
Reference Point Dosage Given to Date: 30.6 Gy
Reference Point Session Dosage Given: 1.8 Gy
Reference Point Session Dosage Given: 1.8 Gy
Session Number: 17

## 2022-03-19 MED ORDER — GOSERELIN ACETATE 3.6 MG ~~LOC~~ IMPL
3.6000 mg | DRUG_IMPLANT | Freq: Once | SUBCUTANEOUS | Status: AC
  Administered 2022-03-19: 3.6 mg via SUBCUTANEOUS
  Filled 2022-03-19: qty 3.6

## 2022-03-19 NOTE — Patient Instructions (Signed)
Goserelin injection What is this medication? GOSERELIN (GOE se rel in) is similar to a hormone found in the body. It lowers the amount of sex hormones that the body makes. Men will have lower testosterone levels and women will have lower estrogen levels while taking this medicine. In men, this medicine is used to treat prostate cancer; the injection is either given once per month or once every 12 weeks. A once per month injection (only) is used to treat women with endometriosis, dysfunctional uterine bleeding, or advanced breast cancer. This medicine may be used for other purposes; ask your health care provider or pharmacist if you have questions. COMMON BRAND NAME(S): Zoladex, Zoladex 37-Month What should I tell my care team before I take this medication? They need to know if you have any of these conditions: bone problems diabetes heart disease history of irregular heartbeat an unusual or allergic reaction to goserelin, other medicines, foods, dyes, or preservatives pregnant or trying to get pregnant breast-feeding How should I use this medication? This medicine is for injection under the skin. It is given by a health care professional in a hospital or clinic setting. Talk to your pediatrician regarding the use of this medicine in children. Special care may be needed. Overdosage: If you think you have taken too much of this medicine contact a poison control center or emergency room at once. NOTE: This medicine is only for you. Do not share this medicine with others. What if I miss a dose? It is important not to miss your dose. Call your doctor or health care professional if you are unable to keep an appointment. What may interact with this medication? Do not take this medicine with any of the following medications: cisapride dronedarone pimozide thioridazine This medicine may also interact with the following medications: other medicines that prolong the QT interval (an abnormal heart  rhythm) This list may not describe all possible interactions. Give your health care provider a list of all the medicines, herbs, non-prescription drugs, or dietary supplements you use. Also tell them if you smoke, drink alcohol, or use illegal drugs. Some items may interact with your medicine. What should I watch for while using this medication? Visit your doctor or health care provider for regular checks on your progress. Your symptoms may appear to get worse during the first weeks of this therapy. Tell your doctor or healthcare provider if your symptoms do not start to get better or if they get worse after this time. Your bones may get weaker if you take this medicine for a long time. If you smoke or frequently drink alcohol you may increase your risk of bone loss. A family history of osteoporosis, chronic use of drugs for seizures (convulsions), or corticosteroids can also increase your risk of bone loss. Talk to your doctor about how to keep your bones strong. This medicine should stop regular monthly menstruation in women. Tell your doctor if you continue to menstruate. Women should not become pregnant while taking this medicine or for 12 weeks after stopping this medicine. Women should inform their doctor if they wish to become pregnant or think they might be pregnant. There is a potential for serious side effects to an unborn child. Talk to your health care professional or pharmacist for more information. Do not breast-feed an infant while taking this medicine. Men should inform their doctors if they wish to father a child. This medicine may lower sperm counts. Talk to your health care professional or pharmacist for more information. This  medicine may increase blood sugar. Ask your healthcare provider if changes in diet or medicines are needed if you have diabetes. What side effects may I notice from receiving this medication? Side effects that you should report to your doctor or health care  professional as soon as possible: allergic reactions like skin rash, itching or hives, swelling of the face, lips, or tongue bone pain breathing problems changes in vision chest pain feeling faint or lightheaded, falls fever, chills pain, swelling, warmth in the leg pain, tingling, numbness in the hands or feet signs and symptoms of high blood sugar such as being more thirsty or hungry or having to urinate more than normal. You may also feel very tired or have blurry vision signs and symptoms of low blood pressure like dizziness; feeling faint or lightheaded, falls; unusually weak or tired stomach pain swelling of the ankles, feet, hands trouble passing urine or change in the amount of urine unusually high or low blood pressure unusually weak or tired Side effects that usually do not require medical attention (report to your doctor or health care professional if they continue or are bothersome): change in sex drive or performance changes in breast size in both males and females changes in emotions or moods headache hot flashes irritation at site where injected loss of appetite skin problems like acne, dry skin vaginal dryness This list may not describe all possible side effects. Call your doctor for medical advice about side effects. You may report side effects to FDA at 1-800-FDA-1088. Where should I keep my medication? This drug is given in a hospital or clinic and will not be stored at home. NOTE: This sheet is a summary. It may not cover all possible information. If you have questions about this medicine, talk to your doctor, pharmacist, or health care provider.  2023 Elsevier/Gold Standard (2019-01-23 00:00:00)  

## 2022-03-20 ENCOUNTER — Other Ambulatory Visit: Payer: Self-pay

## 2022-03-20 ENCOUNTER — Ambulatory Visit
Admission: RE | Admit: 2022-03-20 | Discharge: 2022-03-20 | Disposition: A | Discharge status: Home / Self Care | Payer: 59 | Source: Ambulatory Visit | Attending: Radiation Oncology | Admitting: Radiation Oncology

## 2022-03-20 ENCOUNTER — Ambulatory Visit: Payer: 59

## 2022-03-20 DIAGNOSIS — Z5111 Encounter for antineoplastic chemotherapy: Secondary | ICD-10-CM | POA: Diagnosis not present

## 2022-03-20 LAB — RAD ONC ARIA SESSION SUMMARY
Course Elapsed Days: 26
Plan Fractions Treated to Date: 18
Plan Fractions Treated to Date: 9
Plan Prescribed Dose Per Fraction: 1.8 Gy
Plan Prescribed Dose Per Fraction: 1.8 Gy
Plan Total Fractions Prescribed: 14
Plan Total Fractions Prescribed: 28
Plan Total Prescribed Dose: 25.2 Gy
Plan Total Prescribed Dose: 50.4 Gy
Reference Point Dosage Given to Date: 32.4 Gy
Reference Point Dosage Given to Date: 32.4 Gy
Reference Point Session Dosage Given: 1.8 Gy
Reference Point Session Dosage Given: 1.8 Gy
Session Number: 18

## 2022-03-21 ENCOUNTER — Other Ambulatory Visit: Payer: Self-pay

## 2022-03-21 ENCOUNTER — Ambulatory Visit
Admission: RE | Admit: 2022-03-21 | Discharge: 2022-03-21 | Disposition: A | Discharge status: Home / Self Care | Payer: 59 | Source: Ambulatory Visit | Attending: Radiation Oncology | Admitting: Radiation Oncology

## 2022-03-21 DIAGNOSIS — Z5111 Encounter for antineoplastic chemotherapy: Secondary | ICD-10-CM | POA: Diagnosis not present

## 2022-03-21 LAB — RAD ONC ARIA SESSION SUMMARY
Course Elapsed Days: 27
Plan Fractions Treated to Date: 10
Plan Fractions Treated to Date: 19
Plan Prescribed Dose Per Fraction: 1.8 Gy
Plan Prescribed Dose Per Fraction: 1.8 Gy
Plan Total Fractions Prescribed: 14
Plan Total Fractions Prescribed: 28
Plan Total Prescribed Dose: 25.2 Gy
Plan Total Prescribed Dose: 50.4 Gy
Reference Point Dosage Given to Date: 34.2 Gy
Reference Point Dosage Given to Date: 34.2 Gy
Reference Point Session Dosage Given: 1.8 Gy
Reference Point Session Dosage Given: 1.8 Gy
Session Number: 19

## 2022-03-22 ENCOUNTER — Ambulatory Visit
Admission: RE | Admit: 2022-03-22 | Discharge: 2022-03-22 | Disposition: A | Discharge status: Home / Self Care | Payer: 59 | Source: Ambulatory Visit | Attending: Radiation Oncology | Admitting: Radiation Oncology

## 2022-03-22 ENCOUNTER — Other Ambulatory Visit: Payer: Self-pay

## 2022-03-22 DIAGNOSIS — Z5111 Encounter for antineoplastic chemotherapy: Secondary | ICD-10-CM | POA: Diagnosis not present

## 2022-03-22 DIAGNOSIS — Z170 Estrogen receptor positive status [ER+]: Secondary | ICD-10-CM

## 2022-03-22 LAB — RAD ONC ARIA SESSION SUMMARY
Course Elapsed Days: 28
Plan Fractions Treated to Date: 10
Plan Fractions Treated to Date: 20
Plan Prescribed Dose Per Fraction: 1.8 Gy
Plan Prescribed Dose Per Fraction: 1.8 Gy
Plan Total Fractions Prescribed: 14
Plan Total Fractions Prescribed: 28
Plan Total Prescribed Dose: 25.2 Gy
Plan Total Prescribed Dose: 50.4 Gy
Reference Point Dosage Given to Date: 36 Gy
Reference Point Dosage Given to Date: 36 Gy
Reference Point Session Dosage Given: 1.8 Gy
Reference Point Session Dosage Given: 1.8 Gy
Session Number: 20

## 2022-03-23 ENCOUNTER — Ambulatory Visit
Admission: RE | Admit: 2022-03-23 | Discharge: 2022-03-23 | Disposition: A | Discharge status: Home / Self Care | Payer: 59 | Source: Ambulatory Visit | Attending: Radiation Oncology | Admitting: Radiation Oncology

## 2022-03-23 ENCOUNTER — Other Ambulatory Visit: Payer: Self-pay

## 2022-03-23 DIAGNOSIS — Z5111 Encounter for antineoplastic chemotherapy: Secondary | ICD-10-CM | POA: Diagnosis not present

## 2022-03-23 LAB — RAD ONC ARIA SESSION SUMMARY
Course Elapsed Days: 29
Plan Fractions Treated to Date: 11
Plan Fractions Treated to Date: 21
Plan Prescribed Dose Per Fraction: 1.8 Gy
Plan Prescribed Dose Per Fraction: 1.8 Gy
Plan Total Fractions Prescribed: 14
Plan Total Fractions Prescribed: 28
Plan Total Prescribed Dose: 25.2 Gy
Plan Total Prescribed Dose: 50.4 Gy
Reference Point Dosage Given to Date: 37.8 Gy
Reference Point Dosage Given to Date: 37.8 Gy
Reference Point Session Dosage Given: 1.8 Gy
Reference Point Session Dosage Given: 1.8 Gy
Session Number: 21

## 2022-03-26 ENCOUNTER — Other Ambulatory Visit: Payer: Self-pay

## 2022-03-26 ENCOUNTER — Ambulatory Visit
Admission: RE | Admit: 2022-03-26 | Discharge: 2022-03-26 | Disposition: A | Discharge status: Home / Self Care | Payer: 59 | Source: Ambulatory Visit | Attending: Radiation Oncology | Admitting: Radiation Oncology

## 2022-03-26 DIAGNOSIS — Z5111 Encounter for antineoplastic chemotherapy: Secondary | ICD-10-CM | POA: Diagnosis not present

## 2022-03-26 LAB — RAD ONC ARIA SESSION SUMMARY
Course Elapsed Days: 32
Plan Fractions Treated to Date: 11
Plan Fractions Treated to Date: 22
Plan Prescribed Dose Per Fraction: 1.8 Gy
Plan Prescribed Dose Per Fraction: 1.8 Gy
Plan Total Fractions Prescribed: 14
Plan Total Fractions Prescribed: 28
Plan Total Prescribed Dose: 25.2 Gy
Plan Total Prescribed Dose: 50.4 Gy
Reference Point Dosage Given to Date: 39.6 Gy
Reference Point Dosage Given to Date: 39.6 Gy
Reference Point Session Dosage Given: 1.8 Gy
Reference Point Session Dosage Given: 1.8 Gy
Session Number: 22

## 2022-03-27 ENCOUNTER — Ambulatory Visit
Admission: RE | Admit: 2022-03-27 | Discharge: 2022-03-27 | Disposition: A | Discharge status: Home / Self Care | Payer: 59 | Source: Ambulatory Visit | Attending: Radiation Oncology | Admitting: Radiation Oncology

## 2022-03-27 ENCOUNTER — Telehealth: Payer: Self-pay

## 2022-03-27 ENCOUNTER — Ambulatory Visit (HOSPITAL_COMMUNITY)
Admission: RE | Admit: 2022-03-27 | Discharge: 2022-03-27 | Disposition: A | Discharge status: Home / Self Care | Payer: 59 | Source: Ambulatory Visit | Attending: Hematology and Oncology | Admitting: Hematology and Oncology

## 2022-03-27 ENCOUNTER — Other Ambulatory Visit: Payer: Self-pay

## 2022-03-27 DIAGNOSIS — C50411 Malignant neoplasm of upper-outer quadrant of right female breast: Secondary | ICD-10-CM

## 2022-03-27 DIAGNOSIS — Z5181 Encounter for therapeutic drug level monitoring: Secondary | ICD-10-CM | POA: Insufficient documentation

## 2022-03-27 DIAGNOSIS — Z5111 Encounter for antineoplastic chemotherapy: Secondary | ICD-10-CM | POA: Diagnosis not present

## 2022-03-27 DIAGNOSIS — Z79899 Other long term (current) drug therapy: Secondary | ICD-10-CM | POA: Insufficient documentation

## 2022-03-27 LAB — ECHOCARDIOGRAM COMPLETE
AR max vel: 2.73 cm2
AV Peak grad: 5.9 mmHg
Ao pk vel: 1.22 m/s
Area-P 1/2: 3.65 cm2
S' Lateral: 3.6 cm

## 2022-03-27 LAB — RAD ONC ARIA SESSION SUMMARY
Course Elapsed Days: 33
Plan Fractions Treated to Date: 12
Plan Fractions Treated to Date: 23
Plan Prescribed Dose Per Fraction: 1.8 Gy
Plan Prescribed Dose Per Fraction: 1.8 Gy
Plan Total Fractions Prescribed: 14
Plan Total Fractions Prescribed: 28
Plan Total Prescribed Dose: 25.2 Gy
Plan Total Prescribed Dose: 50.4 Gy
Reference Point Dosage Given to Date: 41.4 Gy
Reference Point Dosage Given to Date: 41.4 Gy
Reference Point Session Dosage Given: 1.8 Gy
Reference Point Session Dosage Given: 1.8 Gy
Session Number: 23

## 2022-03-27 MED ORDER — RADIAPLEXRX EX GEL: Freq: Once | CUTANEOUS | Status: AC

## 2022-03-27 NOTE — Telephone Encounter (Signed)
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 Also reminded her that she should not take her AM dose of tucatinib/placebo that day, as she will take it in clinic after labs are drawn.  Vickii Penna, RN, BSN, CPN Clinical Research Nurse I [phone]  03/27/2022 1:38 PM

## 2022-03-28 ENCOUNTER — Other Ambulatory Visit: Payer: Self-pay

## 2022-03-28 ENCOUNTER — Ambulatory Visit
Admission: RE | Admit: 2022-03-28 | Discharge: 2022-03-28 | Disposition: A | Discharge status: Home / Self Care | Payer: 59 | Source: Ambulatory Visit | Attending: Radiation Oncology | Admitting: Radiation Oncology

## 2022-03-28 DIAGNOSIS — Z5111 Encounter for antineoplastic chemotherapy: Secondary | ICD-10-CM | POA: Diagnosis not present

## 2022-03-28 LAB — RAD ONC ARIA SESSION SUMMARY
Course Elapsed Days: 34
Plan Fractions Treated to Date: 12
Plan Fractions Treated to Date: 24
Plan Prescribed Dose Per Fraction: 1.8 Gy
Plan Prescribed Dose Per Fraction: 1.8 Gy
Plan Total Fractions Prescribed: 14
Plan Total Fractions Prescribed: 28
Plan Total Prescribed Dose: 25.2 Gy
Plan Total Prescribed Dose: 50.4 Gy
Reference Point Dosage Given to Date: 43.2 Gy
Reference Point Dosage Given to Date: 43.2 Gy
Reference Point Session Dosage Given: 1.8 Gy
Reference Point Session Dosage Given: 1.8 Gy
Session Number: 24

## 2022-03-29 ENCOUNTER — Other Ambulatory Visit: Payer: Self-pay

## 2022-03-29 ENCOUNTER — Ambulatory Visit
Admission: RE | Admit: 2022-03-29 | Discharge: 2022-03-29 | Disposition: A | Discharge status: Home / Self Care | Payer: 59 | Source: Ambulatory Visit | Attending: Radiation Oncology | Admitting: Radiation Oncology

## 2022-03-29 ENCOUNTER — Other Ambulatory Visit: Payer: Self-pay | Admitting: *Deleted

## 2022-03-29 ENCOUNTER — Inpatient Hospital Stay: Payer: 59

## 2022-03-29 ENCOUNTER — Inpatient Hospital Stay (HOSPITAL_BASED_OUTPATIENT_CLINIC_OR_DEPARTMENT_OTHER): Payer: 59 | Admitting: Hematology and Oncology

## 2022-03-29 VITALS — BP 107/50 | HR 72 | Temp 98.7°F | Resp 18

## 2022-03-29 DIAGNOSIS — C50411 Malignant neoplasm of upper-outer quadrant of right female breast: Secondary | ICD-10-CM

## 2022-03-29 DIAGNOSIS — Z5111 Encounter for antineoplastic chemotherapy: Secondary | ICD-10-CM | POA: Diagnosis not present

## 2022-03-29 DIAGNOSIS — Z95828 Presence of other vascular implants and grafts: Secondary | ICD-10-CM

## 2022-03-29 DIAGNOSIS — Z170 Estrogen receptor positive status [ER+]: Secondary | ICD-10-CM

## 2022-03-29 DIAGNOSIS — Z006 Encounter for examination for normal comparison and control in clinical research program: Secondary | ICD-10-CM | POA: Diagnosis not present

## 2022-03-29 LAB — RAD ONC ARIA SESSION SUMMARY
Course Elapsed Days: 35
Plan Fractions Treated to Date: 13
Plan Fractions Treated to Date: 25
Plan Prescribed Dose Per Fraction: 1.8 Gy
Plan Prescribed Dose Per Fraction: 1.8 Gy
Plan Total Fractions Prescribed: 14
Plan Total Fractions Prescribed: 28
Plan Total Prescribed Dose: 25.2 Gy
Plan Total Prescribed Dose: 50.4 Gy
Reference Point Dosage Given to Date: 45 Gy
Reference Point Dosage Given to Date: 45 Gy
Reference Point Session Dosage Given: 1.8 Gy
Reference Point Session Dosage Given: 1.8 Gy
Session Number: 25

## 2022-03-29 LAB — CBC WITH DIFFERENTIAL (CANCER CENTER ONLY)
Abs Immature Granulocytes: 0.02 10*3/uL (ref 0.00–0.07)
Basophils Absolute: 0 10*3/uL (ref 0.0–0.1)
Basophils Relative: 1 %
Eosinophils Absolute: 0.3 10*3/uL (ref 0.0–0.5)
Eosinophils Relative: 7 %
HCT: 37 % (ref 36.0–46.0)
Hemoglobin: 12.6 g/dL (ref 12.0–15.0)
Immature Granulocytes: 1 %
Lymphocytes Relative: 27 %
Lymphs Abs: 1.2 10*3/uL (ref 0.7–4.0)
MCH: 31.5 pg (ref 26.0–34.0)
MCHC: 34.1 g/dL (ref 30.0–36.0)
MCV: 92.5 fL (ref 80.0–100.0)
Monocytes Absolute: 0.4 10*3/uL (ref 0.1–1.0)
Monocytes Relative: 8 %
Neutro Abs: 2.5 10*3/uL (ref 1.7–7.7)
Neutrophils Relative %: 56 %
Platelet Count: 229 10*3/uL (ref 150–400)
RBC: 4 MIL/uL (ref 3.87–5.11)
RDW: 14.6 % (ref 11.5–15.5)
WBC Count: 4.4 10*3/uL (ref 4.0–10.5)
nRBC: 0 % (ref 0.0–0.2)

## 2022-03-29 LAB — CMP (CANCER CENTER ONLY)
ALT: 19 U/L (ref 0–44)
AST: 19 U/L (ref 15–41)
Albumin: 4.1 g/dL (ref 3.5–5.0)
Alkaline Phosphatase: 90 U/L (ref 38–126)
Anion gap: 6 (ref 5–15)
BUN: 12 mg/dL (ref 6–20)
CO2: 27 mmol/L (ref 22–32)
Calcium: 9.4 mg/dL (ref 8.9–10.3)
Chloride: 107 mmol/L (ref 98–111)
Creatinine: 0.91 mg/dL (ref 0.44–1.00)
GFR, Estimated: >60 mL/min (ref >60)
Glucose, Bld: 92 mg/dL (ref 70–99)
Potassium: 3.9 mmol/L (ref 3.5–5.1)
Sodium: 140 mmol/L (ref 135–145)
Total Bilirubin: 0.5 mg/dL (ref 0.3–1.2)
Total Protein: 6.9 g/dL (ref 6.5–8.1)

## 2022-03-29 LAB — RESEARCH LABS

## 2022-03-29 LAB — PREGNANCY, URINE: Preg Test, Ur: NEGATIVE

## 2022-03-29 MED ORDER — ACETAMINOPHEN 325 MG PO TABS
650.0000 mg | ORAL_TABLET | Freq: Once | ORAL | Status: AC
  Administered 2022-03-29: 650 mg via ORAL
  Filled 2022-03-29: qty 2

## 2022-03-29 MED ORDER — SODIUM CHLORIDE 0.9% FLUSH
10.0000 mL | INTRAVENOUS | Status: DC | PRN
  Administered 2022-03-29: 10 mL

## 2022-03-29 MED ORDER — SODIUM CHLORIDE 0.9 % IV SOLN: Freq: Once | INTRAVENOUS | Status: AC

## 2022-03-29 MED ORDER — SODIUM CHLORIDE 0.9% FLUSH
10.0000 mL | Freq: Once | INTRAVENOUS | Status: AC
  Administered 2022-03-29: 10 mL

## 2022-03-29 MED ORDER — SODIUM CHLORIDE 0.9 % IV SOLN
3.6000 mg/kg | Freq: Once | INTRAVENOUS | Status: AC
  Administered 2022-03-29: 220 mg via INTRAVENOUS
  Filled 2022-03-29: qty 5

## 2022-03-29 MED ORDER — DIPHENHYDRAMINE HCL 25 MG PO CAPS
50.0000 mg | ORAL_CAPSULE | Freq: Once | ORAL | Status: AC
  Administered 2022-03-29: 50 mg via ORAL
  Filled 2022-03-29: qty 2

## 2022-03-29 MED ORDER — INV-TUCATINIB/PLACEBO 150 MG TABLET ALLIANCE A011801: 300.0000 mg | ORAL_TABLET | Freq: Two times a day (BID) | ORAL | 0 refills | Status: DC

## 2022-03-29 MED ORDER — VENLAFAXINE HCL ER 37.5 MG PO CP24: 37.5000 mg | ORAL_CAPSULE | Freq: Every day | ORAL | 0 refills | Status: DC

## 2022-03-29 MED ORDER — HEPARIN SOD (PORK) LOCK FLUSH 100 UNIT/ML IV SOLN
500.0000 [IU] | Freq: Once | INTRAVENOUS | Status: AC | PRN
  Administered 2022-03-29: 500 [IU]

## 2022-03-29 NOTE — Assessment & Plan Note (Addendum)
Palpable lump in the right breast. Diagnostic mammogram and [place]: a 3.8 cm mass at the retroareolar right breast, 1 prominent right axillary lymph node with a 4 mm cortex, 1 other which is borderline measuring 3 mm. 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 3 o'clock position: Biopsy fibroadenoma  Treatment plan: 1. Neoadjuvant chemotherapy with TCH Perjeta 6 cycles followed by Herceptin Perjeta maintenance versus Kadcyla maintenance (based on response to neoadjuvant chemo) for 1 year 2.Bilateral Mastectomies: Left mastectomy: Benign Right mastectomy: Multifocal ILC 1.8 cm, 0.9 cm, margins negative, 1/2 lymph nodes positive, ER 50%, PR 30%, HER2 1+ IHC 3.Compass HER2 clinical trial: Randomization between Kadcyla versus Kadcyla plus Tucatinib 4.Followed by adjuvant radiation therapy 5.Followed by antiestrogen therapy  ------------------------------------------------------------------------------------------------------------------------ Current treatment: Kadcylacycle 2 maintenance (Compass HER2 clinical trial) + Tucatinib vs Placebo Echocardiogram March 2023: EF 55 to 60%  Hospitalization 01/22/2022-01/26/2022 abscess/cellulitis required removal of the implant The plan is now a delayed reconstruction  Adverse Effects: Tolerating it well 1. Mild diarrhea: subsided 2. Headaches: Resolved  Radiation: 02/23/22- 04/13/22 Return to clinic every 3 weeks for Kadcyla

## 2022-03-29 NOTE — Progress Notes (Signed)
Per Dr Lindi Adie, ok to proceed with Kadcyla with pending urine protein.

## 2022-03-29 NOTE — Progress Notes (Signed)
Patient Care Team: Dian Queen, MD as PCP - General (Obstetrics and Gynecology) Dian Queen, MD (Obstetrics and Gynecology) Mauro Kaufmann, RN as Oncology Nurse Navigator Carlynn Spry, Charlott Holler, RN as Oncology Nurse Navigator Nicholas Lose, MD as Consulting Physician (Hematology and Oncology) Gery Pray, MD as Consulting Physician (Radiation Oncology) Rolm Bookbinder, MD as Consulting Physician (General Surgery)  DIAGNOSIS:  Encounter Diagnosis  Name Primary?   Malignant neoplasm of upper-outer quadrant of right breast in female, estrogen receptor positive ([place])     SUMMARY OF ONCOLOGIC HISTORY: Oncology History  Malignant neoplasm of upper-outer quadrant of right breast in female, estrogen receptor positive ([place])  06/23/2021 Initial Diagnosis   Palpable lump in the right breast. Diagnostic mammogram and [place]: a 3.8 cm mass at the retroareolar right breast, 1 prominent right axillary lymph node with a 4 mm cortex, 1 other which is borderline measuring 3 mm. 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  3 o'clock position: Biopsy fibroadenoma   07/12/2021 -  Neo-Adjuvant Chemotherapy   Neoadjuvant chemotherapy with TCH Perjeta 6 cycles followed by Herceptin Perjeta maintenance versus Kadcyla maintenance (based on response to neoadjuvant chemo) for 1 year   07/28/2021 Genetic Testing   Negative hereditary cancer genetic testing: no pathogenic variants detected in [facility] CancerNext-Expanded +RNAinsight Panel.  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  The report date is July 28, 2021.   The CancerNext-Expanded gene panel offered by [facility] and includes sequencing, rearrangement, and RNA analysis for the following 77 genes: AIP, ALK, APC, ATM, AXIN2, BAP1, BARD1, BLM, BMPR1A, BRCA1, BRCA2, BRIP1, CDC73, CDH1, CDK4, CDKN1B, CDKN2A, CHEK2, CTNNA1, DICER1, FANCC, FH,  FLCN, GALNT12, KIF1B, LZTR1, MAX, MEN1, MET, MLH1, MSH2, MSH3, MSH6, MUTYH, NBN, NF1, NF2, NTHL1, PALB2, PHOX2B, PMS2, POT1, PRKAR1A, PTCH1, PTEN, RAD51C, RAD51D, RB1, RECQL, RET, SDHA, SDHAF2, SDHB, SDHC, SDHD, SMAD4, SMARCA4, SMARCB1, SMARCE1, STK11, SUFU, TMEM127, TP53, TSC1, TSC2, VHL and XRCC2 (sequencing and deletion/duplication); EGFR, EGLN1, HOXB13, KIT, MITF, PDGFRA, POLD1, and POLE (sequencing only); EPCAM and GREM1 (deletion/duplication only).    12/06/2021 Surgery   Bilateral Mastectomies: Left mastectomy: Benign Right mastectomy: Multifocal ILC 1.8 cm, 0.9 cm, margins negative, 1/2 lymph nodes positive, ER 50%, PR 30%, HER2 1+ IHC   12/26/2021 - 12/29/2021 Chemotherapy   Patient is on Treatment Plan : BREAST ADO-Trastuzumab Emtansine (Kadcyla) q21d       CHIEF COMPLIANT: Follow-up of right breast cancer on Kadcyla.  INTERVAL HISTORY: Jenna Cross is a 36 y.o. with above-mentioned history of right breast cancer, currently on chemotherapy with TCHP. She presents to the clinic today for follow-up. States that a couple days after she had a touch of nausea. States that she had some diarrhea. States that it was 2-3 stools a day. States that her headaches has resolved. Overall she is tolerating the Kadcyla. Denies any rashes.   ALLERGIES:  is allergic to wound dressings.  MEDICATIONS:  Current Outpatient Medications  Medication Sig Dispense Refill   acetaminophen (TYLENOL) 500 MG tablet Take 1,000 mg by mouth every 6 (six) hours as needed for mild pain or headache.     ALPRAZolam (XANAX) 0.5 MG tablet Take 0.5 mg by mouth 3 (three) times daily as needed for anxiety.     diphenoxylate-atropine (LOMOTIL) 2.5-0.025 MG tablet Take 1 tablet by mouth 3 (three) times daily as needed for diarrhea or loose stools. 30 tablet 3   Investigational tucatanib/placebo 150 MG tablet (ONT-380,NSC #[id number]) [employer] [id number] Take  2 tablets (300 mg total) 2 (two) times dailyfor 21 days.  Store in  Mult[employer]. Tucatinib or placebo should take orally twice daily with or without food, approximately 12 hours apart between doses. Tablets must be swallowed whole and may not be crushed, chewed or dissolved in liquid. CAUTION: Chemotherapy/Biotherapy 84 tablet 0   lidocaine-prilocaine (EMLA) cream lidocaine-prilocaine 2.5 %-2.5 % topical cream  APPLY TO AFFECTED AREA ONCE AS DIRECTED     loratadine (CLARITIN) 10 MG tablet Take 10 mg by mouth daily as needed for allergies.     ondansetron (ZOFRAN) 8 MG tablet Take 1 tablet (8 mg total) by mouth every 8 (eight) hours as needed for nausea. 30 tablet 3   predniSONE (STERAPRED UNI-PAK 21 TAB) 10 MG (21) TBPK tablet 6 day taper; take as directed on package instructions 21 tablet 0   rivaroxaban (XARELTO) 20 MG TABS tablet Take 1 tablet (20 mg total) by mouth daily with supper. (Patient taking differently: Take 20 mg by mouth daily.) 30 tablet 5   valACYclovir (VALTREX) 500 MG tablet Take 500 mg by mouth every evening.     venlafaxine XR (EFFEXOR-XR) 37.5 MG 24 hr capsule Take 1 capsule (37.5 mg total) by mouth daily with breakfast. 30 capsule 0   No current facility-administered medications for this visit.    PHYSICAL EXAMINATION: ECOG PERFORMANCE STATUS: 1 - Symptomatic but completely ambulatory  Vitals:   03/29/22 0848  BP: 108/72  Pulse: 96  Resp: 17  Temp: 97.7 F (36.5 C)  SpO2: 97%   Filed Weights   03/29/22 0848  Weight: 132 lb 8 oz (60.1 kg)      LABORATORY DATA:  I have reviewed the data as listed    Latest Ref Rng & Units 03/16/2022    8:20 AM 03/08/2022    2:07 PM 02/27/2022    8:28 AM  CMP  Glucose 70 - 99 mg/dL 100  87  104   BUN 6 - 20 mg/dL 13  12  10    Creatinine 0.44 - 1.00 mg/dL 0.93  0.87  0.97   Sodium 135 - 145 mmol/L 140  140  141   Potassium 3.5 - 5.1 mmol/L 3.7  3.6  3.5   Chloride 98 - 111 mmol/L 105  106  107   CO2 22 - 32 mmol/L 29  27  28    Calcium 8.9 - 10.3 mg/dL 9.5  9.5  8.8   Total Protein 6.5 -  8.1 g/dL 7.2  6.8  6.9   Total Bilirubin 0.3 - 1.2 mg/dL 0.6  0.6  0.5   Alkaline Phos 38 - 126 U/L 117  89  89   AST 15 - 41 U/L 27  18  37   ALT 0 - 44 U/L 23  17  35     Lab Results  Component Value Date   WBC 4.4 03/29/2022   HGB 12.6 03/29/2022   HCT 37.0 03/29/2022   MCV 92.5 03/29/2022   PLT 229 03/29/2022   NEUTROABS 2.5 03/29/2022    ASSESSMENT & PLAN:  Malignant neoplasm of upper-outer quadrant of right breast in female, estrogen receptor positive ([facility]) Palpable lump in the right breast. Diagnostic mammogram and [place]: a 3.8 cm mass at the retroareolar right breast, 1 prominent right axillary lymph node with a 4 mm cortex, 1 other which is borderline measuring 3 mm. 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  3 o'clock position: Biopsy fibroadenoma  Treatment plan: 1. Neoadjuvant chemotherapy with TCH Perjeta 6 cycles followed by Herceptin Perjeta maintenance versus Kadcyla maintenance (based on response to neoadjuvant chemo) for 1 year 2. Bilateral Mastectomies: Left mastectomy: Benign Right mastectomy: Multifocal ILC 1.8 cm, 0.9 cm, margins negative, 1/2 lymph nodes positive, ER 50%, PR 30%, HER2 1+ IHC 3.  Compass HER2 clinical trial: Randomization between Kadcyla versus Kadcyla plus Tucatinib 4. Followed by adjuvant radiation therapy  5.  Followed by antiestrogen therapy   ------------------------------------------------------------------------------------------------------------------------ Current treatment: Kadcyla cycle 2 maintenance (Compass HER2 clinical trial) + Tucatinib vs Placebo Echocardiogram March 2023: EF 55 to 60%   Hospitalization 01/22/2022-01/26/2022 abscess/cellulitis required removal of the implant The plan is now a delayed reconstruction   Adverse Effects: Tolerating it well 1. Mild diarrhea: subsided 2. Headaches: Resolved  Radiation: 02/23/22- 04/13/22 Return to clinic  every 3 weeks for Kadcyla   No orders of the defined types were placed in this encounter.  The patient has a good understanding of the overall plan. she agrees with it. she will call with any problems that may develop before the next visit here. Total time spent: 30 mins including face to face time and time spent for planning, charting and co-ordination of care   Harriette Ohara, MD 03/29/22    I Gardiner Coins am scribing for Dr. Lindi Adie  I have reviewed the above documentation for accuracy and completeness, and I agree with the above.

## 2022-03-29 NOTE — Patient Instructions (Signed)
[facility] MEDICAL ONCOLOGY  Discharge Instructions: Thank you for choosing [facility] to provide your oncology and hematology care.   If you have a lab appointment with the Cancer Center, please go directly to the Cancer Center and check in at the registration area.   Wear comfortable clothing and clothing appropriate for easy access to any Portacath or PICC line.   We strive to give you quality time with your provider. You may need to reschedule your appointment if you arrive late (15 or more minutes).  Arriving late affects you and other patients whose appointments are after yours.  Also, if you miss three or more appointments without notifying the office, you may be dismissed from the clinic at the provider's discretion.      For prescription refill requests, have your pharmacy contact our office and allow 72 hours for refills to be completed.    Today you received the following chemotherapy and/or immunotherapy agents: Kadcyla      To help prevent nausea and vomiting after your treatment, we encourage you to take your nausea medication as directed.  BELOW ARE SYMPTOMS THAT SHOULD BE REPORTED IMMEDIATELY: *FEVER GREATER THAN 100.4 F (38 C) OR HIGHER *CHILLS OR SWEATING *NAUSEA AND VOMITING THAT IS NOT CONTROLLED WITH YOUR NAUSEA MEDICATION *UNUSUAL SHORTNESS OF BREATH *UNUSUAL BRUISING OR BLEEDING *URINARY PROBLEMS (pain or burning when urinating, or frequent urination) *BOWEL PROBLEMS (unusual diarrhea, constipation, pain near the anus) TENDERNESS IN MOUTH AND THROAT WITH OR WITHOUT PRESENCE OF ULCERS (sore throat, sores in mouth, or a toothache) UNUSUAL RASH, SWELLING OR PAIN  UNUSUAL VAGINAL DISCHARGE OR ITCHING   Items with * indicate a potential emergency and should be followed up as soon as possible or go to the Emergency Department if any problems should occur.  Please show the CHEMOTHERAPY ALERT CARD or IMMUNOTHERAPY ALERT CARD at check-in to  the Emergency Department and triage nurse.  Should you have questions after your visit or need to cancel or reschedule your appointment, please contact [facility]ICAL [facility]COLOG[facility]  Dept: [phone]  and follow the prompts.  Office hours are 8:00 a.m. to 4:30 p.m. Monday - Friday. Please note that voicemails left after 4:00 p.m. may not be returned until the following business day.  We are closed weekends and major holidays. You have access to a nurse at all times for urgent questions. Please call the main number to the clinic Dept: [phone] and follow the prompts.   For any non-urgent questions, you may also contact your provider using MyChart. We now offer e-Visits for anyone 18 and older to request care online for non-urgent symptoms. For details visit [email or web address].   Also download the MyChart app! Go to the app store, search "MyChart", open the app, select [facility], and log in with your MyChart username and password.  Masks are optional in the cancer centers. If you would like for your care team to wear a mask while they are taking care of you, please let them know. For doctor visits, patients may have with them one support person who is at least 37 years old. At this time, visitors are not allowed in the infusion area. 

## 2022-03-29 NOTE — Progress Notes (Signed)
Per Lindi Adie, ok to proceed with tx today with pending urine pregnancy.   Pt observed for 30 minutes post Kadcyla infusion. VSS. No complaints.

## 2022-03-29 NOTE — Addendum Note (Signed)
Addended by: Nicholas Lose on: 03/29/2022 09:06 AM   Modules accepted: Orders

## 2022-03-29 NOTE — Research (Incomplete)
CompassHER2 Residual Disease, A Double-Blinded, Phase III Randomized Trial of T-DM1 and Placebo Compared with T-DMI and Tucatinib  Patient presented to clinic unaccompanied this morning for cycle 3 day 1.  RAD-ONC: Patient had regularly scheduled radiation appointment this morning.  LABS: CBC and CMP collected via port-a-cath per consent and study protocol. Patient tolerated well without complaint.  VITAL SIGNS: Completed, including weight. See vital signs flowsheet.   H&P: Completed by Dr Lindi Adie. See clinic note dated 03/29/22. Provider reviewed lab results and cleared patient to continue treatment.   RESEARCH: Concomitant medications reviewed; Dr Lindi Adie adjusted patient's anti-anxiety medications. Medication list updated and verified with patient.   Solicited and other adverse events assessed per protocol. Patient reported that she had mild diarrhea (grade 1) and nausea (grade 1) for three days following her T-DM1 infusion. Patient also reported ongoing fatigue (grade 2) and ongoing moderate intermittent headache (grade 2). She did report that these headaches are becoming less and less frequent. In addition, patient reported a mild burn (grade 1) on her chest where the skin was exposed to radiation treatments; she first noticed this on 03/17/22. See table below.  Patient returned remaining cycle 2 tucatinib/placebo and medication diary. She returned two bottles, one with 10 tablets remaining and one with 26 tablets remaining (10 + 26 = 36; 100 total tablets dispensed - 36 remaining = 64 tablets taken). Medication diary reflects the same: 64 tablets taken; 32 total doses. Remaining tucatinib/placebo returned to pharmacy. Pill count verified with Kennith Center, PharmD.   Cycle 3 tucatinib/placebo dispensed by pharmacy. Research RN verified that drug had correct patient name and study ID number. Drug was dispensed in two bottles of 50 '150mg'$  tablets, lot #[id number] (bottle 1: [id number], bottle 2: [id number]), exp  05/2023. Patient instructed to continue taking two tablets ('300mg'$  total) orally twice daily and to continue recording doses and side effects on medication diary. Reinforced importance of compliance with study medication. Patient verbalized understanding. Patient was given cycle 3 medication diary.   INFUSION: Patient received clinic standard pre-medications for T-DM1 treatment -- '650mg'$  acetaminophen PO and '50mg'$  diphenhydramine PO. C3D1 PKs were collected per protocol. As patient had a morning appointment, patient held morning dose of tucatinib/placebo until PK was drawn. Pre-dose PK was collected at 0946; patient took morning dose of tucatinib/placebo at 0955. C3D1 T-DM1 infusion started at 1047 and ran over 30 minutes with a 30-minute post-observation period. Post-dose PK was collected after completion of infusion at 1131.  ADVERSE EVENT LOG: Study/Protocol: [facility] [id number] CompassHER2 Cycle 3   Baseline: anxiety (grade 2), fatigue (grade 1), herpes simplex virus, hypotension (grade 1), vascular access complication (grade 3). Not reportable (unrelated/unlikely to be related to study treatment and < grade 3): burn (grade 1).  Event (solicited vs other reportable) Grade CTCAE v5.0 Onset Date Resolved Date Attribution to Study Treatment Treatment Comments  Neutrophil count decreased 0 N/A N/A N/A N/A   Platelet count decreased 0 N/A N/A N/A N/A    Rash (maculo-papular) 0 N/A N/A N/A N/A   Epistaxis 0 N/A N/A N/A N/A    Diarrhea 1 03/09/22 03/12/22 XXX N/A Patient reported a baseline of 2-3 stools/day. Patient reported 2-3 additional stools per day x3 days after T-DM1 infusion.  Nausea 1 03/09/22 03/12/22 XXX N/A Patient reported mild nausea x3 days after T-DM1 infusion.  Vomiting 0 N/A N/A N/A N/A   Bilirubin increased 0 N/A N/A N/A N/A    AST increased 0 N/A N/A N/A N/A    ALT increased 0  N/A N/A N/A N/A    Anemia 0 N/A N/A N/A N/A    Mucositis oral 0 N/A N/A N/A N/A    Palmar-plantar  erythrodysesthesia 0 N/A N/A N/A N/A    Peripheral neuropathy -- motor 0 N/A N/A N/A N/A    Peripheral neuropathy -- sensory 0 N/A N/A N/A N/A    Fatigue 2 02/16/22 Ongoing Probable N/A Grade 1 at baseline.  Headache 2 02/16/22 Ongoing Possible Tylenol PRN Patient reported intermittent symptoms, becoming less and less frequent.   PLAN: Patient is scheduled to complete radiation on 04/13/22. Next treatment visit (C4D1) is scheduled for 04/19/22 and will include labs, a provider visit, and an infusion appointment. Patient reminded to bring remaining tucatinib/placebo tablets and cycle 3 medication diary to C4D1 appointment. Patient verbalized understanding. Research will meet with patient and accompany her to appointments on C4D1. Patient reminded to continue to fill out ePROs as they become available. Patient thanked for her time and continued voluntary participation in this study. Patient has been provided direct contact information and is encouraged to contact this nurse for any questions or concerns.  SIGN

## 2022-03-30 ENCOUNTER — Other Ambulatory Visit: Payer: Self-pay

## 2022-03-30 ENCOUNTER — Telehealth: Payer: Self-pay | Admitting: Hematology and Oncology

## 2022-03-30 ENCOUNTER — Encounter: Payer: Self-pay | Admitting: Hematology and Oncology

## 2022-03-30 ENCOUNTER — Ambulatory Visit
Admission: RE | Admit: 2022-03-30 | Discharge: 2022-03-30 | Disposition: A | Discharge status: Home / Self Care | Payer: 59 | Source: Ambulatory Visit | Attending: Radiation Oncology | Admitting: Radiation Oncology

## 2022-03-30 DIAGNOSIS — Z5111 Encounter for antineoplastic chemotherapy: Secondary | ICD-10-CM | POA: Diagnosis not present

## 2022-03-30 LAB — RAD ONC ARIA SESSION SUMMARY
Course Elapsed Days: 36
Plan Fractions Treated to Date: 13
Plan Fractions Treated to Date: 26
Plan Prescribed Dose Per Fraction: 1.8 Gy
Plan Prescribed Dose Per Fraction: 1.8 Gy
Plan Total Fractions Prescribed: 14
Plan Total Fractions Prescribed: 28
Plan Total Prescribed Dose: 25.2 Gy
Plan Total Prescribed Dose: 50.4 Gy
Reference Point Dosage Given to Date: 46.8 Gy
Reference Point Dosage Given to Date: 46.8 Gy
Reference Point Session Dosage Given: 1.8 Gy
Reference Point Session Dosage Given: 1.8 Gy
Session Number: 26

## 2022-04-01 ENCOUNTER — Encounter: Payer: Self-pay | Admitting: Hematology and Oncology

## 2022-04-02 ENCOUNTER — Ambulatory Visit: Payer: 59 | Attending: Hematology and Oncology

## 2022-04-02 ENCOUNTER — Inpatient Hospital Stay: Payer: 59

## 2022-04-02 ENCOUNTER — Ambulatory Visit
Admission: RE | Admit: 2022-04-02 | Discharge: 2022-04-02 | Disposition: A | Discharge status: Home / Self Care | Payer: 59 | Source: Ambulatory Visit | Attending: Radiation Oncology | Admitting: Radiation Oncology

## 2022-04-02 ENCOUNTER — Telehealth: Payer: Self-pay

## 2022-04-02 ENCOUNTER — Other Ambulatory Visit: Payer: Self-pay

## 2022-04-02 ENCOUNTER — Inpatient Hospital Stay (HOSPITAL_BASED_OUTPATIENT_CLINIC_OR_DEPARTMENT_OTHER): Payer: 59 | Admitting: Physician Assistant

## 2022-04-02 VITALS — BP 108/73 | HR 97 | Temp 98.3°F | Resp 16

## 2022-04-02 DIAGNOSIS — Z95828 Presence of other vascular implants and grafts: Secondary | ICD-10-CM | POA: Diagnosis not present

## 2022-04-02 DIAGNOSIS — C50411 Malignant neoplasm of upper-outer quadrant of right female breast: Secondary | ICD-10-CM

## 2022-04-02 DIAGNOSIS — Z170 Estrogen receptor positive status [ER+]: Secondary | ICD-10-CM | POA: Diagnosis not present

## 2022-04-02 DIAGNOSIS — R590 Localized enlarged lymph nodes: Secondary | ICD-10-CM | POA: Diagnosis not present

## 2022-04-02 DIAGNOSIS — Z5111 Encounter for antineoplastic chemotherapy: Secondary | ICD-10-CM | POA: Diagnosis not present

## 2022-04-02 DIAGNOSIS — R293 Abnormal posture: Secondary | ICD-10-CM | POA: Insufficient documentation

## 2022-04-02 LAB — CMP (CANCER CENTER ONLY)
ALT: 27 U/L (ref 0–44)
AST: 40 U/L (ref 15–41)
Albumin: 4.2 g/dL (ref 3.5–5.0)
Alkaline Phosphatase: 146 U/L — ABNORMAL HIGH (ref 38–126)
Anion gap: 6 (ref 5–15)
BUN: 8 mg/dL (ref 6–20)
CO2: 28 mmol/L (ref 22–32)
Calcium: 9.6 mg/dL (ref 8.9–10.3)
Chloride: 105 mmol/L (ref 98–111)
Creatinine: 0.79 mg/dL (ref 0.44–1.00)
GFR, Estimated: >60 mL/min (ref >60)
Glucose, Bld: 114 mg/dL — ABNORMAL HIGH (ref 70–99)
Potassium: 3.4 mmol/L — ABNORMAL LOW (ref 3.5–5.1)
Sodium: 139 mmol/L (ref 135–145)
Total Bilirubin: 0.4 mg/dL (ref 0.3–1.2)
Total Protein: 7 g/dL (ref 6.5–8.1)

## 2022-04-02 LAB — CBC WITH DIFFERENTIAL (CANCER CENTER ONLY)
Abs Immature Granulocytes: 0.02 10*3/uL (ref 0.00–0.07)
Basophils Absolute: 0 10*3/uL (ref 0.0–0.1)
Basophils Relative: 1 %
Eosinophils Absolute: 0.3 10*3/uL (ref 0.0–0.5)
Eosinophils Relative: 8 %
HCT: 36.3 % (ref 36.0–46.0)
Hemoglobin: 12.7 g/dL (ref 12.0–15.0)
Immature Granulocytes: 1 %
Lymphocytes Relative: 15 %
Lymphs Abs: 0.6 10*3/uL — ABNORMAL LOW (ref 0.7–4.0)
MCH: 31.8 pg (ref 26.0–34.0)
MCHC: 35 g/dL (ref 30.0–36.0)
MCV: 91 fL (ref 80.0–100.0)
Monocytes Absolute: 0.6 10*3/uL (ref 0.1–1.0)
Monocytes Relative: 15 %
Neutro Abs: 2.4 10*3/uL (ref 1.7–7.7)
Neutrophils Relative %: 60 %
Platelet Count: 85 10*3/uL — ABNORMAL LOW (ref 150–400)
RBC: 3.99 MIL/uL (ref 3.87–5.11)
RDW: 14 % (ref 11.5–15.5)
WBC Count: 3.9 10*3/uL — ABNORMAL LOW (ref 4.0–10.5)
nRBC: 0 % (ref 0.0–0.2)

## 2022-04-02 LAB — RAD ONC ARIA SESSION SUMMARY
Course Elapsed Days: 39
Plan Fractions Treated to Date: 14
Plan Fractions Treated to Date: 27
Plan Prescribed Dose Per Fraction: 1.8 Gy
Plan Prescribed Dose Per Fraction: 1.8 Gy
Plan Total Fractions Prescribed: 14
Plan Total Fractions Prescribed: 28
Plan Total Prescribed Dose: 25.2 Gy
Plan Total Prescribed Dose: 50.4 Gy
Reference Point Dosage Given to Date: 48.6 Gy
Reference Point Dosage Given to Date: 48.6 Gy
Reference Point Session Dosage Given: 1.8 Gy
Reference Point Session Dosage Given: 1.8 Gy
Session Number: 27

## 2022-04-02 MED ORDER — INV-TUCATINIB/PLACEBO 150 MG TABLET ALLIANCE A011801: 300.0000 mg | ORAL_TABLET | Freq: Two times a day (BID) | ORAL | 0 refills | Status: AC

## 2022-04-02 MED ORDER — AMOXICILLIN-POT CLAVULANATE 875-125 MG PO TABS: 1.0000 | ORAL_TABLET | Freq: Two times a day (BID) | ORAL | 0 refills | Status: AC

## 2022-04-03 ENCOUNTER — Other Ambulatory Visit: Payer: Self-pay

## 2022-04-03 ENCOUNTER — Ambulatory Visit
Admission: RE | Admit: 2022-04-03 | Discharge: 2022-04-03 | Disposition: A | Discharge status: Home / Self Care | Payer: 59 | Source: Ambulatory Visit | Attending: Radiation Oncology | Admitting: Radiation Oncology

## 2022-04-03 ENCOUNTER — Ambulatory Visit: Payer: 59

## 2022-04-03 DIAGNOSIS — Z5111 Encounter for antineoplastic chemotherapy: Secondary | ICD-10-CM | POA: Diagnosis not present

## 2022-04-03 DIAGNOSIS — C50411 Malignant neoplasm of upper-outer quadrant of right female breast: Secondary | ICD-10-CM

## 2022-04-03 LAB — RAD ONC ARIA SESSION SUMMARY
Course Elapsed Days: 40
Plan Fractions Treated to Date: 14
Plan Fractions Treated to Date: 28
Plan Prescribed Dose Per Fraction: 1.8 Gy
Plan Prescribed Dose Per Fraction: 1.8 Gy
Plan Total Fractions Prescribed: 14
Plan Total Fractions Prescribed: 28
Plan Total Prescribed Dose: 25.2 Gy
Plan Total Prescribed Dose: 50.4 Gy
Reference Point Dosage Given to Date: 50.4 Gy
Reference Point Dosage Given to Date: 50.4 Gy
Reference Point Session Dosage Given: 1.8 Gy
Reference Point Session Dosage Given: 1.8 Gy
Session Number: 28

## 2022-04-04 ENCOUNTER — Other Ambulatory Visit: Payer: Self-pay

## 2022-04-04 ENCOUNTER — Ambulatory Visit
Admission: RE | Admit: 2022-04-04 | Discharge: 2022-04-04 | Disposition: A | Discharge status: Home / Self Care | Payer: 59 | Source: Ambulatory Visit | Attending: Radiation Oncology | Admitting: Radiation Oncology

## 2022-04-04 DIAGNOSIS — Z5111 Encounter for antineoplastic chemotherapy: Secondary | ICD-10-CM | POA: Diagnosis not present

## 2022-04-04 LAB — RAD ONC ARIA SESSION SUMMARY
Course Elapsed Days: 41
Plan Fractions Treated to Date: 1
Plan Prescribed Dose Per Fraction: 2 Gy
Plan Total Fractions Prescribed: 5
Plan Total Prescribed Dose: 10 Gy
Reference Point Dosage Given to Date: 52.4 Gy
Reference Point Session Dosage Given: 2 Gy
Session Number: 29

## 2022-04-04 NOTE — Assessment & Plan Note (Deleted)
Palpable lump in the right breast. Diagnostic mammogram and [place]: a 3.8 cm mass at the retroareolar right breast, 1 prominent right axillary lymph node with a 4 mm cortex, 1 other which is borderline measuring 3 mm. 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 3 o'clock position: Biopsy fibroadenoma  Treatment plan: 1. Neoadjuvant chemotherapy with TCH Perjeta 6 cycles followed by Herceptin Perjeta maintenance versus Kadcyla maintenance (based on response to neoadjuvant chemo) for 1 year 2.Bilateral Mastectomies: Left mastectomy: Benign Right mastectomy: Multifocal ILC 1.8 cm, 0.9 cm, margins negative, 1/2 lymph nodes positive, ER 50%, PR 30%, HER2 1+ IHC 3.Compass HER2 clinical trial: Randomization between Kadcyla versus Kadcyla plus Tucatinib 4.Followed by adjuvant radiation therapy 5.Followed by antiestrogen therapy  ------------------------------------------------------------------------------------------------------------------------ Current treatment: Kadcylacycle65mintenance (Compass HER2 clinical trial) + Tucatinib vs Placebo Echocardiogram March 2023: EF 55 to 60%  Hospitalization 01/22/2022-01/26/2022 abscess/cellulitis required removal of the implant The plan is now a delayed reconstruction  Adverse Effects: Tolerating it well 1. Mild diarrhea: subsided 2. Headaches: Resolved  Radiation: 02/23/22- 04/13/22 Return to clinic every 3 weeks for Kadcyla

## 2022-04-05 ENCOUNTER — Ambulatory Visit
Admission: RE | Admit: 2022-04-05 | Discharge: 2022-04-05 | Disposition: A | Discharge status: Home / Self Care | Payer: 59 | Source: Ambulatory Visit | Attending: Radiation Oncology | Admitting: Radiation Oncology

## 2022-04-05 ENCOUNTER — Other Ambulatory Visit: Payer: Self-pay

## 2022-04-05 ENCOUNTER — Inpatient Hospital Stay: Payer: 59 | Admitting: Hematology and Oncology

## 2022-04-05 DIAGNOSIS — C50411 Malignant neoplasm of upper-outer quadrant of right female breast: Secondary | ICD-10-CM

## 2022-04-05 DIAGNOSIS — Z5111 Encounter for antineoplastic chemotherapy: Secondary | ICD-10-CM | POA: Diagnosis not present

## 2022-04-05 LAB — RAD ONC ARIA SESSION SUMMARY
Course Elapsed Days: 42
Plan Fractions Treated to Date: 2
Plan Prescribed Dose Per Fraction: 2 Gy
Plan Total Fractions Prescribed: 5
Plan Total Prescribed Dose: 10 Gy
Reference Point Dosage Given to Date: 54.4 Gy
Reference Point Session Dosage Given: 2 Gy
Session Number: 30

## 2022-04-05 NOTE — Progress Notes (Signed)
Patient Care Team: Dian Queen, MD as PCP - General (Obstetrics and Gynecology) Dian Queen, MD (Obstetrics and Gynecology) Mauro Kaufmann, RN as Oncology Nurse Navigator Carlynn Spry, Charlott Holler, RN as Oncology Nurse Navigator Nicholas Lose, MD as Consulting Physician (Hematology and Oncology) Gery Pray, MD as Consulting Physician (Radiation Oncology) Rolm Bookbinder, MD as Consulting Physician (General Surgery)  DIAGNOSIS:  Encounter Diagnosis  Name Primary?   Malignant neoplasm of upper-outer quadrant of right breast in female, estrogen receptor positive ([place])     SUMMARY OF ONCOLOGIC HISTORY: Oncology History  Malignant neoplasm of upper-outer quadrant of right breast in female, estrogen receptor positive ([place])  06/23/2021 Initial Diagnosis   Palpable lump in the right breast. Diagnostic mammogram and [place]: a 3.8 cm mass at the retroareolar right breast, 1 prominent right axillary lymph node with a 4 mm cortex, 1 other which is borderline measuring 3 mm. 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  3 o'clock position: Biopsy fibroadenoma   07/12/2021 -  Neo-Adjuvant Chemotherapy   Neoadjuvant chemotherapy with TCH Perjeta 6 cycles followed by Herceptin Perjeta maintenance versus Kadcyla maintenance (based on response to neoadjuvant chemo) for 1 year   07/28/2021 Genetic Testing   Negative hereditary cancer genetic testing: no pathogenic variants detected in [facility] CancerNext-Expanded +RNAinsight Panel.  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  The report date is July 28, 2021.   The CancerNext-Expanded gene panel offered by [facility] and includes sequencing, rearrangement, and RNA analysis for the following 77 genes: AIP, ALK, APC, ATM, AXIN2, BAP1, BARD1, BLM, BMPR1A, BRCA1, BRCA2, BRIP1, CDC73, CDH1, CDK4, CDKN1B, CDKN2A, CHEK2, CTNNA1, DICER1, FANCC, FH,  FLCN, GALNT12, KIF1B, LZTR1, MAX, MEN1, MET, MLH1, MSH2, MSH3, MSH6, MUTYH, NBN, NF1, NF2, NTHL1, PALB2, PHOX2B, PMS2, POT1, PRKAR1A, PTCH1, PTEN, RAD51C, RAD51D, RB1, RECQL, RET, SDHA, SDHAF2, SDHB, SDHC, SDHD, SMAD4, SMARCA4, SMARCB1, SMARCE1, STK11, SUFU, TMEM127, TP53, TSC1, TSC2, VHL and XRCC2 (sequencing and deletion/duplication); EGFR, EGLN1, HOXB13, KIT, MITF, PDGFRA, POLD1, and POLE (sequencing only); EPCAM and GREM1 (deletion/duplication only).    12/06/2021 Surgery   Bilateral Mastectomies: Left mastectomy: Benign Right mastectomy: Multifocal ILC 1.8 cm, 0.9 cm, margins negative, 1/2 lymph nodes positive, ER 50%, PR 30%, HER2 1+ IHC   12/26/2021 - 12/29/2021 Chemotherapy   Patient is on Treatment Plan : BREAST ADO-Trastuzumab Emtansine (Kadcyla) q21d       CHIEF COMPLIANT: Follow-up of right breast cancer on Kadcyla.  cycle 5  INTERVAL HISTORY: Jenna Cross is a 36 y.o. with above-mentioned history of right breast cancer, currently on chemotherapy with TCHP. She presents to the clinic today for follow-up. States that she had some mild nausea that lasted 2-3 days. Denies headaches. States they have improved. Denies neuropathy. She had got some fluctuation on her forehead and her scalp after treatment. She didn't use anything for relief. She states that radiation was the hardest for her caused severe fatigue. She is starting to worry about things a lot more. Having more anxiety. She was concern if she should get a colonoscopy. She had some concerns about the staging and what does it means. She was wanting the full understanding of her conditions.   ALLERGIES:  is allergic to wound dressings.  MEDICATIONS:  Current Outpatient Medications  Medication Sig Dispense Refill   acetaminophen (TYLENOL) 500 MG tablet Take 1,000 mg by mouth every 6 (six) hours as needed for mild pain or headache.     ALPRAZolam (XANAX) 0.5  MG tablet Take 0.5 mg by mouth 3 (three) times daily as needed for  anxiety.     diphenoxylate-atropine (LOMOTIL) 2.5-0.025 MG tablet Take 1 tablet by mouth 3 (three) times daily as needed for diarrhea or loose stools. 30 tablet 3   lidocaine-prilocaine (EMLA) cream lidocaine-prilocaine 2.5 %-2.5 % topical cream  APPLY TO AFFECTED AREA ONCE AS DIRECTED     loratadine (CLARITIN) 10 MG tablet Take 10 mg by mouth daily as needed for allergies.     ondansetron (ZOFRAN) 8 MG tablet Take 1 tablet (8 mg total) by mouth every 8 (eight) hours as needed for nausea. 30 tablet 3   predniSONE (STERAPRED UNI-PAK 21 TAB) 10 MG (21) TBPK tablet 6 day taper; take as directed on package instructions (Patient not taking: Reported on 04/19/2022) 21 tablet 0   rivaroxaban (XARELTO) 20 MG TABS tablet Take 1 tablet (20 mg total) by mouth daily with supper. (Patient taking differently: Take 20 mg by mouth daily.) 30 tablet 5   valACYclovir (VALTREX) 500 MG tablet Take 500 mg by mouth every evening.     venlafaxine XR (EFFEXOR-XR) 37.5 MG 24 hr capsule Take 1 capsule (37.5 mg total) by mouth daily with breakfast. 30 capsule 0   hydrocortisone cream 0.5 % Apply 1 Application topically as needed for itching.     Investigational tucatanib/placebo 150 MG tablet (ONT-380,NSC #[id number]) [employer]ANCE [id number] Take 2 tablets (300 mg total) 2 (two) times dailyfor 21 days.  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 Tucatinib or placebo should take orally twice daily with or without food, approximately 12 hours apart between doses. Tablets must be swallowed whole and may not be crushed, chewed or dissolved in liquid. CAUTION: Chemotherapy/Biotherapy 84 tablet 0   No current facility-administered medications for this visit.    PHYSICAL EXAMINATION: ECOG PERFORMANCE STATUS: 1 - Symptomatic but completely ambulatory  Vitals:   04/19/22 0854  BP: (!) 113/55  Pulse: 84  Resp: 18  Temp: (!) 97.5 F (36.4 C)  SpO2: 97%   Filed Weights   04/19/22 0854  Weight: 132 lb 12.8 oz (60.2 kg)      LABORATORY DATA:  I have  reviewed the data as listed    Latest Ref Rng & Units 04/19/2022    8:47 AM 04/02/2022    9:45 AM 03/29/2022    8:32 AM  CMP  Glucose 70 - 99 mg/dL 90  114  92   BUN 6 - 20 mg/dL 15  8  12    Creatinine 0.44 - 1.00 mg/dL 0.98  0.79  0.91   Sodium 135 - 145 mmol/L 140  139  140   Potassium 3.5 - 5.1 mmol/L 3.7  3.4  3.9   Chloride 98 - 111 mmol/L 106  105  107   CO2 22 - 32 mmol/L 28  28  27    Calcium 8.9 - 10.3 mg/dL 9.5  9.6  9.4   Total Protein 6.5 - 8.1 g/dL 6.9  7.0  6.9   Total Bilirubin 0.3 - 1.2 mg/dL 0.9  0.4  0.5   Alkaline Phos 38 - 126 U/L 98  146  90   AST 15 - 41 U/L 20  40  19   ALT 0 - 44 U/L 21  27  19      Lab Results  Component Value Date   WBC 4.3 04/19/2022   HGB 12.8 04/19/2022   HCT 37.3 04/19/2022   MCV 92.6 04/19/2022   PLT 206 04/19/2022   NEUTROABS 2.5 04/19/2022  ASSESSMENT & PLAN:  Malignant neoplasm of upper-outer quadrant of right breast in female, estrogen receptor positive ([facility]) Palpable lump in the right breast. Diagnostic mammogram and [place]: a 3.8 cm mass at the retroareolar right breast, 1 prominent right axillary lymph node with a 4 mm cortex, 1 other which is borderline measuring 3 mm. 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  3 o'clock position: Biopsy fibroadenoma   Treatment plan: 1. Neoadjuvant chemotherapy with TCH Perjeta 6 cycles followed by Herceptin Perjeta maintenance versus Kadcyla maintenance (based on response to neoadjuvant chemo) for 1 year 2. Bilateral Mastectomies: Left mastectomy: Benign Right mastectomy: Multifocal ILC 1.8 cm, 0.9 cm, margins negative, 1/2 lymph nodes positive, ER 50%, PR 30%, HER2 1+ IHC 3.  [facility] HER2 clinical trial: Randomization between Kadcyla versus Kadcyla plus Tucatinib 4. Followed by adjuvant radiation therapy  5.  Followed by antiestrogen therapy    ------------------------------------------------------------------------------------------------------------------------ Current treatment: Kadcyla cycle 5 maintenance ([facility]ass HER2 clinical trial) + Tucatinib vs Placebo Echocardiogram March 2023: EF 55 to 60%   Hospitalization 01/22/2022-01/26/2022 abscess/cellulitis required removal of the implant The plan is now a delayed reconstruction   Adverse Effects: Tolerating it well 1. Mild diarrhea: subsided 2. Headaches: Resolved   Radiation: 02/23/22- 04/13/22 Return to clinic every 3 weeks for Kadcyla    No orders of the defined types were placed in this encounter.  The patient has a good understanding of the overall plan. she agrees with it. she will call with any problems that may develop before the next visit here. Total time spent: 30 mins including face to face time and time spent for planning, charting and co-ordination of care   Harriette Ohara, MD 04/20/22    I Gardiner Coins am scribing for Dr. Lindi Adie  I have reviewed the above documentation for accuracy and completeness, and I agree with the above.

## 2022-04-05 NOTE — Research (Signed)
CompassHER2 Residual Disease, A Double-Blinded, Phase III Randomized Trial of T-DM1 and Placebo Compared with T-DMI and Tucatinib  Connected with patient via phone this morning to follow-up after her [facility] visit for her swollen lymph node. Patient stated she received a call from Dr Lindi Adie last night and was feeling better; would continue course of antibiotics and return to clinic for treatments as scheduled.   ADVERSE EVENTS: Patient reported swollen lymph node that began over the weekend, around 6/24. She noted intermittent "cold" symptoms over the last month, increasing over the weekend. Patient was diagnosed with an infection (grade 2) and prescribed oral antibiotics. See [facility] note dated 04/02/22. Dr Lindi Adie determined that this was unrelated to her treatment with tucatinib/placebo and T-DM1.  Per protocol, dose reductions are not required for grade 1 or 2 adverse events that are not related to hepatic dysfunction, left ventricular dysfunction, thrombocytopenia, nodular regenerative hyperplasia, or pulmonary fibrosis.  ADVERSE EVENT LOG: Study/Protocol: [facility] [id number] CompassHER2 Cycle 3  Event Grade CTCAE v5.0 Onset Date Resolved Date Attribution to protocol therapy? Treatment Comments  Infections and infestations; other 2 03/31/22 Ongoing, improving Unrelated Augmentin Patient reported swollen, painful lymph node behind left ear. Intermittent "cold" symptoms (watery eyes, mild congestion).    PLAN: Patient has her final radiation appointments next week (ending 04/13/22) and will return to clinic for [place] on 04/19/22. Patient thanked for her time and continued voluntary participation in this study. Patient has been provided direct contact information and is encouraged to contact this nurse for any questions or concerns  Vickii Penna, RN, BSN, CPN Clinical Research Nurse I [phone]  04/05/2022 8:36 AM

## 2022-04-06 ENCOUNTER — Ambulatory Visit: Payer: 59

## 2022-04-09 ENCOUNTER — Ambulatory Visit: Payer: 59

## 2022-04-10 ENCOUNTER — Ambulatory Visit: Payer: 59

## 2022-04-11 ENCOUNTER — Ambulatory Visit: Payer: 59

## 2022-04-11 ENCOUNTER — Other Ambulatory Visit: Payer: Self-pay

## 2022-04-11 ENCOUNTER — Inpatient Hospital Stay: Payer: 59 | Admitting: Physician Assistant

## 2022-04-11 ENCOUNTER — Encounter: Payer: Self-pay | Admitting: *Deleted

## 2022-04-11 ENCOUNTER — Ambulatory Visit
Admission: RE | Admit: 2022-04-11 | Discharge: 2022-04-11 | Disposition: A | Discharge status: Home / Self Care | Payer: 59 | Source: Ambulatory Visit | Attending: Radiation Oncology | Admitting: Radiation Oncology

## 2022-04-11 DIAGNOSIS — Z510 Encounter for antineoplastic radiation therapy: Secondary | ICD-10-CM | POA: Insufficient documentation

## 2022-04-11 DIAGNOSIS — C50411 Malignant neoplasm of upper-outer quadrant of right female breast: Secondary | ICD-10-CM | POA: Insufficient documentation

## 2022-04-11 LAB — RAD ONC ARIA SESSION SUMMARY
Course Elapsed Days: 48
Plan Fractions Treated to Date: 3
Plan Prescribed Dose Per Fraction: 2 Gy
Plan Total Fractions Prescribed: 5
Plan Total Prescribed Dose: 10 Gy
Reference Point Dosage Given to Date: 56.4 Gy
Reference Point Session Dosage Given: 2 Gy
Session Number: 31

## 2022-04-12 ENCOUNTER — Other Ambulatory Visit: Payer: Self-pay

## 2022-04-12 ENCOUNTER — Ambulatory Visit
Admission: RE | Admit: 2022-04-12 | Discharge: 2022-04-12 | Disposition: A | Discharge status: Home / Self Care | Payer: 59 | Source: Ambulatory Visit | Attending: Radiation Oncology | Admitting: Radiation Oncology

## 2022-04-12 ENCOUNTER — Ambulatory Visit: Payer: 59

## 2022-04-12 DIAGNOSIS — Z510 Encounter for antineoplastic radiation therapy: Secondary | ICD-10-CM | POA: Diagnosis not present

## 2022-04-12 LAB — RAD ONC ARIA SESSION SUMMARY
Course Elapsed Days: 49
Plan Fractions Treated to Date: 4
Plan Prescribed Dose Per Fraction: 2 Gy
Plan Total Fractions Prescribed: 5
Plan Total Prescribed Dose: 10 Gy
Reference Point Dosage Given to Date: 58.4 Gy
Reference Point Session Dosage Given: 2 Gy
Session Number: 32

## 2022-04-13 ENCOUNTER — Other Ambulatory Visit: Payer: Self-pay

## 2022-04-13 ENCOUNTER — Encounter: Payer: Self-pay | Admitting: Radiation Oncology

## 2022-04-13 ENCOUNTER — Ambulatory Visit
Admission: RE | Admit: 2022-04-13 | Discharge: 2022-04-13 | Disposition: A | Discharge status: Home / Self Care | Payer: 59 | Source: Ambulatory Visit | Attending: Radiation Oncology | Admitting: Radiation Oncology

## 2022-04-13 DIAGNOSIS — Z510 Encounter for antineoplastic radiation therapy: Secondary | ICD-10-CM | POA: Diagnosis not present

## 2022-04-13 LAB — RAD ONC ARIA SESSION SUMMARY
Course Elapsed Days: 50
Plan Fractions Treated to Date: 5
Plan Prescribed Dose Per Fraction: 2 Gy
Plan Total Fractions Prescribed: 5
Plan Total Prescribed Dose: 10 Gy
Reference Point Dosage Given to Date: 60.4 Gy
Reference Point Session Dosage Given: 2 Gy
Session Number: 33

## 2022-04-18 NOTE — Assessment & Plan Note (Signed)
Palpable lump in the right breast. Diagnostic mammogram and [place]: a 3.8 cm mass at the retroareolar right breast, 1 prominent right axillary lymph node with a 4 mm cortex, 1 other which is borderline measuring 3 mm. 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 3 o'clock position: Biopsy fibroadenoma  Treatment plan: 1. Neoadjuvant chemotherapy with TCH Perjeta 6 cycles followed by Herceptin Perjeta maintenance versus Kadcyla maintenance (based on response to neoadjuvant chemo) for 1 year 2.Bilateral Mastectomies: Left mastectomy: Benign Right mastectomy: Multifocal ILC 1.8 cm, 0.9 cm, margins negative, 1/2 lymph nodes positive, ER 50%, PR 30%, HER2 1+ IHC 3.Compass HER2 clinical trial: Randomization between Kadcyla versus Kadcyla plus Tucatinib 4.Followed by adjuvant radiation therapy 5.Followed by antiestrogen therapy  ------------------------------------------------------------------------------------------------------------------------ Current treatment: Kadcylacycle18mintenance (Compass HER2 clinical trial) + Tucatinib vs Placebo Echocardiogram March 2023: EF 55 to 60%  Hospitalization 01/22/2022-01/26/2022 abscess/cellulitis required removal of the implant The plan is now a delayed reconstruction  Adverse Effects: Tolerating it well 1. Mild diarrhea: subsided 2. Headaches: Resolved  Radiation: 02/23/22- 04/13/22 Return to clinic every 3 weeks for Kadcyla

## 2022-04-19 ENCOUNTER — Ambulatory Visit: Payer: 59

## 2022-04-19 ENCOUNTER — Inpatient Hospital Stay: Payer: 59 | Attending: Hematology and Oncology | Admitting: Hematology and Oncology

## 2022-04-19 ENCOUNTER — Inpatient Hospital Stay: Payer: 59

## 2022-04-19 ENCOUNTER — Other Ambulatory Visit: Payer: Self-pay

## 2022-04-19 DIAGNOSIS — Z170 Estrogen receptor positive status [ER+]: Secondary | ICD-10-CM

## 2022-04-19 DIAGNOSIS — Z79899 Other long term (current) drug therapy: Secondary | ICD-10-CM | POA: Diagnosis not present

## 2022-04-19 DIAGNOSIS — Z006 Encounter for examination for normal comparison and control in clinical research program: Secondary | ICD-10-CM | POA: Diagnosis not present

## 2022-04-19 DIAGNOSIS — C50411 Malignant neoplasm of upper-outer quadrant of right female breast: Secondary | ICD-10-CM | POA: Diagnosis not present

## 2022-04-19 DIAGNOSIS — Z5112 Encounter for antineoplastic immunotherapy: Secondary | ICD-10-CM | POA: Diagnosis present

## 2022-04-19 DIAGNOSIS — Z95828 Presence of other vascular implants and grafts: Secondary | ICD-10-CM

## 2022-04-19 LAB — CBC WITH DIFFERENTIAL (CANCER CENTER ONLY)
Abs Immature Granulocytes: 0.01 10*3/uL (ref 0.00–0.07)
Basophils Absolute: 0 10*3/uL (ref 0.0–0.1)
Basophils Relative: 1 %
Eosinophils Absolute: 0.3 10*3/uL (ref 0.0–0.5)
Eosinophils Relative: 8 %
HCT: 37.3 % (ref 36.0–46.0)
Hemoglobin: 12.8 g/dL (ref 12.0–15.0)
Immature Granulocytes: 0 %
Lymphocytes Relative: 24 %
Lymphs Abs: 1 10*3/uL (ref 0.7–4.0)
MCH: 31.8 pg (ref 26.0–34.0)
MCHC: 34.3 g/dL (ref 30.0–36.0)
MCV: 92.6 fL (ref 80.0–100.0)
Monocytes Absolute: 0.4 10*3/uL (ref 0.1–1.0)
Monocytes Relative: 9 %
Neutro Abs: 2.5 10*3/uL (ref 1.7–7.7)
Neutrophils Relative %: 58 %
Platelet Count: 206 10*3/uL (ref 150–400)
RBC: 4.03 MIL/uL (ref 3.87–5.11)
RDW: 13.4 % (ref 11.5–15.5)
WBC Count: 4.3 10*3/uL (ref 4.0–10.5)
nRBC: 0 % (ref 0.0–0.2)

## 2022-04-19 LAB — CMP (CANCER CENTER ONLY)
ALT: 21 U/L (ref 0–44)
AST: 20 U/L (ref 15–41)
Albumin: 4.1 g/dL (ref 3.5–5.0)
Alkaline Phosphatase: 98 U/L (ref 38–126)
Anion gap: 6 (ref 5–15)
BUN: 15 mg/dL (ref 6–20)
CO2: 28 mmol/L (ref 22–32)
Calcium: 9.5 mg/dL (ref 8.9–10.3)
Chloride: 106 mmol/L (ref 98–111)
Creatinine: 0.98 mg/dL (ref 0.44–1.00)
GFR, Estimated: >60 mL/min (ref >60)
Glucose, Bld: 90 mg/dL (ref 70–99)
Potassium: 3.7 mmol/L (ref 3.5–5.1)
Sodium: 140 mmol/L (ref 135–145)
Total Bilirubin: 0.9 mg/dL (ref 0.3–1.2)
Total Protein: 6.9 g/dL (ref 6.5–8.1)

## 2022-04-19 LAB — PREGNANCY, URINE: Preg Test, Ur: NEGATIVE

## 2022-04-19 MED ORDER — DIPHENHYDRAMINE HCL 25 MG PO CAPS
50.0000 mg | ORAL_CAPSULE | Freq: Once | ORAL | Status: AC
  Administered 2022-04-19: 50 mg via ORAL
  Filled 2022-04-19: qty 2

## 2022-04-19 MED ORDER — SODIUM CHLORIDE 0.9% FLUSH
10.0000 mL | INTRAVENOUS | Status: DC | PRN
  Administered 2022-04-19: 10 mL

## 2022-04-19 MED ORDER — ACETAMINOPHEN 325 MG PO TABS
650.0000 mg | ORAL_TABLET | Freq: Once | ORAL | Status: AC
  Administered 2022-04-19: 650 mg via ORAL
  Filled 2022-04-19: qty 2

## 2022-04-19 MED ORDER — SODIUM CHLORIDE 0.9 % IV SOLN
3.6000 mg/kg | Freq: Once | INTRAVENOUS | Status: AC
  Administered 2022-04-19: 220 mg via INTRAVENOUS
  Filled 2022-04-19: qty 8

## 2022-04-19 MED ORDER — SODIUM CHLORIDE 0.9% FLUSH
10.0000 mL | Freq: Once | INTRAVENOUS | Status: AC
  Administered 2022-04-19: 10 mL

## 2022-04-19 MED ORDER — HEPARIN SOD (PORK) LOCK FLUSH 100 UNIT/ML IV SOLN
500.0000 [IU] | Freq: Once | INTRAVENOUS | Status: AC | PRN
  Administered 2022-04-19: 500 [IU]

## 2022-04-19 MED ORDER — SODIUM CHLORIDE 0.9 % IV SOLN: Freq: Once | INTRAVENOUS | Status: AC

## 2022-04-19 MED ORDER — INV-TUCATINIB/PLACEBO 150 MG TABLET ALLIANCE A011801: 300.0000 mg | ORAL_TABLET | Freq: Two times a day (BID) | ORAL | 0 refills | Status: AC

## 2022-04-19 NOTE — Research (Signed)
CompassHER2 Residual Disease, A Double-Blinded, Phase III Randomized Trial of T-DM1 and Placebo Compared with T-DMI and Tucatinib  Patient presented to clinic unaccompanied this morning for cycle 4 day 1.  RAD-ONC: Patient completed radiation treatments on 04/13/22.  LABS: CBC and CMP collected via port-a-cath per consent and study protocol. Patient tolerated well without complaint.  VITAL SIGNS: Completed, including weight. See vital signs flowsheet.   H&P: Completed by Dr Lindi Adie. See clinic note dated 04/19/22. Provider reviewed lab results and cleared patient to continue treatment. Of note, patient had several labs out of range when she presented to [facility] on C3D5 (WBC decreased, lymphocytes decreased, platelets decreased, hypokalemia, and alkaline phosphatase increased). All abnormalities resolved on C4D1 labs. See MD attributions below.   RESEARCH: Concomitant medications reviewed. Medication list updated and verified with patient.   Solicited and other adverse events assessed per protocol. Patient reported that she again had mild nausea (grade 1) for three days following her T-DM1 infusion. Patient reported that several of her ongoing AEs had resolved; patient reported that her infection (grade 2) with swollen lymph node resolved with prescribed antibiotics. Patient also reported resolution of fatigue (grade 2) since finishing radiation and a return to her baseline (grade 1) level. Patient reported resolution of moderate intermittent headache (grade 2). In addition, patient reported resolution of her mild burn (grade 1) on her chest where the skin was exposed to radiation treatments. Patient also reported several new AEs. Patient noted intermittent abdominal bloating (grade 1) and a "trapped gas" feeling, but stated that it was not affecting her bowel habits or oral intake. She also reported mild folliculitis on her scalp following her most recent T-DM1 infusion. Dr Lindi Adie advised  patient to use over-the-counter steroid cream for relief. See table below.  Patient returned remaining cycle 3 tucatinib/placebo and medication diary. She returned two bottles, one with 10 tablets remaining and one unopened bottle of 50 tablets (10 + 50 = 60; 100 total tablets dispensed - 60 remaining = 40 tablets taken). Medication diary reflects the same: 40 tablets taken; 20 total doses. Remaining tucatinib/placebo returned to pharmacy. Pill count verified with Raul Del, PharmD.   Cycle 4 tucatinib/placebo dispensed by pharmacy. Research RN verified that drug had correct patient name and study ID number. Drug was dispensed in two bottles of 50 '150mg'$  tablets, lot #[id number] (bottle 1: 11703, bottle 2: 11859), exp 05/2023. Patient instructed to continue taking two tablets ('300mg'$  total) orally twice daily and to continue recording doses and side effects on medication diary. Reinforced importance of compliance with study medication. Patient verbalized understanding. Patient was given cycle 4 medication diary.   INFUSION: Patient received clinic standard pre-medications for T-DM1 treatment -- '650mg'$  acetaminophen PO and '50mg'$  diphenhydramine PO. PKs not required this cycle per protocol. Patient tolerated infusion well without complaint. Patient took morning dose of tucatinib/placebo at 1020 from cycle 4 supply.   ADVERSE EVENT LOG: Study/Protocol: [facility] [id number] CompassHER2 Cycle 4   Baseline: anxiety (grade 2), fatigue (grade 1), herpes simplex virus, hypotension (grade 1), vascular access complication (grade 3). Not reportable (unrelated/unlikely to be related to study treatment and < grade 3): burn (grade 1), infections + infestations (grade 2), alkaline phosphatase increased (grade 1), folliculitis (grade 1).   Event (solicited vs other reportable) Grade CTCAE v5.0 Onset Date Resolved Date Attribution to Study Treatment Treatment Comments  Neutrophil count decreased 0 N/A N/A N/A N/A   Platelet  count decreased 1 04/02/22 04/19/22 Probable N/A Noted when patient presented to [facility]  on C3D5. Resolved on C4D1 labs.  Rash (maculo-papular) 0 N/A N/A N/A N/A   Epistaxis 0 N/A N/A N/A N/A    Diarrhea 0 N/A N/A N/A N/A Patient reported a baseline of 2-3 stools/day.   Nausea 1 03/30/22 04/01/22 Possible N/A Patient reported mild nausea x3 days after T-DM1 infusion.  Vomiting 0 N/A N/A N/A N/A   Bilirubin increased 0 N/A N/A N/A N/A    AST increased 0 N/A N/A N/A N/A    ALT increased 0 N/A N/A N/A N/A    Anemia 0 N/A N/A N/A N/A    Mucositis oral 0 N/A N/A N/A N/A    Palmar-plantar erythrodysesthesia 0 N/A N/A N/A N/A    Peripheral neuropathy -- motor 0 N/A N/A N/A N/A    Peripheral neuropathy -- sensory 0 N/A N/A N/A N/A    Fatigue 2 02/16/22 04/19/22 Probable N/A Patient reported return to baseline (grade 1) after finishing radiation.  Headache 2 02/16/22 04/19/22 Possible Tylenol PRN Patient reported intermittent symptoms, becoming less and less frequent, now resolved.  Bloating 1 04/06/22 Ongoing Probable N/A Patient reported intermittent abdominal bloating + "trapped gas" feeling. No changes in bowel habits or oral intake.  WBC decreased 1 04/02/22 04/19/22 Probable N/A Noted when patient presented to [facility] on C3D5. Resolved on C4D1 labs.  Lymphocytes decreased 1 04/02/22 04/19/22 Possible N/A Noted when patient presented to [facility] on C3D5. Resolved on C4D1 labs.  Hypokalemia 1 04/02/22 04/19/22 Possible N/A Noted when patient presented to [facility] on C3D5. Resolved on C4D1 labs.   PLAN: Next treatment visit is 05/09/22 (one day early per patient request; within +/- 3 day window per protocol). Visit will include labs, a provider visit, and an infusion appointment. Patient reminded to bring remaining tucatinib/placebo tablets and cycle 4 medication diary to C5D1 appointment. Patient verbalized understanding. Research will meet with patient and accompany her to appointments on C5D1. Patient reminded to continue to fill  out ePROs as they become available. Patient thanked for her time and continued voluntary participation in this study. Patient has been provided direct contact information and is encouraged to contact this nurse for any questions or concerns.  Vickii Penna, RN, BSN, CPN Clinical Research Nurse I [phone]  04/23/2022 8:44 AM

## 2022-04-19 NOTE — Patient Instructions (Signed)
[facility] ONCOLOGY  Discharge Instructions: Thank you for choosing [facility] to provide your oncology and hematology care.   If you have a lab appointment with the [facility], please go directly to the [facility] and check in at the registration area.   Wear comfortable clothing and clothing appropriate for easy access to any Portacath or PICC line.   We strive to give you quality time with your provider. You may need to reschedule your appointment if you arrive late (15 or more minutes).  Arriving late affects you and other patients whose appointments are after yours.  Also, if you miss three or more appointments without notifying the office, you may be dismissed from the clinic at the provider's discretion.      For prescription refill requests, have your pharmacy contact our office and allow 72 hours for refills to be completed.    Today you received the following chemotherapy and/or immunotherapy agent: Kadcyla      To help prevent nausea and vomiting after your treatment, we encourage you to take your nausea medication as directed.  BELOW ARE SYMPTOMS THAT SHOULD BE REPORTED IMMEDIATELY: *FEVER GREATER THAN 100.4 F (38 C) OR HIGHER *CHILLS OR SWEATING *NAUSEA AND VOMITING THAT IS NOT CONTROLLED WITH YOUR NAUSEA MEDICATION *UNUSUAL SHORTNESS OF BREATH *UNUSUAL BRUISING OR BLEEDING *URINARY PROBLEMS (pain or burning when urinating, or frequent urination) *BOWEL PROBLEMS (unusual diarrhea, constipation, pain near the anus) TENDERNESS IN MOUTH AND THROAT WITH OR WITHOUT PRESENCE OF ULCERS (sore throat, sores in mouth, or a toothache) UNUSUAL RASH, SWELLING OR PAIN  UNUSUAL VAGINAL DISCHARGE OR ITCHING   Items with * indicate a potential emergency and should be followed up as soon as possible or go to the Emergency Department if any problems should occur.  Please show the CHEMOTHERAPY ALERT CARD or IMMUNOTHERAPY ALERT CARD at check-in to the  Emergency Department and triage nurse.  Should you have questions after your visit or need to cancel or reschedule your appointment, please contact [facility]  Dept: [phone]  and follow the prompts.  Office hours are 8:00 a.m. to 4:30 p.m. Monday - Friday. Please note that voicemails left after 4:00 p.m. may not be returned until the following business day.  We are closed weekends and major holidays. You have access to a nurse at all times for urgent questions. Please call the main number to the clinic Dept: [phone] and follow the prompts.   For any non-urgent questions, you may also contact your provider using MyChart. We now offer e-Visits for anyone 63 and older to request care online for non-urgent symptoms. For details visit [email or web address].   Also download the MyChart app! Go to the app store, search "MyChart", open the app, select [facility], and log in with your MyChart username and password.  Masks are optional in the cancer centers. If you would like for your care team to wear a mask while they are taking care of you, please let them know. For doctor visits, patients may have with them one support person who is at least 37 years old. At this time, visitors are not allowed in the infusion area.

## 2022-04-20 ENCOUNTER — Encounter: Payer: Self-pay | Admitting: Hematology and Oncology

## 2022-04-23 ENCOUNTER — Encounter: Payer: Self-pay | Admitting: Hematology and Oncology

## 2022-04-23 ENCOUNTER — Other Ambulatory Visit: Payer: Self-pay | Admitting: Hematology and Oncology

## 2022-05-04 NOTE — Progress Notes (Incomplete)
Patient Care Team: Dian Queen, MD as PCP - General (Obstetrics and Gynecology) Dian Queen, MD (Obstetrics and Gynecology) Mauro Kaufmann, RN as Oncology Nurse Navigator Rockwell Germany, RN as Oncology Nurse Navigator Nicholas Lose, MD as Consulting Physician (Hematology and Oncology) Gery Pray, MD as Consulting Physician (Radiation Oncology) Rolm Bookbinder, MD as Consulting Physician (General Surgery)  DIAGNOSIS: No diagnosis found.  SUMMARY OF ONCOLOGIC HISTORY: Oncology History  Malignant neoplasm of upper-outer quadrant of right breast in female, estrogen receptor positive ([place])  06/23/2021 Initial Diagnosis   Palpable lump in the right breast. Diagnostic mammogram and [place]: a 3.8 cm mass at the retroareolar right breast, 1 prominent right axillary lymph node with a 4 mm cortex, 1 other which is borderline measuring 3 mm. 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  3 o'clock position: Biopsy fibroadenoma   07/12/2021 -  Neo-Adjuvant Chemotherapy   Neoadjuvant chemotherapy with TCH Perjeta 6 cycles followed by Herceptin Perjeta maintenance versus Kadcyla maintenance (based on response to neoadjuvant chemo) for 1 year   07/28/2021 Genetic Testing   Negative hereditary cancer genetic testing: no pathogenic variants detected in [facility] CancerNext-Expanded +RNAinsight Panel.  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  The report date is July 28, 2021.   The CancerNext-Expanded gene panel offered by [facility] and includes sequencing, rearrangement, and RNA analysis for the following 77 genes: AIP, ALK, APC, ATM, AXIN2, BAP1, BARD1, BLM, BMPR1A, BRCA1, BRCA2, BRIP1, CDC73, CDH1, CDK4, CDKN1B, CDKN2A, CHEK2, CTNNA1, DICER1, FANCC, FH, FLCN, GALNT12, KIF1B, LZTR1, MAX, MEN1, MET, MLH1, MSH2, MSH3, MSH6, MUTYH, NBN, NF1, NF2, NTHL1, PALB2, PHOX2B, PMS2, POT1,  PRKAR1A, PTCH1, PTEN, RAD51C, RAD51D, RB1, RECQL, RET, SDHA, SDHAF2, SDHB, SDHC, SDHD, SMAD4, SMARCA4, SMARCB1, SMARCE1, STK11, SUFU, TMEM127, TP53, TSC1, TSC2, VHL and XRCC2 (sequencing and deletion/duplication); EGFR, EGLN1, HOXB13, KIT, MITF, PDGFRA, POLD1, and POLE (sequencing only); EPCAM and GREM1 (deletion/duplication only).    12/06/2021 Surgery   Bilateral Mastectomies: Left mastectomy: Benign Right mastectomy: Multifocal ILC 1.8 cm, 0.9 cm, margins negative, 1/2 lymph nodes positive, ER 50%, PR 30%, HER2 1+ IHC   12/26/2021 - 12/29/2021 Chemotherapy   Patient is on Treatment Plan : BREAST ADO-Trastuzumab Emtansine (Kadcyla) q21d       CHIEF COMPLIANT:   INTERVAL HISTORY: Jenna Cross is a   ALLERGIES:  is allergic to wound dressings.  MEDICATIONS:  Current Outpatient Medications  Medication Sig Dispense Refill   acetaminophen (TYLENOL) 500 MG tablet Take 1,000 mg by mouth every 6 (six) hours as needed for mild pain or headache.     ALPRAZolam (XANAX) 0.5 MG tablet Take 0.5 mg by mouth 3 (three) times daily as needed for anxiety.     diphenoxylate-atropine (LOMOTIL) 2.5-0.025 MG tablet Take 1 tablet by mouth 3 (three) times daily as needed for diarrhea or loose stools. 30 tablet 3   hydrocortisone cream 0.5 % Apply 1 Application topically as needed for itching.     Investigational tucatanib/placebo 150 MG tablet (ONT-380,NSC #[id number]) [facility] [id number] Take 2 tablets (300 mg total) 2 (two) times dailyfor 21 days.  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 Tucatinib or placebo should take orally twice daily with or without food, approximately 12 hours apart between doses. Tablets must be swallowed whole and may not be crushed, chewed or dissolved in liquid. CAUTION: Chemotherapy/Biotherapy 84 tablet 0   lidocaine-prilocaine (EMLA) cream lidocaine-prilocaine 2.5 %-2.5 % topical cream  APPLY TO AFFECTED AREA ONCE AS DIRECTED  loratadine (CLARITIN) 10 MG tablet Take 10 mg by mouth daily as needed  for allergies.     ondansetron (ZOFRAN) 8 MG tablet Take 1 tablet (8 mg total) by mouth every 8 (eight) hours as needed for nausea. 30 tablet 3   predniSONE (STERAPRED UNI-PAK 21 TAB) 10 MG (21) TBPK tablet 6 day taper; take as directed on package instructions (Patient not taking: Reported on 04/19/2022) 21 tablet 0   rivaroxaban (XARELTO) 20 MG TABS tablet Take 1 tablet (20 mg total) by mouth daily with supper. (Patient taking differently: Take 20 mg by mouth daily.) 30 tablet 5   valACYclovir (VALTREX) 500 MG tablet Take 500 mg by mouth every evening.     venlafaxine XR (EFFEXOR-XR) 37.5 MG 24 hr capsule TAKE 1 CAPSULE BY MOUTH DAILY WITH BREAKFAST. 90 capsule 1   No current facility-administered medications for this visit.    PHYSICAL EXAMINATION: ECOG PERFORMANCE STATUS: {CHL ONC ECOG PS:[phone]}  There were no vitals filed for this visit. There were no vitals filed for this visit.  BREAST:*** No palpable masses or nodules in either right or left breasts. No palpable axillary supraclavicular or infraclavicular adenopathy no breast tenderness or nipple discharge. (exam performed in the presence of a chaperone)  LABORATORY DATA:  I have reviewed the data as listed    Latest Ref Rng & Units 04/19/2022    8:47 AM 04/02/2022    9:45 AM 03/29/2022    8:32 AM  CMP  Glucose 70 - 99 mg/dL 90  114  92   BUN 6 - 20 mg/dL 15  8  12    Creatinine 0.44 - 1.00 mg/dL 0.98  0.79  0.91   Sodium 135 - 145 mmol/L 140  139  140   Potassium 3.5 - 5.1 mmol/L 3.7  3.4  3.9   Chloride 98 - 111 mmol/L 106  105  107   CO2 22 - 32 mmol/L 28  28  27    Calcium 8.9 - 10.3 mg/dL 9.5  9.6  9.4   Total Protein 6.5 - 8.1 g/dL 6.9  7.0  6.9   Total Bilirubin 0.3 - 1.2 mg/dL 0.9  0.4  0.5   Alkaline Phos 38 - 126 U/L 98  146  90   AST 15 - 41 U/L 20  40  19   ALT 0 - 44 U/L 21  27  19      Lab Results  Component Value Date   WBC 4.3 04/19/2022   HGB 12.8 04/19/2022   HCT 37.3 04/19/2022   MCV 92.6  04/19/2022   PLT 206 04/19/2022   NEUTROABS 2.5 04/19/2022    ASSESSMENT & PLAN:  No problem-specific Assessment & Plan notes found for this encounter.    No orders of the defined types were placed in this encounter.  The patient has a good understanding of the overall plan. she agrees with it. she will call with any problems that may develop before the next visit here. Total time spent: 30 mins including face to face time and time spent for planning, charting and co-ordination of care   Suzzette Righter, [place] 05/04/22    I Gardiner Coins am scribing for Dr. Lindi Adie  ***

## 2022-05-09 ENCOUNTER — Other Ambulatory Visit: Payer: Self-pay

## 2022-05-09 ENCOUNTER — Inpatient Hospital Stay (HOSPITAL_BASED_OUTPATIENT_CLINIC_OR_DEPARTMENT_OTHER): Payer: 59 | Admitting: Hematology and Oncology

## 2022-05-09 ENCOUNTER — Inpatient Hospital Stay: Payer: 59 | Attending: Hematology and Oncology

## 2022-05-09 ENCOUNTER — Inpatient Hospital Stay: Payer: 59

## 2022-05-09 VITALS — BP 96/63 | HR 82 | Resp 17

## 2022-05-09 DIAGNOSIS — Z006 Encounter for examination for normal comparison and control in clinical research program: Secondary | ICD-10-CM | POA: Insufficient documentation

## 2022-05-09 DIAGNOSIS — C50411 Malignant neoplasm of upper-outer quadrant of right female breast: Secondary | ICD-10-CM

## 2022-05-09 DIAGNOSIS — Z170 Estrogen receptor positive status [ER+]: Secondary | ICD-10-CM | POA: Diagnosis not present

## 2022-05-09 DIAGNOSIS — Z95828 Presence of other vascular implants and grafts: Secondary | ICD-10-CM

## 2022-05-09 DIAGNOSIS — Z5111 Encounter for antineoplastic chemotherapy: Secondary | ICD-10-CM | POA: Diagnosis not present

## 2022-05-09 DIAGNOSIS — Z79899 Other long term (current) drug therapy: Secondary | ICD-10-CM | POA: Insufficient documentation

## 2022-05-09 LAB — CBC WITH DIFFERENTIAL (CANCER CENTER ONLY)
Abs Immature Granulocytes: 0.02 10*3/uL (ref 0.00–0.07)
Basophils Absolute: 0 10*3/uL (ref 0.0–0.1)
Basophils Relative: 1 %
Eosinophils Absolute: 0.2 10*3/uL (ref 0.0–0.5)
Eosinophils Relative: 6 %
HCT: 40.5 % (ref 36.0–46.0)
Hemoglobin: 14.3 g/dL (ref 12.0–15.0)
Immature Granulocytes: 1 %
Lymphocytes Relative: 31 %
Lymphs Abs: 1.3 10*3/uL (ref 0.7–4.0)
MCH: 31.8 pg (ref 26.0–34.0)
MCHC: 35.3 g/dL (ref 30.0–36.0)
MCV: 90.2 fL (ref 80.0–100.0)
Monocytes Absolute: 0.5 10*3/uL (ref 0.1–1.0)
Monocytes Relative: 13 %
Neutro Abs: 2 10*3/uL (ref 1.7–7.7)
Neutrophils Relative %: 48 %
Platelet Count: 204 10*3/uL (ref 150–400)
RBC: 4.49 MIL/uL (ref 3.87–5.11)
RDW: 12.9 % (ref 11.5–15.5)
WBC Count: 4 10*3/uL (ref 4.0–10.5)
nRBC: 0 % (ref 0.0–0.2)

## 2022-05-09 LAB — CMP (CANCER CENTER ONLY)
ALT: 21 U/L (ref 0–44)
AST: 27 U/L (ref 15–41)
Albumin: 4.3 g/dL (ref 3.5–5.0)
Alkaline Phosphatase: 109 U/L (ref 38–126)
Anion gap: 7 (ref 5–15)
BUN: 12 mg/dL (ref 6–20)
CO2: 28 mmol/L (ref 22–32)
Calcium: 9.3 mg/dL (ref 8.9–10.3)
Chloride: 104 mmol/L (ref 98–111)
Creatinine: 0.85 mg/dL (ref 0.44–1.00)
GFR, Estimated: >60 mL/min (ref >60)
Glucose, Bld: 100 mg/dL — ABNORMAL HIGH (ref 70–99)
Potassium: 3.7 mmol/L (ref 3.5–5.1)
Sodium: 139 mmol/L (ref 135–145)
Total Bilirubin: 0.6 mg/dL (ref 0.3–1.2)
Total Protein: 7.5 g/dL (ref 6.5–8.1)

## 2022-05-09 LAB — PREGNANCY, URINE: Preg Test, Ur: NEGATIVE

## 2022-05-09 MED ORDER — ACETAMINOPHEN 325 MG PO TABS
650.0000 mg | ORAL_TABLET | Freq: Once | ORAL | Status: AC
  Administered 2022-05-09: 650 mg via ORAL
  Filled 2022-05-09: qty 2

## 2022-05-09 MED ORDER — SODIUM CHLORIDE 0.9% FLUSH
10.0000 mL | Freq: Once | INTRAVENOUS | Status: AC
  Administered 2022-05-09: 10 mL

## 2022-05-09 MED ORDER — GOSERELIN ACETATE 3.6 MG ~~LOC~~ IMPL
3.6000 mg | DRUG_IMPLANT | Freq: Once | SUBCUTANEOUS | Status: AC
  Administered 2022-05-09: 3.6 mg via SUBCUTANEOUS
  Filled 2022-05-09: qty 3.6

## 2022-05-09 MED ORDER — INV-TUCATINIB/PLACEBO 150 MG TABLET ALLIANCE A011801: 300.0000 mg | ORAL_TABLET | Freq: Two times a day (BID) | ORAL | 0 refills | Status: AC

## 2022-05-09 MED ORDER — HEPARIN SOD (PORK) LOCK FLUSH 100 UNIT/ML IV SOLN
500.0000 [IU] | Freq: Once | INTRAVENOUS | Status: AC | PRN
  Administered 2022-05-09: 500 [IU]

## 2022-05-09 MED ORDER — SODIUM CHLORIDE 0.9 % IV SOLN: Freq: Once | INTRAVENOUS | Status: AC

## 2022-05-09 MED ORDER — DIPHENHYDRAMINE HCL 25 MG PO CAPS
50.0000 mg | ORAL_CAPSULE | Freq: Once | ORAL | Status: AC
  Administered 2022-05-09: 50 mg via ORAL
  Filled 2022-05-09: qty 2

## 2022-05-09 MED ORDER — SODIUM CHLORIDE 0.9 % IV SOLN
3.6000 mg/kg | Freq: Once | INTRAVENOUS | Status: AC
  Administered 2022-05-09: 220 mg via INTRAVENOUS
  Filled 2022-05-09: qty 8

## 2022-05-09 MED ORDER — ONDANSETRON HCL 4 MG/2ML IJ SOLN
8.0000 mg | Freq: Once | INTRAMUSCULAR | Status: AC
  Administered 2022-05-09: 8 mg via INTRAVENOUS
  Filled 2022-05-09: qty 4

## 2022-05-09 MED ORDER — CYANOCOBALAMIN 1000 MCG/ML IJ SOLN
1000.0000 ug | Freq: Once | INTRAMUSCULAR | Status: AC
  Administered 2022-05-09: 1000 ug via INTRAMUSCULAR
  Filled 2022-05-09: qty 1

## 2022-05-09 MED ORDER — SODIUM CHLORIDE 0.9 % IV SOLN: 8.0000 mg | Freq: Once | INTRAVENOUS | Status: DC

## 2022-05-09 MED ORDER — SODIUM CHLORIDE 0.9% FLUSH
10.0000 mL | INTRAVENOUS | Status: DC | PRN
  Administered 2022-05-09: 10 mL

## 2022-05-09 NOTE — Patient Instructions (Addendum)
[facility] ONCOLOGY  Discharge Instructions: Thank you for choosing [facility] to provide your oncology and hematology care.   If you have a lab appointment with the [facility], please go directly to the [facility] and check in at the registration area.   Wear comfortable clothing and clothing appropriate for easy access to any Portacath or PICC line.   We strive to give you quality time with your provider. You may need to reschedule your appointment if you arrive late (15 or more minutes).  Arriving late affects you and other patients whose appointments are after yours.  Also, if you miss three or more appointments without notifying the office, you may be dismissed from the clinic at the provider's discretion.      For prescription refill requests, have your pharmacy contact our office and allow 72 hours for refills to be completed.    Today you received the following chemotherapy and/or immunotherapy agents: trastuzumab emtansine      To help prevent nausea and vomiting after your treatment, we encourage you to take your nausea medication as directed.  BELOW ARE SYMPTOMS THAT SHOULD BE REPORTED IMMEDIATELY: *FEVER GREATER THAN 100.4 F (38 C) OR HIGHER *CHILLS OR SWEATING *NAUSEA AND VOMITING THAT IS NOT CONTROLLED WITH YOUR NAUSEA MEDICATION *UNUSUAL SHORTNESS OF BREATH *UNUSUAL BRUISING OR BLEEDING *URINARY PROBLEMS (pain or burning when urinating, or frequent urination) *BOWEL PROBLEMS (unusual diarrhea, constipation, pain near the anus) TENDERNESS IN MOUTH AND THROAT WITH OR WITHOUT PRESENCE OF ULCERS (sore throat, sores in mouth, or a toothache) UNUSUAL RASH, SWELLING OR PAIN  UNUSUAL VAGINAL DISCHARGE OR ITCHING   Items with * indicate a potential emergency and should be followed up as soon as possible or go to the Emergency Department if any problems should occur.  Please show the CHEMOTHERAPY ALERT CARD or IMMUNOTHERAPY ALERT CARD at  check-in to the Emergency Department and triage nurse.  Should you have questions after your visit or need to cancel or reschedule your appointment, please contact [facility]  Dept: [phone]  and follow the prompts.  Office hours are 8:00 a.m. to 4:30 p.m. Monday - Friday. Please note that voicemails left after 4:00 p.m. may not be returned until the following business day.  We are closed weekends and major holidays. You have access to a nurse at all times for urgent questions. Please call the main number to the clinic Dept: [phone] and follow the prompts.   For any non-urgent questions, you may also contact your provider using MyChart. We now offer e-Visits for anyone 60 and older to request care online for non-urgent symptoms. For details visit [email or web address].   Also download the MyChart app! Go to the app store, search "MyChart", open the app, select [facility], and log in with your MyChart username and password.  Masks are optional in the cancer centers. If you would like for your care team to wear a mask while they are taking care of you, please let them know. You may have one support person who is at least 37 years old accompany you for your appointments.

## 2022-05-09 NOTE — Assessment & Plan Note (Signed)
Palpable lump in the right breast. Diagnostic mammogram and [place]: a 3.8 cm mass at the retroareolar right breast, 1 prominent right axillary lymph node with a 4 mm cortex, 1 other which is borderline measuring 3 mm. 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 3 o'clock position: Biopsy fibroadenoma  Treatment plan: 1. Neoadjuvant chemotherapy with TCH Perjeta 6 cycles followed by Herceptin Perjeta maintenance versus Kadcyla maintenance (based on response to neoadjuvant chemo) for 1 year 2.Bilateral Mastectomies: Left mastectomy: Benign Right mastectomy: Multifocal ILC 1.8 cm, 0.9 cm, margins negative, 1/2 lymph nodes positive, ER 50%, PR 30%, HER2 1+ IHC 3.Compass HER2 clinical trial: Randomization between Kadcyla versus Kadcyla plus Tucatinib 4.Followed by adjuvant radiation therapy 5.Followed by antiestrogen therapy  ------------------------------------------------------------------------------------------------------------------------ Current treatment: Kadcylacycle 53mintenance (Compass HER2 clinical trial)+ Tucatinib vs Placebo Echocardiogram March 2023: EF 55 to 60%  Hospitalization 01/22/2022-01/26/2022 abscess/cellulitis required removal of the implant The plan is now a delayed reconstruction  Adverse Effects: Tolerating it well 1. Mild diarrhea: subsided 2. Headaches: Resolved  Radiation: 02/23/22- 04/13/22 Return to clinic every 3 weeks for Kadcyla

## 2022-05-09 NOTE — Research (Cosign Needed Addendum)
CompassHER2 Residual Disease, A Double-Blinded, Phase III Randomized Trial of T-DM1 and Placebo Compared with T-DMI and Tucatinib  Patient presented to clinic unaccompanied this morning for cycle 5 day 1.  LABS: CBC and CMP collected via port-a-cath per consent and study protocol. Patient tolerated well without complaint.  VITAL SIGNS: Completed, including weight. See vital signs flowsheet.   H&P: Completed by Dr Lindi Adie. See clinic note dated 05/09/22. Provider reviewed lab results and cleared patient to continue treatment.  RESEARCH: Concomitant medications reviewed. Medication list updated and verified with patient.   Solicited and other adverse events assessed per protocol. Patient reported that she again had mild nausea (grade 1) for three days following her T-DM1 infusion; worsened nausea (grade 2) returned at the end of the cycle, along with mild diarrhea (grade 1) and one episode of vomiting (grade 1). Symptoms resolved spontaneously; patient believes she may have had a "GI bug" that she caught from her young son. Dr Lindi Adie recommended an additional dose of IV ondanestron with today's premedications. Patient reported that several of her ongoing AEs had resolved; patient reported that her bloating (grade 1) resolved after last appointment. She also reported that her folliculitis (grade 1) did not recur with her most recent infusion. Patient also reported several new AEs. Patient noted moderate anorexia (grade 2) that altered her oral intake for three days following her T-DM1 infusion. She reported increased fatigue (now grade 2); Dr Lindi Adie recommended vitamin B-12. She also noted mild left breast pain (grade 1). Dr Lindi Adie completed a breast exam and noted a small nodule; recommended an ultrasound to further assess. See table below.  Patient returned remaining cycle 4 tucatinib/placebo and medication diary. She returned two bottles, one with 6 tablets remaining and one unopened bottle of 50 tablets  (6 + 50 = 56; 100 total tablets dispensed - 56 remaining = 44 tablets taken). Medication diary reflects the same: 44 tablets taken; 22 total doses. Remaining tucatinib/placebo returned to pharmacy. Pill count verified with Raul Del, PharmD.   Cycle 5 tucatinib/placebo dispensed by pharmacy. Research RN verified that drug had correct patient name and study ID number. Drug was dispensed in two bottles of 50 '150mg'$  tablets, lot #[id number] (bottle 1: [id number], bottle 2: [id number]), exp 05/2023. Patient instructed to continue taking two tablets ('300mg'$  total) orally twice daily and to continue recording doses and side effects on medication diary. Reinforced importance of compliance with study medication. Patient verbalized understanding. Patient was given cycle 5 medication diary.   INFUSION: Patient received clinic standard pre-medications for T-DM1 treatment -- '650mg'$  acetaminophen PO and '50mg'$  diphenhydramine PO. PKs not required this cycle per protocol. Patient tolerated infusion well without complaint. Patient took morning dose of tucatinib/placebo at 0945 from cycle 5 supply.   ADVERSE EVENT LOG: Study/Protocol: [facility] [id number] CompassHER2 Cycle 5   Baseline: anxiety (grade 2), fatigue (grade 1), herpes simplex virus, hypotension (grade 1), vascular access complication (grade 3). Not reportable (unrelated/unlikely to be related to study treatment and < grade 3): breast pain (grade 1), folliculitis (grade 1).   Event (solicited vs other reportable) Grade CTCAE v5.0 Onset Date Resolved Date Attribution to Study Treatment Treatment Comments  Neutrophil count decreased 0 N/A N/A N/A N/A   Platelet count decreased 0 N/A N/A N/A N/A   Rash (maculo-papular) 0 N/A N/A N/A N/A   Epistaxis 0 N/A N/A N/A N/A    Diarrhea 1 05/06/22 05/08/22 Unrelated N/A Patient reported a baseline of 2-3 stools/day.Possible GI bug; symptoms resolved spontaneously.  Nausea 1 04/20/22  04/22/22 Possible Ondansetron Patient reported mild  nausea x3 days after T-DM1 infusion.  Nausea 2 05/06/22 05/08/22 Unrelated N/A Possible GI bug; symptoms resolved spontaneously.  Vomiting 1 05/07/22 05/07/22 Unrelated N/A 1 episode of vomiting during possible GI bug; symptoms resolved spontaneously.  Bilirubin increased 0 N/A N/A N/A N/A    AST increased 0 N/A N/A N/A N/A    ALT increased 0 N/A N/A N/A N/A    Anemia 0 N/A N/A N/A N/A    Mucositis oral 0 N/A N/A N/A N/A    Palmar-plantar erythrodysesthesia 0 N/A N/A N/A N/A    Peripheral neuropathy -- motor 0 N/A N/A N/A N/A    Peripheral neuropathy -- sensory 0 N/A N/A N/A N/A    Anorexia 2 04/20/22 04/22/22 Probable N/A Patient reported altered oral intake due to loss of appetite x3 days after T-DM1 infusion.  Fatigue 2 04/20/22 Ongoing Probable Vitamin B12 Grade 1 at baseline.  Bloating 1 04/06/22 04/20/22 Probable N/A Patient reported intermittent abdominal bloating + "trapped gas" feeling. No changes in bowel habits or oral intake; now resolved.   PLAN: Next treatment visit is 05/31/22. Visit will include labs, a provider visit, and an infusion appointment. Patient reminded to bring remaining tucatinib/placebo tablets and cycle 5 medication diary to [facility] appointment. Patient verbalized understanding. Research will meet with patient and accompany her to appointments on C6D1. Patient thanked for her time and continued voluntary participation in this study. Patient has been provided direct contact information and is encouraged to contact this nurse for any questions or concerns.  Vickii Penna, RN, BSN, CPN Clinical Research Nurse I [phone]  05/09/2022 3:31 PM

## 2022-05-10 ENCOUNTER — Inpatient Hospital Stay: Payer: 59 | Admitting: Hematology and Oncology

## 2022-05-10 ENCOUNTER — Inpatient Hospital Stay: Payer: 59

## 2022-05-10 ENCOUNTER — Telehealth: Payer: Self-pay | Admitting: Hematology and Oncology

## 2022-05-10 NOTE — Telephone Encounter (Signed)
Scheduled appointment per 8/2 los. Patient is aware.

## 2022-05-15 ENCOUNTER — Other Ambulatory Visit: Payer: Self-pay | Admitting: Hematology and Oncology

## 2022-05-15 ENCOUNTER — Encounter: Payer: Self-pay | Admitting: Radiation Oncology

## 2022-05-15 ENCOUNTER — Ambulatory Visit
Admission: RE | Admit: 2022-05-15 | Discharge: 2022-05-15 | Disposition: A | Discharge status: Home / Self Care | Payer: 59 | Source: Ambulatory Visit | Attending: Hematology and Oncology | Admitting: Hematology and Oncology

## 2022-05-15 DIAGNOSIS — N6320 Unspecified lump in the left breast, unspecified quadrant: Secondary | ICD-10-CM

## 2022-05-15 DIAGNOSIS — C50411 Malignant neoplasm of upper-outer quadrant of right female breast: Secondary | ICD-10-CM

## 2022-05-16 NOTE — Progress Notes (Incomplete)
  Radiation Oncology         ([phone]) [phone] ________________________________  Patient Name: Jenna Cross MRN: [id number] DOB: [birth year] Referring Physician: Nicholas Lose (Profile Not Attached) Date of Service: 04/13/2022 [facility], [place]                                                        End Of Treatment Note  Diagnoses: C50.411-Malignant neoplasm of upper-outer quadrant of right female breast  Cancer Staging: S/p bilateral mastectomies and neo-adjuvant chemotherapy:  Stage IB (cT2, cN1, cM0) Right Breast UOQ, Invasive Lobular Carcinoma, ER+ / PR+ / Her2-, Grade 2  Intent: Curative  Radiation Treatment Dates: 02/22/2022 through 04/13/2022 Site Technique Total Dose (Gy) Dose per Fx (Gy) Completed Fx Beam Energies  Chest Wall, Right: CW_R 3D 50.4/50.4 1.8 28/28 10X  Sclav-RT: SCV_R 3D 50.4/50.4 1.8 28/28 6X, 10X  Chest Wall, Right: CW_R_Bst Electron 10/10 2 5/5 6E   Narrative: The patient tolerated radiation therapy relatively well. On the date of her final weekly treatment check on 04/12/22, the patient reported mild fatigue and skin redness. Physical exam performed on that same date revealed some erythema and hyperpigmentation changes to the right breast area. Some dry desquamation was also noted along the lateral treatment area, but no moist desquamation was noted.    Plan: The patient will follow-up with radiation oncology in one month .  ________________________________________________ -----------------------------------  Blair Promise, PhD, MD  This document serves as a record of services personally performed by Gery Pray, MD. It was created on his behalf by Roney Mans, a trained medical scribe. The creation of this record is based on the scribe's personal observations and the provider's statements to them. This document has been checked and approved by the attending provider.

## 2022-05-17 ENCOUNTER — Encounter: Payer: Self-pay | Admitting: Hematology and Oncology

## 2022-05-17 ENCOUNTER — Telehealth: Payer: Self-pay | Admitting: *Deleted

## 2022-05-17 ENCOUNTER — Ambulatory Visit
Admission: RE | Admit: 2022-05-17 | Discharge: 2022-05-17 | Disposition: A | Discharge status: Home / Self Care | Payer: 59 | Source: Ambulatory Visit | Attending: Radiation Oncology | Admitting: Radiation Oncology

## 2022-05-17 ENCOUNTER — Other Ambulatory Visit: Payer: Self-pay | Admitting: Hematology and Oncology

## 2022-05-17 HISTORY — DX: Personal history of irradiation: Z92.3

## 2022-05-17 NOTE — Telephone Encounter (Signed)
CALLED PATIENT TO ASK ABOUT RESCHEDULING FU ON 05-17-22, PATIENT AGREED TO COME ON 05-28-22 @ 11:30 AM

## 2022-05-18 ENCOUNTER — Other Ambulatory Visit: Payer: Self-pay

## 2022-05-18 MED ORDER — VENLAFAXINE HCL ER 37.5 MG PO CP24: 37.5000 mg | ORAL_CAPSULE | Freq: Every day | ORAL | 1 refills | Status: DC

## 2022-05-25 NOTE — Progress Notes (Signed)
Radiation Oncology         [phone] ________________________________  Name: Jenna Cross MRN: [id number]  Date: 05/28/2022  DOB: April 19, 1986  Follow-Up Visit Note  CC: Dian Queen, MD  Nicholas Lose, MD  No diagnosis found.  Diagnosis:  S/p bilateral mastectomies and neo-adjuvant chemotherapy:  Stage IB (cT2, cN1, cM0) Right Breast UOQ, Invasive Lobular Carcinoma, ER+ / PR+ / Her2-, Grade 2  Interval Since Last Radiation: 1 month and 2 weeks   Intent: Curative  Radiation Treatment Dates: 02/22/2022 through 04/13/2022 Site Technique Total Dose (Gy) Dose per Fx (Gy) Completed Fx Beam Energies  Chest Wall, Right: CW_R 3D 50.4/50.4 1.8 28/28 10X  Sclav-RT: SCV_R 3D 50.4/50.4 1.8 28/28 6X, 10X  Chest Wall, Right: CW_R_Bst Electron 10/10 2 5/5 6E    Narrative:  The patient returns today for routine follow-up. The patient tolerated radiation therapy relatively well. On the date of her final weekly treatment check on 04/12/22, the patient reported mild fatigue and skin redness. Physical exam performed on that same date revealed some erythema and hyperpigmentation changes to the right breast area. Some dry desquamation was also noted along the lateral treatment area, but no moist desquamation was appreciated.     In the interval since she was last seen for follow up this past May, the patient has continued on with kadcyla maintenance every 3 weeks under the care of Dr. Lindi Adie. Per recent follow-up visits with Dr. Lindi Adie, she has been tolerating kadcyla well other than mild diarrhea and headaches which have since resolved. 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  The patient is also on Xarelto which she began this past December 2022 in management of left jugular DVT.   On 05/15/22, the patient presented for a left axillary ultrasound for  evaluation of new small lump in the reconstructed left breast. Imaging revealed a superficial nodule at the palpable site of concern in the 3 o'clock left breast area, measuring 1.9 cm. The nodule was noted to likely represent fat necrosis. A superficial similar appearing nodule was also appreciated just superior to the 3 o'clock nodule.   Of note: during her most recent follow up visit with Dr. Lindi Adie on 05/09/22, the patient complained of some left breast discomfort.   ***                            Allergies:  is allergic to wound dressings.  Meds: Current Outpatient Medications  Medication Sig Dispense Refill   acetaminophen (TYLENOL) 500 MG tablet Take 1,000 mg by mouth every 6 (six) hours as needed for mild pain or headache.     ALPRAZolam (XANAX) 0.5 MG tablet Take 0.5 mg by mouth 3 (three) times daily as needed for anxiety.     diphenoxylate-atropine (LOMOTIL) 2.5-0.025 MG tablet Take 1 tablet by mouth 3 (three) times daily as needed for diarrhea or loose stools. 30 tablet 3   hydrocortisone cream 0.5 % Apply 1 Application topically as needed for itching.     Investigational tucatanib/placebo 150 MG tablet (ONT-380,NSC #[id number]) [facility] [id number] Take 2 tablets (300 mg total) 2 (two) times dailyfor 21 days.  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 Tucatinib or placebo should take orally twice daily with or without food, approximately 12 hours apart between doses. Tablets must be swallowed whole and may not be crushed, chewed or  dissolved in liquid. CAUTION: Chemotherapy/Biotherapy 84 tablet 0   lidocaine-prilocaine (EMLA) cream lidocaine-prilocaine 2.5 %-2.5 % topical cream  APPLY TO AFFECTED AREA ONCE AS DIRECTED     loratadine (CLARITIN) 10 MG tablet Take 10 mg by mouth daily as needed for allergies.     ondansetron (ZOFRAN) 8 MG tablet Take 1 tablet (8 mg total) by mouth every 8 (eight) hours as needed for nausea. 30 tablet 3   predniSONE (STERAPRED UNI-PAK 21 TAB) 10 MG (21) TBPK tablet 6 day taper;  take as directed on package instructions (Patient not taking: Reported on 04/19/2022) 21 tablet 0   valACYclovir (VALTREX) 500 MG tablet Take 500 mg by mouth every evening.     venlafaxine XR (EFFEXOR-XR) 37.5 MG 24 hr capsule Take 1 capsule (37.5 mg total) by mouth daily with breakfast. 90 capsule 1   XARELTO 20 MG TABS tablet TAKE 1 TABLET BY MOUTH DAILY WITH SUPPER. 30 tablet 5   No current facility-administered medications for this encounter.    Physical Findings: The patient is in no acute distress. Patient is alert and oriented.  vitals were not taken for this visit. .  No significant changes. Lungs are clear to auscultation bilaterally. Heart has regular rate and rhythm. No palpable cervical, supraclavicular, or axillary adenopathy. Abdomen soft, non-tender, normal bowel sounds.  Right breast: *** Left breast: ***   Lab Findings: Lab Results  Component Value Date   WBC 4.0 05/09/2022   HGB 14.3 05/09/2022   HCT 40.5 05/09/2022   MCV 90.2 05/09/2022   PLT 204 05/09/2022    Radiographic Findings: US BREAST LTD UNI LEFT INC AXILLA  Result Date: 05/15/2022 CLINICAL DATA:  36 year old female with history of left breast cancer status post bilateral mastectomies with implant reconstruction on the left. Patient presents with a new small lump in the reconstructed left breast. Patient's last left breast surgery was in March or April of this year. EXAM: ULTRASOUND OF THE LEFT BREAST COMPARISON:  Previous exam(s). FINDINGS: On physical exam, at the site of concern reported by the patient I feel a small superficial nodule. There is an additional similar appearing nodule more superiorly above the mastectomy scar in the left breast. Targeted ultrasound is performed at the palpable site of concern reported by the patient in the left breast at 3 o'clock 2 cm from the nipple demonstrating an oval hypoechoic mass measuring 1.9 x 0.3 x 0.8 cm which is either dermal or subdermal in location and most  likely represents fat necrosis. No internal vascularity. There is a similar appearing superficial nodule just superior to this in the left breast. IMPRESSION: At the palpable site of concern in the left breast at 3 o'clock there is a superficial nodule most likely representing fat necrosis. RECOMMENDATION: Left breast ultrasound in 3 months. I have discussed the findings and recommendations with the patient. If applicable, a reminder letter will be sent to the patient regarding the next appointment. BI-RADS CATEGORY  3: Probably benign. Electronically Signed   By: Audie Pinto M.D.   On: 05/15/2022 15:16   Impression:   S/p bilateral mastectomies and neo-adjuvant chemotherapy:  Stage IB (cT2, cN1, cM0) Right Breast UOQ, Invasive Lobular Carcinoma, ER+ / PR+ / Her2-, Grade 2  The patient is recovering from the effects of radiation.  ***  Plan:  ***   *** minutes of total time was spent for this patient encounter, including preparation, face-to-face counseling with the patient and coordination of care, physical exam, and documentation of the  encounter. ____________________________________  Blair Promise, PhD, MD  This document serves as a record of services personally performed by Gery Pray, MD. It was created on his behalf by Roney Mans, a trained medical scribe. The creation of this record is based on the scribe's personal observations and the provider's statements to them. This document has been checked and approved by the attending provider.

## 2022-05-27 NOTE — Progress Notes (Signed)
Patient Care Team: Dian Queen, MD as PCP - General (Obstetrics and Gynecology) Dian Queen, MD (Obstetrics and Gynecology) Mauro Kaufmann, RN as Oncology Nurse Navigator Rockwell Germany, RN as Oncology Nurse Navigator Nicholas Lose, MD as Consulting Physician (Hematology and Oncology) Gery Pray, MD as Consulting Physician (Radiation Oncology) Rolm Bookbinder, MD as Consulting Physician (General Surgery)  DIAGNOSIS: No diagnosis found.  SUMMARY OF ONCOLOGIC HISTORY: Oncology History  Malignant neoplasm of upper-outer quadrant of right breast in female, estrogen receptor positive ([place])  06/23/2021 Initial Diagnosis   Palpable lump in the right breast. Diagnostic mammogram and [place]: a 3.8 cm mass at the retroareolar right breast, 1 prominent right axillary lymph node with a 4 mm cortex, 1 other which is borderline measuring 3 mm. 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  3 o'clock position: Biopsy fibroadenoma   07/12/2021 -  Neo-Adjuvant Chemotherapy   Neoadjuvant chemotherapy with TCH Perjeta 6 cycles followed by Herceptin Perjeta maintenance versus Kadcyla maintenance (based on response to neoadjuvant chemo) for 1 year   07/28/2021 Genetic Testing   Negative hereditary cancer genetic testing: no pathogenic variants detected in [facility] CancerNext-Expanded +RNAinsight Panel.  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  The report date is July 28, 2021.   The CancerNext-Expanded gene panel offered by [facility] and includes sequencing, rearrangement, and RNA analysis for the following 77 genes: AIP, ALK, APC, ATM, AXIN2, BAP1, BARD1, BLM, BMPR1A, BRCA1, BRCA2, BRIP1, CDC73, CDH1, CDK4, CDKN1B, CDKN2A, CHEK2, CTNNA1, DICER1, FANCC, FH, FLCN, GALNT12, KIF1B, LZTR1, MAX, MEN1, MET, MLH1, MSH2, MSH3, MSH6, MUTYH, NBN, NF1, NF2, NTHL1, PALB2, PHOX2B, PMS2, POT1,  PRKAR1A, PTCH1, PTEN, RAD51C, RAD51D, RB1, RECQL, RET, SDHA, SDHAF2, SDHB, SDHC, SDHD, SMAD4, SMARCA4, SMARCB1, SMARCE1, STK11, SUFU, TMEM127, TP53, TSC1, TSC2, VHL and XRCC2 (sequencing and deletion/duplication); EGFR, EGLN1, HOXB13, KIT, MITF, PDGFRA, POLD1, and POLE (sequencing only); EPCAM and GREM1 (deletion/duplication only).    12/06/2021 Surgery   Bilateral Mastectomies: Left mastectomy: Benign Right mastectomy: Multifocal ILC 1.8 cm, 0.9 cm, margins negative, 1/2 lymph nodes positive, ER 50%, PR 30%, HER2 1+ IHC   12/26/2021 - 12/29/2021 Chemotherapy   Patient is on Treatment Plan : BREAST ADO-Trastuzumab Emtansine (Kadcyla) q21d       CHIEF COMPLIANT: Follow-up of right breast cancer on Kadcyla.    INTERVAL HISTORY: Jenna Cross is a 36 y.o with the above mention. She presents to the clinic today for a follow-up.    ALLERGIES:  is allergic to wound dressings.  MEDICATIONS:  Current Outpatient Medications  Medication Sig Dispense Refill   acetaminophen (TYLENOL) 500 MG tablet Take 1,000 mg by mouth every 6 (six) hours as needed for mild pain or headache.     ALPRAZolam (XANAX) 0.5 MG tablet Take 0.5 mg by mouth 3 (three) times daily as needed for anxiety.     diphenoxylate-atropine (LOMOTIL) 2.5-0.025 MG tablet Take 1 tablet by mouth 3 (three) times daily as needed for diarrhea or loose stools. 30 tablet 3   hydrocortisone cream 0.5 % Apply 1 Application topically as needed for itching.     Investigational tucatanib/placebo 150 MG tablet (ONT-380,NSC #[id number]) [facility] [id number] Take 2 tablets (300 mg total) 2 (two) times dailyfor 21 days.  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 Tucatinib or placebo should take orally twice daily with or without food, approximately 12 hours apart between doses. Tablets must be swallowed whole and may not be crushed, chewed or dissolved in liquid. CAUTION: Chemotherapy/Biotherapy 84  tablet 0   lidocaine-prilocaine (EMLA) cream lidocaine-prilocaine 2.5 %-2.5 %  topical cream  APPLY TO AFFECTED AREA ONCE AS DIRECTED     loratadine (CLARITIN) 10 MG tablet Take 10 mg by mouth daily as needed for allergies.     ondansetron (ZOFRAN) 8 MG tablet Take 1 tablet (8 mg total) by mouth every 8 (eight) hours as needed for nausea. 30 tablet 3   predniSONE (STERAPRED UNI-PAK 21 TAB) 10 MG (21) TBPK tablet 6 day taper; take as directed on package instructions (Patient not taking: Reported on 04/19/2022) 21 tablet 0   valACYclovir (VALTREX) 500 MG tablet Take 500 mg by mouth every evening.     venlafaxine XR (EFFEXOR-XR) 37.5 MG 24 hr capsule Take 1 capsule (37.5 mg total) by mouth daily with breakfast. 90 capsule 1   XARELTO 20 MG TABS tablet TAKE 1 TABLET BY MOUTH DAILY WITH SUPPER. 30 tablet 5   No current facility-administered medications for this visit.    PHYSICAL EXAMINATION: ECOG PERFORMANCE STATUS: {CHL ONC ECOG PS:[phone]}  There were no vitals filed for this visit. There were no vitals filed for this visit.  BREAST:*** No palpable masses or nodules in either right or left breasts. No palpable axillary supraclavicular or infraclavicular adenopathy no breast tenderness or nipple discharge. (exam performed in the presence of a chaperone)  LABORATORY DATA:  I have reviewed the data as listed    Latest Ref Rng & Units 05/09/2022    8:44 AM 04/19/2022    8:47 AM 04/02/2022    9:45 AM  CMP  Glucose 70 - 99 mg/dL 100  90  114   BUN 6 - 20 mg/dL 12  15  8    Creatinine 0.44 - 1.00 mg/dL 0.85  0.98  0.79   Sodium 135 - 145 mmol/L 139  140  139   Potassium 3.5 - 5.1 mmol/L 3.7  3.7  3.4   Chloride 98 - 111 mmol/L 104  106  105   CO2 22 - 32 mmol/L 28  28  28    Calcium 8.9 - 10.3 mg/dL 9.3  9.5  9.6   Total Protein 6.5 - 8.1 g/dL 7.5  6.9  7.0   Total Bilirubin 0.3 - 1.2 mg/dL 0.6  0.9  0.4   Alkaline Phos 38 - 126 U/L 109  98  146   AST 15 - 41 U/L 27  20  40   ALT 0 - 44 U/L 21  21  27      Lab Results  Component Value Date   WBC 4.0 05/09/2022    HGB 14.3 05/09/2022   HCT 40.5 05/09/2022   MCV 90.2 05/09/2022   PLT 204 05/09/2022   NEUTROABS 2.0 05/09/2022    ASSESSMENT & PLAN:  No problem-specific Assessment & Plan notes found for this encounter.    No orders of the defined types were placed in this encounter.  The patient has a good understanding of the overall plan. she agrees with it. she will call with any problems that may develop before the next visit here. Total time spent: 30 mins including face to face time and time spent for planning, charting and co-ordination of care   Suzzette Righter, [place] 05/27/22    I Gardiner Coins am scribing for Dr. Lindi Adie  ***

## 2022-05-28 ENCOUNTER — Encounter: Payer: Self-pay | Admitting: Radiation Oncology

## 2022-05-28 ENCOUNTER — Ambulatory Visit
Admission: RE | Admit: 2022-05-28 | Discharge: 2022-05-28 | Disposition: A | Discharge status: Home / Self Care | Payer: 59 | Source: Ambulatory Visit | Attending: Radiation Oncology | Admitting: Radiation Oncology

## 2022-05-28 DIAGNOSIS — Z9013 Acquired absence of bilateral breasts and nipples: Secondary | ICD-10-CM | POA: Diagnosis not present

## 2022-05-28 DIAGNOSIS — C50411 Malignant neoplasm of upper-outer quadrant of right female breast: Secondary | ICD-10-CM | POA: Insufficient documentation

## 2022-05-28 DIAGNOSIS — Z79624 Long term (current) use of inhibitors of nucleotide synthesis: Secondary | ICD-10-CM | POA: Diagnosis not present

## 2022-05-28 DIAGNOSIS — Z923 Personal history of irradiation: Secondary | ICD-10-CM | POA: Diagnosis not present

## 2022-05-28 DIAGNOSIS — Z7952 Long term (current) use of systemic steroids: Secondary | ICD-10-CM | POA: Diagnosis not present

## 2022-05-28 DIAGNOSIS — Z170 Estrogen receptor positive status [ER+]: Secondary | ICD-10-CM | POA: Diagnosis not present

## 2022-05-28 DIAGNOSIS — Z7901 Long term (current) use of anticoagulants: Secondary | ICD-10-CM | POA: Insufficient documentation

## 2022-05-28 DIAGNOSIS — Z79899 Other long term (current) drug therapy: Secondary | ICD-10-CM | POA: Diagnosis not present

## 2022-05-28 NOTE — Progress Notes (Signed)
Jenna Cross is here today for follow up post radiation to the breast.   Breast Side:Right   They completed their radiation on: 04/13/22  Does the patient complain of any of the following: Post radiation skin issues: No Breast Tenderness: No Breast Swelling: No Lymphadema: No Range of Motion limitations: No Fatigue post radiation: No, patient states she is back to normal.  Appetite good/fair/poor: Fair  Additional comments if applicable:   BP 834/62 (BP Location: Left Arm, Patient Position: Sitting, Cuff Size: Normal)   Pulse 82   Temp 97.7 F (36.5 C)   Resp 18   Ht '5\' 4"'$  (1.626 m)   Wt 131 lb 9.6 oz (59.7 kg)   SpO2 100%   BMI 22.59 kg/m

## 2022-05-31 ENCOUNTER — Other Ambulatory Visit: Payer: Self-pay | Admitting: *Deleted

## 2022-05-31 ENCOUNTER — Inpatient Hospital Stay: Payer: 59 | Admitting: Dietician

## 2022-05-31 ENCOUNTER — Inpatient Hospital Stay: Payer: 59

## 2022-05-31 ENCOUNTER — Inpatient Hospital Stay (HOSPITAL_BASED_OUTPATIENT_CLINIC_OR_DEPARTMENT_OTHER): Payer: 59 | Admitting: Hematology and Oncology

## 2022-05-31 ENCOUNTER — Other Ambulatory Visit: Payer: Self-pay

## 2022-05-31 DIAGNOSIS — Z170 Estrogen receptor positive status [ER+]: Secondary | ICD-10-CM

## 2022-05-31 DIAGNOSIS — C50411 Malignant neoplasm of upper-outer quadrant of right female breast: Secondary | ICD-10-CM

## 2022-05-31 DIAGNOSIS — Z5111 Encounter for antineoplastic chemotherapy: Secondary | ICD-10-CM | POA: Diagnosis not present

## 2022-05-31 DIAGNOSIS — Z5181 Encounter for therapeutic drug level monitoring: Secondary | ICD-10-CM

## 2022-05-31 DIAGNOSIS — Z006 Encounter for examination for normal comparison and control in clinical research program: Secondary | ICD-10-CM

## 2022-05-31 DIAGNOSIS — Z95828 Presence of other vascular implants and grafts: Secondary | ICD-10-CM

## 2022-05-31 LAB — CBC WITH DIFFERENTIAL (CANCER CENTER ONLY)
Abs Immature Granulocytes: 0.02 10*3/uL (ref 0.00–0.07)
Basophils Absolute: 0 10*3/uL (ref 0.0–0.1)
Basophils Relative: 1 %
Eosinophils Absolute: 0.4 10*3/uL (ref 0.0–0.5)
Eosinophils Relative: 9 %
HCT: 37.5 % (ref 36.0–46.0)
Hemoglobin: 13.1 g/dL (ref 12.0–15.0)
Immature Granulocytes: 1 %
Lymphocytes Relative: 30 %
Lymphs Abs: 1.2 10*3/uL (ref 0.7–4.0)
MCH: 31.3 pg (ref 26.0–34.0)
MCHC: 34.9 g/dL (ref 30.0–36.0)
MCV: 89.7 fL (ref 80.0–100.0)
Monocytes Absolute: 0.3 10*3/uL (ref 0.1–1.0)
Monocytes Relative: 7 %
Neutro Abs: 2.1 10*3/uL (ref 1.7–7.7)
Neutrophils Relative %: 52 %
Platelet Count: 180 10*3/uL (ref 150–400)
RBC: 4.18 MIL/uL (ref 3.87–5.11)
RDW: 13.2 % (ref 11.5–15.5)
WBC Count: 4.1 10*3/uL (ref 4.0–10.5)
nRBC: 0 % (ref 0.0–0.2)

## 2022-05-31 LAB — CMP (CANCER CENTER ONLY)
ALT: 16 U/L (ref 0–44)
AST: 21 U/L (ref 15–41)
Albumin: 4 g/dL (ref 3.5–5.0)
Alkaline Phosphatase: 116 U/L (ref 38–126)
Anion gap: 3 — ABNORMAL LOW (ref 5–15)
BUN: 12 mg/dL (ref 6–20)
CO2: 30 mmol/L (ref 22–32)
Calcium: 9.3 mg/dL (ref 8.9–10.3)
Chloride: 107 mmol/L (ref 98–111)
Creatinine: 0.62 mg/dL (ref 0.44–1.00)
GFR, Estimated: >60 mL/min (ref >60)
Glucose, Bld: 92 mg/dL (ref 70–99)
Potassium: 3.6 mmol/L (ref 3.5–5.1)
Sodium: 140 mmol/L (ref 135–145)
Total Bilirubin: 0.4 mg/dL (ref 0.3–1.2)
Total Protein: 6.8 g/dL (ref 6.5–8.1)

## 2022-05-31 LAB — PREGNANCY, URINE: Preg Test, Ur: NEGATIVE

## 2022-05-31 MED ORDER — ONDANSETRON HCL 4 MG/2ML IJ SOLN
8.0000 mg | Freq: Once | INTRAMUSCULAR | Status: AC
  Administered 2022-05-31: 8 mg via INTRAVENOUS
  Filled 2022-05-31: qty 4

## 2022-05-31 MED ORDER — ACETAMINOPHEN 325 MG PO TABS
650.0000 mg | ORAL_TABLET | Freq: Once | ORAL | Status: AC
  Administered 2022-05-31: 650 mg via ORAL
  Filled 2022-05-31: qty 2

## 2022-05-31 MED ORDER — SODIUM CHLORIDE 0.9% FLUSH
10.0000 mL | Freq: Once | INTRAVENOUS | Status: AC
  Administered 2022-05-31: 10 mL

## 2022-05-31 MED ORDER — DIPHENHYDRAMINE HCL 25 MG PO CAPS
50.0000 mg | ORAL_CAPSULE | Freq: Once | ORAL | Status: AC
  Administered 2022-05-31: 50 mg via ORAL
  Filled 2022-05-31: qty 2

## 2022-05-31 MED ORDER — SODIUM CHLORIDE 0.9 % IV SOLN: Freq: Once | INTRAVENOUS | Status: AC

## 2022-05-31 MED ORDER — HEPARIN SOD (PORK) LOCK FLUSH 100 UNIT/ML IV SOLN
500.0000 [IU] | Freq: Once | INTRAVENOUS | Status: AC | PRN
  Administered 2022-05-31: 500 [IU]

## 2022-05-31 MED ORDER — SODIUM CHLORIDE 0.9% FLUSH
10.0000 mL | INTRAVENOUS | Status: DC | PRN
  Administered 2022-05-31: 10 mL

## 2022-05-31 MED ORDER — INV-TUCATINIB/PLACEBO 150 MG TABLET ALLIANCE A011801: 300.0000 mg | ORAL_TABLET | Freq: Two times a day (BID) | ORAL | 0 refills | Status: DC

## 2022-05-31 MED ORDER — SODIUM CHLORIDE 0.9 % IV SOLN
3.6000 mg/kg | Freq: Once | INTRAVENOUS | Status: AC
  Administered 2022-05-31: 220 mg via INTRAVENOUS
  Filled 2022-05-31: qty 8

## 2022-05-31 MED ORDER — LETROZOLE 2.5 MG PO TABS: 2.5000 mg | ORAL_TABLET | Freq: Every day | ORAL | 3 refills | Status: DC

## 2022-05-31 NOTE — Progress Notes (Signed)
Nutrition Assessment   Reason for Assessment: MST   ASSESSMENT: 36 year old female with breast cancer of right upper-outer quadrant, estrogen receptor positive. S/p adjuvant chemotherapy, bilateral mastectomies. Patient completed radiation therapy 5/19-7/7. She is currently receiving maintenance Kadcyla (Compass HER2 clinical trial) + Tucatinib vs Placebo.   Noted hospitalization 01/22/2022-01/26/2022 abscess/cellulitis required removal of the implant  Met with patient during infusion. She reports good appetite and eating well overall. Patient endorses a few days of mild nausea following treatment. She does not feel like eating "meals" these days. Pt recalls drinking smoothies with added protein powder, peanut butter sandwiches, cereal with milk and Ensure supplements. Pt reports appetite returns and gains a few pounds back. She denies vomiting, diarrhea, constipation.   Nutrition Focused Physical Exam: deferred    Medications: Xanax, Zofran, Prednisone, Effexor-XR, Xarelto   Labs: reviewed    Anthropometrics:   Height: 5'4" Weight: 132 lb 6.4 oz  UBW: 140 lb (Dec 2022) BMI: 22.73  NUTRITION DIAGNOSIS: No nutrition diagnosis at this time   INTERVENTION:  Continue drinking Ensure Plus/equivalent as needed - coupons provided  Contact information given   MONITORING, EVALUATION, GOAL: Pt will tolerate adequate calories and protein to promote stable weight   Next Visit: No follow-up scheduled - pt encouraged to contact with nutrition questions/concerns

## 2022-05-31 NOTE — Research (Signed)
CompassHER2 Residual Disease, A Double-Blinded, Phase III Randomized Trial of T-DM1 and Placebo Compared with T-DMI and Tucatinib  Patient presented to clinic unaccompanied this morning for cycle 6 day 1.  LABS: CBC and CMP collected via port-a-cath per consent and study protocol. Patient tolerated well without complaint.  VITAL SIGNS: Completed, including weight. See vital signs flowsheet.   H&P: Completed by Dr Lindi Adie. See clinic note dated 05/31/22. Provider reviewed lab results and cleared patient to continue treatment.  RESEARCH: Concomitant medications reviewed; Dr Lindi Adie instructed patient to begin letrozole now that she has recovered from radiation. Patient also finishing antibiotics course for urinary tract infection. Medication list updated and verified with patient.   Solicited and other adverse events assessed per protocol. Patient reported that she again had mild nausea (grade 1) and moderate anorexia (grade 2) for three days following her T-DM1 infusion; worsened nausea (grade 2) returned towards the middle of the cycle, along with mild diarrhea (grade 1). Symptoms resolved spontaneously; patient believes she may have had another "GI bug" that she caught from her young son; she stated her entire household became ill at one point. Patient reported that one of her ongoing AEs had resolved; she noted that her fatigue had improved and returned to baseline (grade 1). Patient's breast pain (grade 1) was ongoing, but an ultrasound on 05/15/22 was negative (presumed fat necrosis; will repeat ultrasound in ~3 months). Patient also reported several new AEs. Patient noted increased bruising (grade 1) that Dr Lindi Adie attributed to her ongoing treatment with blood thinners. She also reported a moderate urinary tract infection (grade 2) that was treated with oral antibiotics. See table below.  Patient returned remaining cycle 5 tucatinib/placebo and medication diary. She returned two bottles, one with 24  tablets remaining and one unopened bottle of 50 tablets (24 + 50 = 74; 100 total tablets dispensed - 74 remaining = 26 tablets taken). Medication diary reflects the same: 26 tablets taken; 13 total doses. Remaining tucatinib/placebo returned to pharmacy.  Cycle 6 tucatinib/placebo dispensed by pharmacy. Research RN verified that drug had correct patient name and study ID number. Drug was dispensed in two bottles of 50 '150mg'$  tablets, lot #[id number] (bottle 1: [id number], bottle 2: [id number]), exp 05/2023. Patient instructed to continue taking two tablets ('300mg'$  total) orally twice daily and to continue recording doses and side effects on medication diary. Reinforced importance of compliance with study medication; helped patient set alarms on her phone to remind her to take morning and evening doses. Patient verbalized understanding. Patient was given cycle 6 medication diary.   INFUSION: Patient received clinic standard pre-medications for T-DM1 treatment -- '650mg'$  acetaminophen PO and '50mg'$  diphenhydramine PO. PKs not required this cycle per protocol. Patient tolerated infusion well without complaint. Patient took morning dose of tucatinib/placebo at 1030 from cycle 6 supply.   ADVERSE EVENT LOG: Study/Protocol: [facility] [id number] CompassHER2 Cycle 6   Baseline: anxiety (grade 2), fatigue (grade 1), herpes simplex virus, hypotension (grade 1), vascular access complication (grade 3). Not reportable (unrelated/unlikely to be related to study treatment and < grade 3): breast pain (grade 1), bruising (grade 1), urinary tract infection (grade 2).  Event (solicited vs other reportable) Grade CTCAE v5.0 Onset Date Resolved Date Attribution to Study Treatment Treatment Comments  Neutrophil count decreased 0 N/A N/A N/A N/A   Platelet count decreased 0 N/A N/A N/A N/A   Rash (maculo-papular) 0 N/A N/A N/A N/A   Epistaxis 0 N/A N/A N/A N/A    Diarrhea 1 05/21/22 05/27/22 Unrelated  N/A Patient reported a baseline of 2-3  stools/day.Possible GI bug; symptoms resolved spontaneously.  Nausea 1 05/10/22 05/12/22 Possible Ondansetron Patient reported mild nausea x3 days after T-DM1 infusion.  Nausea 2 05/21/22 05/27/22 Unrelated N/A Possible GI bug; symptoms resolved spontaneously.  Vomiting 0 N/A N/A N/A N/A   Bilirubin increased 0 N/A N/A N/A N/A    AST increased 0 N/A N/A N/A N/A    ALT increased 0 N/A N/A N/A N/A    Anemia 0 N/A N/A N/A N/A    Mucositis oral 0 N/A N/A N/A N/A    Palmar-plantar erythrodysesthesia 0 N/A N/A N/A N/A    Peripheral neuropathy -- motor 0 N/A N/A N/A N/A    Peripheral neuropathy -- sensory 0 N/A N/A N/A N/A    Anorexia 2 05/10/22 05/12/22 Probable N/A Patient reported altered oral intake due to loss of appetite x3 days after T-DM1 infusion.  Fatigue 2 04/20/22 05/14/22 Probable Vitamin B12 Patient reported return to baseline (grade 1).   PLAN: Next treatment visit is 06/21/22. Visit will include labs, a provider visit, and an infusion appointment. Patient reminded to bring remaining tucatinib/placebo tablets and cycle 6 medication diary to C7D1 appointment. Patient verbalized understanding. Research will meet with patient and accompany her to appointments on C7D1. Patient due for study-required ECHO; scheduled for 06/14/22 (window 9/5-9/19/23). Patient thanked for her time and continued voluntary participation in this study. Patient has been provided direct contact information and is encouraged to contact this nurse for any questions or concerns.  Vickii Penna, RN, BSN, CPN Clinical Research Nurse I [phone]  05/31/2022 11:10 AM

## 2022-05-31 NOTE — Assessment & Plan Note (Signed)
Palpable lump in the right breast. Diagnostic mammogram and [place]: a 3.8 cm mass at the retroareolar right breast, 1 prominent right axillary lymph node with a 4 mm cortex, 1 other which is borderline measuring 3 mm. 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 3 o'clock position: Biopsy fibroadenoma  Treatment plan: 1. Neoadjuvant chemotherapy with TCH Perjeta 6 cycles followed by Herceptin Perjeta maintenance versus Kadcyla maintenance (based on response to neoadjuvant chemo) for 1 year 2.Bilateral Mastectomies: Left mastectomy: Benign Right mastectomy: Multifocal ILC 1.8 cm, 0.9 cm, margins negative, 1/2 lymph nodes positive, ER 50%, PR 30%, HER2 1+ IHC 3.Compass HER2 clinical trial: Randomization between Kadcyla versus Kadcyla plus Tucatinib 4.Followed by adjuvant radiation therapy 5.Followed by antiestrogen therapy  ------------------------------------------------------------------------------------------------------------------------ Current treatment: Kadcylacycle45mintenance (Compass HER2 clinical trial)+ Tucatinib vs Placebo Echocardiogram March 2023: EF 55 to 60%  Hospitalization 01/22/2022-01/26/2022 abscess/cellulitis required removal of the implant The plan is now a delayed reconstruction  Adverse Effects: Tolerating it well 1. Mild diarrhea: subsided 2. Headaches: Resolved  Worsening fatigue: We will administer B12 injections to see if it would help her fatigue issues.  We will try to get it authorized.  If he cannot get it authorized she will start taking sublingual B12 5000 mcg dose.  Skin nodule in the middle of the left reconstructed breast: Will obtain ultrasound for further evaluation. Radiation: 02/23/22- 04[facility]13[facility]22 [facility] Return to clinic every 3 weeks for [employer]

## 2022-06-04 ENCOUNTER — Telehealth: Payer: Self-pay | Admitting: Hematology and Oncology

## 2022-06-04 NOTE — Telephone Encounter (Signed)
Scheduled appointment per 8/24 los. Left voicemail.

## 2022-06-13 ENCOUNTER — Telehealth: Payer: Self-pay

## 2022-06-13 NOTE — Telephone Encounter (Signed)
CompassHER2 Residual Disease, A Double-Blinded, Phase III Randomized Trial of T-DM1 and Placebo Compared with T-DMI and Tucatinib  Received call from patient this afternoon requesting scheduling assistance for non-protocol related visits. Appointments adjusted as requested.   Inquired about patient's CompassHER2 medication compliance since helping her set alarms to remind her to take study drug. Patient stated she was doing "the best she had the entire time." Will see patient at her next visit, C7D1, on 9/14.  Vickii Penna, RN, BSN, CPN Clinical Research Nurse I [phone]  06/13/2022 3:41 PM

## 2022-06-14 ENCOUNTER — Ambulatory Visit (HOSPITAL_BASED_OUTPATIENT_CLINIC_OR_DEPARTMENT_OTHER)
Admission: RE | Admit: 2022-06-14 | Discharge: 2022-06-14 | Disposition: A | Discharge status: Home / Self Care | Payer: 59 | Source: Ambulatory Visit | Attending: Hematology and Oncology | Admitting: Hematology and Oncology

## 2022-06-14 ENCOUNTER — Other Ambulatory Visit: Payer: Self-pay

## 2022-06-14 ENCOUNTER — Inpatient Hospital Stay: Payer: 59 | Attending: Hematology and Oncology

## 2022-06-14 DIAGNOSIS — Z5111 Encounter for antineoplastic chemotherapy: Secondary | ICD-10-CM | POA: Diagnosis present

## 2022-06-14 DIAGNOSIS — Z006 Encounter for examination for normal comparison and control in clinical research program: Secondary | ICD-10-CM | POA: Insufficient documentation

## 2022-06-14 DIAGNOSIS — C50411 Malignant neoplasm of upper-outer quadrant of right female breast: Secondary | ICD-10-CM | POA: Insufficient documentation

## 2022-06-14 DIAGNOSIS — Z170 Estrogen receptor positive status [ER+]: Secondary | ICD-10-CM

## 2022-06-14 DIAGNOSIS — Z5181 Encounter for therapeutic drug level monitoring: Secondary | ICD-10-CM

## 2022-06-14 DIAGNOSIS — Z95828 Presence of other vascular implants and grafts: Secondary | ICD-10-CM

## 2022-06-14 DIAGNOSIS — Z79899 Other long term (current) drug therapy: Secondary | ICD-10-CM

## 2022-06-14 DIAGNOSIS — Z0189 Encounter for other specified special examinations: Secondary | ICD-10-CM | POA: Diagnosis not present

## 2022-06-14 DIAGNOSIS — R5383 Other fatigue: Secondary | ICD-10-CM | POA: Diagnosis not present

## 2022-06-14 LAB — ECHOCARDIOGRAM COMPLETE
AR max vel: 2.79 cm2
AV Peak grad: 5.9 mmHg
Ao pk vel: 1.21 m/s
Area-P 1/2: 4.01 cm2
S' Lateral: 3.25 cm

## 2022-06-14 MED ORDER — CYANOCOBALAMIN 1000 MCG/ML IJ SOLN
1000.0000 ug | Freq: Once | INTRAMUSCULAR | Status: AC
  Administered 2022-06-14: 1000 ug via INTRAMUSCULAR
  Filled 2022-06-14: qty 1

## 2022-06-14 MED ORDER — GOSERELIN ACETATE 3.6 MG ~~LOC~~ IMPL
3.6000 mg | DRUG_IMPLANT | Freq: Once | SUBCUTANEOUS | Status: AC
  Administered 2022-06-14: 3.6 mg via SUBCUTANEOUS
  Filled 2022-06-14: qty 3.6

## 2022-06-18 NOTE — Progress Notes (Signed)
Patient Care Team: Dian Queen, MD as PCP - General (Obstetrics and Gynecology) Dian Queen, MD (Obstetrics and Gynecology) Mauro Kaufmann, RN as Oncology Nurse Navigator Carlynn Spry, Charlott Holler, RN as Oncology Nurse Navigator Nicholas Lose, MD as Consulting Physician (Hematology and Oncology) Gery Pray, MD as Consulting Physician (Radiation Oncology) Rolm Bookbinder, MD as Consulting Physician (General Surgery)  DIAGNOSIS:  Encounter Diagnosis  Name Primary?   Malignant neoplasm of upper-outer quadrant of right breast in female, estrogen receptor positive ([place])     SUMMARY OF ONCOLOGIC HISTORY: Oncology History  Malignant neoplasm of upper-outer quadrant of right breast in female, estrogen receptor positive ([place])  06/23/2021 Initial Diagnosis   Palpable lump in the right breast. Diagnostic mammogram and [place]: a 3.8 cm mass at the retroareolar right breast, 1 prominent right axillary lymph node with a 4 mm cortex, 1 other which is borderline measuring 3 mm. 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  3 o'clock position: Biopsy fibroadenoma   07/12/2021 -  Neo-Adjuvant Chemotherapy   Neoadjuvant chemotherapy with TCH Perjeta 6 cycles followed by Herceptin Perjeta maintenance versus Kadcyla maintenance (based on response to neoadjuvant chemo) for 1 year   07/28/2021 Genetic Testing   Negative hereditary cancer genetic testing: no pathogenic variants detected in [facility] CancerNext-Expanded +RNAinsight Panel.  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  The report date is July 28, 2021.   The CancerNext-Expanded gene panel offered by [facility] and includes sequencing, rearrangement, and RNA analysis for the following 77 genes: AIP, ALK, APC, ATM, AXIN2, BAP1, BARD1, BLM, BMPR1A, BRCA1, BRCA2, BRIP1, CDC73, CDH1, CDK4, CDKN1B, CDKN2A, CHEK2, CTNNA1, DICER1, FANCC, FH,  FLCN, GALNT12, KIF1B, LZTR1, MAX, MEN1, MET, MLH1, MSH2, MSH3, MSH6, MUTYH, NBN, NF1, NF2, NTHL1, PALB2, PHOX2B, PMS2, POT1, PRKAR1A, PTCH1, PTEN, RAD51C, RAD51D, RB1, RECQL, RET, SDHA, SDHAF2, SDHB, SDHC, SDHD, SMAD4, SMARCA4, SMARCB1, SMARCE1, STK11, SUFU, TMEM127, TP53, TSC1, TSC2, VHL and XRCC2 (sequencing and deletion/duplication); EGFR, EGLN1, HOXB13, KIT, MITF, PDGFRA, POLD1, and POLE (sequencing only); EPCAM and GREM1 (deletion/duplication only).    12/06/2021 Surgery   Bilateral Mastectomies: Left mastectomy: Benign Right mastectomy: Multifocal ILC 1.8 cm, 0.9 cm, margins negative, 1/2 lymph nodes positive, ER 50%, PR 30%, HER2 1+ IHC   12/26/2021 - 12/29/2021 Chemotherapy   Patient is on Treatment Plan : BREAST ADO-Trastuzumab Emtansine (Kadcyla) q21d       CHIEF COMPLIANT: Follow-up of right breast cancer on Kadcyla.    INTERVAL HISTORY: Jenna Cross is a 36 y.o with the above mention. She presents to the clinic today for a follow-up. She states that she has some mild numbness and tingling in her right arm. She reports it's not painful and not constant. Denies in the leg. Denies diarrhea and vomiting. Sometimes she doesn't have an appetite.   ALLERGIES:  is allergic to wound dressings.  MEDICATIONS:  Current Outpatient Medications  Medication Sig Dispense Refill   acetaminophen (TYLENOL) 500 MG tablet Take 1,000 mg by mouth every 6 (six) hours as needed for mild pain or headache.     ALPRAZolam (XANAX) 0.5 MG tablet Take 0.5 mg by mouth 3 (three) times daily as needed for anxiety.     buPROPion (WELLBUTRIN XL) 150 MG 24 hr tablet Take 150 mg by mouth daily.     cephALEXin (KEFLEX) 500 MG capsule TAKE 1 CAPSULE (500 MG TOTAL) BY MOUTH 4 TIMES A DAY FOR 3 DAYS     dexamethasone (DECADRON) 4 MG  tablet TAKE 1 TABLET DAILY. TAKE 1 TABLET DAY BEFORE CHEMO AND 1 TABLET DAY AFTER CHEMO WITH FOOD     diphenoxylate-atropine (LOMOTIL) 2.5-0.025 MG tablet Take 1 tablet by mouth 3 (three)  times daily as needed for diarrhea or loose stools. 30 tablet 3   enoxaparin (LOVENOX) 60 MG/0.6ML injection INJECT 0.6 MLS (60 MG TOTAL) INTO THE SKIN EVERY 12 (TWELVE) HOURS.     Goserelin Acetate (ZOLADEX [place])      hydrocortisone cream 0.5 % Apply 1 Application topically as needed for itching.     Investigational tucatanib/placebo 150 MG tablet (ONT-380,NSC #[id number]) [facility] [id number] Take 2 tablets (300 mg total) 2 (two) times dailyfor 21 days.  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 Tucatinib or placebo should take orally twice daily with or without food, approximately 12 hours apart between doses. Tablets must be swallowed whole and may not be crushed, chewed or dissolved in liquid. CAUTION: Chemotherapy/Biotherapy 84 tablet 0   letrozole (FEMARA) 2.5 MG tablet Take 1 tablet (2.5 mg total) by mouth daily. 90 tablet 3   lidocaine-prilocaine (EMLA) cream lidocaine-prilocaine 2.5 %-2.5 % topical cream  APPLY TO AFFECTED AREA ONCE AS DIRECTED     loratadine (CLARITIN) 10 MG tablet Take 10 mg by mouth daily as needed for allergies.     LORazepam (ATIVAN) 0.5 MG tablet TAKE 1 TABLET (0.5 MG TOTAL) BY MOUTH AT BEDTIME AS NEEDED (NAUSEA OR VOMITING).     ondansetron (ZOFRAN) 8 MG tablet Take 1 tablet (8 mg total) by mouth every 8 (eight) hours as needed for nausea. 30 tablet 3   predniSONE (STERAPRED UNI-PAK 21 TAB) 10 MG (21) TBPK tablet 6 day taper; take as directed on package instructions 21 tablet 0   valACYclovir (VALTREX) 500 MG tablet Take 500 mg by mouth every evening.     venlafaxine XR (EFFEXOR-XR) 37.5 MG 24 hr capsule Take 1 capsule (37.5 mg total) by mouth daily with breakfast. 90 capsule 1   XARELTO 20 MG TABS tablet TAKE 1 TABLET BY MOUTH DAILY WITH SUPPER. 30 tablet 5   No current facility-administered medications for this visit.   Facility-Administered Medications Ordered in Other Visits  Medication Dose Route Frequency Provider Last Rate Last Admin   sodium chloride flush (NS) 0.9 % injection 10 mL   10 mL Intracatheter PRN Nicholas Lose, MD   10 mL at 06/21/22 1143    PHYSICAL EXAMINATION: ECOG PERFORMANCE STATUS: 1 - Symptomatic but completely ambulatory  Vitals:   06/21/22 0923  BP: 131/70  Pulse: 90  Resp: 18  Temp: (!) 97.3 F (36.3 C)  SpO2: 100%   Filed Weights   06/21/22 0923  Weight: 134 lb 14.4 oz (61.2 kg)     LABORATORY DATA:  I have reviewed the data as listed    Latest Ref Rng & Units 06/21/2022    9:08 AM 05/31/2022    9:05 AM 05/09/2022    8:44 AM  CMP  Glucose 70 - 99 mg/dL 106  92  100   BUN 6 - 20 mg/dL 10  12  12    Creatinine 0.44 - 1.00 mg/dL 0.87  0.62  0.85   Sodium 135 - 145 mmol/L 139  140  139   Potassium 3.5 - 5.1 mmol/L 3.7  3.6  3.7   Chloride 98 - 111 mmol/L 106  107  104   CO2 22 - 32 mmol/L 28  30  28    Calcium 8.9 - 10.3 mg/dL 9.4  9.3  9.3   Total  Protein 6.5 - 8.1 g/dL 6.9  6.8  7.5   Total Bilirubin 0.3 - 1.2 mg/dL 0.5  0.4  0.6   Alkaline Phos 38 - 126 U/L 105  116  109   AST 15 - 41 U/L 28  21  27    ALT 0 - 44 U/L 28  16  21      Lab Results  Component Value Date   WBC 4.6 06/21/2022   HGB 13.0 06/21/2022   HCT 39.3 06/21/2022   MCV 92.9 06/21/2022   PLT 190 06/21/2022   NEUTROABS 2.6 06/21/2022    ASSESSMENT & PLAN:  Malignant neoplasm of upper-outer quadrant of right breast in female, estrogen receptor positive (HCC) Palpable lump in the right breast. Diagnostic mammogram and [place]: a 3.8 cm mass at the retroareolar right breast, 1 prominent right axillary lymph node with a 4 mm cortex, 1 other which is borderline measuring 3 mm. 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  3 o'clock position: Biopsy fibroadenoma   Treatment plan: 1. Neoadjuvant chemotherapy with TCH Perjeta 6 cycles followed by Herceptin Perjeta maintenance versus Kadcyla maintenance (based on response to neoadjuvant chemo) for 1 year 2. Bilateral Mastectomies: Left mastectomy:  Benign Right mastectomy: Multifocal ILC 1.8 cm, 0.9 cm, margins negative, 1/2 lymph nodes positive, ER 50%, PR 30%, HER2 1+ IHC 3.  Compass HER2 clinical trial: Randomization between Kadcyla versus Kadcyla plus Tucatinib 4. Followed by adjuvant radiation therapy  5.  Followed by antiestrogen therapy   ------------------------------------------------------------------------------------------------------------------------ Current treatment: Kadcyla cycle 8 maintenance (Compass HER2 clinical trial) + Tucatinib vs Placebo Echocardiogram March 2023: EF 55 to 60%   Hospitalization 01/22/2022-01/26/2022 abscess/cellulitis required removal of the implant The plan is now a delayed reconstruction   Adverse Effects: Tolerating it well 1. Mild diarrhea: subsided 2. Headaches: Resolved   Worsening fatigue: We will administer B12 injections to see if it would help her fatigue issues.  We will try to get it authorized.  If he cannot get it authorized she will start taking sublingual B12 5000 mcg dose.   Skin nodule in the middle of the left reconstructed breast: Ultrasound revealed that it was fat necrosis Radiation: 02/23/22- 04/13/22  Noncompliance with Tucatinib: Patient was taking only 30% of the time previously now she is taking up to 60% of the time.  She assured her that she will start taking it very regularly from now on.  The issue is that because it needs to be refrigerated she forgets that many times.  Return to clinic every 3 weeks for Kadcyla    No orders of the defined types were placed in this encounter.  The patient has a good understanding of the overall plan. she agrees with it. she will call with any problems that may develop before the next visit here. Total time spent: 30 mins including face to face time and time spent for planning, charting and co-ordination of care   Harriette Ohara, MD 06/21/22    I Gardiner Coins am scribing for Dr. Lindi Adie  I have reviewed the above  documentation for accuracy and completeness, and I agree with the above.

## 2022-06-19 ENCOUNTER — Ambulatory Visit (INDEPENDENT_AMBULATORY_CARE_PROVIDER_SITE_OTHER): Payer: 59 | Admitting: Plastic Surgery

## 2022-06-19 ENCOUNTER — Encounter: Payer: Self-pay | Admitting: Plastic Surgery

## 2022-06-19 DIAGNOSIS — Z170 Estrogen receptor positive status [ER+]: Secondary | ICD-10-CM | POA: Diagnosis not present

## 2022-06-19 DIAGNOSIS — Z923 Personal history of irradiation: Secondary | ICD-10-CM

## 2022-06-19 DIAGNOSIS — Z9013 Acquired absence of bilateral breasts and nipples: Secondary | ICD-10-CM

## 2022-06-19 DIAGNOSIS — N651 Disproportion of reconstructed breast: Secondary | ICD-10-CM | POA: Diagnosis not present

## 2022-06-19 DIAGNOSIS — C50411 Malignant neoplasm of upper-outer quadrant of right female breast: Secondary | ICD-10-CM

## 2022-06-19 DIAGNOSIS — N63 Unspecified lump in unspecified breast: Secondary | ICD-10-CM

## 2022-06-19 DIAGNOSIS — I82622 Acute embolism and thrombosis of deep veins of left upper extremity: Secondary | ICD-10-CM

## 2022-06-19 NOTE — Progress Notes (Addendum)
Subjective:    Patient ID: Jenna Cross, female    DOB: 09-06-1986, 36 y.o.   MRN: [id number]  Patient is a lovely 36 year old female here for follow-up on her breast reconstruction.  She underwent bilateral mastectomies with immediate reconstruction with acellular dermal matrix and tissue expanders on December 06, 2021.  She then had exchange to implants on March 27 with [place] smooth round ultrahigh profile 455 cc implants.  These were not subpectoralis placed implants.  The patient had an infection 1 month later and underwent removal of the right breast implant.  She has now finished her radiation which ended in July.  She has no reconstruction on the right side.  The left side is intact.  Today we discussed the options for right-sided reconstruction which included a Diep flap and a latissimus flap.  Due to the radiation she will need a flap.  I gave her the information for [facility]  She is going to think about it but is leaning more towards a latissimus flap.  Because the left implant is above the muscle she has a lot of ptosis.  She does recall me talking about this ahead of time as one of the downsides of the prepectoralis approach.      Review of Systems  Constitutional: Negative.   HENT: Negative.    Eyes: Negative.   Respiratory: Negative.  Negative for chest tightness.   Cardiovascular: Negative.   Gastrointestinal: Negative.   Endocrine: Negative.   Genitourinary: Negative.        Objective:   Physical Exam Constitutional:      Appearance: Normal appearance.  HENT:     Head: Normocephalic and atraumatic.  Cardiovascular:     Rate and Rhythm: Normal rate.     Pulses: Normal pulses.  Pulmonary:     Effort: Pulmonary effort is normal.  Musculoskeletal:        General: No swelling or deformity.  Skin:    General: Skin is warm.     Capillary Refill: Capillary refill takes less than 2 seconds.     Coloration: Skin is not jaundiced.     Findings: No bruising.   Neurological:     Mental Status: She is alert and oriented to person, place, and time.  Psychiatric:        Mood and Affect: Mood normal.        Behavior: Behavior normal.        Thought Content: Thought content normal.        Judgment: Judgment normal.         Assessment & Plan:     ICD-10-CM   1. Malignant neoplasm of upper-outer quadrant of right breast in female, estrogen receptor positive ([place])  C50.411    Z17.0     2. Mass of breast, unspecified laterality  N63.0     3. Acute deep vein thrombosis (DVT) of other vein of left upper extremity (HCC)  I82.622     4. Breast asymmetry following reconstructive surgery  N65.1     5. Acquired absence of breast and absent nipple, bilateral  Z90.13       Pictures were obtained of the patient and placed in the chart with the patient's or guardian's permission.  We will plan to talk within 1 to 2 weeks.  The patient is leaning towards right latissimus flap with expander placement and left exchange of implant for expander under the muscle.  She understands the timeframe that this will take.  I do  want her to think it over and we will talk again.

## 2022-06-21 ENCOUNTER — Other Ambulatory Visit: Payer: Self-pay

## 2022-06-21 ENCOUNTER — Inpatient Hospital Stay: Payer: 59

## 2022-06-21 ENCOUNTER — Inpatient Hospital Stay (HOSPITAL_BASED_OUTPATIENT_CLINIC_OR_DEPARTMENT_OTHER): Payer: 59 | Admitting: Hematology and Oncology

## 2022-06-21 ENCOUNTER — Encounter: Payer: Self-pay | Admitting: *Deleted

## 2022-06-21 VITALS — BP 109/67 | HR 99 | Temp 98.2°F | Resp 20

## 2022-06-21 DIAGNOSIS — C50411 Malignant neoplasm of upper-outer quadrant of right female breast: Secondary | ICD-10-CM

## 2022-06-21 DIAGNOSIS — Z170 Estrogen receptor positive status [ER+]: Secondary | ICD-10-CM | POA: Diagnosis not present

## 2022-06-21 DIAGNOSIS — Z5111 Encounter for antineoplastic chemotherapy: Secondary | ICD-10-CM | POA: Diagnosis not present

## 2022-06-21 DIAGNOSIS — Z95828 Presence of other vascular implants and grafts: Secondary | ICD-10-CM

## 2022-06-21 DIAGNOSIS — Z006 Encounter for examination for normal comparison and control in clinical research program: Secondary | ICD-10-CM | POA: Diagnosis not present

## 2022-06-21 LAB — CBC WITH DIFFERENTIAL (CANCER CENTER ONLY)
Abs Immature Granulocytes: 0.02 10*3/uL (ref 0.00–0.07)
Basophils Absolute: 0.1 10*3/uL (ref 0.0–0.1)
Basophils Relative: 1 %
Eosinophils Absolute: 0.3 10*3/uL (ref 0.0–0.5)
Eosinophils Relative: 6 %
HCT: 39.3 % (ref 36.0–46.0)
Hemoglobin: 13 g/dL (ref 12.0–15.0)
Immature Granulocytes: 0 %
Lymphocytes Relative: 28 %
Lymphs Abs: 1.3 10*3/uL (ref 0.7–4.0)
MCH: 30.7 pg (ref 26.0–34.0)
MCHC: 33.1 g/dL (ref 30.0–36.0)
MCV: 92.9 fL (ref 80.0–100.0)
Monocytes Absolute: 0.4 10*3/uL (ref 0.1–1.0)
Monocytes Relative: 8 %
Neutro Abs: 2.6 10*3/uL (ref 1.7–7.7)
Neutrophils Relative %: 57 %
Platelet Count: 190 10*3/uL (ref 150–400)
RBC: 4.23 MIL/uL (ref 3.87–5.11)
RDW: 13.6 % (ref 11.5–15.5)
WBC Count: 4.6 10*3/uL (ref 4.0–10.5)
nRBC: 0 % (ref 0.0–0.2)

## 2022-06-21 LAB — CMP (CANCER CENTER ONLY)
ALT: 28 U/L (ref 0–44)
AST: 28 U/L (ref 15–41)
Albumin: 4.1 g/dL (ref 3.5–5.0)
Alkaline Phosphatase: 105 U/L (ref 38–126)
Anion gap: 5 (ref 5–15)
BUN: 10 mg/dL (ref 6–20)
CO2: 28 mmol/L (ref 22–32)
Calcium: 9.4 mg/dL (ref 8.9–10.3)
Chloride: 106 mmol/L (ref 98–111)
Creatinine: 0.87 mg/dL (ref 0.44–1.00)
GFR, Estimated: >60 mL/min (ref >60)
Glucose, Bld: 106 mg/dL — ABNORMAL HIGH (ref 70–99)
Potassium: 3.7 mmol/L (ref 3.5–5.1)
Sodium: 139 mmol/L (ref 135–145)
Total Bilirubin: 0.5 mg/dL (ref 0.3–1.2)
Total Protein: 6.9 g/dL (ref 6.5–8.1)

## 2022-06-21 LAB — PREGNANCY, URINE: Preg Test, Ur: NEGATIVE

## 2022-06-21 MED ORDER — SODIUM CHLORIDE 0.9% FLUSH
10.0000 mL | Freq: Once | INTRAVENOUS | Status: AC
  Administered 2022-06-21: 10 mL

## 2022-06-21 MED ORDER — HEPARIN SOD (PORK) LOCK FLUSH 100 UNIT/ML IV SOLN
500.0000 [IU] | Freq: Once | INTRAVENOUS | Status: AC | PRN
  Administered 2022-06-21: 500 [IU]

## 2022-06-21 MED ORDER — SODIUM CHLORIDE 0.9% FLUSH
10.0000 mL | INTRAVENOUS | Status: DC | PRN
  Administered 2022-06-21: 10 mL

## 2022-06-21 MED ORDER — INV-TUCATINIB/PLACEBO 150 MG TABLET ALLIANCE A011801: 300.0000 mg | ORAL_TABLET | Freq: Two times a day (BID) | ORAL | 0 refills | Status: AC

## 2022-06-21 MED ORDER — SODIUM CHLORIDE 0.9 % IV SOLN
3.6000 mg/kg | Freq: Once | INTRAVENOUS | Status: AC
  Administered 2022-06-21: 220 mg via INTRAVENOUS
  Filled 2022-06-21: qty 8

## 2022-06-21 MED ORDER — DIPHENHYDRAMINE HCL 25 MG PO CAPS
50.0000 mg | ORAL_CAPSULE | Freq: Once | ORAL | Status: AC
  Administered 2022-06-21: 50 mg via ORAL
  Filled 2022-06-21: qty 2

## 2022-06-21 MED ORDER — SODIUM CHLORIDE 0.9 % IV SOLN: Freq: Once | INTRAVENOUS | Status: AC

## 2022-06-21 MED ORDER — ACETAMINOPHEN 325 MG PO TABS
650.0000 mg | ORAL_TABLET | Freq: Once | ORAL | Status: AC
  Administered 2022-06-21: 650 mg via ORAL
  Filled 2022-06-21: qty 2

## 2022-06-21 MED ORDER — ONDANSETRON HCL 4 MG/2ML IJ SOLN
8.0000 mg | Freq: Once | INTRAMUSCULAR | Status: AC
  Administered 2022-06-21: 8 mg via INTRAVENOUS
  Filled 2022-06-21: qty 4

## 2022-06-21 NOTE — Progress Notes (Signed)
Pt. states she has been tolerating treatment without any issues. Pt. declines to stay for 30 minute post observation. Vital signs stable, no respiratory distress noted, and pt. left via ambulation.

## 2022-06-21 NOTE — Research (Unsigned)
CompassHER2 Residual Disease, A Double-Blinded, Phase III Randomized Trial of T-DM1 and Placebo Compared with T-DMI and Tucatinib  Patient presented to clinic unaccompanied this morning for cycle 7 day 1.  Echo: Completed on 06/14/22.  LABS: CBC and CMP collected via port-a-cath per consent and study protocol. Patient tolerated well without complaint. Urine pregnancy collected per institutional guidelines.   VITAL SIGNS: Completed, including weight. See vital signs flowsheet.   H&P: Completed by Dr Lindi Adie. See clinic note dated 06/21/22. Provider reviewed lab and Echo results, cleared patient to continue treatment.  Dr. Lindi Adie states elevated glucose is not clinically significant and this was not a fasting glucose.   RESEARCH: Concomitant medications reviewed.  Patient reports she is taking letrozole as prescribed and denies any new medications.   Solicited and other adverse events assessed per protocol. Patient reported that she had mild nausea (grade 1) and mild anorexia (grade 1) for three days following her T-DM1 infusion. Neither affected her ability to eat or drink. She took Zofran once. Symptoms resolved after the 3 days. Patient reports she started to notice some mild tingling and numbness in right forearm and hand. It is intermittent and started since her last treatment. She says it feels like her arm is asleep at times and denies any discomfort. Dr. Lindi Adie states Sensory neuropathy (grade 1) Patient's breast pain (grade 1) was ongoing, described as occasional/intermittent sharp shooting pains in right chest.  Patient noted increased bruising (grade 1) ongoing. See table below.  Patient returned remaining cycle 6 tucatinib/placebo and medication diary. She returned two bottles, one with 2 tablets remaining and one unopened bottle of 50 tablets (2 + 50 = 52; 100 total tablets dispensed - 52 remaining = 48 tablets taken). Medication diary reflects the same: 48 tablets taken; 24 total doses.  Remaining tucatinib/placebo returned to pharmacy.  Cycle 7 tucatinib/placebo dispensed by pharmacy. Research RN verified that drug had correct patient name and study ID number.   Drug was dispensed in two bottles of 50 '150mg'$  tablets, lot #[id number], Rx #[id number], exp 05/2023. Patient instructed to continue taking two tablets ('300mg'$  total) orally twice daily and to continue recording doses and side effects on medication diary. Reinforced importance of compliance with study medication; patient reports the alarms on her phone are helping her and attributes her missed doses to being on vacation.  Patient also states having 2 small children and having to keep study drug refrigerated also contribute to her missing doses. Explored other strategies to improve compliance. Patient states she thinks the alarms are effective and she is determined to improve this cycle. She may also put all her other meds in the refrigerator along with her study drug so they are all in one place. Patient was given cycle 7 medication diary.   INFUSION: Patient received clinic standard pre-medications for T-DM1 treatment -- '650mg'$  acetaminophen PO and '50mg'$  diphenhydramine PO. PKs not required this cycle per protocol. Patient tolerated infusion well without complaint. Patient took morning dose of tucatinib/placebo at 10:30 am from cycle 7 supply.   ADVERSE EVENT LOG: Study/Protocol: [facility] [id number] CompassHER2 Cycle 7  Baseline: anxiety (grade 2), fatigue (grade 1), herpes simplex virus, hypotension (grade 1), vascular access complication (grade 3). Not reportable (unrelated/unlikely to be related to study treatment and < grade 3): breast pain (grade 1), bruising (grade 1), urinary tract infection (grade 2).  Event (solicited vs other reportable) Grade CTCAE v5.0 Onset Date Resolved Date Attribution to Study Treatment Treatment Comments  Neutrophil count decreased 0 N/A N/A N/A  N/A   Platelet count decreased 0 N/A N/A N/A N/A   Rash  (maculo-papular) 0 N/A N/A N/A N/A   Epistaxis 0 N/A N/A N/A N/A    Diarrhea 0 N/A N/A N/A N/A Patient reported a baseline of 2-3 stools/day.  Nausea 1 06/01/22 06/04/22 Possible Ondansetron Patient reported mild nausea x3 days after T-DM1 infusion.  Vomiting 0 N/A N/A N/A N/A   Bilirubin increased 0 N/A N/A N/A N/A    AST increased 0 N/A N/A N/A N/A    ALT increased 0 N/A N/A N/A N/A    Anemia 0 N/A N/A N/A N/A    Mucositis oral 0 N/A N/A N/A N/A    Palmar-plantar erythrodysesthesia 0 N/A N/A N/A N/A    Peripheral neuropathy -- motor 0 N/A N/A N/A N/A    Peripheral neuropathy -- sensory 1 06/01/22 ongoing    Intermittent  Anorexia 1 06/01/22 06/04/22 Probable N/A Patient reported loss of appetite x3 days after T-DM1 infusion.   PLAN: Next treatment visit is scheduled for 07/522. Visit will include labs, a provider visit, and an infusion appointment. Patient reminded to bring remaining tucatinib/placebo tablets and cycle 7 medication diary to C8D1 appointment. Patient verbalized understanding. Research will meet with patient and accompany her to appointments on C8D1. Patient thanked for her time and continued voluntary participation in this study. Patient has been provided direct contact information and is encouraged to contact this nurse for any questions or concerns. She verbalized understanding.  Foye Spurling, BSN, RN, [place] Nurse II 06/21/2022

## 2022-06-21 NOTE — Patient Instructions (Signed)
[facility]  Discharge Instructions: Thank you for choosing [facility] to provide your oncology and hematology care.   If you have a lab appointment with the [facility], please go directly to the [facility] and check in at the registration area.   Wear comfortable clothing and clothing appropriate for easy access to any Portacath or PICC line.   We strive to give you quality time with your provider. You may need to reschedule your appointment if you arrive late (15 or more minutes).  Arriving late affects you and other patients whose appointments are after yours.  Also, if you miss three or more appointments without notifying the office, you may be dismissed from the clinic at the provider's discretion.      For prescription refill requests, have your pharmacy contact our office and allow 72 hours for refills to be completed.    Today you received the following chemotherapy and/or immunotherapy agent: Trastuzumab (Kadcyla)   To help prevent nausea and vomiting after your treatment, we encourage you to take your nausea medication as directed.  BELOW ARE SYMPTOMS THAT SHOULD BE REPORTED IMMEDIATELY: *FEVER GREATER THAN 100.4 F (38 C) OR HIGHER *CHILLS OR SWEATING *NAUSEA AND VOMITING THAT IS NOT CONTROLLED WITH YOUR NAUSEA MEDICATION *UNUSUAL SHORTNESS OF BREATH *UNUSUAL BRUISING OR BLEEDING *URINARY PROBLEMS (pain or burning when urinating, or frequent urination) *BOWEL PROBLEMS (unusual diarrhea, constipation, pain near the anus) TENDERNESS IN MOUTH AND THROAT WITH OR WITHOUT PRESENCE OF ULCERS (sore throat, sores in mouth, or a toothache) UNUSUAL RASH, SWELLING OR PAIN  UNUSUAL VAGINAL DISCHARGE OR ITCHING   Items with * indicate a potential emergency and should be followed up as soon as possible or go to the Emergency Department if any problems should occur.  Please show the CHEMOTHERAPY ALERT CARD or IMMUNOTHERAPY ALERT CARD at  check-in to the Emergency Department and triage nurse.  Should you have questions after your visit or need to cancel or reschedule your appointment, please contact [facility]  Dept: [phone]  and follow the prompts.  Office hours are 8:00 a.m. to 4:30 p.m. Monday - Friday. Please note that voicemails left after 4:00 p.m. may not be returned until the following business day.  We are closed weekends and major holidays. You have access to a nurse at all times for urgent questions. Please call the main number to the clinic Dept: [phone] and follow the prompts.   For any non-urgent questions, you may also contact your provider using MyChart. We now offer e-Visits for anyone 6 and older to request care online for non-urgent symptoms. For details visit [email or web address].   Also download the MyChart app! Go to the app store, search "MyChart", open the app, select [facility], and log in with your MyChart username and password.  Masks are optional in the cancer centers. If you would like for your care team to wear a mask while they are taking care of you, please let them know. You may have one support person who is at least 37 years old accompany you for your appointments. Ado-Trastuzumab Emtansine Injection What is this medication? ADO-TRASTUZUMAB EMTANSINE (ADD oh traz TOO zuh mab em TAN zine) treats breast cancer. It works by blocking a protein that causes cancer cells to grow and multiply. This helps to slow or stop the spread of cancer cells. This medicine may be used for other purposes; ask your health care provider or pharmacist if you have  questions. COMMON BRAND NAME(S): Kadcyla What should I tell my care team before I take this medication? They need to know if you have any of these conditions: Heart failure Liver disease Low platelet levels Lung disease Tingling of the fingers or toes or other nerve disorder An unusual or allergic reaction to  ado-trastuzumab emtansine, other medications, foods, dyes, or preservatives Pregnant or trying to get pregnant Breast-feeding How should I use this medication? This medication is infused into a vein. It is given by your care team in a hospital or clinic setting. Talk to your care team about the use of this medication in children. Special care may be needed. Overdosage: If you think you have taken too much of this medicine contact a poison control center or emergency room at once. NOTE: This medicine is only for you. Do not share this medicine with others. What if I miss a dose? Keep appointments for follow-up doses. It is important not to miss your dose. Call your care team if you are unable to keep an appointment. What may interact with this medication? Atazanavir Boceprevir Clarithromycin Dalfopristin; quinupristin Delavirdine Indinavir Isoniazid, INH Itraconazole Ketoconazole Nefazodone Nelfinavir Ritonavir Telaprevir Telithromycin Tipranavir Voriconazole This list may not describe all possible interactions. Give your health care provider a list of all the medicines, herbs, non-prescription drugs, or dietary supplements you use. Also tell them if you smoke, drink alcohol, or use illegal drugs. Some items may interact with your medicine. What should I watch for while using this medication? This medication may make you feel generally unwell. This is not uncommon, as chemotherapy can affect healthy cells as well as cancer cells. Report any side effects. Continue your course of treatment even though you feel ill unless your care team tells you to stop. You may need blood work while taking this medication. This medication may increase your risk to bruise or bleed. Call your care team if you notice any unusual bleeding. Be careful brushing or flossing your teeth or using a toothpick because you may get an infection or bleed more easily. If you have any dental work done, tell your dentist  you are receiving this medication. Talk to your care team if you may be pregnant. Serious birth defects can occur if you take this medication during pregnancy and for 7 months after the last dose. You will need a negative pregnancy test before starting this medication. Contraception is recommended while taking this medication and for 7 months after the last dose. Your care team can help you find the option that works for you. If your partner can get pregnant, use a condom during sex while taking this medication and for 4 months after the last dose. Do not breastfeed while taking this medication and for 7 months after the last dose. This medication may cause infertility. Talk to your care team if you are concerned with your fertility. What side effects may I notice from receiving this medication? Side effects that you should report to your care team as soon as possible: Allergic reactions--skin rash, itching, hives, swelling of the face, lips, tongue, or throat Bleeding--bloody or black, tar-like stools, vomiting blood or brown material that looks like coffee grounds, red or dark brown urine, small red or purple spots on skin, unusual bruising or bleeding Dry cough, shortness of breath or trouble breathing Heart failure--shortness of breath, swelling of the ankles, feet, or hands, sudden weight gain, unusual weakness or fatigue Infusion reactions--chest pain, shortness of breath or trouble breathing, feeling faint or  lightheaded Liver injury--right upper belly pain, loss of appetite, nausea, light-colored stool, dark yellow or brown urine, yellowing skin or eyes, unusual weakness or fatigue Pain, tingling, or numbness in the hands or feet Painful swelling, warmth, or redness of the skin, blisters or sores at the infusion site Side effects that usually do not require medical attention (report to your care team if they continue or are bothersome): Constipation Fatigue Headache Muscle  pain Nausea This list may not describe all possible side effects. Call your doctor for medical advice about side effects. You may report side effects to FDA at 1-800-FDA-1088. Where should I keep my medication? This medication is given in a hospital or clinic. It will not be stored at home. NOTE: This sheet is a summary. It may not cover all possible information. If you have questions about this medicine, talk to your doctor, pharmacist, or health care provider.  2023 Elsevier/Gold Standard (2022-02-09 00:00:00)

## 2022-06-21 NOTE — Assessment & Plan Note (Signed)
Palpable lump in the right breast. Diagnostic mammogram and [place]: a 3.8 cm mass at the retroareolar right breast, 1 prominent right axillary lymph node with a 4 mm cortex, 1 other which is borderline measuring 3 mm. 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 3 o'clock position: Biopsy fibroadenoma  Treatment plan: 1. Neoadjuvant chemotherapy with TCH Perjeta 6 cycles followed by Herceptin Perjeta maintenance versus Kadcyla maintenance (based on response to neoadjuvant chemo) for 1 year 2.Bilateral Mastectomies: Left mastectomy: Benign Right mastectomy: Multifocal ILC 1.8 cm, 0.9 cm, margins negative, 1/2 lymph nodes positive, ER 50%, PR 30%, HER2 1+ IHC 3.[facility]ass HER2 clinical trial: Randomization between Kadcyla versus Kadcyla plus Tucatinib 4.Followed by adjuvant radiation therapy 5.Followed by antiestrogen therapy  ------------------------------------------------------------------------------------------------------------------------ Current treatment: Kadcylacycle68mintenance (Compass HER2 clinical trial)+ Tucatinib vs Placebo Echocardiogram March 2023: EF 55 to 60%  Hospitalization 01/22/2022-01/26/2022 abscess/cellulitis required removal of the implant The plan is now a delayed reconstruction  Adverse Effects: Tolerating it well 1. Mild diarrhea: subsided 2. Headaches: Resolved  Worsening fatigue: We will administer B12 injections to see if it would help her fatigue issues. We will try to get it authorized. If he cannot get it authorized she will start taking sublingual B12 5000 mcg dose.  Skin nodule in the middle of the left reconstructed breast: Ultrasound revealed that it was fat necrosis Radiation: 02/23/22- 04/13/22 [place] [employer] [place] [employer] Return to clinic every 3 weeks for [employer]

## 2022-06-21 NOTE — Progress Notes (Signed)
Referral and all supporting documents faxed to [facility] Dr Michela Pitcher per MD [phone]. Fax confirmation received.

## 2022-06-25 ENCOUNTER — Encounter: Payer: Self-pay | Admitting: Hematology and Oncology

## 2022-07-02 ENCOUNTER — Telehealth: Payer: Self-pay | Admitting: Plastic Surgery

## 2022-07-02 ENCOUNTER — Encounter: Payer: Self-pay | Admitting: Hematology and Oncology

## 2022-07-02 NOTE — Telephone Encounter (Signed)
Spoke with pt regarding rescheduling appt so her father could join and she will the televisit appt and add her father on the call as well.

## 2022-07-03 ENCOUNTER — Encounter: Payer: Self-pay | Admitting: Plastic Surgery

## 2022-07-03 ENCOUNTER — Ambulatory Visit (INDEPENDENT_AMBULATORY_CARE_PROVIDER_SITE_OTHER): Payer: 59 | Admitting: Plastic Surgery

## 2022-07-03 DIAGNOSIS — C50411 Malignant neoplasm of upper-outer quadrant of right female breast: Secondary | ICD-10-CM

## 2022-07-03 DIAGNOSIS — Z9013 Acquired absence of bilateral breasts and nipples: Secondary | ICD-10-CM

## 2022-07-03 DIAGNOSIS — Z170 Estrogen receptor positive status [ER+]: Secondary | ICD-10-CM

## 2022-07-03 NOTE — Progress Notes (Unsigned)
   Subjective:    Patient ID: Jenna Cross, female    DOB: [birth year], 36 y.o.   MRN: [id number]  The patient is a 36 years old female joining me by phone with her dad.  We are further discussing the options for reconstruction.  The patient has decided to go to [facility] for a consult for a DIEP flap.  She will call me after and let me know her plan.    Review of Systems  Constitutional: Negative.   Eyes: Negative.   Respiratory: Negative.    Cardiovascular: Negative.   Genitourinary: Negative.        Objective:   Physical Exam     Assessment & Plan:     ICD-10-CM   1. Malignant neoplasm of upper-outer quadrant of right breast in female, estrogen receptor positive ([place])  C50.411    Z17.0     2. Acquired absence of breast and absent nipple, bilateral  Z90.13        I connected with  Jenna Cross on 07/03/22 by phone and verified that I am speaking with the correct person using two identifiers.  The patient was at home and I was at the office.   The patient will meet with Dr. Veda Canning and then talk with me again.   I discussed the limitations of evaluation and management by telemedicine. The patient expressed understanding and agreed to proceed.

## 2022-07-10 NOTE — Progress Notes (Incomplete)
Patient Care Team: Dian Queen, MD as PCP - General (Obstetrics and Gynecology) Dian Queen, MD (Obstetrics and Gynecology) Mauro Kaufmann, RN as Oncology Nurse Navigator Rockwell Germany, RN as Oncology Nurse Navigator Nicholas Lose, MD as Consulting Physician (Hematology and Oncology) Gery Pray, MD as Consulting Physician (Radiation Oncology) Rolm Bookbinder, MD as Consulting Physician (General Surgery)  DIAGNOSIS: No diagnosis found.  SUMMARY OF ONCOLOGIC HISTORY: Oncology History  Malignant neoplasm of upper-outer quadrant of right breast in female, estrogen receptor positive ([place])  06/23/2021 Initial Diagnosis   Palpable lump in the right breast. Diagnostic mammogram and [place]: a 3.8 cm mass at the retroareolar right breast, 1 prominent right axillary lymph node with a 4 mm cortex, 1 other which is borderline measuring 3 mm. 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  3 o'clock position: Biopsy fibroadenoma   07/12/2021 -  Neo-Adjuvant Chemotherapy   Neoadjuvant chemotherapy with TCH Perjeta 6 cycles followed by Herceptin Perjeta maintenance versus Kadcyla maintenance (based on response to neoadjuvant chemo) for 1 year   07/28/2021 Genetic Testing   Negative hereditary cancer genetic testing: no pathogenic variants detected in [facility] CancerNext-Expanded +RNAinsight Panel.  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  The report date is July 28, 2021.   The CancerNext-Expanded gene panel offered by [facility] and includes sequencing, rearrangement, and RNA analysis for the following 77 genes: AIP, ALK, APC, ATM, AXIN2, BAP1, BARD1, BLM, BMPR1A, BRCA1, BRCA2, BRIP1, CDC73, CDH1, CDK4, CDKN1B, CDKN2A, CHEK2, CTNNA1, DICER1, FANCC, FH, FLCN, GALNT12, KIF1B, LZTR1, MAX, MEN1, MET, MLH1, MSH2, MSH3, MSH6, MUTYH, NBN, NF1, NF2, NTHL1, PALB2, PHOX2B, PMS2, POT1,  PRKAR1A, PTCH1, PTEN, RAD51C, RAD51D, RB1, RECQL, RET, SDHA, SDHAF2, SDHB, SDHC, SDHD, SMAD4, SMARCA4, SMARCB1, SMARCE1, STK11, SUFU, TMEM127, TP53, TSC1, TSC2, VHL and XRCC2 (sequencing and deletion/duplication); EGFR, EGLN1, HOXB13, KIT, MITF, PDGFRA, POLD1, and POLE (sequencing only); EPCAM and GREM1 (deletion/duplication only).    12/06/2021 Surgery   Bilateral Mastectomies: Left mastectomy: Benign Right mastectomy: Multifocal ILC 1.8 cm, 0.9 cm, margins negative, 1/2 lymph nodes positive, ER 50%, PR 30%, HER2 1+ IHC   12/26/2021 - 12/29/2021 Chemotherapy   Patient is on Treatment Plan : BREAST ADO-Trastuzumab Emtansine (Kadcyla) q21d       CHIEF COMPLIANT: Follow-up of right breast cancer on Kadcyla.  INTERVAL HISTORY: Jenna Cross is a 36 y.o with the above mention. She presents to the clinic today for a follow-up.    ALLERGIES:  is allergic to wound dressings.  MEDICATIONS:  Current Outpatient Medications  Medication Sig Dispense Refill   acetaminophen (TYLENOL) 500 MG tablet Take 1,000 mg by mouth every 6 (six) hours as needed for mild pain or headache.     ALPRAZolam (XANAX) 0.5 MG tablet Take 0.5 mg by mouth 3 (three) times daily as needed for anxiety.     buPROPion (WELLBUTRIN XL) 150 MG 24 hr tablet Take 150 mg by mouth daily.     cephALEXin (KEFLEX) 500 MG capsule TAKE 1 CAPSULE (500 MG TOTAL) BY MOUTH 4 TIMES A DAY FOR 3 DAYS     dexamethasone (DECADRON) 4 MG tablet TAKE 1 TABLET DAILY. TAKE 1 TABLET DAY BEFORE CHEMO AND 1 TABLET DAY AFTER CHEMO WITH FOOD     diphenoxylate-atropine (LOMOTIL) 2.5-0.025 MG tablet Take 1 tablet by mouth 3 (three) times daily as needed for diarrhea or loose stools. 30 tablet 3   enoxaparin (LOVENOX) 60 MG/0.6ML injection INJECT 0.6 MLS (60 MG  TOTAL) INTO THE SKIN EVERY 12 (TWELVE) HOURS.     Goserelin Acetate (ZOLADEX [place])      hydrocortisone cream 0.5 % Apply 1 Application topically as needed for itching.     Investigational  tucatanib/placebo 150 MG tablet (ONT-380,NSC #[id number]) [facility] [id number] Take 2 tablets (300 mg total) 2 (two) times dailyfor 21 days.  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 Tucatinib or placebo should take orally twice daily with or without food, approximately 12 hours apart between doses. Tablets must be swallowed whole and may not be crushed, chewed or dissolved in liquid. CAUTION: Chemotherapy/Biotherapy 84 tablet 0   letrozole (FEMARA) 2.5 MG tablet Take 1 tablet (2.5 mg total) by mouth daily. 90 tablet 3   lidocaine-prilocaine (EMLA) cream lidocaine-prilocaine 2.5 %-2.5 % topical cream  APPLY TO AFFECTED AREA ONCE AS DIRECTED     loratadine (CLARITIN) 10 MG tablet Take 10 mg by mouth daily as needed for allergies.     LORazepam (ATIVAN) 0.5 MG tablet TAKE 1 TABLET (0.5 MG TOTAL) BY MOUTH AT BEDTIME AS NEEDED (NAUSEA OR VOMITING).     ondansetron (ZOFRAN) 8 MG tablet Take 1 tablet (8 mg total) by mouth every 8 (eight) hours as needed for nausea. 30 tablet 3   predniSONE (STERAPRED UNI-PAK 21 TAB) 10 MG (21) TBPK tablet 6 day taper; take as directed on package instructions 21 tablet 0   valACYclovir (VALTREX) 500 MG tablet Take 500 mg by mouth every evening.     venlafaxine XR (EFFEXOR-XR) 37.5 MG 24 hr capsule Take 1 capsule (37.5 mg total) by mouth daily with breakfast. 90 capsule 1   XARELTO 20 MG TABS tablet TAKE 1 TABLET BY MOUTH DAILY WITH SUPPER. 30 tablet 5   No current facility-administered medications for this visit.    PHYSICAL EXAMINATION: ECOG PERFORMANCE STATUS: {CHL ONC ECOG [phone]}  There were no vitals filed for this visit. There were no vitals filed for this visit.  BREAST:*** No palpable masses or nodules in either right or left breasts. No palpable axillary supraclavicular or infraclavicular adenopathy no breast tenderness or nipple discharge. (exam performed in the presence of a chaperone)  LABORATORY DATA:  I have reviewed the data as listed    Latest Ref Rng & Units  06/21/2022    9:08 AM 05/31/2022    9:05 AM 05/09/2022    8:44 AM  CMP  Glucose 70 - 99 mg/dL 106  92  100   BUN 6 - 20 mg/dL _0 Creatinine 0.44 - 1.00 mg/dL 0.87  0.62  0.85   Sodium 135 - 145 mmol/L 139  140  139   Potassium 3.5 - 5.1 mmol/L 3.7  3.6  3.7   Chloride 98 - 111 mmol/L 106  107  104   CO2 22 - 32 mmol/L _1 Calcium 8.9 - 10.3 mg/dL 9.4  9.3  9.3   Total Protein 6.5 - 8.1 g/dL 6.9  6.8  7.5   Total Bilirubin 0.3 - 1.2 mg/dL 0.5  0.4  0.6   Alkaline Phos 38 - 126 U/L 105  116  109   AST 15 - 41 U/L _2 ALT 0 - 44 U/L _3 Lab Results  Component Value Date   WBC 4.6 06/21/2022   HGB 13.0 06/21/2022   HCT 39.3 06/21/2022   MCV 92.9 06/21/2022   PLT 190 06/21/2022   NEUTROABS  2.6 06/21/2022    ASSESSMENT & PLAN:  No problem-specific Assessment & Plan notes found for this encounter.    No orders of the defined types were placed in this encounter.  The patient has a good understanding of the overall plan. she agrees with it. she will call with any problems that may develop before the next visit here. Total time spent: 30 mins including face to face time and time spent for planning, charting and co-ordination of care   Suzzette Righter, Cyril 07/10/22    I Gardiner Coins am scribing for Dr. Lindi Adie  ***

## 2022-07-12 ENCOUNTER — Inpatient Hospital Stay: Payer: 59 | Attending: Hematology and Oncology

## 2022-07-12 ENCOUNTER — Other Ambulatory Visit: Payer: Self-pay

## 2022-07-12 ENCOUNTER — Inpatient Hospital Stay: Payer: 59 | Admitting: Hematology and Oncology

## 2022-07-12 ENCOUNTER — Inpatient Hospital Stay: Payer: 59

## 2022-07-12 ENCOUNTER — Other Ambulatory Visit: Payer: Self-pay | Admitting: Hematology and Oncology

## 2022-07-12 VITALS — BP 124/65 | HR 79 | Temp 98.2°F | Resp 18

## 2022-07-12 DIAGNOSIS — Z5112 Encounter for antineoplastic immunotherapy: Secondary | ICD-10-CM | POA: Insufficient documentation

## 2022-07-12 DIAGNOSIS — C50411 Malignant neoplasm of upper-outer quadrant of right female breast: Secondary | ICD-10-CM | POA: Diagnosis not present

## 2022-07-12 DIAGNOSIS — Z79899 Other long term (current) drug therapy: Secondary | ICD-10-CM | POA: Insufficient documentation

## 2022-07-12 DIAGNOSIS — Z23 Encounter for immunization: Secondary | ICD-10-CM | POA: Diagnosis not present

## 2022-07-12 DIAGNOSIS — Z006 Encounter for examination for normal comparison and control in clinical research program: Secondary | ICD-10-CM | POA: Diagnosis present

## 2022-07-12 DIAGNOSIS — E538 Deficiency of other specified B group vitamins: Secondary | ICD-10-CM | POA: Diagnosis not present

## 2022-07-12 DIAGNOSIS — Z170 Estrogen receptor positive status [ER+]: Secondary | ICD-10-CM

## 2022-07-12 DIAGNOSIS — Z5111 Encounter for antineoplastic chemotherapy: Secondary | ICD-10-CM | POA: Diagnosis present

## 2022-07-12 DIAGNOSIS — Z95828 Presence of other vascular implants and grafts: Secondary | ICD-10-CM

## 2022-07-12 LAB — CMP (CANCER CENTER ONLY)
ALT: 20 U/L (ref 0–44)
AST: 23 U/L (ref 15–41)
Albumin: 4.1 g/dL (ref 3.5–5.0)
Alkaline Phosphatase: 100 U/L (ref 38–126)
Anion gap: 5 (ref 5–15)
BUN: 10 mg/dL (ref 6–20)
CO2: 28 mmol/L (ref 22–32)
Calcium: 8.9 mg/dL (ref 8.9–10.3)
Chloride: 106 mmol/L (ref 98–111)
Creatinine: 0.96 mg/dL (ref 0.44–1.00)
GFR, Estimated: >60 mL/min (ref >60)
Glucose, Bld: 98 mg/dL (ref 70–99)
Potassium: 3.9 mmol/L (ref 3.5–5.1)
Sodium: 139 mmol/L (ref 135–145)
Total Bilirubin: 0.5 mg/dL (ref 0.3–1.2)
Total Protein: 7.2 g/dL (ref 6.5–8.1)

## 2022-07-12 LAB — CBC WITH DIFFERENTIAL (CANCER CENTER ONLY)
Abs Immature Granulocytes: 0.01 10*3/uL (ref 0.00–0.07)
Basophils Absolute: 0 10*3/uL (ref 0.0–0.1)
Basophils Relative: 1 %
Eosinophils Absolute: 0.4 10*3/uL (ref 0.0–0.5)
Eosinophils Relative: 9 %
HCT: 38.1 % (ref 36.0–46.0)
Hemoglobin: 13.3 g/dL (ref 12.0–15.0)
Immature Granulocytes: 0 %
Lymphocytes Relative: 24 %
Lymphs Abs: 0.9 10*3/uL (ref 0.7–4.0)
MCH: 31.6 pg (ref 26.0–34.0)
MCHC: 34.9 g/dL (ref 30.0–36.0)
MCV: 90.5 fL (ref 80.0–100.0)
Monocytes Absolute: 0.5 10*3/uL (ref 0.1–1.0)
Monocytes Relative: 12 %
Neutro Abs: 2 10*3/uL (ref 1.7–7.7)
Neutrophils Relative %: 54 %
Platelet Count: 165 10*3/uL (ref 150–400)
RBC: 4.21 MIL/uL (ref 3.87–5.11)
RDW: 13.5 % (ref 11.5–15.5)
WBC Count: 3.7 10*3/uL — ABNORMAL LOW (ref 4.0–10.5)
nRBC: 0 % (ref 0.0–0.2)

## 2022-07-12 LAB — PREGNANCY, URINE: Preg Test, Ur: NEGATIVE

## 2022-07-12 MED ORDER — INFLUENZA VAC SPLIT QUAD 0.5 ML IM SUSY
0.5000 mL | PREFILLED_SYRINGE | Freq: Once | INTRAMUSCULAR | Status: AC
  Administered 2022-07-12: 0.5 mL via INTRAMUSCULAR
  Filled 2022-07-12 (×2): qty 0.5

## 2022-07-12 MED ORDER — INV-TUCATINIB/PLACEBO 150 MG TABLET ALLIANCE A011801: 300.0000 mg | ORAL_TABLET | Freq: Two times a day (BID) | ORAL | 0 refills | Status: AC

## 2022-07-12 MED ORDER — DIPHENHYDRAMINE HCL 25 MG PO CAPS
50.0000 mg | ORAL_CAPSULE | Freq: Once | ORAL | Status: AC
  Administered 2022-07-12: 50 mg via ORAL
  Filled 2022-07-12: qty 2

## 2022-07-12 MED ORDER — SODIUM CHLORIDE 0.9% FLUSH
10.0000 mL | INTRAVENOUS | Status: DC | PRN
  Administered 2022-07-12: 10 mL

## 2022-07-12 MED ORDER — SODIUM CHLORIDE 0.9 % IV SOLN: Freq: Once | INTRAVENOUS | Status: AC

## 2022-07-12 MED ORDER — ONDANSETRON HCL 4 MG/2ML IJ SOLN
8.0000 mg | Freq: Once | INTRAMUSCULAR | Status: AC
  Administered 2022-07-12: 8 mg via INTRAVENOUS
  Filled 2022-07-12: qty 4

## 2022-07-12 MED ORDER — CYANOCOBALAMIN 1000 MCG/ML IJ SOLN
1000.0000 ug | Freq: Once | INTRAMUSCULAR | Status: AC
  Administered 2022-07-12: 1000 ug via INTRAMUSCULAR
  Filled 2022-07-12: qty 1

## 2022-07-12 MED ORDER — GOSERELIN ACETATE 3.6 MG ~~LOC~~ IMPL
3.6000 mg | DRUG_IMPLANT | Freq: Once | SUBCUTANEOUS | Status: AC
  Administered 2022-07-12: 3.6 mg via SUBCUTANEOUS
  Filled 2022-07-12: qty 3.6

## 2022-07-12 MED ORDER — HEPARIN SOD (PORK) LOCK FLUSH 100 UNIT/ML IV SOLN
500.0000 [IU] | Freq: Once | INTRAVENOUS | Status: AC | PRN
  Administered 2022-07-12: 500 [IU]

## 2022-07-12 MED ORDER — SODIUM CHLORIDE 0.9 % IV SOLN
3.6000 mg/kg | Freq: Once | INTRAVENOUS | Status: AC
  Administered 2022-07-12: 220 mg via INTRAVENOUS
  Filled 2022-07-12: qty 8

## 2022-07-12 MED ORDER — ACETAMINOPHEN 325 MG PO TABS
650.0000 mg | ORAL_TABLET | Freq: Once | ORAL | Status: AC
  Administered 2022-07-12: 650 mg via ORAL
  Filled 2022-07-12: qty 2

## 2022-07-12 NOTE — Research (Addendum)
CompassHER2 Residual Disease, A Double-Blinded, Phase III Randomized Trial of T-DM1 and Placebo Compared with T-DMI and Tucatinib  Patient presented to clinic unaccompanied this morning for cycle 8 day 1.  LABS: CBC and CMP collected via port-a-cath per consent and study protocol. Patient tolerated well without complaint. Urine pregnancy collected per institutional guidelines.   VITAL SIGNS: Completed, including weight. See vital signs flowsheet.   H&P: Completed by Dr Lindi Adie. See clinic note dated 07/12/22. Provider reviewed lab results and cleared patient to continue treatment.   RESEARCH: Concomitant medications reviewed. Patient denied any new medications. Medication list updated and verified with patient; several old medications removed for clarification.  Solicited and other adverse events assessed per protocol. Patient reported that she again had mild nausea (grade 1) for three days following her T-DM1 infusion. She noted that her mild anorexia (grade 1) was more persistent this cycle, occurring intermittently from the day after her T-DM1 infusion and currently ongoing, as opposed to resolving within a few days of treatment. Patient also reported several new AEs. She reported that she had mild diarrhea (grade 1) this cycle, occurring intermittently from the day after her T-DM1 infusion and currently ongoing. Patient noted moderate disturbed sleep/insomnia (grade 2), as well as an increase in fatigue (now grade 2) requiring more frequent rest breaks. Dr Lindi Adie recommended that she start taking her letrozole and venlafaxine in the evenings as opposed to the mornings to see if symptoms would improve; they discussed the possibility of adding a pharmacologic intervention (ie: eszopiclone) in the future if needed. Patient also had slightly decreased WBCs (grade 1) on C8D1 labs. 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  Her hypertension (grade 1) and hyperglycemia (grade 1) recorded on cycle 7 day 1 also resolved; BP was 119/68 today and BG was within normal limits. Patient's breast pain (grade 1) and bruising (grade 1) were unchanged and ongoing. See table below  Patient returned remaining cycle 7 tucatinib/placebo and medication diary. She returned two bottles, one empty bottle and one with 37 tablets remaining. She stated that one additional tablet was dropped on the floor and thrown away (0 + 37 = 37; 100 total tablets dispensed - 37 remaining - 1 dropped and thrown away = 62 tablets taken). Medication diary reflects the same: 62 tablets taken; 31 total doses. Remaining tucatinib/placebo returned to pharmacy.  Cycle 8 tucatinib/placebo dispensed by pharmacy. Research RN verified that drug had correct patient name and study ID number. Drug was dispensed in two bottles of 50 '150mg'$  tablets, lot #[id number] (bottle 1: [id number], bottle 2: [id number]), exp 05/2023. Patient instructed to continue taking two tablets ('300mg'$  total) orally twice daily and to continue recording doses and side effects on medication diary. Reinforced importance of compliance with study medication; patient reports the alarms on her phone are helping her. Patient verbalized understanding. Patient was given cycle 8 medication diary.   INFUSION: Patient received clinic standard pre-medications for T-DM1 treatment -- '650mg'$  acetaminophen PO and '50mg'$  diphenhydramine PO. PKs not required this cycle per protocol. Patient tolerated infusion well without complaint. Patient took morning dose of tucatinib/placebo at 1015 from cycle 8 supply.   ADVERSE EVENT LOG: Study/Protocol: [facility] [id number] CompassHER2 Cycle 8  Baseline: anxiety (grade 2), fatigue (grade 1), herpes simplex virus, hypotension (grade 1), vascular access complication (grade 3). Not reportable (unrelated/unlikely to be related to study treatment and < grade 3): breast pain (grade 1), bruising (grade 1),  hypertension (  grade 1), hyperglycemia (grade 1).  Event (solicited vs other reportable) Grade CTCAE v5.0 Onset Date Resolved Date Attribution to Study Treatment Treatment Comments  Neutrophil count decreased 0 N/A N/A N/A N/A   Platelet count decreased 0 N/A N/A N/A N/A   Rash (maculo-papular) 0 N/A N/A N/A N/A   Epistaxis 0 N/A N/A N/A N/A    Diarrhea 1 06/22/22 Ongoing Possible N/A Patient reported a baseline of 2-3 stools/day. Patient reported ~2 additional stools per day occurring intermittently since the day after her T-DM1 infusion..  Nausea 1 06/22/22 06/24/22 Possible Ondansetron Patient reported mild nausea x3 days after T-DM1 infusion.  Vomiting 0 N/A N/A N/A N/A   Bilirubin increased 0 N/A N/A N/A N/A    AST increased 0 N/A N/A N/A N/A    ALT increased 0 N/A N/A N/A N/A    Anemia 0 N/A N/A N/A N/A    Mucositis oral 0 N/A N/A N/A N/A    Palmar-plantar erythrodysesthesia 0 N/A N/A N/A N/A    Peripheral neuropathy -- motor 0 N/A N/A N/A N/A    Peripheral neuropathy -- sensory 1 06/01/22 06/22/22 Unrelated N/A Patient reported symptoms had resolved.  Anorexia 1 06/22/22 Ongoing Probable N/A Patient reported loss of appetite occurring intermittently since the day after her T-DM1 infusion.  Insomnia 2 06/22/22 Ongoing Probable Adjustments to medication schedule Patient reported moderate disturbed sleep.  Fatigue 2 06/22/22 Ongoing Probable Adjustments to medication schedule Grade 1 at baseline  WBC decreased 1 07/12/22 Ongoing Probable N/A Noted on C8D1 labs.   PLAN: Next treatment visit is 08/02/22. Visit will include labs, a provider visit, and an infusion appointment. Patient reminded to bring remaining tucatinib/placebo tablets and cycle 8 medication diary to C9D1 appointment. Patient verbalized understanding. Research will meet with patient and accompany her to appointments on C9D1. Patient reminded to continue to fill out ePROs as they become available. Patient thanked for her time and  continued voluntary participation in this study. Patient has been provided direct contact information and is encouraged to contact this nurse for any questions or concerns.   Vickii Penna, RN, BSN, CPN Clinical Research Nurse I [phone]  07/12/2022 11:20 AM

## 2022-07-12 NOTE — Patient Instructions (Signed)
[facility] ONCOLOGY  Discharge Instructions: Thank you for choosing [facility] to provide your oncology and hematology care.   If you have a lab appointment with the [facility], please go directly to the [facility] and check in at the registration area.   Wear comfortable clothing and clothing appropriate for easy access to any Portacath or PICC line.   We strive to give you quality time with your provider. You may need to reschedule your appointment if you arrive late (15 or more minutes).  Arriving late affects you and other patients whose appointments are after yours.  Also, if you miss three or more appointments without notifying the office, you may be dismissed from the clinic at the provider's discretion.      For prescription refill requests, have your pharmacy contact our office and allow 72 hours for refills to be completed.    Today you received the following chemotherapy and/or immunotherapy agents: Trastuzumab      To help prevent nausea and vomiting after your treatment, we encourage you to take your nausea medication as directed.  BELOW ARE SYMPTOMS THAT SHOULD BE REPORTED IMMEDIATELY: *FEVER GREATER THAN 100.4 F (38 C) OR HIGHER *CHILLS OR SWEATING *NAUSEA AND VOMITING THAT IS NOT CONTROLLED WITH YOUR NAUSEA MEDICATION *UNUSUAL SHORTNESS OF BREATH *UNUSUAL BRUISING OR BLEEDING *URINARY PROBLEMS (pain or burning when urinating, or frequent urination) *BOWEL PROBLEMS (unusual diarrhea, constipation, pain near the anus) TENDERNESS IN MOUTH AND THROAT WITH OR WITHOUT PRESENCE OF ULCERS (sore throat, sores in mouth, or a toothache) UNUSUAL RASH, SWELLING OR PAIN  UNUSUAL VAGINAL DISCHARGE OR ITCHING   Items with * indicate a potential emergency and should be followed up as soon as possible or go to the Emergency Department if any problems should occur.  Please show the CHEMOTHERAPY ALERT CARD or IMMUNOTHERAPY ALERT CARD at check-in  to the Emergency Department and triage nurse.  Should you have questions after your visit or need to cancel or reschedule your appointment, please contact [facility]  Dept: [phone]  and follow the prompts.  Office hours are 8:00 a.m. to 4:30 p.m. Monday - Friday. Please note that voicemails left after 4:00 p.m. may not be returned until the following business day.  We are closed weekends and major holidays. You have access to a nurse at all times for urgent questions. Please call the main number to the clinic Dept: [phone] and follow the prompts.   For any non-urgent questions, you may also contact your provider using MyChart. We now offer e-Visits for anyone 50 and older to request care online for non-urgent symptoms. For details visit [email or web address].   Also download the MyChart app! Go to the app store, search "MyChart", open the app, select [facility], and log in with your MyChart username and password.  Masks are optional in the cancer centers. If you would like for your care team to wear a mask while they are taking care of you, please let them know. You may have one support person who is at least 37 years old accompany you for your appointments.    Influenza (Flu) Vaccine (Inactivated or Recombinant): What You Need to Know 1. Why get vaccinated? Influenza vaccine can prevent influenza (flu). Flu is a contagious disease that spreads around the [place] every year, usually between October and May. Anyone can get the flu, but it is more dangerous for some people. Infants and young children, people 73 years  and older, pregnant people, and people with certain health conditions or a weakened immune system are at greatest risk of flu complications. Pneumonia, bronchitis, sinus infections, and ear infections are examples of flu-related complications. If you have a medical condition, such as heart disease, cancer, or diabetes, flu can make it  worse. Flu can cause fever and chills, sore throat, muscle aches, fatigue, cough, headache, and runny or stuffy nose. Some people may have vomiting and diarrhea, though this is more common in children than adults. In an average year, thousands of people in the [place] die from flu, and many more are hospitalized. Flu vaccine prevents millions of illnesses and flu-related visits to the doctor each year. 2. Influenza vaccines CDC recommends everyone 6 months and older get vaccinated every flu season. Children 6 months through 27 years of age may need 2 doses during a single flu season. Everyone else needs only 1 dose each flu season. It takes about 2 weeks for protection to develop after vaccination. There are many flu viruses, and they are always changing. Each year a new flu vaccine is made to protect against the influenza viruses believed to be likely to cause disease in the upcoming flu season. Even when the vaccine doesn't exactly match these viruses, it may still provide some protection. Influenza vaccine does not cause flu. Influenza vaccine may be given at the same time as other vaccines. 3. Talk with your health care provider Tell your vaccination provider if the person getting the vaccine: Has had an allergic reaction after a previous dose of influenza vaccine, or has any severe, life-threatening allergies Has ever had Guillain-Barr Syndrome (also called "GBS") In some cases, your health care provider may decide to postpone influenza vaccination until a future visit. Influenza vaccine can be administered at any time during pregnancy. People who are or will be pregnant during influenza season should receive inactivated influenza vaccine. People with minor illnesses, such as a cold, may be vaccinated. People who are moderately or severely ill should usually wait until they recover before getting influenza vaccine. Your health care provider can give you more information. 4. Risks of a  vaccine reaction Soreness, redness, and swelling where the shot is given, fever, muscle aches, and headache can happen after influenza vaccination. There may be a very small increased risk of Guillain-Barr Syndrome (GBS) after inactivated influenza vaccine (the flu shot). Young children who get the flu shot along with pneumococcal vaccine (PCV13) and/or DTaP vaccine at the same time might be slightly more likely to have a seizure caused by fever. Tell your health care provider if a child who is getting flu vaccine has ever had a seizure. People sometimes faint after medical procedures, including vaccination. Tell your provider if you feel dizzy or have vision changes or ringing in the ears. As with any medicine, there is a very remote chance of a vaccine causing a severe allergic reaction, other serious injury, or death. 5. What if there is a serious problem? An allergic reaction could occur after the vaccinated person leaves the clinic. If you see signs of a severe allergic reaction (hives, swelling of the face and throat, difficulty breathing, a fast heartbeat, dizziness, or weakness), call 9-1-1 and get the person to the nearest hospital. For other signs that concern you, call your health care provider. Adverse reactions should be reported to the Vaccine Adverse Event Reporting System (VAERS). Your health care provider will usually file this report, or you can do it yourself. Visit the VAERS  website at www.vaers.SamedayNews.es or call (903)777-4202. VAERS is only for reporting reactions, and VAERS staff members do not give medical advice. 6. The National Vaccine Injury Compensation Program The [employer] Vaccine Injury Compensation Program (VICP) is a federal program that was created to compensate people who may have been injured by certain vaccines. Claims regarding alleged injury or death due to vaccination have a time limit for filing, which may be as short as two years. Visit the VICP website at  GoldCloset.com.ee or call (562)155-0604 to learn about the program and about filing a claim. 7. How can I learn more? Ask your health care provider. Call your local or state health department. Visit the website of the Food and Drug Administration (FDA) for vaccine package inserts and additional information at TraderRating.uy. Contact the Centers for Disease Control and Prevention (CDC): Call (847)534-0131 (1-800-CDC-INFO) or Visit CDC's website at https://gibson.com/. Source: CDC Vaccine Information Statement Inactivated Influenza Vaccine (05/13/2020) This same material is available at http://www.wolf.info/ for no charge. This information is not intended to replace advice given to you by your health care provider. Make sure you discuss any questions you have with your health care provider. Document Revised: 08/23/2021 Document Reviewed: 06/15/2021 Elsevier Patient Education  [place].

## 2022-07-12 NOTE — Assessment & Plan Note (Signed)
Palpable lump in the right breast. Diagnostic mammogram and [place]: a 3.8 cm mass at the retroareolar right breast, 1 prominent right axillary lymph node with a 4 mm cortex, 1 other which is borderline measuring 3 mm. 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 3 o'clock position: Biopsy fibroadenoma  Treatment plan: 1. Neoadjuvant chemotherapy with TCH Perjeta 6 cycles followed by Herceptin Perjeta maintenance versus Kadcyla maintenance (based on response to neoadjuvant chemo) for 1 year 2.Bilateral Mastectomies: Left mastectomy: Benign Right mastectomy: Multifocal ILC 1.8 cm, 0.9 cm, margins negative, 1/2 lymph nodes positive, ER 50%, PR 30%, HER2 1+ IHC 3.Compass HER2 clinical trial: Randomization between Kadcyla versus Kadcyla plus Tucatinib 4.Followed by adjuvant radiation therapy 5.Followed by antiestrogen therapy  ------------------------------------------------------------------------------------------------------------------------ Current treatment: Kadcylacycle43mintenance (Compass HER2 clinical trial)+ Tucatinib vs Placebo Echocardiogram March 2023: EF 55 to 60%  Hospitalization 01/22/2022-01/26/2022 abscess/cellulitis required removal of the implant The plan is now a delayed reconstruction  Adverse Effects: Tolerating it well 1. Mild diarrhea: subsided 2. Headaches: Resolved  Worsening fatigue: We will administer B12 injections to see if it would help her fatigue issues. We will try to get it authorized. If he cannot get it authorized she will start taking sublingual B12 5000 mcg dose.  Skin nodule in the middle of the left reconstructed breast: Ultrasound revealed that it was fat necrosis Radiation: 02/23/22- 04/13/22  Noncompliance with Tucatinib  [facility]

## 2022-07-17 ENCOUNTER — Telehealth: Payer: Self-pay

## 2022-07-17 NOTE — Telephone Encounter (Signed)
CompassHER2 Residual Disease, A Double-Blinded, Phase III Randomized Trial of T-DM1 and Placebo Compared with T-DMI and Tucatinib  Called and LVM for patient as a reminder to complete ePROs on Patient Cloud app by 10/17. Left call-back information in case of any questions or concerns.  Vickii Penna, RN, BSN, CPN Clinical Research Nurse I [phone]  07/17/2022 9:52 AM

## 2022-07-22 ENCOUNTER — Other Ambulatory Visit: Payer: Self-pay | Admitting: Hematology and Oncology

## 2022-07-24 ENCOUNTER — Other Ambulatory Visit: Payer: Self-pay | Admitting: Hematology and Oncology

## 2022-07-24 MED ORDER — DIPHENOXYLATE-ATROPINE 2.5-0.025 MG PO TABS: 1.0000 | ORAL_TABLET | Freq: Three times a day (TID) | ORAL | 3 refills | Status: DC | PRN

## 2022-08-01 NOTE — Progress Notes (Signed)
Patient Care Team: Dian Queen, MD as PCP - General (Obstetrics and Gynecology) Dian Queen, MD (Obstetrics and Gynecology) Mauro Kaufmann, RN as Oncology Nurse Navigator Rockwell Germany, RN as Oncology Nurse Navigator Nicholas Lose, MD as Consulting Physician (Hematology and Oncology) Gery Pray, MD as Consulting Physician (Radiation Oncology) Rolm Bookbinder, MD as Consulting Physician (General Surgery)  DIAGNOSIS: No diagnosis found.  SUMMARY OF ONCOLOGIC HISTORY: Oncology History  Malignant neoplasm of upper-outer quadrant of right breast in female, estrogen receptor positive ([place])  06/23/2021 Initial Diagnosis   Palpable lump in the right breast. Diagnostic mammogram and [place]: a 3.8 cm mass at the retroareolar right breast, 1 prominent right axillary lymph node with a 4 mm cortex, 1 other which is borderline measuring 3 mm. 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  3 o'clock position: Biopsy fibroadenoma   07/12/2021 -  Neo-Adjuvant Chemotherapy   Neoadjuvant chemotherapy with TCH Perjeta 6 cycles followed by Herceptin Perjeta maintenance versus Kadcyla maintenance (based on response to neoadjuvant chemo) for 1 year   07/28/2021 Genetic Testing   Negative hereditary cancer genetic testing: no pathogenic variants detected in [facility] CancerNext-Expanded +RNAinsight Panel.  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  The report date is July 28, 2021.   The CancerNext-Expanded gene panel offered by [facility] and includes sequencing, rearrangement, and RNA analysis for the following 77 genes: AIP, ALK, APC, ATM, AXIN2, BAP1, BARD1, BLM, BMPR1A, BRCA1, BRCA2, BRIP1, CDC73, CDH1, CDK4, CDKN1B, CDKN2A, CHEK2, CTNNA1, DICER1, FANCC, FH, FLCN, GALNT12, KIF1B, LZTR1, MAX, MEN1, MET, MLH1, MSH2, MSH3, MSH6, MUTYH, NBN, NF1, NF2, NTHL1, PALB2, PHOX2B, PMS2, POT1,  PRKAR1A, PTCH1, PTEN, RAD51C, RAD51D, RB1, RECQL, RET, SDHA, SDHAF2, SDHB, SDHC, SDHD, SMAD4, SMARCA4, SMARCB1, SMARCE1, STK11, SUFU, TMEM127, TP53, TSC1, TSC2, VHL and XRCC2 (sequencing and deletion/duplication); EGFR, EGLN1, HOXB13, KIT, MITF, PDGFRA, POLD1, and POLE (sequencing only); EPCAM and GREM1 (deletion/duplication only).    12/06/2021 Surgery   Bilateral Mastectomies: Left mastectomy: Benign Right mastectomy: Multifocal ILC 1.8 cm, 0.9 cm, margins negative, 1/2 lymph nodes positive, ER 50%, PR 30%, HER2 1+ IHC   12/26/2021 - 12/29/2021 Chemotherapy   Patient is on Treatment Plan : BREAST ADO-Trastuzumab Emtansine (Kadcyla) q21d       CHIEF COMPLIANT:   Follow-up of right breast cancer on Kadcyla.  INTERVAL HISTORY: Jenna Cross is a  36 y.o with the above- mentioned. Currently on treatment with Kadcyla. She presents to the clinic today for a follow-up.  ALLERGIES:  is allergic to wound dressings.  MEDICATIONS:  Current Outpatient Medications  Medication Sig Dispense Refill   acetaminophen (TYLENOL) 500 MG tablet Take 1,000 mg by mouth every 6 (six) hours as needed for mild pain or headache.     ALPRAZolam (XANAX) 0.5 MG tablet Take 0.5 mg by mouth 3 (three) times daily as needed for anxiety.     diphenoxylate-atropine (LOMOTIL) 2.5-0.025 MG tablet Take 1 tablet by mouth 3 (three) times daily as needed for diarrhea or loose stools. 30 tablet 3   Goserelin Acetate (ZOLADEX [place])      hydrocortisone cream 0.5 % Apply 1 Application topically as needed for itching.     Investigational tucatanib/placebo 150 MG tablet (ONT-380,NSC #[id number]) [facility] [id number] Take 2 tablets (300 mg total) 2 (two) times dailyfor 21 days.  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 Tucatinib or placebo should take orally twice daily with or without food, approximately 12 hours apart between doses. Tablets must be swallowed whole  and may not be crushed, chewed or dissolved in liquid. CAUTION: Chemotherapy/Biotherapy 84 tablet  0   letrozole (FEMARA) 2.5 MG tablet Take 1 tablet (2.5 mg total) by mouth daily. 90 tablet 3   lidocaine-prilocaine (EMLA) cream lidocaine-prilocaine 2.5 %-2.5 % topical cream  APPLY TO AFFECTED AREA ONCE AS DIRECTED     loratadine (CLARITIN) 10 MG tablet Take 10 mg by mouth daily as needed for allergies.     LORazepam (ATIVAN) 0.5 MG tablet TAKE 1 TABLET (0.5 MG TOTAL) BY MOUTH AT BEDTIME AS NEEDED (NAUSEA OR VOMITING).     ondansetron (ZOFRAN) 8 MG tablet Take 1 tablet (8 mg total) by mouth every 8 (eight) hours as needed for nausea. 30 tablet 3   valACYclovir (VALTREX) 500 MG tablet Take 500 mg by mouth every evening.     venlafaxine XR (EFFEXOR-XR) 37.5 MG 24 hr capsule Take 1 capsule (37.5 mg total) by mouth daily with breakfast. 90 capsule 1   XARELTO 20 MG TABS tablet TAKE 1 TABLET BY MOUTH DAILY WITH SUPPER. 30 tablet 5   No current facility-administered medications for this visit.    PHYSICAL EXAMINATION: ECOG PERFORMANCE STATUS: {CHL ONC ECOG PS:[phone]}  There were no vitals filed for this visit. There were no vitals filed for this visit.  BREAST:*** No palpable masses or nodules in either right or left breasts. No palpable axillary supraclavicular or infraclavicular adenopathy no breast tenderness or nipple discharge. (exam performed in the presence of a chaperone)  LABORATORY DATA:  I have reviewed the data as listed    Latest Ref Rng & Units 07/12/2022    8:38 AM 06/21/2022    9:08 AM 05/31/2022    9:05 AM  CMP  Glucose 70 - 99 mg/dL 98  106  92   BUN 6 - 20 mg/dL 10  10  12    Creatinine 0.44 - 1.00 mg/dL 0.96  0.87  0.62   Sodium 135 - 145 mmol/L 139  139  140   Potassium 3.5 - 5.1 mmol/L 3.9  3.7  3.6   Chloride 98 - 111 mmol/L 106  106  107   CO2 22 - 32 mmol/L 28  28  30    Calcium 8.9 - 10.3 mg/dL 8.9  9.4  9.3   Total Protein 6.5 - 8.1 g/dL 7.2  6.9  6.8   Total Bilirubin 0.3 - 1.2 mg/dL 0.5  0.5  0.4   Alkaline Phos 38 - 126 U/L 100  105  116   AST 15 -  41 U/L 23  28  21    ALT 0 - 44 U/L 20  28  16      Lab Results  Component Value Date   WBC 3.7 (L) 07/12/2022   HGB 13.3 07/12/2022   HCT 38.1 07/12/2022   MCV 90.5 07/12/2022   PLT 165 07/12/2022   NEUTROABS 2.0 07/12/2022    ASSESSMENT & PLAN:  No problem-specific Assessment & Plan notes found for this encounter.    No orders of the defined types were placed in this encounter.  The patient has a good understanding of the overall plan. she agrees with it. she will call with any problems that may develop before the next visit here. Total time spent: 30 mins including face to face time and time spent for planning, charting and co-ordination of care   Suzzette Righter, [place] 08/01/22    I Gardiner Coins am scribing for Dr. Lindi Adie  ***

## 2022-08-02 ENCOUNTER — Encounter: Payer: Self-pay | Admitting: *Deleted

## 2022-08-02 ENCOUNTER — Other Ambulatory Visit: Payer: Self-pay

## 2022-08-02 ENCOUNTER — Inpatient Hospital Stay: Payer: 59

## 2022-08-02 ENCOUNTER — Inpatient Hospital Stay (HOSPITAL_BASED_OUTPATIENT_CLINIC_OR_DEPARTMENT_OTHER): Payer: 59 | Admitting: Hematology and Oncology

## 2022-08-02 DIAGNOSIS — C50411 Malignant neoplasm of upper-outer quadrant of right female breast: Secondary | ICD-10-CM | POA: Diagnosis not present

## 2022-08-02 DIAGNOSIS — Z5111 Encounter for antineoplastic chemotherapy: Secondary | ICD-10-CM | POA: Diagnosis not present

## 2022-08-02 DIAGNOSIS — Z170 Estrogen receptor positive status [ER+]: Secondary | ICD-10-CM

## 2022-08-02 DIAGNOSIS — Z95828 Presence of other vascular implants and grafts: Secondary | ICD-10-CM

## 2022-08-02 LAB — CBC WITH DIFFERENTIAL (CANCER CENTER ONLY)
Abs Immature Granulocytes: 0.02 10*3/uL (ref 0.00–0.07)
Basophils Absolute: 0 10*3/uL (ref 0.0–0.1)
Basophils Relative: 1 %
Eosinophils Absolute: 0.4 10*3/uL (ref 0.0–0.5)
Eosinophils Relative: 8 %
HCT: 36.8 % (ref 36.0–46.0)
Hemoglobin: 12.7 g/dL (ref 12.0–15.0)
Immature Granulocytes: 0 %
Lymphocytes Relative: 23 %
Lymphs Abs: 1.2 10*3/uL (ref 0.7–4.0)
MCH: 31.8 pg (ref 26.0–34.0)
MCHC: 34.5 g/dL (ref 30.0–36.0)
MCV: 92 fL (ref 80.0–100.0)
Monocytes Absolute: 0.4 10*3/uL (ref 0.1–1.0)
Monocytes Relative: 7 %
Neutro Abs: 3.2 10*3/uL (ref 1.7–7.7)
Neutrophils Relative %: 61 %
Platelet Count: 200 10*3/uL (ref 150–400)
RBC: 4 MIL/uL (ref 3.87–5.11)
RDW: 14.1 % (ref 11.5–15.5)
WBC Count: 5.3 10*3/uL (ref 4.0–10.5)
nRBC: 0 % (ref 0.0–0.2)

## 2022-08-02 LAB — CMP (CANCER CENTER ONLY)
ALT: 18 U/L (ref 0–44)
AST: 26 U/L (ref 15–41)
Albumin: 4 g/dL (ref 3.5–5.0)
Alkaline Phosphatase: 110 U/L (ref 38–126)
Anion gap: 4 — ABNORMAL LOW (ref 5–15)
BUN: 13 mg/dL (ref 6–20)
CO2: 27 mmol/L (ref 22–32)
Calcium: 9 mg/dL (ref 8.9–10.3)
Chloride: 109 mmol/L (ref 98–111)
Creatinine: 0.97 mg/dL (ref 0.44–1.00)
GFR, Estimated: >60 mL/min (ref >60)
Glucose, Bld: 91 mg/dL (ref 70–99)
Potassium: 3.7 mmol/L (ref 3.5–5.1)
Sodium: 140 mmol/L (ref 135–145)
Total Bilirubin: 0.6 mg/dL (ref 0.3–1.2)
Total Protein: 6.9 g/dL (ref 6.5–8.1)

## 2022-08-02 LAB — PREGNANCY, URINE: Preg Test, Ur: NEGATIVE

## 2022-08-02 MED ORDER — ACETAMINOPHEN 325 MG PO TABS
650.0000 mg | ORAL_TABLET | Freq: Once | ORAL | Status: AC
  Administered 2022-08-02: 650 mg via ORAL
  Filled 2022-08-02: qty 2

## 2022-08-02 MED ORDER — SODIUM CHLORIDE 0.9 % IV SOLN
3.6000 mg/kg | Freq: Once | INTRAVENOUS | Status: AC
  Administered 2022-08-02: 220 mg via INTRAVENOUS
  Filled 2022-08-02: qty 8

## 2022-08-02 MED ORDER — INV-TUCATINIB/PLACEBO 150 MG TABLET ALLIANCE A011801: 300.0000 mg | ORAL_TABLET | Freq: Two times a day (BID) | ORAL | 0 refills | Status: AC

## 2022-08-02 MED ORDER — DIPHENHYDRAMINE HCL 25 MG PO CAPS
50.0000 mg | ORAL_CAPSULE | Freq: Once | ORAL | Status: AC
  Administered 2022-08-02: 50 mg via ORAL
  Filled 2022-08-02: qty 2

## 2022-08-02 MED ORDER — SODIUM CHLORIDE 0.9% FLUSH
10.0000 mL | Freq: Once | INTRAVENOUS | Status: AC
  Administered 2022-08-02: 10 mL

## 2022-08-02 MED ORDER — HEPARIN SOD (PORK) LOCK FLUSH 100 UNIT/ML IV SOLN
500.0000 [IU] | Freq: Once | INTRAVENOUS | Status: AC | PRN
  Administered 2022-08-02: 500 [IU]

## 2022-08-02 MED ORDER — SODIUM CHLORIDE 0.9 % IV SOLN: Freq: Once | INTRAVENOUS | Status: AC

## 2022-08-02 MED ORDER — ONDANSETRON HCL 4 MG/2ML IJ SOLN
8.0000 mg | Freq: Once | INTRAMUSCULAR | Status: AC
  Administered 2022-08-02: 8 mg via INTRAVENOUS
  Filled 2022-08-02: qty 4

## 2022-08-02 MED ORDER — SODIUM CHLORIDE 0.9% FLUSH
10.0000 mL | INTRAVENOUS | Status: DC | PRN
  Administered 2022-08-02: 10 mL

## 2022-08-02 NOTE — Assessment & Plan Note (Addendum)
Palpable lump in the right breast. Diagnostic mammogram and [place]: a 3.8 cm mass at the retroareolar right breast, 1 prominent right axillary lymph node with a 4 mm cortex, 1 other which is borderline measuring 3 mm. 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 3 o'clock position: Biopsy fibroadenoma  Treatment plan: 1. Neoadjuvant chemotherapy with TCH Perjeta 6 cycles followed by Herceptin Perjeta maintenance versus Kadcyla maintenance (based on response to neoadjuvant chemo) for 1 year 2.Bilateral Mastectomies: Left mastectomy: Benign Right mastectomy: Multifocal ILC 1.8 cm, 0.9 cm, margins negative, 1/2 lymph nodes positive, ER 50%, PR 30%, HER2 1+ IHC 3.Compass HER2 clinical trial: Randomization between Kadcyla versus Kadcyla plus Tucatinib 4.Followed by adjuvant radiation therapy 5.Followed by antiestrogen therapy  ------------------------------------------------------------------------------------------------------------------------ Current treatment: Kadcylacycle43mintenance (Compass HER2 clinical trial)+ Tucatinib vs Placebo Echocardiogram March 2023: EF 55 to 60%  Hospitalization 01/22/2022-01/26/2022 abscess/cellulitis required removal of the implant The plan is now a delayed reconstruction  Adverse Effects: Tolerating it well 1. Mild diarrhea: Same as before 2. mild nausea couple days after treatment 3.  Fatigue: Makes her take a nap. 4.  Weakness of the hands: Encouraged her to use weights for exercise.  We discussed PT referral.  She wants to wait on that at this time.  Worsening fatigue: On monthly B12 injections  Skin nodule in the middle of the left reconstructed breast:Ultrasound revealed that it was fat necrosis Radiation: 02/23/22- 04/13/22  Noncompliance with Tucatinib: She is doing significantly better on taking the medication.  Return to clinic every 3 weeks for Kadcyla

## 2022-08-02 NOTE — Patient Instructions (Signed)
[facility] MEDICAL ONCOLOGY   Discharge Instructions: Thank you for choosing [facility] to provide your oncology and hematology care.   If you have a lab appointment with the Cancer Center, please go directly to the Cancer Center and check in at the registration area.   Wear comfortable clothing and clothing appropriate for easy access to any Portacath or PICC line.   We strive to give you quality time with your provider. You may need to reschedule your appointment if you arrive late (15 or more minutes).  Arriving late affects you and other patients whose appointments are after yours.  Also, if you miss three or more appointments without notifying the office, you may be dismissed from the clinic at the provider's discretion.      For prescription refill requests, have your pharmacy contact our office and allow 72 hours for refills to be completed.    Today you received the following chemotherapy and/or immunotherapy agents: ado-trastuzumab emtansine      To help prevent nausea and vomiting after your treatment, we encourage you to take your nausea medication as directed.  BELOW ARE SYMPTOMS THAT SHOULD BE REPORTED IMMEDIATELY: *FEVER GREATER THAN 100.4 F (38 C) OR HIGHER *CHILLS OR SWEATING *NAUSEA AND VOMITING THAT IS NOT CONTROLLED WITH YOUR NAUSEA MEDICATION *UNUSUAL SHORTNESS OF BREATH *UNUSUAL BRUISING OR BLEEDING *URINARY PROBLEMS (pain or burning when urinating, or frequent urination) *BOWEL PROBLEMS (unusual diarrhea, constipation, pain near the anus) TENDERNESS IN MOUTH AND THROAT WITH OR WITHOUT PRESENCE OF ULCERS (sore throat, sores in mouth, or a toothache) UNUSUAL RASH, SWELLING OR PAIN  UNUSUAL VAGINAL DISCHARGE OR ITCHING   Items with * indicate a potential emergency and should be followed up as soon as possible or go to the Emergency Department if any problems should occur.  Please show the CHEMOTHERAPY ALERT CARD or IMMUNOTHERAPY ALERT  CARD at check-in to the Emergency Department and triage nurse.  Should you have questions after your visit or need to cancel or reschedule your appointment, please contact [facility]  Dept: [phone]  and follow the prompts.  Office hours are 8:00 a.m. to 4:30 p.m. Monday - Friday. Please note that voicemails left after 4:00 p.m. may not be returned until the following business day.  We are closed weekends and major holidays. You have access to a nurse at all times for urgent questions. Please call the main number to the clinic Dept: [phone] and follow the prompts.   For any non-urgent questions, you may also contact your provider using MyChart. We now offer e-Visits for anyone 18 and older to request care online for non-urgent symptoms. For details visit [email or web address].   Also download the MyChart app! Go to the app store, search "MyChart", open the app, select [facility], and log in with your MyChart username and password.  Masks are optional in the cancer centers. If you would like for your care team to wear a mask while they are taking care of you, please let them know. You may have one support person who is at least 37 years old accompany you for your appointments. 

## 2022-08-02 NOTE — Research (Signed)
CompassHER2 Residual Disease, A Double-Blinded, Phase III Randomized Trial of T-DM1 and Placebo Compared with T-DMI and Tucatinib  Patient presented to clinic unaccompanied this morning for cycle 9 day 1.  LABS: CBC and CMP collected via port-a-cath per consent and study protocol. Patient tolerated well without complaint. Urine pregnancy collected per institutional guidelines.   VITAL SIGNS: Completed, including weight. See vital signs flowsheet.   H&P: Completed by Dr Lindi Adie. See clinic note dated 08/02/22. Provider reviewed lab results and cleared patient to continue treatment.   RESEARCH: Concomitant medications reviewed. Patient denied any new medications. Medication list updated and verified with patient.  Solicited and other adverse events assessed per protocol. Patient reported that she again had mild nausea (grade 1) for three days following her T-DM1 infusion. She noted that some of her ongoing AEs resolved this cycle, while several remained bothersome; her mild anorexia (grade 1) continued before resolving three days after T-DM1 treatment. She reported that her ongoing mild diarrhea (grade 1) resolved four days after T-DM1 treatment. Patient noted ongoing moderate disturbed sleep/insomnia (grade 2), as well as ongoing moderate fatigue (grade 2). Per Dr Geralyn Flash recommendation, patient did begin taking her letrozole and venlafaxine in the evenings as opposed to the mornings; she reported a possible slight improvement due to this change. Patient also reported a new AE. Patient reported mild muscle weakness (grade 1) in her arms, noting a sense of "heaviness," especially on her right (affected) side. Per Dr Lindi Adie, this is unrelated to study treatment. Patient encouraged to perform gentle stretches, use 3-5lb weights, and continue her pilates. Patient's breast pain (grade 1) and bruising (grade 1) were unchanged and ongoing. See table below  Patient returned remaining cycle 8 tucatinib/placebo  and medication diary. She returned two bottles, one empty bottle and one with 38 tablets remaining (0 + 38 = 38; 100 total tablets dispensed - 38 remaining = 62 tablets taken). Medication diary reflects the same: 62 tablets taken; 31 total doses. Remaining tucatinib/placebo returned to pharmacy.  Cycle 9 tucatinib/placebo dispensed by pharmacy. Research RN verified that drug had correct patient name and study ID number. Drug was dispensed in two bottles of 50 '150mg'$  tablets, lot #[id number] (bottle 1: [id number], bottle 2: [id number]), exp 05/2023. Patient instructed to continue taking two tablets ('300mg'$  total) orally twice daily and to continue recording doses and side effects on medication diary. Reinforced importance of compliance with study medication; patient reports the alarms on her phone continue to be beneficial. Patient verbalized understanding. Patient was given cycle 9 medication diary.   INFUSION: Patient received clinic standard pre-medications for T-DM1 treatment -- '650mg'$  acetaminophen PO and '50mg'$  diphenhydramine PO. PKs not required this cycle per protocol. Patient tolerated infusion well without complaint. Patient took morning dose of tucatinib/placebo at 0945 from cycle 9 supply.   ADVERSE EVENT LOG: Study/Protocol: [facility] [id number] CompassHER2 Cycle 9  Baseline: anxiety (grade 2), fatigue (grade 1), herpes simplex virus, hypotension (grade 1), vascular access complication (grade 3). Not reportable (unrelated/unlikely to be related to study treatment and < grade 3): breast pain (grade 1), bruising (grade 1), muscle weakness -- upper limb (grade 1).  Event (solicited vs other reportable) Grade CTCAE v5.0 Onset Date Resolved Date Attribution to Study Treatment Treatment Comments  Neutrophil count decreased 0 N/A N/A N/A N/A   Platelet count decreased 0 N/A N/A N/A N/A   Rash (maculo-papular) 0 N/A N/A N/A N/A   Epistaxis 0 N/A N/A N/A N/A    Diarrhea 1 06/22/22 07/16/22 Possible N/A Patient reported  a baseline of 2-3 stools/day. Patient reported ~2 additional stools per day occurring intermittently x4 days after her T-DM1 infusion.  Nausea 1 07/13/22 07/15/22 Possible Ondansetron Patient reported mild nausea x3 days after T-DM1 infusion.  Vomiting 0 N/A N/A N/A N/A   Bilirubin increased 0 N/A N/A N/A N/A    AST increased 0 N/A N/A N/A N/A    ALT increased 0 N/A N/A N/A N/A    Anemia 0 N/A N/A N/A N/A    Mucositis oral 0 N/A N/A N/A N/A    Palmar-plantar erythrodysesthesia 0 N/A N/A N/A N/A    Peripheral neuropathy -- motor 0 N/A N/A N/A N/A    Peripheral neuropathy -- sensory 0 N/A N/A N/A N/A   Anorexia 1 06/22/22 07/15/22 Probable N/A Patient reported loss of appetite occurring intermittently since the day after her C7 T-DM1 infusion until 3 days after her C8 infusion.  Insomnia 2 06/22/22 Ongoing Probable Adjustments to medication schedule Patient reported moderate disturbed sleep.  Fatigue 2 06/22/22 Ongoing Probable Adjustments to medication schedule Grade 1 at baseline   PLAN: Next treatment visit is 08/23/22. Visit will include labs, a provider visit, and an infusion appointment. Patient reminded to bring remaining tucatinib/placebo tablets and cycle 9 medication diary to C10D1 appointment. Patient verbalized understanding. Research will meet with patient and accompany her to appointments on C10D1. Patient reminded to continue to fill out ePROs as they become available. Patient thanked for her time and continued voluntary participation in this study. Patient has been provided direct contact information and is encouraged to contact this nurse for any questions or concerns.   Vickii Penna, RN, BSN, CPN Clinical Research Nurse I [phone]  08/02/2022 10:50 AM

## 2022-08-03 ENCOUNTER — Encounter: Payer: Self-pay | Admitting: Hematology and Oncology

## 2022-08-09 ENCOUNTER — Inpatient Hospital Stay: Payer: 59 | Attending: Hematology and Oncology

## 2022-08-09 DIAGNOSIS — Z5111 Encounter for antineoplastic chemotherapy: Secondary | ICD-10-CM | POA: Insufficient documentation

## 2022-08-09 DIAGNOSIS — Z79899 Other long term (current) drug therapy: Secondary | ICD-10-CM | POA: Insufficient documentation

## 2022-08-09 DIAGNOSIS — R5383 Other fatigue: Secondary | ICD-10-CM | POA: Insufficient documentation

## 2022-08-09 DIAGNOSIS — C50411 Malignant neoplasm of upper-outer quadrant of right female breast: Secondary | ICD-10-CM | POA: Insufficient documentation

## 2022-08-09 DIAGNOSIS — Z006 Encounter for examination for normal comparison and control in clinical research program: Secondary | ICD-10-CM | POA: Insufficient documentation

## 2022-08-14 ENCOUNTER — Inpatient Hospital Stay: Payer: 59

## 2022-08-14 ENCOUNTER — Other Ambulatory Visit: Payer: Self-pay

## 2022-08-14 VITALS — BP 120/71 | HR 87 | Temp 98.0°F

## 2022-08-14 DIAGNOSIS — R5383 Other fatigue: Secondary | ICD-10-CM | POA: Diagnosis not present

## 2022-08-14 DIAGNOSIS — Z170 Estrogen receptor positive status [ER+]: Secondary | ICD-10-CM

## 2022-08-14 DIAGNOSIS — C50411 Malignant neoplasm of upper-outer quadrant of right female breast: Secondary | ICD-10-CM | POA: Diagnosis present

## 2022-08-14 DIAGNOSIS — Z79899 Other long term (current) drug therapy: Secondary | ICD-10-CM | POA: Diagnosis not present

## 2022-08-14 DIAGNOSIS — Z006 Encounter for examination for normal comparison and control in clinical research program: Secondary | ICD-10-CM | POA: Diagnosis present

## 2022-08-14 DIAGNOSIS — Z5111 Encounter for antineoplastic chemotherapy: Secondary | ICD-10-CM | POA: Diagnosis present

## 2022-08-14 DIAGNOSIS — Z95828 Presence of other vascular implants and grafts: Secondary | ICD-10-CM

## 2022-08-14 MED ORDER — GOSERELIN ACETATE 3.6 MG ~~LOC~~ IMPL
3.6000 mg | DRUG_IMPLANT | Freq: Once | SUBCUTANEOUS | Status: AC
  Administered 2022-08-14: 3.6 mg via SUBCUTANEOUS
  Filled 2022-08-14: qty 3.6

## 2022-08-14 MED ORDER — CYANOCOBALAMIN 1000 MCG/ML IJ SOLN
1000.0000 ug | Freq: Once | INTRAMUSCULAR | Status: AC
  Administered 2022-08-14: 1000 ug via INTRAMUSCULAR
  Filled 2022-08-14: qty 1

## 2022-08-16 ENCOUNTER — Other Ambulatory Visit: Payer: Self-pay | Admitting: Hematology and Oncology

## 2022-08-16 ENCOUNTER — Other Ambulatory Visit: Payer: Self-pay | Admitting: *Deleted

## 2022-08-16 ENCOUNTER — Encounter: Payer: Self-pay | Admitting: Hematology and Oncology

## 2022-08-16 ENCOUNTER — Ambulatory Visit
Admission: RE | Admit: 2022-08-16 | Discharge: 2022-08-16 | Disposition: A | Discharge status: Home / Self Care | Payer: 59 | Source: Ambulatory Visit | Attending: Hematology and Oncology | Admitting: Hematology and Oncology

## 2022-08-16 ENCOUNTER — Telehealth: Payer: Self-pay | Admitting: *Deleted

## 2022-08-16 DIAGNOSIS — N6320 Unspecified lump in the left breast, unspecified quadrant: Secondary | ICD-10-CM

## 2022-08-16 DIAGNOSIS — Z170 Estrogen receptor positive status [ER+]: Secondary | ICD-10-CM

## 2022-08-16 DIAGNOSIS — N6331 Unspecified lump in axillary tail of the right breast: Secondary | ICD-10-CM

## 2022-08-16 NOTE — Telephone Encounter (Signed)
Pt called to c/o right palpable axillary mass noted on physical exam. Pt has hx of right breast cancer. Pt scheduled for L [place] today at 1:50pm. Dr. Lindi Adie notified. ORders placed for R US/bx Request sent to [facility] scheduling to see if pt can be worked in for the right side today at the time of her left sided [place]. Informed pt will return call.

## 2022-08-17 ENCOUNTER — Encounter: Payer: Self-pay | Admitting: *Deleted

## 2022-08-19 NOTE — Progress Notes (Signed)
Patient Care Team: Dian Queen, MD as PCP - General (Obstetrics and Gynecology) Dian Queen, MD (Obstetrics and Gynecology) Mauro Kaufmann, RN as Oncology Nurse Navigator Rockwell Germany, RN as Oncology Nurse Navigator Nicholas Lose, MD as Consulting Physician (Hematology and Oncology) Gery Pray, MD as Consulting Physician (Radiation Oncology) Rolm Bookbinder, MD as Consulting Physician (General Surgery)  DIAGNOSIS: No diagnosis found.  SUMMARY OF ONCOLOGIC HISTORY: Oncology History  Malignant neoplasm of upper-outer quadrant of right breast in female, estrogen receptor positive ([place])  06/23/2021 Initial Diagnosis   Palpable lump in the right breast. Diagnostic mammogram and [place]: a 3.8 cm mass at the retroareolar right breast, 1 prominent right axillary lymph node with a 4 mm cortex, 1 other which is borderline measuring 3 mm. 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  3 o'clock position: Biopsy fibroadenoma   07/12/2021 -  Neo-Adjuvant Chemotherapy   Neoadjuvant chemotherapy with TCH Perjeta 6 cycles followed by Herceptin Perjeta maintenance versus Kadcyla maintenance (based on response to neoadjuvant chemo) for 1 year   07/28/2021 Genetic Testing   Negative hereditary cancer genetic testing: no pathogenic variants detected in [facility] CancerNext-Expanded +RNAinsight Panel.  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  The report date is July 28, 2021.   The CancerNext-Expanded gene panel offered by [facility] and includes sequencing, rearrangement, and RNA analysis for the following 77 genes: AIP, ALK, APC, ATM, AXIN2, BAP1, BARD1, BLM, BMPR1A, BRCA1, BRCA2, BRIP1, CDC73, CDH1, CDK4, CDKN1B, CDKN2A, CHEK2, CTNNA1, DICER1, FANCC, FH, FLCN, GALNT12, KIF1B, LZTR1, MAX, MEN1, MET, MLH1, MSH2, MSH3, MSH6, MUTYH, NBN, NF1, NF2, NTHL1, PALB2, PHOX2B, PMS2, POT1,  PRKAR1A, PTCH1, PTEN, RAD51C, RAD51D, RB1, RECQL, RET, SDHA, SDHAF2, SDHB, SDHC, SDHD, SMAD4, SMARCA4, SMARCB1, SMARCE1, STK11, SUFU, TMEM127, TP53, TSC1, TSC2, VHL and XRCC2 (sequencing and deletion/duplication); EGFR, EGLN1, HOXB13, KIT, MITF, PDGFRA, POLD1, and POLE (sequencing only); EPCAM and GREM1 (deletion/duplication only).    12/06/2021 Surgery   Bilateral Mastectomies: Left mastectomy: Benign Right mastectomy: Multifocal ILC 1.8 cm, 0.9 cm, margins negative, 1/2 lymph nodes positive, ER 50%, PR 30%, HER2 1+ IHC   12/26/2021 - 12/29/2021 Chemotherapy   Patient is on Treatment Plan : BREAST ADO-Trastuzumab Emtansine (Kadcyla) q21d       CHIEF COMPLIANT: Follow-up of right breast cancer on Kadcyla.   INTERVAL HISTORY: Jenna Cross is a 36 y.o with the above- mentioned right breast cancer. Currently on treatment with Kadcyla. She presents to the clinic today for treatment and a follow-up.      ALLERGIES:  is allergic to wound dressings.  MEDICATIONS:  Current Outpatient Medications  Medication Sig Dispense Refill   acetaminophen (TYLENOL) 500 MG tablet Take 1,000 mg by mouth every 6 (six) hours as needed for mild pain or headache.     ALPRAZolam (XANAX) 0.5 MG tablet Take 0.5 mg by mouth 3 (three) times daily as needed for anxiety.     clindamycin (CLINDAGEL) 1 % gel Apply topically 2 (two) times daily.     diphenoxylate-atropine (LOMOTIL) 2.5-0.025 MG tablet Take 1 tablet by mouth 3 (three) times daily as needed for diarrhea or loose stools. 30 tablet 3   Goserelin Acetate (ZOLADEX [place])      hydrocortisone cream 0.5 % Apply 1 Application topically as needed for itching.     Investigational tucatanib/placebo 150 MG tablet (ONT-380,NSC #[id number]) [facility] [id number] Take 2 tablets (300 mg total) 2 (two) times dailyfor 21 days.  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 Tucatinib  or placebo should take orally twice daily with or without food, approximately 12 hours apart between doses. Tablets must be  swallowed whole and may not be crushed, chewed or dissolved in liquid. CAUTION: Chemotherapy/Biotherapy 84 tablet 0   letrozole (FEMARA) 2.5 MG tablet Take 1 tablet (2.5 mg total) by mouth daily. 90 tablet 3   lidocaine-prilocaine (EMLA) cream lidocaine-prilocaine 2.5 %-2.5 % topical cream  APPLY TO AFFECTED AREA ONCE AS DIRECTED     loratadine (CLARITIN) 10 MG tablet Take 10 mg by mouth daily as needed for allergies.     LORazepam (ATIVAN) 0.5 MG tablet TAKE 1 TABLET (0.5 MG TOTAL) BY MOUTH AT BEDTIME AS NEEDED (NAUSEA OR VOMITING).     ondansetron (ZOFRAN) 8 MG tablet Take 1 tablet (8 mg total) by mouth every 8 (eight) hours as needed for nausea. 30 tablet 3   valACYclovir (VALTREX) 500 MG tablet Take 500 mg by mouth every evening.     venlafaxine XR (EFFEXOR-XR) 37.5 MG 24 hr capsule Take 1 capsule (37.5 mg total) by mouth daily with breakfast. 90 capsule 1   XARELTO 20 MG TABS tablet TAKE 1 TABLET BY MOUTH DAILY WITH SUPPER. 30 tablet 5   No current facility-administered medications for this visit.    PHYSICAL EXAMINATION: ECOG PERFORMANCE STATUS: {CHL ONC ECOG PS:[id number]}  There were no vitals filed for this visit. There were no vitals filed for this visit.  BREAST:*** No palpable masses or nodules in either right or left breasts. No palpable axillary supraclavicular or infraclavicular adenopathy no breast tenderness or nipple discharge. (exam performed in the presence of a chaperone)  LABORATORY DATA:  I have reviewed the data as listed    Latest Ref Rng & Units 08/02/2022    8:30 AM 07/12/2022    8:38 AM 06/21/2022    9:08 AM  CMP  Glucose 70 - 99 mg/dL 91  98  106   BUN 6 - 20 mg/dL _0 Creatinine 0.44 - 1.00 mg/dL 0.97  0.96  0.87   Sodium 135 - 145 mmol/L 140  139  139   Potassium 3.5 - 5.1 mmol/L 3.7  3.9  3.7   Chloride 98 - 111 mmol/L 109  106  106   CO2 22 - 32 mmol/L _1 Calcium 8.9 - 10.3 mg/dL 9.0  8.9  9.4   Total Protein 6.5 - 8.1 g/dL 6.9   7.2  6.9   Total Bilirubin 0.3 - 1.2 mg/dL 0.6  0.5  0.5   Alkaline Phos 38 - 126 U/L 110  100  105   AST 15 - 41 U/L _2 ALT 0 - 44 U/L _3 Lab Results  Component Value Date   WBC 5.3 08/02/2022   HGB 12.7 08/02/2022   HCT 36.8 08/02/2022   MCV 92.0 08/02/2022   PLT 200 08/02/2022   NEUTROABS 3.2 08/02/2022    ASSESSMENT & PLAN:  No problem-specific Assessment & Plan notes found for this encounter.    No orders of the defined types were placed in this encounter.  The patient has a good understanding of the overall plan. she agrees with it. she will call with any problems that may develop before the next visit here. Total time spent: 30 mins including face to face time and time spent for planning, charting and co-ordination of care   [place],  CMA 08/22/22

## 2022-08-22 ENCOUNTER — Ambulatory Visit: Payer: 59

## 2022-08-23 ENCOUNTER — Inpatient Hospital Stay (HOSPITAL_BASED_OUTPATIENT_CLINIC_OR_DEPARTMENT_OTHER): Payer: 59 | Admitting: Hematology and Oncology

## 2022-08-23 ENCOUNTER — Inpatient Hospital Stay: Payer: 59

## 2022-08-23 ENCOUNTER — Other Ambulatory Visit: Payer: Self-pay

## 2022-08-23 ENCOUNTER — Other Ambulatory Visit: Payer: Self-pay | Admitting: *Deleted

## 2022-08-23 VITALS — BP 114/74 | HR 93 | Temp 97.9°F | Resp 18 | Ht 64.0 in | Wt 125.0 lb

## 2022-08-23 DIAGNOSIS — C50411 Malignant neoplasm of upper-outer quadrant of right female breast: Secondary | ICD-10-CM | POA: Diagnosis not present

## 2022-08-23 DIAGNOSIS — Z170 Estrogen receptor positive status [ER+]: Secondary | ICD-10-CM

## 2022-08-23 DIAGNOSIS — Z006 Encounter for examination for normal comparison and control in clinical research program: Secondary | ICD-10-CM | POA: Diagnosis not present

## 2022-08-23 DIAGNOSIS — Z5181 Encounter for therapeutic drug level monitoring: Secondary | ICD-10-CM

## 2022-08-23 DIAGNOSIS — Z95828 Presence of other vascular implants and grafts: Secondary | ICD-10-CM

## 2022-08-23 DIAGNOSIS — Z5111 Encounter for antineoplastic chemotherapy: Secondary | ICD-10-CM | POA: Diagnosis not present

## 2022-08-23 LAB — CBC WITH DIFFERENTIAL (CANCER CENTER ONLY)
Abs Immature Granulocytes: 0.02 10*3/uL (ref 0.00–0.07)
Basophils Absolute: 0.1 10*3/uL (ref 0.0–0.1)
Basophils Relative: 1 %
Eosinophils Absolute: 0.4 10*3/uL (ref 0.0–0.5)
Eosinophils Relative: 6 %
HCT: 44 % (ref 36.0–46.0)
Hemoglobin: 15.2 g/dL — ABNORMAL HIGH (ref 12.0–15.0)
Immature Granulocytes: 0 %
Lymphocytes Relative: 27 %
Lymphs Abs: 1.9 10*3/uL (ref 0.7–4.0)
MCH: 31.3 pg (ref 26.0–34.0)
MCHC: 34.5 g/dL (ref 30.0–36.0)
MCV: 90.5 fL (ref 80.0–100.0)
Monocytes Absolute: 0.4 10*3/uL (ref 0.1–1.0)
Monocytes Relative: 6 %
Neutro Abs: 4.3 10*3/uL (ref 1.7–7.7)
Neutrophils Relative %: 60 %
Platelet Count: 252 10*3/uL (ref 150–400)
RBC: 4.86 MIL/uL (ref 3.87–5.11)
RDW: 13.8 % (ref 11.5–15.5)
WBC Count: 7.1 10*3/uL (ref 4.0–10.5)
nRBC: 0 % (ref 0.0–0.2)

## 2022-08-23 LAB — CMP (CANCER CENTER ONLY)
ALT: 28 U/L (ref 0–44)
AST: 37 U/L (ref 15–41)
Albumin: 4.8 g/dL (ref 3.5–5.0)
Alkaline Phosphatase: 107 U/L (ref 38–126)
Anion gap: 9 (ref 5–15)
BUN: 5 mg/dL — ABNORMAL LOW (ref 6–20)
CO2: 22 mmol/L (ref 22–32)
Calcium: 10 mg/dL (ref 8.9–10.3)
Chloride: 109 mmol/L (ref 98–111)
Creatinine: 0.99 mg/dL (ref 0.44–1.00)
GFR, Estimated: >60 mL/min (ref >60)
Glucose, Bld: 79 mg/dL (ref 70–99)
Potassium: 3.5 mmol/L (ref 3.5–5.1)
Sodium: 140 mmol/L (ref 135–145)
Total Bilirubin: 0.8 mg/dL (ref 0.3–1.2)
Total Protein: 8.3 g/dL — ABNORMAL HIGH (ref 6.5–8.1)

## 2022-08-23 LAB — PREGNANCY, URINE: Preg Test, Ur: NEGATIVE

## 2022-08-23 MED ORDER — SODIUM CHLORIDE 0.9 % IV SOLN
3.6000 mg/kg | Freq: Once | INTRAVENOUS | Status: AC
  Administered 2022-08-23: 220 mg via INTRAVENOUS
  Filled 2022-08-23: qty 8

## 2022-08-23 MED ORDER — SODIUM CHLORIDE 0.9% FLUSH
10.0000 mL | Freq: Once | INTRAVENOUS | Status: AC
  Administered 2022-08-23: 10 mL

## 2022-08-23 MED ORDER — DIPHENHYDRAMINE HCL 25 MG PO CAPS
50.0000 mg | ORAL_CAPSULE | Freq: Once | ORAL | Status: AC
  Administered 2022-08-23: 50 mg via ORAL
  Filled 2022-08-23: qty 2

## 2022-08-23 MED ORDER — HEPARIN SOD (PORK) LOCK FLUSH 100 UNIT/ML IV SOLN
500.0000 [IU] | Freq: Once | INTRAVENOUS | Status: AC | PRN
  Administered 2022-08-23: 500 [IU]

## 2022-08-23 MED ORDER — ACETAMINOPHEN 325 MG PO TABS
650.0000 mg | ORAL_TABLET | Freq: Once | ORAL | Status: AC
  Administered 2022-08-23: 650 mg via ORAL
  Filled 2022-08-23: qty 2

## 2022-08-23 MED ORDER — SODIUM CHLORIDE 0.9% FLUSH
10.0000 mL | INTRAVENOUS | Status: DC | PRN
  Administered 2022-08-23: 10 mL

## 2022-08-23 MED ORDER — ONDANSETRON HCL 4 MG/2ML IJ SOLN
8.0000 mg | Freq: Once | INTRAMUSCULAR | Status: AC
  Administered 2022-08-23: 8 mg via INTRAVENOUS
  Filled 2022-08-23: qty 4

## 2022-08-23 MED ORDER — INV-TUCATINIB/PLACEBO 150 MG TABLET ALLIANCE A011801: 300.0000 mg | ORAL_TABLET | Freq: Two times a day (BID) | ORAL | 0 refills | Status: AC

## 2022-08-23 MED ORDER — SODIUM CHLORIDE 0.9 % IV SOLN: Freq: Once | INTRAVENOUS | Status: AC

## 2022-08-23 NOTE — Assessment & Plan Note (Signed)
Palpable lump in the right breast. Diagnostic mammogram and [place]: a 3.8 cm mass at the retroareolar right breast, 1 prominent right axillary lymph node with a 4 mm cortex, 1 other which is borderline measuring 3 mm. 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  3 o'clock position: Biopsy fibroadenoma   Treatment plan: 1. Neoadjuvant chemotherapy with TCH Perjeta 6 cycles followed by Herceptin Perjeta maintenance versus Kadcyla maintenance (based on response to neoadjuvant chemo) for 1 year 2. Bilateral Mastectomies: Left mastectomy: Benign Right mastectomy: Multifocal ILC 1.8 cm, 0.9 cm, margins negative, 1/2 lymph nodes positive, ER 50%, PR 30%, HER2 1+ IHC 3.  Compass HER2 clinical trial: Randomization between Kadcyla versus Kadcyla plus Tucatinib 4. Followed by adjuvant radiation therapy  5.  Followed by antiestrogen therapy   ------------------------------------------------------------------------------------------------------------------------ Current treatment: Kadcyla cycle 9 maintenance (Compass HER2 clinical trial) + Tucatinib vs Placebo Echocardiogram March 2023: EF 55 to 60%   Hospitalization 01/22/2022-01/26/2022 abscess/cellulitis required removal of the implant The plan is now a delayed reconstruction   Adverse Effects: Tolerating it well 1. Mild diarrhea: Same as before 2. mild nausea couple days after treatment 3.  Fatigue: Makes her take a nap. 4.  Weakness of the hands: Encouraged her to use weights for exercise.  We discussed PT referral.  She wants to wait on that at this time.   Worsening fatigue: On monthly B12 injections  Ultrasound bilateral breasts 08/16/2022: Interval decrease in size of the left breast fat necrosis.  At the site of palpable concern there is no sonographic abnormality.

## 2022-08-23 NOTE — Progress Notes (Signed)
Per Dr Lindi Adie, ok to proceed with ECHO results from June.  New appt made for 09/03/22.  Pt aware

## 2022-08-23 NOTE — Research (Signed)
CompassHER2 Residual Disease, A Double-Blinded, Phase III Randomized Trial of T-DM1 and Placebo Compared with T-DMI and Tucatinib  Patient presented to clinic unaccompanied this morning for cycle 10 day 1.  LABS: CBC and CMP collected via port-a-cath per consent and study protocol. Patient tolerated well without complaint. Urine pregnancy collected per institutional guidelines.   VITAL SIGNS: Completed, including weight. See vital signs flowsheet.   H&P: Completed by Dr Lindi Adie. See clinic note dated 08/23/22. Provider reviewed lab results and cleared patient to continue treatment.   RESEARCH: Concomitant medications reviewed. Patient denied any new medications. Medication list updated and verified with patient.  Solicited and other adverse events assessed per protocol. Patient reported that she again had mild nausea (grade 1) and mild anorexia (grade 1) for three days following her T-DM1 infusion; nausea (grade 1) returned towards the middle of the cycle, along with mild diarrhea (grade 1) and one episode of vomiting (grade 1). Nausea and diarrhea are ongoing, but symptoms are improving. Patient believes she may have had another "GI bug" that she caught from her young sons; she stated her entire household become ill at one point. She noted that some of her ongoing AEs improved this cycle, while several remained bothersome; patient stated that her moderate fatigue (grade 2) had improved and returned to baseline (grade 1). Patient noted ongoing mild muscle weakness (grade 1) in her arms as well as ongoing moderate disturbed sleep/insomnia (grade 2). Patient's breast pain (grade 1) and bruising (grade 1) were unchanged and ongoing. Patient had a follow-up ultrasound on her left axilla on 08/16/22 that was normal. Patient also had her right axilla evaluated due to finding a "lump" on self breast exam; results were benign. Per Dr Lindi Adie, this remains unrelated to study treatment. See table below  Patient  returned remaining cycle 9 tucatinib/placebo and medication diary. She returned two bottles, one empty bottle and one with 48 tablets remaining (0 + 48 = 48; 100 total tablets dispensed - 48 remaining = 52 tablets taken). Medication diary reflects the same: 52 tablets taken; 26 total doses. Remaining tucatinib/placebo returned to pharmacy.  Cycle 10 tucatinib/placebo dispensed by pharmacy. Research RN verified that drug had correct patient name and study ID number. Drug was dispensed in two bottles of 50 '150mg'$  tablets, lot #[id number] (bottle 1: [id number], bottle 2: [id number]), exp 05/2023. Patient instructed to continue taking two tablets ('300mg'$  total) orally twice daily and to continue recording doses and side effects on medication diary. Reinforced importance of compliance with study medication; patient reports the alarms on her phone continue to be beneficial. Patient verbalized understanding. Patient was given cycle 10 medication diary.   INFUSION: Patient received clinic standard pre-medications for T-DM1 treatment -- '650mg'$  acetaminophen PO and '50mg'$  diphenhydramine PO. PKs not required this cycle per protocol. Patient tolerated infusion well without complaint. Patient took morning dose of tucatinib/placebo at 0930 from cycle 10 supply.   ADVERSE EVENT LOG: Study/Protocol: [facility] [id number] CompassHER2 Cycle 10  Baseline: anxiety (grade 2), fatigue (grade 1), herpes simplex virus, hypotension (grade 1), vascular access complication (grade 3). Not reportable (unrelated/unlikely to be related to study treatment and < grade 3): breast pain (grade 1), bruising (grade 1), muscle weakness -- upper limb (grade 1).  Event (solicited vs other reportable) Grade CTCAE v5.0 Onset Date Resolved Date Attribution to Study Treatment Treatment Comments  Neutrophil count decreased 0 N/A N/A N/A N/A   Platelet count decreased 0 N/A N/A N/A N/A   Rash (maculo-papular) 0 N/A N/A N/A N/A  Epistaxis 0 N/A N/A N/A N/A    Diarrhea  1 08/16/22 Ongoing Unrelated N/A Patient reported a baseline of 2-3 stools/day. Possible GI bug; symptoms are improving.  Nausea 1 08/03/22 08/05/22 Possible Ondansetron Patient reported mild nausea x3 days after T-DM1 infusion.  Nausea 1 08/16/22 Ongoing Unrelated Ondansetron Possible GI bug; symptoms are improving.  Vomiting 1 08/16/22 08/16/22 Unrelated N/A Patient reported 1 episode of vomiting. Possible GI bug; symptoms are improving.  Bilirubin increased 0 N/A N/A N/A N/A    AST increased 0 N/A N/A N/A N/A    ALT increased 0 N/A N/A N/A N/A    Anemia 0 N/A N/A N/A N/A    Mucositis oral 0 N/A N/A N/A N/A    Palmar-plantar erythrodysesthesia 0 N/A N/A N/A N/A    Peripheral neuropathy -- motor 0 N/A N/A N/A N/A    Peripheral neuropathy -- sensory 0 N/A N/A N/A N/A   Anorexia 1 08/03/22 08/05/22 Probable N/A Patient reported loss of appetite x3 days after T-DM1 infusion.  Insomnia 2 06/22/22 Ongoing Probable Adjustments to medication schedule Patient reported moderate disturbed sleep.  Fatigue 2 06/22/22 08/08/22 Probable Adjustments to medication schedule Patient reported return to baseline (grade 1).   PLAN: Next treatment visit is 09/13/22. Visit will include labs, a provider visit, and an infusion appointment. Patient reminded to bring remaining tucatinib/placebo tablets and cycle 10 medication diary to C11D1 appointment. Patient verbalized understanding. Research will meet with patient and accompany her to appointments on C11D1. Patient due for study-required ECHO; scheduled for 09/03/22 (window 11/23-12/17/23). Patient reminded to continue to fill out ePROs as they become available. Patient thanked for her time and continued voluntary participation in this study. Patient has been provided direct contact information and is encouraged to contact this nurse for any questions or concerns.   Vickii Penna, RN, BSN, CPN Clinical Research Nurse I [phone]  08/23/2022 10:10 AM

## 2022-08-25 ENCOUNTER — Ambulatory Visit: Payer: 59

## 2022-08-27 ENCOUNTER — Other Ambulatory Visit: Payer: Self-pay | Admitting: Hematology and Oncology

## 2022-08-27 DIAGNOSIS — Z170 Estrogen receptor positive status [ER+]: Secondary | ICD-10-CM

## 2022-08-27 NOTE — Therapy (Incomplete)
OUTPATIENT PHYSICAL THERAPY ONCOLOGY EVALUATION  Patient Name: Jenna Cross MRN: [id number] DOB:[birth year], 36 y.o., female Today's Date: 08/27/2022  END OF SESSION:   Past Medical History:  Diagnosis Date   Anemia    pregnancy related   Anxiety    Anxiety    no medication   Cancer ([place])    breast   COVID 09/2020   mild case   Family history of prostate cancer 07/17/2021   History of DVT (deep vein thrombosis)    in her neck   History of radiation therapy    Right breast 02/22/22-04/13/22- Dr. Gery Pray   Past Surgical History:  Procedure Laterality Date   BREAST IMPLANT EXCHANGE Right 01/25/2022   Procedure: BREAST IMPLANT REMOVAL;  Surgeon: Wallace Going, DO;  Location: [facility];  Service: Plastics;  Laterality: Right;  1 hour   BREAST RECONSTRUCTION WITH PLACEMENT OF TISSUE EXPANDER AND FLEX HD (ACELLULAR HYDRATED DERMIS) Bilateral 12/06/2021   Procedure: BREAST RECONSTRUCTION WITH PLACEMENT OF TISSUE EXPANDER AND FLEX HD (ACELLULAR HYDRATED DERMIS);  Surgeon: Wallace Going, DO;  Location: [facility];  Service: Plastics;  Laterality: Bilateral;   BREAST SURGERY     cyst removal from right breast    CESAREAN SECTION N/A 01/31/2021   Procedure: CESAREAN SECTION;  Surgeon: Dian Queen, MD;  Location: [place] [facility] LD [facility]S;  Service: Obstetrics;  Laterality: N/A;   CHG RAD GUIDED,PERCUT DRAINAGE,W/CATH PLACE  01/23/2022   excision of fibroadenoma of breast     PORTACATH PLACEMENT N/A 07/11/2021   Procedure: INSERTION PORT-A-CATH;  Surgeon: Rolm Bookbinder, MD;  Location: [place];  Service: General;  Laterality: N/A;   RADIOACTIVE SEED GUIDED AXILLARY SENTINEL LYMPH NODE Right 12/06/2021   Procedure: RADIOACTIVE SEED GUIDED RIGHT AXILLARY SENTINEL LYMPH NODE EXCISION;  Surgeon: Rolm Bookbinder, MD;  Location: [facility];  Service: General;  Laterality: Right;   REMOVAL OF BILATERAL TISSUE EXPANDERS WITH PLACEMENT OF  BILATERAL BREAST IMPLANTS Bilateral 01/01/2022   Procedure: REMOVAL OF BILATERAL TISSUE EXPANDERS WITH PLACEMENT OF BILATERAL BREAST IMPLANTS;  Surgeon: Wallace Going, DO;  Location: [place];  Service: Plastics;  Laterality: Bilateral;   SENTINEL NODE BIOPSY Right 12/06/2021   Procedure: RIGHT AXILLARY SENTINEL NODE BIOPSY;  Surgeon: Rolm Bookbinder, MD;  Location: [place];  Service: General;  Laterality: Right;   TONSILLECTOMY     and adenoids   TONSILLECTOMY AND ADENOIDECTOMY     TOTAL MASTECTOMY Bilateral 12/06/2021   Procedure: BILATERAL SKIN SPARING TOTAL MASTECTOMY;  Surgeon: Rolm Bookbinder, MD;  Location: [place];  Service: General;  Laterality: Bilateral;   [place] EXTRACTION     Patient Active Problem List   Diagnosis Date Noted   Breast asymmetry following reconstructive surgery 06/19/2022   Acquired absence of breast and absent nipple, bilateral 06/19/2022   Port-A-Cath in place 01/26/2022   Infection of right breast 01/22/2022   History of DVT (deep vein thrombosis) 01/22/2022   Breast cancer ([place]) 12/06/2021   Deep venous thrombosis ([place]) 09/26/2021   Genetic testing 08/01/2021   Family history of prostate cancer 07/17/2021   Malignant neoplasm of upper-outer quadrant of right breast in female, estrogen receptor positive ([place]) 06/27/2021   Breast tenderness in female 05/26/2021   Breast mass 05/26/2021   History of preterm premature rupture of membranes (PPROM) 01/31/2021   Preterm premature rupture of membranes (PPROM) with unknown onset of labor 01/27/2021   Vacuum extractor delivery, delivered 01/04/2020   Pregnancy 01/03/2020  Encounter for counseling 08/29/2018   Paresthesia 04/05/2016    PCP: Dian Queen, MD  REFERRING PROVIDER: Nicholas Lose, MD  REFERRING DIAG: s/p Right breast Cancer  THERAPY DIAG:  No diagnosis found.  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  She is s/p neoadjuvant chemotherapay x 6 cycles which started on 07/12/2021. She is s/p  bilateral mastectomy with  Right SLNB  and 1/2 LN's with immediate Expander reconstruction on 12/06/2021, radiation ended on 04/13/2022.  She had the expander exchange done on 01/01/2022. She experienced cellulitis/abscess in the right breast and on 01/25/2022 she had the right tissue expander removed.   PAIN:  Are you having pain? {yes/no:20286} NPRS scale: ***/10 Pain location: *** Pain orientation: {Pain Orientation:25161}  PAIN TYPE: {type:313116} Pain description: {PAIN DESCRIPTION:21022940}  Aggravating factors: *** Relieving factors: ***  PRECAUTIONS: Right UE lymphedema risk, prior cellulitis April 2023  WEIGHT BEARING RESTRICTIONS: No  FALLS:  Has patient fallen in last 6 months? {fallsyesno:27318}  LIVING ENVIRONMENT: Lives with: lives with their family, 2 young children Lives in: House/apartment Stairs: Yes; Internal: *** steps; {rails:26871} Has following equipment at home: {Assistive devices:23999}  OCCUPATION: Copy Writer  LEISURE: walking, playing with kids  HAND DOMINANCE: right   PRIOR LEVEL OF FUNCTION: Independent  PATIENT GOALS: ***   OBJECTIVE:  COGNITION: Overall cognitive status: Within functional limits for tasks assessed   PALPATION: ***  OBSERVATIONS / OTHER ASSESSMENTS: ***  SENSATION: Light touch: {intact/deficits:24005}   POSTURE: ***  UPPER EXTREMITY AROM/PROM:  A/PROM RIGHT   eval   Shoulder extension   Shoulder flexion   Shoulder abduction   Shoulder internal rotation   Shoulder external rotation     (Blank rows = not  tested)  A/PROM LEFT   eval  Shoulder extension   Shoulder flexion   Shoulder abduction   Shoulder internal rotation   Shoulder external rotation     (Blank rows = not tested)  CERVICAL AROM: All within normal limits:    Percent limited  Flexion   Extension   Right lateral flexion   Left lateral flexion   Right rotation   Left rotation     UPPER EXTREMITY STRENGTH: ***   LYMPHEDEMA ASSESSMENTS:   SURGERY TYPE/DATE: Bilateral Mastectomies with immediate reconstruction with expanders 12/06/2021,  01/01/2022 Expander exchange, 01/25/2022 Implant removed due to cellulitis/abcess  NUMBER OF LYMPH NODES REMOVED: 1/2  CHEMOTHERAPY: yes, neoadjuvant, now having Herceptin Maintenance  RADIATION:yes, ended 04/13/2022  HORMONE TREATMENT: ***  INFECTIONS: yes, right breast, hospitalized 4/17-4/20/2023  LYMPHEDEMA ASSESSMENTS:   LANDMARK RIGHT  eval  10 cm proximal to olecranon process   Olecranon process   10 cm proximal to ulnar styloid process   Just proximal to ulnar styloid process   Across hand at thumb web space   At base of 2nd digit   (Blank rows = not tested)  LANDMARK LEFT  eval  10 cm proximal to olecranon process   Olecranon process   10 cm proximal to ulnar styloid process   Just proximal to ulnar styloid process   Across hand at thumb web space   At base of 2nd digit   (Blank rows = not tested)   L-DEX LYMPHEDEMA SCREENING:  The patient was assessed using the L-Dex machine today to produce a lymphedema index baseline score. The patient will be reassessed on a regular basis (typically every 3 months) to obtain new L-Dex scores. If the score is > 6.5 points away from his/her baseline score indicating onset of subclinical lymphedema, it will be recommended to wear a compression garment for 4 weeks, 12 hours per day and then be reassessed. If the score continues to be > 6.5 points from baseline at reassessment, we will initiate lymphedema treatment. Assessing  in this manner has a 95% rate of preventing clinically significant lymphedema.    QUICK DASH SURVEY: ***   TODAY'S TREATMENT:                                                                                                                                         DATE: ***  PATIENT EDUCATION:  Education details: *** Person educated: {Person educated:25204} Education method: {Education Method:25205} Education comprehension: {Education Comprehension:25206}  HOME EXERCISE PROGRAM: ***  ASSESSMENT:  CLINICAL IMPRESSION: Patient is a 36 y.o. female who was seen today for physical therapy evaluation and treatment for reassessment and  limitations in shoulder ROM s/p bilateral mastectomies in addition to expander exchange surgery and later implant removal on 4/40/2023. She is also s/p radiation..   OBJECTIVE IMPAIRMENTS: {opptimpairments:25111}.   ACTIVITY LIMITATIONS: {activitylimitations:27494}  PARTICIPATION LIMITATIONS: {participationrestrictions:25113}  PERSONAL FACTORS: {Personal factors:25162} are also affecting patient's functional outcome.   REHAB POTENTIAL: Good  CLINICAL DECISION MAKING: Stable/uncomplicated  EVALUATION COMPLEXITY: Low  GOALS: Goals reviewed with patient? {yes/no:20286}  STG=LONG TERM GOALS: Target date: ***  *** Baseline:  Goal status: {GOALSTATUS:25110}  2.  *** Baseline:  Goal status: {GOALSTATUS:25110}  3.  *** Baseline:  Goal status: {GOALSTATUS:25110}  4.  *** Baseline:  Goal status: {GOALSTATUS:25110}  5.  *** Baseline:  Goal status: {GOALSTATUS:25110}  6.  *** Baseline:  Goal status: {GOALSTATUS:25110}  PLAN:  PT FREQUENCY: {rehab frequency:25116}  PT DURATION: {rehab  duration:25117}  PLANNED INTERVENTIONS: {rehab planned interventions:25118::"Therapeutic exercises","Therapeutic activity","Neuromuscular re-education","Balance training","Gait training","Patient/Family education","Self Care","Joint  mobilization"}  PLAN FOR NEXT SESSION: ***   Claris Pong, PT 08/27/2022, 10:27 AM

## 2022-08-28 ENCOUNTER — Other Ambulatory Visit: Payer: Self-pay | Admitting: *Deleted

## 2022-08-28 ENCOUNTER — Encounter: Payer: Self-pay | Admitting: Hematology and Oncology

## 2022-08-28 ENCOUNTER — Ambulatory Visit: Payer: 59

## 2022-08-28 MED ORDER — CLINDAMYCIN PHOSPHATE 1 % EX GEL: Freq: Two times a day (BID) | CUTANEOUS | 1 refills | Status: DC

## 2022-09-03 ENCOUNTER — Ambulatory Visit (HOSPITAL_COMMUNITY)
Admission: RE | Admit: 2022-09-03 | Discharge: 2022-09-03 | Disposition: A | Discharge status: Home / Self Care | Payer: 59 | Source: Ambulatory Visit | Attending: Hematology and Oncology | Admitting: Hematology and Oncology

## 2022-09-03 DIAGNOSIS — Z5181 Encounter for therapeutic drug level monitoring: Secondary | ICD-10-CM | POA: Insufficient documentation

## 2022-09-03 DIAGNOSIS — Z79899 Other long term (current) drug therapy: Secondary | ICD-10-CM | POA: Insufficient documentation

## 2022-09-03 DIAGNOSIS — Z0189 Encounter for other specified special examinations: Secondary | ICD-10-CM | POA: Diagnosis not present

## 2022-09-03 LAB — ECHOCARDIOGRAM COMPLETE
Area-P 1/2: 4.71 cm2
Calc EF: 52.9 %
S' Lateral: 3.4 cm
Single Plane A2C EF: 50.4 %
Single Plane A4C EF: 52.3 %

## 2022-09-03 NOTE — Progress Notes (Signed)
Echocardiogram 2D Echocardiogram has been performed.  Jenna Cross 09/03/2022, 9:57 AM

## 2022-09-04 NOTE — Therapy (Signed)
OUTPATIENT PHYSICAL THERAPY ONCOLOGY EVALUATION  Patient Name: Jenna Cross MRN: [id number] DOB:Feb 20, 1986, 36 y.o., female Today's Date: 09/05/2022  END OF SESSION:  PT End of Session - 09/05/22 1104     Visit Number 1    Number of Visits 12    Date for PT Re-Evaluation 10/17/22    PT Start Time 1106    PT Stop Time 1153    PT Time Calculation (min) 47 min    Activity Tolerance Patient tolerated treatment well    Behavior During Therapy [facility] for tasks assessed/performed             Past Medical History:  Diagnosis Date   Anemia    pregnancy related   Anxiety    Anxiety    no medication   Cancer ([place])    breast   COVID 09/2020   mild case   Family history of prostate cancer 07/17/2021   History of DVT (deep vein thrombosis)    in her neck   History of radiation therapy    Right breast 02/22/22-04/13/22- Dr. Gery Pray   Past Surgical History:  Procedure Laterality Date   BREAST IMPLANT EXCHANGE Right 01/25/2022   Procedure: BREAST IMPLANT REMOVAL;  Surgeon: Wallace Going, DO;  Location: [place];  Service: Plastics;  Laterality: Right;  1 hour   BREAST RECONSTRUCTION WITH PLACEMENT OF TISSUE EXPANDER AND FLEX HD (ACELLULAR HYDRATED DERMIS) Bilateral 12/06/2021   Procedure: BREAST RECONSTRUCTION WITH PLACEMENT OF TISSUE EXPANDER AND FLEX HD (ACELLULAR HYDRATED DERMIS);  Surgeon: Wallace Going, DO;  Location: [place];  Service: Plastics;  Laterality: Bilateral;   BREAST SURGERY     cyst removal from right breast    CESAREAN SECTION N/A 01/31/2021   Procedure: CESAREAN SECTION;  Surgeon: Dian Queen, MD;  Location: [place] LD ORS;  Service: Obstetrics;  Laterality: N/A;   CHG RAD GUIDED,PERCUT DRAINAGE,W/CATH PLACE  01/23/2022   excision of fibroadenoma of breast     PORTACATH PLACEMENT N/A 07/11/2021   Procedure: INSERTION PORT-A-CATH;  Surgeon: Rolm Bookbinder, MD;  Location: [place];  Service: General;   Laterality: N/A;   RADIOACTIVE SEED GUIDED AXILLARY SENTINEL LYMPH NODE Right 12/06/2021   Procedure: RADIOACTIVE SEED GUIDED RIGHT AXILLARY SENTINEL LYMPH NODE EXCISION;  Surgeon: Rolm Bookbinder, MD;  Location: [place];  Service: General;  Laterality: Right;   REMOVAL OF BILATERAL TISSUE EXPANDERS WITH PLACEMENT OF BILATERAL BREAST IMPLANTS Bilateral 01/01/2022   Procedure: REMOVAL OF BILATERAL TISSUE EXPANDERS WITH PLACEMENT OF BILATERAL BREAST IMPLANTS;  Surgeon: Wallace Going, DO;  Location: [place];  Service: Plastics;  Laterality: Bilateral;   SENTINEL NODE BIOPSY Right 12/06/2021   Procedure: RIGHT AXILLARY SENTINEL NODE BIOPSY;  Surgeon: Rolm Bookbinder, MD;  Location: [place];  Service: General;  Laterality: Right;   TONSILLECTOMY     and adenoids   TONSILLECTOMY AND ADENOIDECTOMY     TOTAL MASTECTOMY Bilateral 12/06/2021   Procedure: BILATERAL SKIN SPARING TOTAL MASTECTOMY;  Surgeon: Rolm Bookbinder, MD;  Location: [place];  Service: General;  Laterality: Bilateral;   Petronila EXTRACTION     Patient Active Problem List   Diagnosis Date Noted   Breast asymmetry following reconstructive surgery 06/19/2022   Acquired absence of breast and absent nipple, bilateral 06/19/2022   Port-A-Cath in place 01/26/2022   Infection of right breast 01/22/2022   History of DVT (deep vein thrombosis) 01/22/2022   Breast cancer ([place]) 12/06/2021   Deep venous  thrombosis ([place]) 09/26/2021   Genetic testing 08/01/2021   Family history of prostate cancer 07/17/2021   Malignant neoplasm of upper-outer quadrant of right breast in female, estrogen receptor positive ([place]) 06/27/2021   Breast tenderness in female 05/26/2021   Breast mass 05/26/2021   History of preterm premature rupture of membranes (PPROM) 01/31/2021   Preterm premature rupture of membranes (PPROM) with unknown onset of labor 01/27/2021   Vacuum extractor delivery,  delivered 01/04/2020   Pregnancy 01/03/2020   Encounter for counseling 08/29/2018   Paresthesia 04/05/2016    PCP: Dian Queen, MD  REFERRING PROVIDER: Nicholas Lose, MD  REFERRING DIAG: s/p Right breast Cancer  THERAPY DIAG:  Malignant neoplasm of upper-outer quadrant of right breast in female, estrogen receptor positive ([place])  Abnormal posture  ONSET DATE: 12/06/2021  Rationale for Evaluation and Treatment: Rehabilitation  SUBJECTIVE:                                                                                                                                                                                           SUBJECTIVE STATEMENT: I had the implant removed and healed up fine. I had the radiation and since then I have noticed more tightness under my arm. I have been doing some stretching at my house and some pilates structured to me. I dont notice  any swelling. I have a ridge near my axilla, but it was scanned and they said it was not malignant.  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  She is s/p neoadjuvant chemotherapay x 6 cycles which started on 07/12/2021. She is s/p  bilateral mastectomy with  Right SLNB  and 1/2 LN's with immediate Expander reconstruction on 12/06/2021, radiation ended on 04/13/2022.  She had the expander exchange done on 01/01/2022. She experienced cellulitis/abscess in the right breast and on 01/25/2022 she had the right tissue expander removed.   PAIN:  Are you having pain? No just tightness  PRECAUTIONS: Right UE lymphedema risk, prior cellulitis April 2023, on Xarelto due to DVT from port. Will be removed in February.(NO CUPPING)  WEIGHT BEARING RESTRICTIONS: No  FALLS:  Has patient fallen in last 6 months? No  LIVING ENVIRONMENT: Lives with: lives with their family, 2 young children 1 and a half and 2 and a half Lives in: House/apartment Stairs: Yes; Internal: 15 steps; can reach both Has  following equipment at home: None  OCCUPATION: Copy Writer works Mon-Wed  LEISURE: walking, playing with kids  HAND DOMINANCE: right   PRIOR LEVEL OF FUNCTION: Independent  PATIENT GOALS: Improve mobility, feel more back to normal  OBJECTIVE:  COGNITION: Overall cognitive status: Within functional limits for tasks assessed   PALPATION: Palpable nodule right axillary region anteriorly  OBSERVATIONS / OTHER ASSESSMENTS: pt with noted axillary breast tissue, and small nodule anteriorly that ahas been screened and is negative, mastectomy incision healed  SENSATION: with more tissue medially Light touch: Appears intactbut not all the way   POSTURE: forward head, rounded shoulders  UPPER EXTREMITY AROM/PROM:  A/PROM RIGHT   eval   Shoulder extension 56  Shoulder flexion 145  Shoulder abduction 110  Shoulder internal rotation 60  Shoulder external rotation 99    (Blank rows = not tested)  A/PROM LEFT   eval  Shoulder extension 62  Shoulder flexion 165  Shoulder abduction 180  Shoulder internal rotation 74  Shoulder external rotation 94    (Blank rows = not tested)  CERVICAL AROM: All within normal limits:     UPPER EXTREMITY STRENGTH: WNL   LYMPHEDEMA ASSESSMENTS:   SURGERY TYPE/DATE: Bilateral Mastectomies with immediate reconstruction with expanders 12/06/2021,  01/01/2022 Expander exchange, 01/25/2022 Implant removed due to cellulitis/abcess  NUMBER OF LYMPH NODES REMOVED: 1/2  CHEMOTHERAPY: yes, neoadjuvant, now having Herceptin Maintenance  RADIATION:yes, ended 04/13/2022  HORMONE TREATMENT: Letrozole  INFECTIONS: yes, right breast, hospitalized 4/17-4/20/2023  LYMPHEDEMA ASSESSMENTS:   LANDMARK RIGHT  eval  10 cm proximal to olecranon process 24.8  Olecranon process 22.3  10 cm proximal to ulnar styloid process 18.0  Just proximal to ulnar styloid process 13.9  Across hand at thumb web space 17.8  At base of 2nd digit 5.65  (Blank rows = not  tested)  LANDMARK LEFT  eval  10 cm proximal to olecranon process 24.8  Olecranon process 22.4  10 cm proximal to ulnar styloid process 17.2  Just proximal to ulnar styloid process 13.9  Across hand at thumb web space 16.9  At base of 2nd digit 5.35  (Blank rows = not tested)   L-DEX LYMPHEDEMA SCREENING:  The patient was assessed using the L-Dex machine today to produce a lymphedema index baseline score. The patient will be reassessed on a regular basis (typically every 3 months) to obtain new L-Dex scores. If the score is > 6.5 points away from his/her baseline score indicating onset of subclinical lymphedema, it will be recommended to wear a compression garment for 4 weeks, 12 hours per day and then be reassessed. If the score continues to be > 6.5 points from baseline at reassessment, we will initiate lymphedema treatment. Assessing in this manner has a 95% rate of preventing clinically significant lymphedema.    QUICK DASH SURVEY: 9%   TODAY'S TREATMENT:                                                                                                                                         DATE: 09/05/2022 Pt educated in the 4 post op exercises to perform at home for  5 reps 2x/day for 5-10 seconds.  PATIENT EDUCATION:  Education details: 4 post op exercises Person educated: Patient Education method: Consulting civil engineer, Demonstration, and Handouts Education comprehension: verbalized understanding and returned demonstration  HOME EXERCISE PROGRAM: 4 post op exs  ASSESSMENT:  CLINICAL IMPRESSION: Patient is a 37 y.o. female who was seen today for physical therapy evaluation and treatment for reassessment and  limitations in shoulder ROM s/p bilateral mastectomies in addition to expander exchange surgery and later Right implant removal on 4/40/2023. She is also s/p radiation, and is on Eliquis due to a prior DVT from [place]. She presents with limitations in right shoulder ROM, most  notably flexion and abduction. She has not done any real strengthening with her arms and will benefit from instruction in that as well. She is also in need of SOZO screen.   OBJECTIVE IMPAIRMENTS: decreased activity tolerance, decreased knowledge of condition, decreased ROM, decreased strength, increased fascial restrictions, impaired UE functional use, and postural dysfunction.   ACTIVITY LIMITATIONS: reach over head and hygiene/grooming  PARTICIPATION LIMITATIONS:  activities requiring reaching  PERSONAL FACTORS: 3+ comorbidities: Right breast Cancer, Triple positive, s/p cellulitis with expander removal  , Radiation and  prior DVT on Eliquis are also affecting patient's functional outcome.   REHAB POTENTIAL: Good  CLINICAL DECISION MAKING: Stable/uncomplicated  EVALUATION COMPLEXITY: Low  GOALS: Goals reviewed with patient? Yes  STG=LONG TERM GOALS: Target date: 10/17/2022  Pt will be independent and compliant with HEP to improve Right shoulder ROM and strengthening Baseline:  Goal status: INITIAL  2.  Pt will improve shoulder flexion and abduction by atleast 15 degrees Baseline: 145, 110 Goal status: INITIAL  3.  Pt will have Right shoulder ROM to  WNL for improved reaching ability Baseline:  Goal status: INITIAL  4.  Pt will attend ABC class to be educated in exercises and lymphedema Baseline:  Goal status: INITIAL  5.  Pt will be able to reach into high cabinets without complaints. Baseline:  Goal status: INITIAL  PLAN:  PT FREQUENCY: 2x/week  PT DURATION: 6 weeks  PLANNED INTERVENTIONS: Therapeutic exercises, Therapeutic activity, Neuromuscular re-education, Patient/Family education, Self Care, Joint mobilization, Orthotic/Fit training, Dry Needling, scar mobilization, Manual therapy, and Re-evaluation  PLAN FOR NEXT SESSION: NO CUPPING: on EliquisSOZO next,progress to supine wand, pec stretch on wall, STM UT/pecs, PROM, progress to strength when ready    PROM, Claris Pong, PT 09/05/2022, 11:55 AM

## 2022-09-05 ENCOUNTER — Ambulatory Visit: Payer: 59 | Attending: Hematology and Oncology

## 2022-09-05 ENCOUNTER — Other Ambulatory Visit: Payer: Self-pay

## 2022-09-05 DIAGNOSIS — R293 Abnormal posture: Secondary | ICD-10-CM | POA: Diagnosis present

## 2022-09-05 DIAGNOSIS — C50411 Malignant neoplasm of upper-outer quadrant of right female breast: Secondary | ICD-10-CM | POA: Insufficient documentation

## 2022-09-05 DIAGNOSIS — Z170 Estrogen receptor positive status [ER+]: Secondary | ICD-10-CM | POA: Insufficient documentation

## 2022-09-05 DIAGNOSIS — M25611 Stiffness of right shoulder, not elsewhere classified: Secondary | ICD-10-CM | POA: Insufficient documentation

## 2022-09-08 NOTE — Progress Notes (Signed)
Patient Care Team: Dian Queen, MD as PCP - General (Obstetrics and Gynecology) Dian Queen, MD (Obstetrics and Gynecology) Mauro Kaufmann, RN as Oncology Nurse Navigator Rockwell Germany, RN as Oncology Nurse Navigator Nicholas Lose, MD as Consulting Physician (Hematology and Oncology) Gery Pray, MD as Consulting Physician (Radiation Oncology) Rolm Bookbinder, MD as Consulting Physician (General Surgery)  DIAGNOSIS: No diagnosis found.  SUMMARY OF ONCOLOGIC HISTORY: Oncology History  Malignant neoplasm of upper-outer quadrant of right breast in female, estrogen receptor positive ([place])  06/23/2021 Initial Diagnosis   Palpable lump in the right breast. Diagnostic mammogram and [place]: a 3.8 cm mass at the retroareolar right breast, 1 prominent right axillary lymph node with a 4 mm cortex, 1 other which is borderline measuring 3 mm. 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  3 o'clock position: Biopsy fibroadenoma   07/12/2021 -  Neo-Adjuvant Chemotherapy   Neoadjuvant chemotherapy with TCH Perjeta 6 cycles followed by Herceptin Perjeta maintenance versus Kadcyla maintenance (based on response to neoadjuvant chemo) for 1 year   07/28/2021 Genetic Testing   Negative hereditary cancer genetic testing: no pathogenic variants detected in [facility] CancerNext-Expanded +RNAinsight Panel.  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  The report date is July 28, 2021.   The CancerNext-Expanded gene panel offered by [facility] and includes sequencing, rearrangement, and RNA analysis for the following 77 genes: AIP, ALK, APC, ATM, AXIN2, BAP1, BARD1, BLM, BMPR1A, BRCA1, BRCA2, BRIP1, CDC73, CDH1, CDK4, CDKN1B, CDKN2A, CHEK2, CTNNA1, DICER1, FANCC, FH, FLCN, GALNT12, KIF1B, LZTR1, MAX, MEN1, MET, MLH1, MSH2, MSH3, MSH6, MUTYH, NBN, NF1, NF2, NTHL1, PALB2, PHOX2B, PMS2, POT1,  PRKAR1A, PTCH1, PTEN, RAD51C, RAD51D, RB1, RECQL, RET, SDHA, SDHAF2, SDHB, SDHC, SDHD, SMAD4, SMARCA4, SMARCB1, SMARCE1, STK11, SUFU, TMEM127, TP53, TSC1, TSC2, VHL and XRCC2 (sequencing and deletion/duplication); EGFR, EGLN1, HOXB13, KIT, MITF, PDGFRA, POLD1, and POLE (sequencing only); EPCAM and GREM1 (deletion/duplication only).    12/06/2021 Surgery   Bilateral Mastectomies: Left mastectomy: Benign Right mastectomy: Multifocal ILC 1.8 cm, 0.9 cm, margins negative, 1/2 lymph nodes positive, ER 50%, PR 30%, HER2 1+ IHC   12/26/2021 - 12/29/2021 Chemotherapy   Patient is on Treatment Plan : BREAST ADO-Trastuzumab Emtansine (Kadcyla) q21d       CHIEF COMPLIANT: Follow-up of right breast cancer on Kadcyla.     INTERVAL HISTORY: Jenna Cross is a 36 y.o with the above- mentioned right breast cancer. Currently on treatment with Kadcyla. She presents to the clinic today for treatment and a follow-up.      ALLERGIES:  is allergic to wound dressings.  MEDICATIONS:  Current Outpatient Medications  Medication Sig Dispense Refill   acetaminophen (TYLENOL) 500 MG tablet Take 1,000 mg by mouth every 6 (six) hours as needed for mild pain or headache.     ALPRAZolam (XANAX) 0.5 MG tablet Take 0.5 mg by mouth 3 (three) times daily as needed for anxiety.     clindamycin (CLINDAGEL) 1 % gel Apply topically 2 (two) times daily. Apply to affect area two times daily 30 g 1   diphenoxylate-atropine (LOMOTIL) 2.5-0.025 MG tablet Take 1 tablet by mouth 3 (three) times daily as needed for diarrhea or loose stools. 30 tablet 3   Goserelin Acetate (ZOLADEX [place])      hydrocortisone cream 0.5 % Apply 1 Application topically as needed for itching.     Investigational tucatanib/placebo 150 MG tablet (ONT-380,NSC #[id number]) [facility]IANCE [id number] Take 2 tablets (300 mg total) 2 (  two) times dailyfor 21 days.  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 Tucatinib or placebo should take orally twice daily with or without food, approximately 12  hours apart between doses. Tablets must be swallowed whole and may not be crushed, chewed or dissolved in liquid. CAUTION: Chemotherapy/Biotherapy 84 tablet 0   letrozole (FEMARA) 2.5 MG tablet Take 1 tablet (2.5 mg total) by mouth daily. 90 tablet 3   lidocaine-prilocaine (EMLA) cream lidocaine-prilocaine 2.5 %-2.5 % topical cream  APPLY TO AFFECTED AREA ONCE AS DIRECTED     loratadine (CLARITIN) 10 MG tablet Take 10 mg by mouth daily as needed for allergies.     LORazepam (ATIVAN) 0.5 MG tablet TAKE 1 TABLET (0.5 MG TOTAL) BY MOUTH AT BEDTIME AS NEEDED (NAUSEA OR VOMITING).     ondansetron (ZOFRAN) 8 MG tablet Take 1 tablet (8 mg total) by mouth every 8 (eight) hours as needed for nausea. 30 tablet 3   valACYclovir (VALTREX) 500 MG tablet Take 500 mg by mouth every evening.     venlafaxine XR (EFFEXOR-XR) 37.5 MG 24 hr capsule Take 1 capsule (37.5 mg total) by mouth daily with breakfast. 90 capsule 1   XARELTO 20 MG TABS tablet TAKE 1 TABLET BY MOUTH DAILY WITH SUPPER. 30 tablet 5   No current facility-administered medications for this visit.    PHYSICAL EXAMINATION: ECOG PERFORMANCE STATUS: {CHL ONC ECOG PS:[id number]}  There were no vitals filed for this visit. There were no vitals filed for this visit.  BREAST:*** No palpable masses or nodules in either right or left breasts. No palpable axillary supraclavicular or infraclavicular adenopathy no breast tenderness or nipple discharge. (exam performed in the presence of a chaperone)  LABORATORY DATA:  I have reviewed the data as listed    Latest Ref Rng & Units 08/23/2022    8:14 AM 08/02/2022    8:30 AM 07/12/2022    8:38 AM  CMP  Glucose 70 - 99 mg/dL 79  91  98   BUN 6 - 20 mg/dL _0 Creatinine 0.44 - 1.00 mg/dL 0.99  0.97  0.96   Sodium 135 - 145 mmol/L 140  140  139   Potassium 3.5 - 5.1 mmol/L 3.5  3.7  3.9   Chloride 98 - 111 mmol/L 109  109  106   CO2 22 - 32 mmol/L _1 Calcium 8.9 - 10.3 mg/dL 10.0  9.0   8.9   Total Protein 6.5 - 8.1 g/dL 8.3  6.9  7.2   Total Bilirubin 0.3 - 1.2 mg/dL 0.8  0.6  0.5   Alkaline Phos 38 - 126 U/L 107  110  100   AST 15 - 41 U/L 37  26  23   ALT 0 - 44 U/L _2 Lab Results  Component Value Date   WBC 7.1 08/23/2022   HGB 15.2 (H) 08/23/2022   HCT 44.0 08/23/2022   MCV 90.5 08/23/2022   PLT 252 08/23/2022   NEUTROABS 4.3 08/23/2022    ASSESSMENT & PLAN:  No problem-specific Assessment & Plan notes found for this encounter.    No orders of the defined types were placed in this encounter.  The patient has a good understanding of the overall plan. she agrees with it. she will call with any problems that may develop before the next visit here. Total time spent: 30 mins including face to face time and time spent for  planning, charting and co-ordination of care   Suzzette Righter, [place] 09/08/22    I Gardiner Coins am scribing for Dr. Lindi Adie  ***

## 2022-09-13 ENCOUNTER — Inpatient Hospital Stay: Payer: 59 | Attending: Hematology and Oncology

## 2022-09-13 ENCOUNTER — Inpatient Hospital Stay (HOSPITAL_BASED_OUTPATIENT_CLINIC_OR_DEPARTMENT_OTHER): Payer: 59 | Admitting: Hematology and Oncology

## 2022-09-13 ENCOUNTER — Other Ambulatory Visit: Payer: Self-pay

## 2022-09-13 ENCOUNTER — Inpatient Hospital Stay: Payer: 59

## 2022-09-13 VITALS — BP 109/74 | HR 103 | Temp 97.8°F | Resp 18 | Ht 64.0 in | Wt 124.0 lb

## 2022-09-13 VITALS — HR 94

## 2022-09-13 DIAGNOSIS — Z170 Estrogen receptor positive status [ER+]: Secondary | ICD-10-CM

## 2022-09-13 DIAGNOSIS — C50411 Malignant neoplasm of upper-outer quadrant of right female breast: Secondary | ICD-10-CM | POA: Insufficient documentation

## 2022-09-13 DIAGNOSIS — Z006 Encounter for examination for normal comparison and control in clinical research program: Secondary | ICD-10-CM | POA: Diagnosis not present

## 2022-09-13 DIAGNOSIS — Z95828 Presence of other vascular implants and grafts: Secondary | ICD-10-CM

## 2022-09-13 DIAGNOSIS — Z79899 Other long term (current) drug therapy: Secondary | ICD-10-CM | POA: Diagnosis not present

## 2022-09-13 DIAGNOSIS — Z5111 Encounter for antineoplastic chemotherapy: Secondary | ICD-10-CM | POA: Diagnosis present

## 2022-09-13 DIAGNOSIS — Z0000 Encounter for general adult medical examination without abnormal findings: Secondary | ICD-10-CM

## 2022-09-13 LAB — CMP (CANCER CENTER ONLY)
ALT: 21 U/L (ref 0–44)
AST: 28 U/L (ref 15–41)
Albumin: 3.9 g/dL (ref 3.5–5.0)
Alkaline Phosphatase: 110 U/L (ref 38–126)
Anion gap: 6 (ref 5–15)
BUN: 9 mg/dL (ref 6–20)
CO2: 29 mmol/L (ref 22–32)
Calcium: 9.7 mg/dL (ref 8.9–10.3)
Chloride: 106 mmol/L (ref 98–111)
Creatinine: 0.81 mg/dL (ref 0.44–1.00)
GFR, Estimated: >60 mL/min (ref >60)
Glucose, Bld: 97 mg/dL (ref 70–99)
Potassium: 3.9 mmol/L (ref 3.5–5.1)
Sodium: 141 mmol/L (ref 135–145)
Total Bilirubin: 0.6 mg/dL (ref 0.3–1.2)
Total Protein: 7.3 g/dL (ref 6.5–8.1)

## 2022-09-13 LAB — CBC WITH DIFFERENTIAL (CANCER CENTER ONLY)
Abs Immature Granulocytes: 0.02 10*3/uL (ref 0.00–0.07)
Basophils Absolute: 0.1 10*3/uL (ref 0.0–0.1)
Basophils Relative: 1 %
Eosinophils Absolute: 0.4 10*3/uL (ref 0.0–0.5)
Eosinophils Relative: 8 %
HCT: 38.5 % (ref 36.0–46.0)
Hemoglobin: 13.1 g/dL (ref 12.0–15.0)
Immature Granulocytes: 0 %
Lymphocytes Relative: 24 %
Lymphs Abs: 1.3 10*3/uL (ref 0.7–4.0)
MCH: 32 pg (ref 26.0–34.0)
MCHC: 34 g/dL (ref 30.0–36.0)
MCV: 93.9 fL (ref 80.0–100.0)
Monocytes Absolute: 0.4 10*3/uL (ref 0.1–1.0)
Monocytes Relative: 8 %
Neutro Abs: 3.1 10*3/uL (ref 1.7–7.7)
Neutrophils Relative %: 59 %
Platelet Count: 338 10*3/uL (ref 150–400)
RBC: 4.1 MIL/uL (ref 3.87–5.11)
RDW: 14.6 % (ref 11.5–15.5)
WBC Count: 5.2 10*3/uL (ref 4.0–10.5)
nRBC: 0 % (ref 0.0–0.2)

## 2022-09-13 LAB — PREGNANCY, URINE: Preg Test, Ur: NEGATIVE

## 2022-09-13 MED ORDER — INV-TUCATINIB/PLACEBO 150 MG TABLET ALLIANCE A011801: 300.0000 mg | ORAL_TABLET | Freq: Two times a day (BID) | ORAL | 0 refills | Status: AC

## 2022-09-13 MED ORDER — SODIUM CHLORIDE 0.9% FLUSH
10.0000 mL | Freq: Once | INTRAVENOUS | Status: AC
  Administered 2022-09-13: 10 mL

## 2022-09-13 MED ORDER — SODIUM CHLORIDE 0.9 % IV SOLN
3.6000 mg/kg | Freq: Once | INTRAVENOUS | Status: AC
  Administered 2022-09-13: 220 mg via INTRAVENOUS
  Filled 2022-09-13: qty 8

## 2022-09-13 MED ORDER — SODIUM CHLORIDE 0.9 % IV SOLN: Freq: Once | INTRAVENOUS | Status: AC

## 2022-09-13 MED ORDER — HEPARIN SOD (PORK) LOCK FLUSH 100 UNIT/ML IV SOLN
500.0000 [IU] | Freq: Once | INTRAVENOUS | Status: AC | PRN
  Administered 2022-09-13: 500 [IU]

## 2022-09-13 MED ORDER — GOSERELIN ACETATE 3.6 MG ~~LOC~~ IMPL
3.6000 mg | DRUG_IMPLANT | Freq: Once | SUBCUTANEOUS | Status: AC
  Administered 2022-09-13: 3.6 mg via SUBCUTANEOUS
  Filled 2022-09-13: qty 3.6

## 2022-09-13 MED ORDER — ACETAMINOPHEN 325 MG PO TABS
650.0000 mg | ORAL_TABLET | Freq: Once | ORAL | Status: AC
  Administered 2022-09-13: 650 mg via ORAL
  Filled 2022-09-13: qty 2

## 2022-09-13 MED ORDER — ONDANSETRON HCL 4 MG/2ML IJ SOLN
8.0000 mg | Freq: Once | INTRAMUSCULAR | Status: AC
  Administered 2022-09-13: 8 mg via INTRAVENOUS
  Filled 2022-09-13: qty 4

## 2022-09-13 MED ORDER — CYANOCOBALAMIN 1000 MCG/ML IJ SOLN
1000.0000 ug | Freq: Once | INTRAMUSCULAR | Status: AC
  Administered 2022-09-13: 1000 ug via INTRAMUSCULAR
  Filled 2022-09-13: qty 1

## 2022-09-13 MED ORDER — SODIUM CHLORIDE 0.9% FLUSH
10.0000 mL | INTRAVENOUS | Status: DC | PRN
  Administered 2022-09-13: 10 mL

## 2022-09-13 MED ORDER — DIPHENHYDRAMINE HCL 25 MG PO CAPS
50.0000 mg | ORAL_CAPSULE | Freq: Once | ORAL | Status: AC
  Administered 2022-09-13: 50 mg via ORAL
  Filled 2022-09-13: qty 2

## 2022-09-13 NOTE — Patient Instructions (Signed)
[facility] MEDICAL ONCOLOGY   Discharge Instructions: Thank you for choosing [facility] to provide your oncology and hematology care.   If you have a lab appointment with the Cancer Center, please go directly to the Cancer Center and check in at the registration area.   Wear comfortable clothing and clothing appropriate for easy access to any Portacath or PICC line.   We strive to give you quality time with your provider. You may need to reschedule your appointment if you arrive late (15 or more minutes).  Arriving late affects you and other patients whose appointments are after yours.  Also, if you miss three or more appointments without notifying the office, you may be dismissed from the clinic at the provider's discretion.      For prescription refill requests, have your pharmacy contact our office and allow 72 hours for refills to be completed.    Today you received the following chemotherapy and/or immunotherapy agents: ado-trastuzumab emtansine      To help prevent nausea and vomiting after your treatment, we encourage you to take your nausea medication as directed.  BELOW ARE SYMPTOMS THAT SHOULD BE REPORTED IMMEDIATELY: *FEVER GREATER THAN 100.4 F (38 C) OR HIGHER *CHILLS OR SWEATING *NAUSEA AND VOMITING THAT IS NOT CONTROLLED WITH YOUR NAUSEA MEDICATION *UNUSUAL SHORTNESS OF BREATH *UNUSUAL BRUISING OR BLEEDING *URINARY PROBLEMS (pain or burning when urinating, or frequent urination) *BOWEL PROBLEMS (unusual diarrhea, constipation, pain near the anus) TENDERNESS IN MOUTH AND THROAT WITH OR WITHOUT PRESENCE OF ULCERS (sore throat, sores in mouth, or a toothache) UNUSUAL RASH, SWELLING OR PAIN  UNUSUAL VAGINAL DISCHARGE OR ITCHING   Items with * indicate a potential emergency and should be followed up as soon as possible or go to the Emergency Department if any problems should occur.  Please show the CHEMOTHERAPY ALERT CARD or IMMUNOTHERAPY ALERT  CARD at check-in to the Emergency Department and triage nurse.  Should you have questions after your visit or need to cancel or reschedule your appointment, please contact  CANCER CENTER MEDICAL ONCOLOGY  Dept: [phone]  and follow the prompts.  Office hours are 8:00 a.m. to 4:30 p.m. Monday - Friday. Please note that voicemails left after 4:00 p.m. may not be returned until the following business day.  We are closed weekends and major holidays. You have access to a nurse at all times for urgent questions. Please call the main number to the clinic Dept: [phone] and follow the prompts.   For any non-urgent questions, you may also contact your provider using MyChart. We now offer e-Visits for anyone 18 and older to request care online for non-urgent symptoms. For details visit [email or web address].   Also download the MyChart app! Go to the app store, search "MyChart", open the app, select , and log in with your MyChart username and password.  Masks are optional in the cancer centers. If you would like for your care team to wear a mask while they are taking care of you, please let them know. You may have one support Azzan Butler who is at least 37 years old accompany you for your appointments. 

## 2022-09-13 NOTE — Assessment & Plan Note (Addendum)
Palpable lump in the right breast. Diagnostic mammogram and [place]: a 3.8 cm mass at the retroareolar right breast, 1 prominent right axillary lymph node with a 4 mm cortex, 1 other which is borderline measuring 3 mm. 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  3 o'clock position: Biopsy fibroadenoma   Treatment plan: 1. Neoadjuvant chemotherapy with TCH Perjeta 6 cycles followed by Herceptin Perjeta maintenance versus Kadcyla maintenance (based on response to neoadjuvant chemo) for 1 year 2. Bilateral Mastectomies: Left mastectomy: Benign Right mastectomy: Multifocal ILC 1.8 cm, 0.9 cm, margins negative, 1/2 lymph nodes positive, ER 50%, PR 30%, HER2 1+ IHC 3.  [facility] HER2 clinical trial: Randomization between Kadcyla versus Kadcyla plus Tucatinib 4. Followed by adjuvant radiation therapy  5.  Followed by antiestrogen therapy   ------------------------------------------------------------------------------------------------------------------------ Current treatment: Kadcyla cycle 10 maintenance ([facility] HER2 clinical trial) + Tucatinib vs Placebo Echocardiogram March 2023: EF 55 to 60%   Hospitalization 01/22/2022-01/26/2022 abscess/cellulitis required removal of the implant The plan is now a delayed reconstruction   Adverse Effects: Tolerating it well 1. Mild diarrhea: Same as before 2. mild nausea couple days after treatment 3.  Fatigue: Makes her take a nap. 4.  Nailbed irritation: Due to manicure   Worsening fatigue: On monthly B12 injections   Ultrasound bilateral breasts 08/16/2022: Interval decrease in size of the left breast fat necrosis.  At the site of palpable concern there is no sonographic abnormality. She has seen Dr. Veda Canning plastic surgery who plans to do a latissimus dorsi flap reconstruction after Kadcyla is completed.   Return to clinic in 3 weeks for next cycle of Kadcyla. Her port can be removed  after Steward Drone is completed in February

## 2022-09-13 NOTE — Research (Signed)
CompassHER2 Residual Disease, A Double-Blinded, Phase III Randomized Trial of T-DM1 and Placebo Compared with T-DMI and Tucatinib  Patient presented to clinic unaccompanied this morning for cycle 11 day 1.  ECHO: Completed on 09/03/22.  LABS: CBC and CMP collected via port-a-cath per consent and study protocol. Patient tolerated well without complaint. Urine pregnancy collected per institutional guidelines.   VITAL SIGNS: Completed, including weight. See vital signs flowsheet. Patient initially had tachycardia (HR 103) but was within normal limits (HR 94) on recheck.  H&P: Completed by Dr Jenna Cross. See clinic note dated 09/13/22. Provider reviewed lab results and cleared patient to continue treatment.   RESEARCH: Concomitant medications reviewed. Patient noted clindamycin 1% gel that started on 08/28/22. Medication list updated and verified with patient.  Solicited and other adverse events assessed per protocol. Patient reported that she again had mild anorexia (grade 1) for three days following her T-DM1 infusion. Patient also noted several new AEs. She reported a mild nail (cuticle) infection (grade 1) after receiving a manicure; she contacted the clinic the day she noticed it and Dr Jenna Cross prescribed clindamycin 1% gel. Symptoms are ongoing but improving. She also reported a "cold" that caused mild nasal congestion (grade 1) and sinus pain (grade 1) that she managed with OTC saline spray. This lasted about a week over the Thanksgiving holiday. Per Dr Jenna Cross, the cuticle infection and cold symptoms are unrelated to study treatment. She noted that some of her ongoing AEs improved this cycle, while several remained bothersome; she reported that her symptoms from her ongoing GI bug -- nausea (grade 1) and diarrhea (grade 1) -- resolved one week after last clinic visit. Patient noted ongoing fatigue (grade 1 -- baseline), mild muscle weakness (grade 1) in her arms as well as ongoing moderate disturbed  sleep/insomnia (grade 2). Patient's breast pain (grade 1) and bruising (grade 1) were also unchanged and ongoing. See table below  Patient returned remaining cycle 10 tucatinib/placebo and medication diary. She returned two bottles, one bottle with six tablets remaining and one unopened bottle of 50 tablets (6 + 50 = 56; 100 total tablets dispensed - 56 remaining = 44 tablets taken). Medication diary reflects the same: 44 tablets taken; 22 total doses. Remaining tucatinib/placebo returned to pharmacy.  Cycle 11 tucatinib/placebo dispensed by pharmacy. Research RN verified that drug had correct patient name and study ID number. Drug was dispensed in two bottles of 50 '150mg'$  tablets, lot #[id number] (bottle 1: [id number], bottle 2: [id number]), exp 05/2023. Patient instructed to continue taking two tablets ('300mg'$  total) orally twice daily and to continue recording doses and side effects on medication diary. Reinforced importance of compliance with study medication; patient reports she had turned off the alarms on her phone but will turn them back on, as they have previously been helpful. Patient verbalized understanding. Patient was given cycle 11 medication diary.   INFUSION: Patient received clinic standard pre-medications for T-DM1 treatment -- '650mg'$  acetaminophen PO and '50mg'$  diphenhydramine PO. PKs not required this cycle per protocol. Patient tolerated infusion well without complaint. Patient took morning dose of tucatinib/placebo at 1000 from cycle 11 supply.   ADVERSE EVENT LOG: Study/Protocol: [facility] [id number] CompassHER2 Cycle 11  Baseline: anxiety (grade 2), fatigue (grade 1), herpes simplex virus, hypotension (grade 1), vascular access complication (grade 3). Not reportable (unrelated/unlikely to be related to study treatment and < grade 3): breast pain (grade 1), bruising (grade 1), muscle weakness -- upper limb (grade 1), nail infection (grade 1), nasal congestion (grade 1), sinus pain (grade  1).  Event  (solicited vs other reportable) Grade CTCAE v5.0 Onset Date Resolved Date Attribution to Study Treatment Treatment Comments  Neutrophil count decreased 0 N/A N/A N/A N/A   Platelet count decreased 0 N/A N/A N/A N/A   Rash (maculo-papular) 0 N/A N/A N/A N/A   Epistaxis 0 N/A N/A N/A N/A    Diarrhea 1 08/16/22 08/30/22 Unrelated N/A Patient reported a baseline of 2-3 stools/day. Possible GI bug; symptoms resolved 1 week after last clinic visit.  Nausea 1 08/16/22 08/30/22 Unrelated Ondansetron Possible GI bug; symptoms resolved 1 week after last clinic visit.  Vomiting 0 N/A N/A N/A N/A   Bilirubin increased 0 N/A N/A N/A N/A    AST increased 0 N/A N/A N/A N/A    ALT increased 0 N/A N/A N/A N/A    Anemia 0 N/A N/A N/A N/A    Mucositis oral 0 N/A N/A N/A N/A    Palmar-plantar erythrodysesthesia 0 N/A N/A N/A N/A    Peripheral neuropathy -- motor 0 N/A N/A N/A N/A    Peripheral neuropathy -- sensory 0 N/A N/A N/A N/A   Anorexia 1 08/24/22 08/26/22 Probable N/A Patient reported loss of appetite x3 days after T-DM1 infusion.  Insomnia 2 06/22/22 Ongoing Probable Adjustments to medication schedule Patient reported moderate disturbed sleep.   PLAN: Next treatment visit is 10/04/22. Visit will include labs, a provider visit, and an infusion appointment. Patient reminded to bring remaining tucatinib/placebo tablets and cycle 11 medication diary to C12D1 appointment. Patient verbalized understanding. Research will meet with patient and accompany her to appointments on C12D1. Patient reminded to continue to fill out ePROs as they become available. Patient thanked for her time and continued voluntary participation in this study. Patient has been provided direct contact information and is encouraged to contact this nurse for any questions or concerns.   Jenna Penna, RN, BSN, CPN Clinical Research Nurse I [phone]  09/13/2022 10:45 AM

## 2022-09-14 ENCOUNTER — Telehealth: Payer: Self-pay | Admitting: Hematology and Oncology

## 2022-09-14 NOTE — Telephone Encounter (Signed)
Rescheduled appointment per provider PAL. Patient is aware of the changes made to her upcoming appointments. 

## 2022-09-15 ENCOUNTER — Ambulatory Visit: Payer: 59

## 2022-09-18 ENCOUNTER — Other Ambulatory Visit: Payer: Self-pay | Admitting: Hematology and Oncology

## 2022-09-18 ENCOUNTER — Telehealth: Payer: Self-pay | Admitting: Hematology and Oncology

## 2022-09-18 NOTE — Telephone Encounter (Signed)
Scheduled appointment per WQ. Left voicemail. 

## 2022-09-24 ENCOUNTER — Telehealth: Payer: Self-pay

## 2022-09-24 ENCOUNTER — Ambulatory Visit: Payer: 59

## 2022-09-24 NOTE — Telephone Encounter (Signed)
CompassHER2 Residual Disease, A Double-Blinded, Phase III Randomized Trial of T-DM1 and Placebo Compared with T-DMI and Tucatinib  Spoke with patient via phone this morning to discuss adjustments to future cycles for above-listed research study. Patient's T61W4 will be delayed from 10/04/22 to 10/09/22 to accommodate for provider availability and the Christmas and [place] holidays; allowed per protocol (typical window is +/- 3 days, up to 7 day delay acceptable for major holidays). Future cycles adjusted based on new C12D1 date. Patient agreeable to new schedule.  Inquired if patient would have enough tucatinib/placebo to accommodate C12D1 delay; patient stated that she and her family had gone out of town for several days following C11D1 and she had forgotten her study drug, so she would have enough to cover additional days before returning to clinic for C12D1. Reminded patient to be compliant with study drug for the remainder of the cycle and to take her morning dose on C12D1, as her appointments are in the afternoon. Patient verbalized understanding and stated that she was "100% committed" to being compliant with her medication.  Patient thanked for her time and continued voluntary participation in this study. Patient has been provided direct contact information and is encouraged to contact this nurse for any questions or concerns.    Vickii Penna, RN, BSN, CPN Clinical Research Nurse I [phone]  09/24/2022 10:38 AM

## 2022-09-27 ENCOUNTER — Ambulatory Visit: Payer: 59

## 2022-10-04 ENCOUNTER — Ambulatory Visit: Payer: 59 | Admitting: Hematology and Oncology

## 2022-10-04 ENCOUNTER — Other Ambulatory Visit: Payer: 59

## 2022-10-04 ENCOUNTER — Ambulatory Visit: Payer: 59

## 2022-10-04 ENCOUNTER — Encounter: Payer: Self-pay | Admitting: *Deleted

## 2022-10-04 NOTE — Progress Notes (Signed)
Patient Care Team: Dian Queen, MD as PCP - General (Obstetrics and Gynecology) Dian Queen, MD (Obstetrics and Gynecology) Mauro Kaufmann, RN as Oncology Nurse Navigator Carlynn Spry, Charlott Holler, RN as Oncology Nurse Navigator Nicholas Lose, MD as Consulting Physician (Hematology and Oncology) Gery Pray, MD as Consulting Physician (Radiation Oncology) Rolm Bookbinder, MD as Consulting Physician (General Surgery)  DIAGNOSIS:  Encounter Diagnosis  Name Primary?   Malignant neoplasm of upper-outer quadrant of right breast in female, estrogen receptor positive ([place]) Yes    SUMMARY OF ONCOLOGIC HISTORY: Oncology History  Malignant neoplasm of upper-outer quadrant of right breast in female, estrogen receptor positive ([place])  06/23/2021 Initial Diagnosis   Palpable lump in the right breast. Diagnostic mammogram and [place]: a 3.8 cm mass at the retroareolar right breast, 1 prominent right axillary lymph node with a 4 mm cortex, 1 other which is borderline measuring 3 mm. 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  3 o'clock position: Biopsy fibroadenoma   07/12/2021 -  Neo-Adjuvant Chemotherapy   Neoadjuvant chemotherapy with TCH Perjeta 6 cycles followed by Herceptin Perjeta maintenance versus Kadcyla maintenance (based on response to neoadjuvant chemo) for 1 year   07/28/2021 Genetic Testing   Negative hereditary cancer genetic testing: no pathogenic variants detected in [facility] CancerNext-Expanded +RNAinsight Panel.  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  The report date is July 28, 2021.   The CancerNext-Expanded gene panel offered by [facility] and includes sequencing, rearrangement, and RNA analysis for the following 77 genes: AIP, ALK, APC, ATM, AXIN2, BAP1, BARD1, BLM, BMPR1A, BRCA1, BRCA2, BRIP1, CDC73, CDH1, CDK4, CDKN1B, CDKN2A, CHEK2, CTNNA1, DICER1, FANCC, FH,  FLCN, GALNT12, KIF1B, LZTR1, MAX, MEN1, MET, MLH1, MSH2, MSH3, MSH6, MUTYH, NBN, NF1, NF2, NTHL1, PALB2, PHOX2B, PMS2, POT1, PRKAR1A, PTCH1, PTEN, RAD51C, RAD51D, RB1, RECQL, RET, SDHA, SDHAF2, SDHB, SDHC, SDHD, SMAD4, SMARCA4, SMARCB1, SMARCE1, STK11, SUFU, TMEM127, TP53, TSC1, TSC2, VHL and XRCC2 (sequencing and deletion/duplication); EGFR, EGLN1, HOXB13, KIT, MITF, PDGFRA, POLD1, and POLE (sequencing only); EPCAM and GREM1 (deletion/duplication only).    12/06/2021 Surgery   Bilateral Mastectomies: Left mastectomy: Benign Right mastectomy: Multifocal ILC 1.8 cm, 0.9 cm, margins negative, 1/2 lymph nodes positive, ER 50%, PR 30%, HER2 1+ IHC   12/26/2021 - 12/29/2021 Chemotherapy   Patient is on Treatment Plan : BREAST ADO-Trastuzumab Emtansine (Kadcyla) q21d       CHIEF COMPLIANT: Follow-up of right breast cancer on Kadcyla.    INTERVAL HISTORY: Jenna Cross is a 36 y.o with the above- mentioned right breast cancer. Currently on treatment with Kadcyla. She presents to the clinic today for treatment and a follow-up. She states that she feels good. She says her nail bed is itchy. She reports a new rash on both hands and wrist, that was red and itchy. Some on face that was swollen broke out like hives. She said she took benadryl and it subsided. More around the left eye. She also reports constipation but denies diarrhea.   ALLERGIES:  is allergic to wound dressings.  MEDICATIONS:  Current Outpatient Medications  Medication Sig Dispense Refill   acetaminophen (TYLENOL) 500 MG tablet Take 1,000 mg by mouth every 6 (six) hours as needed for mild pain or headache.     ALPRAZolam (XANAX) 0.5 MG tablet Take 0.5 mg by mouth 3 (three) times daily as needed for anxiety.     clindamycin (CLINDAGEL) 1 % gel Apply topically 2 (two) times daily. Apply to affect  area two times daily 30 g 1   diphenoxylate-atropine (LOMOTIL) 2.5-0.025 MG tablet Take 1 tablet by mouth 3 (three) times daily as needed for  diarrhea or loose stools. 30 tablet 3   Goserelin Acetate (ZOLADEX [place])      hydrocortisone cream 0.5 % Apply 1 Application topically as needed for itching.     letrozole (FEMARA) 2.5 MG tablet Take 1 tablet (2.5 mg total) by mouth daily. 90 tablet 3   lidocaine-prilocaine (EMLA) cream lidocaine-prilocaine 2.5 %-2.5 % topical cream  APPLY TO AFFECTED AREA ONCE AS DIRECTED     loratadine (CLARITIN) 10 MG tablet Take 10 mg by mouth daily as needed for allergies.     LORazepam (ATIVAN) 0.5 MG tablet TAKE 1 TABLET (0.5 MG TOTAL) BY MOUTH AT BEDTIME AS NEEDED (NAUSEA OR VOMITING).     ondansetron (ZOFRAN) 8 MG tablet Take 1 tablet (8 mg total) by mouth every 8 (eight) hours as needed for nausea. 30 tablet 3   valACYclovir (VALTREX) 500 MG tablet Take 500 mg by mouth every evening.     venlafaxine XR (EFFEXOR-XR) 37.5 MG 24 hr capsule Take 1 capsule (37.5 mg total) by mouth daily with breakfast. 90 capsule 1   XARELTO 20 MG TABS tablet TAKE 1 TABLET BY MOUTH DAILY WITH SUPPER. 30 tablet 5   No current facility-administered medications for this visit.    PHYSICAL EXAMINATION: ECOG PERFORMANCE STATUS: 1 - Symptomatic but completely ambulatory  Vitals:   10/09/22 1400  BP: 119/76  Pulse: 98  Temp: 98.2 F (36.8 C)  SpO2: 97%   Filed Weights   10/09/22 1400  Weight: 126 lb (57.2 kg)      LABORATORY DATA:  I have reviewed the data as listed    Latest Ref Rng & Units 10/09/2022    1:34 PM 09/13/2022    9:01 AM 08/23/2022    8:14 AM  CMP  Glucose 70 - 99 mg/dL 87  97  79   BUN 6 - 20 mg/dL _0 Creatinine 0.44 - 1.00 mg/dL 0.79  0.81  0.99   Sodium 135 - 145 mmol/L 140  141  140   Potassium 3.5 - 5.1 mmol/L 3.4  3.9  3.5   Chloride 98 - 111 mmol/L 104  106  109   CO2 22 - 32 mmol/L _1 Calcium 8.9 - 10.3 mg/dL 9.6  9.7  10.0   Total Protein 6.5 - 8.1 g/dL 7.4  7.3  8.3   Total Bilirubin 0.3 - 1.2 mg/dL 0.9  0.6  0.8   Alkaline Phos 38 - 126 U/L 102  110  107   AST 15  - 41 U/L 27  28  37   ALT 0 - 44 U/L _2 Lab Results  Component Value Date   WBC 6.5 10/09/2022   HGB 13.1 10/09/2022   HCT 37.0 10/09/2022   MCV 92.0 10/09/2022   PLT 197 10/09/2022   NEUTROABS 4.3 10/09/2022    ASSESSMENT & PLAN:  Malignant neoplasm of upper-outer quadrant of right breast in female, estrogen receptor positive ([facility]) Palpable lump in the right breast. Diagnostic mammogram and [place]: a 3.8 cm mass at the retroareolar right breast, 1 prominent right axillary lymph node with a 4 mm cortex, 1 other which is borderline measuring 3 mm. 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  3  o'clock position: Biopsy fibroadenoma   Treatment plan: 1. Neoadjuvant chemotherapy with TCH Perjeta 6 cycles followed by Herceptin Perjeta maintenance versus Kadcyla maintenance (based on response to neoadjuvant chemo) for 1 year 2. Bilateral Mastectomies: Left mastectomy: Benign Right mastectomy: Multifocal ILC 1.8 cm, 0.9 cm, margins negative, 1/2 lymph nodes positive, ER 50%, PR 30%, HER2 1+ IHC 3.  Compass HER2 clinical trial: Randomization between Kadcyla versus Kadcyla plus Tucatinib 4. Followed by adjuvant radiation therapy  5.  Followed by antiestrogen therapy   ------------------------------------------------------------------------------------------------------------------------ Current treatment: Kadcyla cycle 10 maintenance (Compass HER2 clinical trial) + Tucatinib vs Placebo Echocardiogram March 2023: EF 55 to 60%   Hospitalization 01/22/2022-01/26/2022 abscess/cellulitis required removal of the implant The plan is now a delayed reconstruction   Adverse Effects: Tolerating it well 1. Mild diarrhea: No replace with constipation 2. mild nausea: Resolved 3.  Fatigue: No longer an issue 4.  Nailbed irritation: Probably related to the study drug.  It is grade 1.  She is applying topical cortisone.   This appears to be helping.  If it gets worse then we will consider treating her with either an antifungal or antiinflammatory medications.   Worsening fatigue: On monthly B12 injections  She has seen Dr. Veda Canning plastic surgery who plans to do a latissimus dorsi flap reconstruction after Kadcyla is completed.   Return to clinic in 3 weeks for next cycle of Kadcyla. Her port can be removed after [facility] Drone is completed in February    No orders of the defined types were placed in this encounter.  The patient has a good understanding of the overall plan. she agrees with it. she will call with any problems that may develop before the next visit here. Total time spent: 30 mins including face to face time and time spent for planning, charting and co-ordination of care   Harriette Ohara, MD 10/09/22   I Gardiner Coins am acting as a Education administrator for [employer]  I have reviewed the above documentation for accuracy and completeness, and I agree with the above.

## 2022-10-06 ENCOUNTER — Ambulatory Visit: Payer: 59

## 2022-10-09 ENCOUNTER — Other Ambulatory Visit: Payer: Self-pay

## 2022-10-09 ENCOUNTER — Ambulatory Visit: Payer: 59

## 2022-10-09 ENCOUNTER — Inpatient Hospital Stay: Payer: 59 | Admitting: Hematology and Oncology

## 2022-10-09 ENCOUNTER — Inpatient Hospital Stay: Payer: 59 | Attending: Hematology and Oncology

## 2022-10-09 ENCOUNTER — Inpatient Hospital Stay: Payer: 59

## 2022-10-09 VITALS — BP 119/76 | HR 98 | Temp 98.2°F | Wt 126.0 lb

## 2022-10-09 DIAGNOSIS — Z170 Estrogen receptor positive status [ER+]: Secondary | ICD-10-CM | POA: Insufficient documentation

## 2022-10-09 DIAGNOSIS — Z5111 Encounter for antineoplastic chemotherapy: Secondary | ICD-10-CM | POA: Insufficient documentation

## 2022-10-09 DIAGNOSIS — Z79899 Other long term (current) drug therapy: Secondary | ICD-10-CM

## 2022-10-09 DIAGNOSIS — R5383 Other fatigue: Secondary | ICD-10-CM

## 2022-10-09 DIAGNOSIS — Z9221 Personal history of antineoplastic chemotherapy: Secondary | ICD-10-CM | POA: Insufficient documentation

## 2022-10-09 DIAGNOSIS — Z95828 Presence of other vascular implants and grafts: Secondary | ICD-10-CM

## 2022-10-09 DIAGNOSIS — C50411 Malignant neoplasm of upper-outer quadrant of right female breast: Secondary | ICD-10-CM | POA: Insufficient documentation

## 2022-10-09 DIAGNOSIS — Z9013 Acquired absence of bilateral breasts and nipples: Secondary | ICD-10-CM

## 2022-10-09 DIAGNOSIS — Z006 Encounter for examination for normal comparison and control in clinical research program: Secondary | ICD-10-CM

## 2022-10-09 LAB — CBC WITH DIFFERENTIAL (CANCER CENTER ONLY)
Abs Immature Granulocytes: 0.02 10*3/uL (ref 0.00–0.07)
Basophils Absolute: 0.1 10*3/uL (ref 0.0–0.1)
Basophils Relative: 1 %
Eosinophils Absolute: 0.2 10*3/uL (ref 0.0–0.5)
Eosinophils Relative: 3 %
HCT: 37 % (ref 36.0–46.0)
Hemoglobin: 13.1 g/dL (ref 12.0–15.0)
Immature Granulocytes: 0 %
Lymphocytes Relative: 25 %
Lymphs Abs: 1.6 10*3/uL (ref 0.7–4.0)
MCH: 32.6 pg (ref 26.0–34.0)
MCHC: 35.4 g/dL (ref 30.0–36.0)
MCV: 92 fL (ref 80.0–100.0)
Monocytes Absolute: 0.4 10*3/uL (ref 0.1–1.0)
Monocytes Relative: 6 %
Neutro Abs: 4.3 10*3/uL (ref 1.7–7.7)
Neutrophils Relative %: 65 %
Platelet Count: 197 10*3/uL (ref 150–400)
RBC: 4.02 MIL/uL (ref 3.87–5.11)
RDW: 14.6 % (ref 11.5–15.5)
WBC Count: 6.5 10*3/uL (ref 4.0–10.5)
nRBC: 0 % (ref 0.0–0.2)

## 2022-10-09 LAB — PREGNANCY, URINE: Preg Test, Ur: NEGATIVE

## 2022-10-09 LAB — CMP (CANCER CENTER ONLY)
ALT: 21 U/L (ref 0–44)
AST: 27 U/L (ref 15–41)
Albumin: 4.2 g/dL (ref 3.5–5.0)
Alkaline Phosphatase: 102 U/L (ref 38–126)
Anion gap: 7 (ref 5–15)
BUN: 10 mg/dL (ref 6–20)
CO2: 29 mmol/L (ref 22–32)
Calcium: 9.6 mg/dL (ref 8.9–10.3)
Chloride: 104 mmol/L (ref 98–111)
Creatinine: 0.79 mg/dL (ref 0.44–1.00)
GFR, Estimated: >60 mL/min (ref >60)
Glucose, Bld: 87 mg/dL (ref 70–99)
Potassium: 3.4 mmol/L — ABNORMAL LOW (ref 3.5–5.1)
Sodium: 140 mmol/L (ref 135–145)
Total Bilirubin: 0.9 mg/dL (ref 0.3–1.2)
Total Protein: 7.4 g/dL (ref 6.5–8.1)

## 2022-10-09 MED ORDER — SODIUM CHLORIDE 0.9 % IV SOLN
3.6000 mg/kg | Freq: Once | INTRAVENOUS | Status: AC
  Administered 2022-10-09: 220 mg via INTRAVENOUS
  Filled 2022-10-09: qty 8

## 2022-10-09 MED ORDER — SODIUM CHLORIDE 0.9% FLUSH
10.0000 mL | INTRAVENOUS | Status: DC | PRN
  Administered 2022-10-09 (×2): 10 mL

## 2022-10-09 MED ORDER — CYANOCOBALAMIN 1000 MCG/ML IJ SOLN
1000.0000 ug | Freq: Once | INTRAMUSCULAR | Status: AC
  Administered 2022-10-09: 1000 ug via INTRAMUSCULAR
  Filled 2022-10-09: qty 1

## 2022-10-09 MED ORDER — ONDANSETRON HCL 4 MG/2ML IJ SOLN
8.0000 mg | Freq: Once | INTRAMUSCULAR | Status: AC
  Administered 2022-10-09: 8 mg via INTRAVENOUS
  Filled 2022-10-09: qty 4

## 2022-10-09 MED ORDER — INV-TUCATINIB/PLACEBO 150 MG TABLET ALLIANCE A011801: 300.0000 mg | ORAL_TABLET | Freq: Two times a day (BID) | ORAL | 0 refills | Status: AC

## 2022-10-09 MED ORDER — DIPHENHYDRAMINE HCL 25 MG PO CAPS
50.0000 mg | ORAL_CAPSULE | Freq: Once | ORAL | Status: AC
  Administered 2022-10-09: 50 mg via ORAL
  Filled 2022-10-09: qty 2

## 2022-10-09 MED ORDER — SODIUM CHLORIDE 0.9% FLUSH
10.0000 mL | Freq: Once | INTRAVENOUS | Status: AC
  Administered 2022-10-09: 10 mL

## 2022-10-09 MED ORDER — HEPARIN SOD (PORK) LOCK FLUSH 100 UNIT/ML IV SOLN
500.0000 [IU] | Freq: Once | INTRAVENOUS | Status: AC | PRN
  Administered 2022-10-09: 500 [IU]

## 2022-10-09 MED ORDER — ACETAMINOPHEN 325 MG PO TABS
650.0000 mg | ORAL_TABLET | Freq: Once | ORAL | Status: AC
  Administered 2022-10-09: 650 mg via ORAL
  Filled 2022-10-09: qty 2

## 2022-10-09 MED ORDER — SODIUM CHLORIDE 0.9 % IV SOLN: Freq: Once | INTRAVENOUS | Status: AC

## 2022-10-09 MED ORDER — GOSERELIN ACETATE 3.6 MG ~~LOC~~ IMPL
3.6000 mg | DRUG_IMPLANT | Freq: Once | SUBCUTANEOUS | Status: AC
  Administered 2022-10-09: 3.6 mg via SUBCUTANEOUS
  Filled 2022-10-09: qty 3.6

## 2022-10-09 NOTE — Progress Notes (Signed)
Patient declined to stay for post infusion 30-minute observation.

## 2022-10-09 NOTE — Assessment & Plan Note (Addendum)
Palpable lump in the right breast. Diagnostic mammogram and [place]: a 3.8 cm mass at the retroareolar right breast, 1 prominent right axillary lymph node with a 4 mm cortex, 1 other which is borderline measuring 3 mm. 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  3 o'clock position: Biopsy fibroadenoma   Treatment plan: 1. Neoadjuvant chemotherapy with TCH Perjeta 6 cycles followed by Herceptin Perjeta maintenance versus Kadcyla maintenance (based on response to neoadjuvant chemo) for 1 year 2. Bilateral Mastectomies: Left mastectomy: Benign Right mastectomy: Multifocal ILC 1.8 cm, 0.9 cm, margins negative, 1/2 lymph nodes positive, ER 50%, PR 30%, HER2 1+ IHC 3.  [facility]ass HER2 clinical trial: Randomization between Kadcyla versus Kadcyla plus Tucatinib 4. Followed by adjuvant radiation therapy  5.  Followed by antiestrogen therapy   ------------------------------------------------------------------------------------------------------------------------ Current treatment: Kadcyla cycle 10 maintenance (Compass HER2 clinical trial) + Tucatinib vs Placebo Echocardiogram March 2023: EF 55 to 60%   Hospitalization 01/22/2022-01/26/2022 abscess/cellulitis required removal of the implant The plan is now a delayed reconstruction   Adverse Effects: Tolerating it well 1. Mild diarrhea: No replace with constipation 2. mild nausea: Resolved 3.  Fatigue: No longer an issue 4.  Nailbed irritation: Probably related to the study drug.  It is grade 1.  She is applying topical cortisone.  This appears to be helping.  If it gets worse then we will consider treating her with either an antifungal or antiinflammatory medications.   Worsening fatigue: On monthly B12 injections  She has seen Dr. Veda Canning plastic surgery who plans to do a latissimus dorsi flap reconstruction after Kadcyla is completed.   Return to clinic in 3 weeks for next cycle of  Kadcyla. Her port can be removed after Steward Drone is completed in February

## 2022-10-09 NOTE — Research (Cosign Needed Addendum)
CompassHER2 Residual Disease, A Double-Blinded, Phase III Randomized Trial of T-DM1 and Placebo Compared with T-DMI and Tucatinib  Patient presented to clinic unaccompanied this afternoon for cycle 12 day 1. Of note, cycle was delayed from 10/04/22 to today to accommodate the Christmas and New Year holidays (allowed per protocol; typical window is +/- 3 days, up to 7 day delay acceptable for major holidays).   LABS: CBC and CMP collected via port-a-cath per consent and study protocol. Patient tolerated well without complaint. Urine pregnancy collected per institutional guidelines.   VITAL SIGNS: Completed, including weight. See vital signs flowsheet.   H&P: Completed by Dr Lindi Adie. See clinic note dated 10/09/22. Provider reviewed lab results and cleared patient to continue treatment.   RESEARCH: Concomitant medications reviewed; patient noted she took benadryl and miralax PRN. Medication list updated and verified with patient.  Solicited and other adverse events assessed per protocol. Patient reported that she again had mild anorexia (grade 1) for three days following her T-DM1 infusion. C12D1 labs showed mild hypokalemia (grade 1). Patient also noted several new AEs. Patient reported mild, ongoing constipation (grade 1) that is improving with miralax PRN; Dr Lindi Adie noted that this is unrelated to study treatment. She reported several episodes of a transient rash; a mild maculo-papular rash (grade 1) on her bilateral hands and wrists that lasted approximately four days, as well as two episodes of an allergic reaction/hives (grade 1) on her face that resolved quickly. She noted that some of her ongoing AEs remained bothersome; she reported that her nail (cuticle) infection (grade 1) persisted, though she thinks it is slowly improving. Dr Lindi Adie now believes this and other skin changes are probably related to study treatment. Patient noted ongoing fatigue (grade 1 -- baseline), mild muscle weakness (grade 1)  in her arms as well as ongoing moderate disturbed sleep/insomnia (grade 2). Patient's breast pain (grade 1) and bruising (grade 1) were also unchanged and ongoing. See table below.  Patient returned remaining cycle 11 tucatinib/placebo and medication diary. She returned two bottles, one empty bottle and one bottle with 30 tablets remaining (0 + 30 = 30; 100 total tablets dispensed - 30 remaining = 70 tablets taken). Medication diary reflects the same: 70 tablets taken; 35 total doses. Patient had taken cycle 12 day 1 morning dose from cycle 11 supply, as appointment was in the afternoon. Noted on cycle 12 medication diary. Remaining tucatinib/placebo returned to pharmacy.   Cycle 12 tucatinib/placebo dispensed by pharmacy. Research RN verified that drug had correct patient name and study ID number. Drug was dispensed in two bottles of 50 '150mg'$  tablets, lot #[id number] (bottle 1: [id number], bottle 2: [id number]), exp 05/2023. Patient instructed to continue taking two tablets ('300mg'$  total) orally twice daily and to continue recording doses and side effects on medication diary. Reinforced importance of compliance with study medication. Patient verbalized understanding. Patient was given cycle 12 medication diary.   INFUSION: Patient received clinic standard pre-medications for T-DM1 treatment -- '650mg'$  acetaminophen PO and '50mg'$  diphenhydramine PO. PKs not required this cycle per protocol. Patient tolerated infusion well without complaint.   ADVERSE EVENT LOG: Study/Protocol: [facility] [id number] CompassHER2 Cycle 12  Baseline: anxiety (grade 2), fatigue (grade 1), herpes simplex virus, hypotension (grade 1), vascular access complication (grade 3). Not reportable (unrelated/unlikely to be related to study treatment and < grade 3): breast pain (grade 1), bruising (grade 1), constipation (grade 1), muscle weakness -- upper limb (grade 1).  Event (solicited vs other reportable) Grade CTCAE v5.0 Onset Date Resolved  Date  Attribution to Study Treatment Treatment Comments  Neutrophil count decreased 0 N/A N/A N/A N/A   Platelet count decreased 0 N/A N/A N/A N/A   Rash (maculo-papular) 1 09/29/22 10/02/22 Probable Hydrocortisone cream PRN Patient noted rash on BIL hands and wrists; resolved.  Epistaxis 0 N/A N/A N/A N/A    Diarrhea 0 N/A N/A N/A N/A Patient reported a baseline of 2-3 stools/day.   Nausea 0 N/A N/A N/A N/A   Vomiting 0 N/A N/A N/A N/A   Bilirubin increased 0 N/A N/A N/A N/A    AST increased 0 N/A N/A N/A N/A    ALT increased 0 N/A N/A N/A N/A    Anemia 0 N/A N/A N/A N/A    Mucositis oral 0 N/A N/A N/A N/A    Palmar-plantar erythrodysesthesia 0 N/A N/A N/A N/A    Peripheral neuropathy -- motor 0 N/A N/A N/A N/A    Peripheral neuropathy -- sensory 0 N/A N/A N/A N/A   Anorexia 1 09/13/22 09/15/22 Probable N/A Patient reported loss of appetite x3 days after T-DM1 infusion.  Insomnia 2 06/22/22 Ongoing Probable Adjustments to medication schedule Patient reported moderate disturbed sleep.  Nail infection 1 08/28/22 Ongoing Probable (previously noted as unrelated) Clindamycin gel + hydrocortisone cream PRN Patient reported that her nails/cuticles have continued to be irritated/inflamed.  Allergic reaction (face) 1 09/24/22 09/24/22 Probable Benadryl PRN Patient noted transient swelling/hives on her face; resolved quickly.  Allergic reaction (left eye) 1 09/30/22 09/30/22 Probable Benadryl PRN Patient noted transient swelling of her left eye; resolved quickly.  Hypokalemia 1 10/09/22 Ongoing Probable N/A Noted on C12D1 labs.   PLAN: Next treatment visit is 11/01/22. Visit will include labs, a provider visit, and an infusion appointment. Patient reminded to bring remaining tucatinib/placebo tablets and cycle 12 medication diary to C13D1 appointment. Patient verbalized understanding. Research will meet with patient and accompany her to appointments on C13D1. Patient reminded to continue to fill out ePROs as they  become available; next window begins on 10/23/22. Patient thanked for her time and continued voluntary participation in this study. Patient has been provided direct contact information and is encouraged to contact this nurse for any questions or concerns.   Vickii Penna, RN, BSN, CPN Clinical Research Nurse I [phone]  10/09/2022 3:25 PM

## 2022-10-11 ENCOUNTER — Ambulatory Visit: Payer: 59 | Attending: Hematology and Oncology

## 2022-10-11 ENCOUNTER — Encounter: Payer: Self-pay | Admitting: Hematology and Oncology

## 2022-10-11 ENCOUNTER — Other Ambulatory Visit: Payer: Self-pay | Admitting: *Deleted

## 2022-10-11 DIAGNOSIS — M25611 Stiffness of right shoulder, not elsewhere classified: Secondary | ICD-10-CM | POA: Insufficient documentation

## 2022-10-11 DIAGNOSIS — C50411 Malignant neoplasm of upper-outer quadrant of right female breast: Secondary | ICD-10-CM | POA: Insufficient documentation

## 2022-10-11 DIAGNOSIS — Z170 Estrogen receptor positive status [ER+]: Secondary | ICD-10-CM

## 2022-10-11 DIAGNOSIS — R293 Abnormal posture: Secondary | ICD-10-CM | POA: Insufficient documentation

## 2022-10-11 NOTE — Progress Notes (Signed)
Pt requesting colonoscopy.  Per MD referral placed to [facility] to establish care.

## 2022-10-12 ENCOUNTER — Other Ambulatory Visit: Payer: Self-pay | Admitting: General Surgery

## 2022-10-16 ENCOUNTER — Ambulatory Visit: Payer: 59

## 2022-10-22 ENCOUNTER — Other Ambulatory Visit: Payer: Self-pay | Admitting: Hematology and Oncology

## 2022-10-22 ENCOUNTER — Encounter: Payer: Self-pay | Admitting: Gastroenterology

## 2022-10-25 ENCOUNTER — Ambulatory Visit: Payer: 59 | Admitting: Hematology and Oncology

## 2022-10-25 ENCOUNTER — Other Ambulatory Visit: Payer: 59

## 2022-10-25 ENCOUNTER — Ambulatory Visit: Payer: 59

## 2022-10-31 NOTE — Assessment & Plan Note (Signed)
Palpable lump in the right breast. Diagnostic mammogram and US: a 3.8 cm mass at the retroareolar right breast, 1 prominent right axillary lymph node with a 4 mm cortex, 1 other which is borderline measuring 3 mm. 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  3 o'clock position: Biopsy fibroadenoma   Treatment plan: 1. Neoadjuvant chemotherapy with TCH Perjeta 6 cycles followed by Herceptin Perjeta maintenance versus Kadcyla maintenance (based on response to neoadjuvant chemo) for 1 year 2. Bilateral Mastectomies: Left mastectomy: Benign Right mastectomy: Multifocal ILC 1.8 cm, 0.9 cm, margins negative, 1/2 lymph nodes positive, ER 50%, PR 30%, HER2 1+ IHC 3.  Compass HER2 clinical trial: Randomization between Kadcyla versus Kadcyla plus Tucatinib 4. Followed by adjuvant radiation therapy  5.  Followed by antiestrogen therapy   ------------------------------------------------------------------------------------------------------------------------ Current treatment: Kadcyla cycle 10 maintenance (Compass HER2 clinical trial) + Tucatinib vs Placebo Echocardiogram March 2023: EF 55 to 60%   Hospitalization 01/22/2022-01/26/2022 abscess/cellulitis required removal of the implant The plan is now a delayed reconstruction   Adverse Effects: Tolerating it well 1. Mild diarrhea: No replace with constipation 2. mild nausea: Resolved 3.  Fatigue: No longer an issue 4.  Nailbed irritation: Probably related to the study drug.  It is grade 1.  She is applying topical cortisone.  This appears to be helping.  If it gets worse then we will consider treating her with either an antifungal or antiinflammatory medications.   Worsening fatigue: On monthly B12 injections  She has seen Dr. Yemi plastic surgery who plans to do a latissimus dorsi flap reconstruction after Kadcyla is completed.   Return to clinic in 3 weeks for next cycle of  Kadcyla. Her port can be removed after Kadcyla is completed in February 

## 2022-11-01 ENCOUNTER — Inpatient Hospital Stay: Payer: 59

## 2022-11-01 ENCOUNTER — Inpatient Hospital Stay: Payer: 59 | Admitting: Hematology and Oncology

## 2022-11-01 ENCOUNTER — Other Ambulatory Visit: Payer: Self-pay

## 2022-11-01 ENCOUNTER — Encounter: Payer: Self-pay | Admitting: *Deleted

## 2022-11-01 VITALS — BP 116/55 | HR 98 | Temp 97.2°F | Resp 17 | Wt 127.1 lb

## 2022-11-01 VITALS — BP 120/60 | HR 91 | Temp 98.9°F | Resp 16

## 2022-11-01 DIAGNOSIS — Z170 Estrogen receptor positive status [ER+]: Secondary | ICD-10-CM | POA: Diagnosis not present

## 2022-11-01 DIAGNOSIS — Z95828 Presence of other vascular implants and grafts: Secondary | ICD-10-CM

## 2022-11-01 DIAGNOSIS — C50411 Malignant neoplasm of upper-outer quadrant of right female breast: Secondary | ICD-10-CM

## 2022-11-01 DIAGNOSIS — I427 Cardiomyopathy due to drug and external agent: Secondary | ICD-10-CM

## 2022-11-01 DIAGNOSIS — Z0000 Encounter for general adult medical examination without abnormal findings: Secondary | ICD-10-CM

## 2022-11-01 DIAGNOSIS — Z5111 Encounter for antineoplastic chemotherapy: Secondary | ICD-10-CM | POA: Diagnosis not present

## 2022-11-01 LAB — CBC WITH DIFFERENTIAL (CANCER CENTER ONLY)
Abs Immature Granulocytes: 0.02 10*3/uL (ref 0.00–0.07)
Basophils Absolute: 0.1 10*3/uL (ref 0.0–0.1)
Basophils Relative: 1 %
Eosinophils Absolute: 0.5 10*3/uL (ref 0.0–0.5)
Eosinophils Relative: 7 %
HCT: 37.6 % (ref 36.0–46.0)
Hemoglobin: 13.3 g/dL (ref 12.0–15.0)
Immature Granulocytes: 0 %
Lymphocytes Relative: 22 %
Lymphs Abs: 1.4 10*3/uL (ref 0.7–4.0)
MCH: 33.4 pg (ref 26.0–34.0)
MCHC: 35.4 g/dL (ref 30.0–36.0)
MCV: 94.5 fL (ref 80.0–100.0)
Monocytes Absolute: 0.5 10*3/uL (ref 0.1–1.0)
Monocytes Relative: 8 %
Neutro Abs: 3.8 10*3/uL (ref 1.7–7.7)
Neutrophils Relative %: 62 %
Platelet Count: 195 10*3/uL (ref 150–400)
RBC: 3.98 MIL/uL (ref 3.87–5.11)
RDW: 13.9 % (ref 11.5–15.5)
WBC Count: 6.2 10*3/uL (ref 4.0–10.5)
nRBC: 0 % (ref 0.0–0.2)

## 2022-11-01 LAB — LIPID PANEL
Cholesterol: 161 mg/dL (ref 0–200)
HDL: 72 mg/dL (ref >40)
LDL Cholesterol: 81 mg/dL (ref 0–99)
Total CHOL/HDL Ratio: 2.2 RATIO
Triglycerides: 40 mg/dL (ref <150)
VLDL: 8 mg/dL (ref 0–40)

## 2022-11-01 LAB — CMP (CANCER CENTER ONLY)
ALT: 25 U/L (ref 0–44)
AST: 32 U/L (ref 15–41)
Albumin: 4 g/dL (ref 3.5–5.0)
Alkaline Phosphatase: 143 U/L — ABNORMAL HIGH (ref 38–126)
Anion gap: 6 (ref 5–15)
BUN: 14 mg/dL (ref 6–20)
CO2: 28 mmol/L (ref 22–32)
Calcium: 9.3 mg/dL (ref 8.9–10.3)
Chloride: 107 mmol/L (ref 98–111)
Creatinine: 0.94 mg/dL (ref 0.44–1.00)
GFR, Estimated: >60 mL/min (ref >60)
Glucose, Bld: 80 mg/dL (ref 70–99)
Potassium: 3.8 mmol/L (ref 3.5–5.1)
Sodium: 141 mmol/L (ref 135–145)
Total Bilirubin: 0.7 mg/dL (ref 0.3–1.2)
Total Protein: 7.3 g/dL (ref 6.5–8.1)

## 2022-11-01 LAB — PREGNANCY, URINE: Preg Test, Ur: NEGATIVE

## 2022-11-01 LAB — HEMOGLOBIN A1C
Hgb A1c MFr Bld: 4.9 % (ref 4.8–5.6)
Mean Plasma Glucose: 93.93 mg/dL

## 2022-11-01 MED ORDER — ACETAMINOPHEN 325 MG PO TABS
650.0000 mg | ORAL_TABLET | Freq: Once | ORAL | Status: AC
  Administered 2022-11-01: 650 mg via ORAL
  Filled 2022-11-01: qty 2

## 2022-11-01 MED ORDER — SODIUM CHLORIDE 0.9 % IV SOLN
3.6000 mg/kg | Freq: Once | INTRAVENOUS | Status: AC
  Administered 2022-11-01: 200 mg via INTRAVENOUS
  Filled 2022-11-01: qty 10

## 2022-11-01 MED ORDER — SODIUM CHLORIDE 0.9% FLUSH
10.0000 mL | Freq: Once | INTRAVENOUS | Status: AC
  Administered 2022-11-01: 10 mL

## 2022-11-01 MED ORDER — DIPHENHYDRAMINE HCL 25 MG PO CAPS
50.0000 mg | ORAL_CAPSULE | Freq: Once | ORAL | Status: AC
  Administered 2022-11-01: 50 mg via ORAL
  Filled 2022-11-01: qty 2

## 2022-11-01 MED ORDER — INV-TUCATINIB/PLACEBO 150 MG TABLET ALLIANCE A011801: 300.0000 mg | ORAL_TABLET | Freq: Two times a day (BID) | ORAL | 0 refills | Status: AC

## 2022-11-01 MED ORDER — ONDANSETRON HCL 4 MG/2ML IJ SOLN
8.0000 mg | Freq: Once | INTRAMUSCULAR | Status: AC
  Administered 2022-11-01: 8 mg via INTRAVENOUS
  Filled 2022-11-01: qty 4

## 2022-11-01 MED ORDER — HEPARIN SOD (PORK) LOCK FLUSH 100 UNIT/ML IV SOLN
500.0000 [IU] | Freq: Once | INTRAVENOUS | Status: AC | PRN
  Administered 2022-11-01: 500 [IU]

## 2022-11-01 MED ORDER — SODIUM CHLORIDE 0.9% FLUSH
10.0000 mL | INTRAVENOUS | Status: DC | PRN
  Administered 2022-11-01: 10 mL

## 2022-11-01 MED ORDER — SODIUM CHLORIDE 0.9 % IV SOLN: Freq: Once | INTRAVENOUS | Status: AC

## 2022-11-01 NOTE — Progress Notes (Signed)
Patient Care Team: Dian Queen, MD as PCP - General (Obstetrics and Gynecology) Dian Queen, MD (Obstetrics and Gynecology) Mauro Kaufmann, RN as Oncology Nurse Navigator Carlynn Spry, Charlott Holler, RN as Oncology Nurse Navigator Nicholas Lose, MD as Consulting Physician (Hematology and Oncology) Gery Pray, MD as Consulting Physician (Radiation Oncology) Rolm Bookbinder, MD as Consulting Physician (General Surgery)  DIAGNOSIS:  Encounter Diagnoses  Name Primary?   Malignant neoplasm of upper-outer quadrant of right breast in female, estrogen receptor positive ([place]) Yes   Cardiotoxicity ([place])     SUMMARY OF ONCOLOGIC HISTORY: Oncology History  Malignant neoplasm of upper-outer quadrant of right breast in female, estrogen receptor positive ([place])  06/23/2021 Initial Diagnosis   Palpable lump in the right breast. Diagnostic mammogram and [place]: a 3.8 cm mass at the retroareolar right breast, 1 prominent right axillary lymph node with a 4 mm cortex, 1 other which is borderline measuring 3 mm. 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  3 o'clock position: Biopsy fibroadenoma   07/12/2021 -  Neo-Adjuvant Chemotherapy   Neoadjuvant chemotherapy with TCH Perjeta 6 cycles followed by Herceptin Perjeta maintenance versus Kadcyla maintenance (based on response to neoadjuvant chemo) for 1 year   07/28/2021 Genetic Testing   Negative hereditary cancer genetic testing: no pathogenic variants detected in [facility] CancerNext-Expanded +RNAinsight Panel.  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  The report date is July 28, 2021.   The CancerNext-Expanded gene panel offered by [facility] and includes sequencing, rearrangement, and RNA analysis for the following 77 genes: AIP, ALK, APC, ATM, AXIN2, BAP1, BARD1, BLM, BMPR1A, BRCA1, BRCA2, BRIP1, CDC73, CDH1, CDK4, CDKN1B, CDKN2A, CHEK2,  CTNNA1, DICER1, FANCC, FH, FLCN, GALNT12, KIF1B, LZTR1, MAX, MEN1, MET, MLH1, MSH2, MSH3, MSH6, MUTYH, NBN, NF1, NF2, NTHL1, PALB2, PHOX2B, PMS2, POT1, PRKAR1A, PTCH1, PTEN, RAD51C, RAD51D, RB1, RECQL, RET, SDHA, SDHAF2, SDHB, SDHC, SDHD, SMAD4, SMARCA4, SMARCB1, SMARCE1, STK11, SUFU, TMEM127, TP53, TSC1, TSC2, VHL and XRCC2 (sequencing and deletion/duplication); EGFR, EGLN1, HOXB13, KIT, MITF, PDGFRA, POLD1, and POLE (sequencing only); EPCAM and GREM1 (deletion/duplication only).    12/06/2021 Surgery   Bilateral Mastectomies: Left mastectomy: Benign Right mastectomy: Multifocal ILC 1.8 cm, 0.9 cm, margins negative, 1/2 lymph nodes positive, ER 50%, PR 30%, HER2 1+ IHC   12/26/2021 - 12/29/2021 Chemotherapy   Patient is on Treatment Plan : BREAST ADO-Trastuzumab Emtansine (Kadcyla) q21d       CHIEF COMPLIANT: Follow-up of right breast cancer on Cycle 14 Kadcyla.    INTERVAL HISTORY: Jenna Cross is a 37 y.o with the above- mentioned right breast cancer. Currently on treatment with Kadcyla. She presents to the clinic today for treatment and a follow-up. She denies any new side effects. She states she did develop a cough. She denies any fevers, and says she didn't feel ill from the cough. She did have a bloody 1 nose.   ALLERGIES:  is allergic to wound dressings.  MEDICATIONS:  Current Outpatient Medications  Medication Sig Dispense Refill   acetaminophen (TYLENOL) 500 MG tablet Take 1,000 mg by mouth every 6 (six) hours as needed for mild pain or headache.     ALPRAZolam (XANAX) 0.5 MG tablet Take 0.5 mg by mouth 3 (three) times daily as needed for anxiety.     clindamycin (CLINDAGEL) 1 % gel Apply topically 2 (two) times daily. Apply to affect area two times daily 30 g 1   diphenhydrAMINE (BENADRYL) 25 mg capsule Take 25 mg by  mouth every 6 (six) hours as needed for itching or allergies.     diphenoxylate-atropine (LOMOTIL) 2.5-0.025 MG tablet Take 1 tablet by mouth 3 (three) times  daily as needed for diarrhea or loose stools. 30 tablet 3   Goserelin Acetate (ZOLADEX [place])      hydrocortisone cream 0.5 % Apply 1 Application topically as needed for itching.     letrozole (FEMARA) 2.5 MG tablet Take 1 tablet (2.5 mg total) by mouth daily. 90 tablet 3   lidocaine-prilocaine (EMLA) cream lidocaine-prilocaine 2.5 %-2.5 % topical cream  APPLY TO AFFECTED AREA ONCE AS DIRECTED     loratadine (CLARITIN) 10 MG tablet Take 10 mg by mouth daily as needed for allergies.     LORazepam (ATIVAN) 0.5 MG tablet TAKE 1 TABLET (0.5 MG TOTAL) BY MOUTH AT BEDTIME AS NEEDED (NAUSEA OR VOMITING).     ondansetron (ZOFRAN) 8 MG tablet TAKE 1 TABLET BY MOUTH EVERY 8 HOURS AS NEEDED FOR NAUSEA 30 tablet 3   polyethylene glycol (MIRALAX / GLYCOLAX) 17 g packet Take 17 g by mouth daily as needed for mild constipation.     valACYclovir (VALTREX) 500 MG tablet Take 500 mg by mouth every evening.     venlafaxine XR (EFFEXOR-XR) 37.5 MG 24 hr capsule Take 1 capsule (37.5 mg total) by mouth daily with breakfast. 90 capsule 1   XARELTO 20 MG TABS tablet TAKE 1 TABLET BY MOUTH DAILY WITH SUPPER. 30 tablet 5   Investigational tucatanib/placebo 150 MG tablet (ONT-380,NSC #[id number]) [facility] [id number] Take 2 tablets (300 mg total) 2 (two) times dailyfor 21 days.  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 Tucatinib or placebo should take orally twice daily with or without food, approximately 12 hours apart between doses. Tablets must be swallowed whole and may not be crushed, chewed or dissolved in liquid. CAUTION: Chemotherapy/Biotherapy 84 tablet 0   No current facility-administered medications for this visit.   Facility-Administered Medications Ordered in Other Visits  Medication Dose Route Frequency Provider Last Rate Last Admin   0.9 %  sodium chloride infusion   Intravenous Once Nicholas Lose, MD       acetaminophen (TYLENOL) tablet 650 mg  650 mg Oral Once Nicholas Lose, MD       ado-trastuzumab emtansine (KADCYLA) 200 mg in  sodium chloride 0.9 % 250 mL chemo infusion  3.6 mg/kg (Treatment Plan Recorded) Intravenous Once Nicholas Lose, MD       diphenhydrAMINE (BENADRYL) capsule 50 mg  50 mg Oral Once Nicholas Lose, MD       heparin lock flush 100 unit/mL  500 Units Intracatheter Once PRN Nicholas Lose, MD       ondansetron (ZOFRAN) injection 8 mg  8 mg Intravenous Once Nicholas Lose, MD       sodium chloride flush (NS) 0.9 % injection 10 mL  10 mL Intracatheter PRN Nicholas Lose, MD        PHYSICAL EXAMINATION: ECOG PERFORMANCE STATUS: 1 - Symptomatic but completely ambulatory  Vitals:   11/01/22 0909  BP: (!) 116/55  Pulse: 98  Resp: 17  Temp: (!) 97.2 F (36.2 C)  SpO2: 97%   Filed Weights   11/01/22 0909  Weight: 127 lb 1.6 oz (57.7 kg)      LABORATORY DATA:  I have reviewed the data as listed    Latest Ref Rng & Units 11/01/2022    8:49 AM 10/09/2022    1:34 PM 09/13/2022    9:01 AM  CMP  Glucose 70 - 99 mg/dL 80  87  97   BUN 6 - 20 mg/dL '14  10  9   '$ Creatinine 0.44 - 1.00 mg/dL 0.94  0.79  0.81   Sodium 135 - 145 mmol/L 141  140  141   Potassium 3.5 - 5.1 mmol/L 3.8  3.4  3.9   Chloride 98 - 111 mmol/L 107  104  106   CO2 22 - 32 mmol/L '28  29  29   '$ Calcium 8.9 - 10.3 mg/dL 9.3  9.6  9.7   Total Protein 6.5 - 8.1 g/dL 7.3  7.4  7.3   Total Bilirubin 0.3 - 1.2 mg/dL 0.7  0.9  0.6   Alkaline Phos 38 - 126 U/L 143  102  110   AST 15 - 41 U/L 32  27  28   ALT 0 - 44 U/L '25  21  21     '$ Lab Results  Component Value Date   WBC 6.2 11/01/2022   HGB 13.3 11/01/2022   HCT 37.6 11/01/2022   MCV 94.5 11/01/2022   PLT 195 11/01/2022   NEUTROABS 3.8 11/01/2022    ASSESSMENT & PLAN:  Malignant neoplasm of upper-outer quadrant of right breast in female, estrogen receptor positive ([facility]) Palpable lump in the right breast. Diagnostic mammogram and [place]: a 3.8 cm mass at the retroareolar right breast, 1 prominent right axillary lymph node with a 4 mm cortex, 1 other which is borderline measuring  3 mm. 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  3 o'clock position: Biopsy fibroadenoma   Treatment plan: 1. Neoadjuvant chemotherapy with TCH Perjeta 6 cycles followed by Herceptin Perjeta maintenance versus Kadcyla maintenance (based on response to neoadjuvant chemo) for 1 year 2. Bilateral Mastectomies: Left mastectomy: Benign Right mastectomy: Multifocal ILC 1.8 cm, 0.9 cm, margins negative, 1/2 lymph nodes positive, ER 50%, PR 30%, HER2 1+ IHC 3.  Compass HER2 clinical trial: Randomization between Kadcyla versus Kadcyla plus Tucatinib 4. Followed by adjuvant radiation therapy  5.  Followed by antiestrogen therapy   ------------------------------------------------------------------------------------------------------------------------ Current treatment: Kadcyla cycle 14 maintenance (Compass HER2 clinical trial) + Tucatinib vs Placebo Echocardiogram March 2023: EF 55 to 60%   Hospitalization 01/22/2022-01/26/2022 abscess/cellulitis required removal of the implant The plan is now a delayed reconstruction   Adverse Effects: Tolerating it well 1. Mild diarrhea: No longer an issue 2. mild nausea: Resolved     She will come next week for Zoladex injection  She has seen Dr. Veda Canning plastic surgery who plans to do a latissimus dorsi flap reconstruction after Kadcyla is completed.   Return to clinic in 3 weeks for next cycle of Kadcyla. Her port can be removed after [facility] Drone is completed in February We discussed [employer] testing after completion of Kadcyla.    Orders Placed This Encounter  Procedures   ECHOCARDIOGRAM COMPLETE    Standing Status:   Future    Standing Expiration Date:   11/02/2023    Order Specific Question:   Where should this test be performed    Answer:   [place]    Order Specific Question:   Perflutren DEFINITY (image enhancing agent) should be administered unless hypersensitivity or  allergy exist    Answer:   Administer Perflutren    Order Specific Question:   Reason for exam-Echo    Answer:   Chemo  Z09   The patient has a good understanding of the overall plan. she agrees with it. she will call with any problems that  may develop before the next visit here. Total time spent: 30 mins including face to face time and time spent for planning, charting and co-ordination of care   Harriette Ohara, MD 11/01/22    I Gardiner Coins am acting as a Education administrator for [employer]  I have reviewed the above documentation for accuracy and completeness, and I agree with the above.

## 2022-11-01 NOTE — Research (Unsigned)
CompassHER2 Residual Disease, A Double-Blinded, Phase III Randomized Trial of T-DM1 and Placebo Compared with T-DMI and Tucatinib  Patient presented to clinic unaccompanied this morning for cycle 13 day 1.    ePROs: Patient completed on 10/25/22.   LABS: CBC and CMP collected via port-a-cath per consent and study protocol. Patient tolerated well without complaint. Urine pregnancy collected per institutional guidelines. Other non-study related labs (lipid panel, tsh and hgb A1-C) were also drawn per patient request and Dr. Geralyn Flash orders.  VITAL SIGNS: Completed, including weight. See vital signs flowsheet.   H&P: Completed by Dr Lindi Adie. See clinic note dated today 11/01/22. Provider reviewed lab results and adverse events and cleared patient to continue treatment.   RESEARCH: Concomitant medications reviewed; patient noted no changes since last visit.  Adverse Events:  Solicited and other adverse events assessed/reported per protocol.  Patient reported the following; Mild anorexia (grade 1) for three days following her T-DM1 infusion.   She reported previous resolution of mild maculo-papular rash (grade 1) but now reports it was not 100% resolved and she continues to have some lingering red, flaky and itchy patches on her hands. This RN noted patient to have several small patches of reddened, flaky skin on the dorsal aspect of bilateral hands at the base of her fingers. Patient says she she feels this is mostly resolved but she continues to take benadryl and use OTC hydrocortisone cream occasionally prn.  She denies any new rashes.  Nail (cuticle) infection (grade 1) is still improving although not completely resolved.    Moderate disturbed sleep/insomnia (grade 2) intermittent.  Ongoing fatigue (grade 1 -- baseline). Denies constipation since last visit.  Denies breast pain since last visit.   Denies any new bruising since last visit.  Denies muscle weakness since last visit.   Lab results  show hypokalemia resolved.  New mild nausea (grade 1) which started about 3 days after her last T-DMI infusion and lasted 2 to 3 days. She denies vomiting and states she took zofran once or twice for the nausea which helped. Dr. Lindi Adie states this is possibly related to T-DMI and/or Tucatinib/Placebo.  New cough (grade 1) which started the day after her last study visit 3 weeks ago. She describes it as intermittent and mild without any associated symptoms. She denies any fevers and states she "didn't feel bad."  Dr. Lindi Adie states it is not related to T-DMI or study drug.  New bloody nose/epistaxis about 5 days ago.  It took about 30 minutes of holding pressure for it to stop and it has not returned. Dr. Lindi Adie states this is not related to T-DMI or study drug. He believes it is related to the cold weather and dry inside air.  Today's lab showed alkaline phosphatase increase (grade 1) which Dr. Lindi Adie states this is unlikely related to T-DMI and/or Tucatinib/Placebo. See AE table below.  Study Drug:  Patient returned remaining cycle 12 tucatinib/placebo and medication diary. She returned two bottles, one empty bottle and one bottle with 38 tablets remaining (0 + 38 = 38; 100 total tablets dispensed - 38 remaining = 62 tablets taken). Note patient took cycle 12, day 1 morning dose from the cycle 11 supply so there are 2 extra pills in the cycle 12 supply (102-38 =64). Therefore patient actually took 64 tablets this cycle. Medication diary reflects the same: 64 tablets taken; 32 total doses.  Remaining cycle 12 tucatinib/placebo returned to Romualdo Bolk, Software engineer at [facility].   Cycle 13 tucatinib/placebo dispensed  by Carolann Littler, pharmacist at [facility]. Research RN verified that drug had correct patient name and study ID number. Drug was dispensed in two bottles of 50 '150mg'$  tablets, lot #[id number], exp 06/08/2023. Patient instructed to continue taking two tablets ('300mg'$  total) orally  twice daily and to continue recording doses and side effects on medication diary. Reinforced importance of compliance with study medication. Patient verbalized understanding. Patient was given cycle 13 medication diary.   Patient took Cycle 13, Day 1 study drug, 2 -150 mg tablets of tucatinib/placebo, in clinic at 10 am and it was documented in the cycle 13 medication diary.   INFUSION: Patient received clinic standard pre-medications for T-DM1 treatment -- '650mg'$  acetaminophen PO and '50mg'$  diphenhydramine PO. PKs not required this cycle per protocol. Patient tolerated infusion well without complaint.   ADVERSE EVENT LOG: Study/Protocol: [facility] [id number] CompassHER2 Assessed Cycle 13, Day 1   Baseline: anxiety (grade 2), fatigue (grade 1), herpes simplex virus, hypotension (grade 1), vascular access complication (grade 3). Not reportable (unrelated/unlikely to be related to study treatment and < grade 3): breast pain (grade 1), bruising (grade 1), constipation (grade 1), muscle weakness -- upper limb (grade 1).  Event (solicited vs other reportable) Grade CTCAE v5.0 Onset Date Resolved Date Attribution to Study Treatment Treatment Comments  Neutrophil count decreased 0 N/A N/A N/A N/A   Platelet count decreased 0 N/A N/A N/A N/A   Rash (maculo-papular) 1 09/29/22 Ongoing Probable Hydrocortisone cream & benadryl Patient noted rash on BIL hands and wrists improved but not completely resolved as previously reported.   Epistaxis 1 10/27/22 10/27/22 Unrelated Unrelated    Diarrhea 0 N/A N/A N/A N/A Patient reported a baseline of 2-3 stools/day.   Nausea 1 10/12/22 10/15/22 Possible Ondansetron PRN   Vomiting 0 N/A N/A N/A N/A   Bilirubin increased 0 N/A N/A N/A N/A    AST increased 0 N/A N/A N/A N/A    ALT increased 0 N/A N/A N/A N/A    Anemia 0 N/A N/A N/A N/A    Mucositis oral 0 N/A N/A N/A N/A    Palmar-plantar erythrodysesthesia 0 N/A N/A N/A N/A    Peripheral neuropathy -- motor 0 N/A N/A N/A N/A     Peripheral neuropathy -- sensory 0 N/A N/A N/A N/A   Anorexia 1 10/10/22 10/13/22 Probable N/A Patient reported loss of appetite x3 days after T-DM1 infusion.  Insomnia 2 06/22/22 Ongoing Probable Adjustments to medication schedule Patient reported moderate disturbed sleep.  Nail infection 1 08/28/22 Ongoing Probable Clindamycin gel + hydrocortisone cream PRN Patient reported that her nails/cuticles inflammation is improving  Cough 1 10/10/22 Ongoing Unrelated none   Hypokalemia 1 10/09/22 11/01/22 Probable N/A resolved  Increased Alkaline Phosphatase 1 11/01/22 Ongoing Unlikely none    PLAN: Next treatment visit is scheduled for 11/23/22. Visit will include labs, a provider visit, and an infusion appointment. Patient reminded to bring remaining tucatinib/placebo tablets and cycle 13 medication diary to C14D1 appointment. Patient verbalized understanding. Research will meet with patient and accompany her to appointments on C14D1. Patient has been provided direct contact information and is encouraged to contact this nurse for any questions or concerns.    Foye Spurling, BSN, RN, [place] Nurse II [phone] 11/01/2022

## 2022-11-01 NOTE — Patient Instructions (Signed)
[facility]  Discharge Instructions: Thank you for choosing [facility] to provide your oncology and hematology care.   If you have a lab appointment with the Cancer Center, please go directly to the Cancer Center and check in at the registration area.   Wear comfortable clothing and clothing appropriate for easy access to any Portacath or PICC line.   We strive to give you quality time with your provider. You may need to reschedule your appointment if you arrive late (15 or more minutes).  Arriving late affects you and other patients whose appointments are after yours.  Also, if you miss three or more appointments without notifying the office, you may be dismissed from the clinic at the provider's discretion.      For prescription refill requests, have your pharmacy contact our office and allow 72 hours for refills to be completed.    Today you received the following chemotherapy and/or immunotherapy agents: ado-trastuzumab emtansine      To help prevent nausea and vomiting after your treatment, we encourage you to take your nausea medication as directed.  BELOW ARE SYMPTOMS THAT SHOULD BE REPORTED IMMEDIATELY: *FEVER GREATER THAN 100.4 F (38 C) OR HIGHER *CHILLS OR SWEATING *NAUSEA AND VOMITING THAT IS NOT CONTROLLED WITH YOUR NAUSEA MEDICATION *UNUSUAL SHORTNESS OF BREATH *UNUSUAL BRUISING OR BLEEDING *URINARY PROBLEMS (pain or burning when urinating, or frequent urination) *BOWEL PROBLEMS (unusual diarrhea, constipation, pain near the anus) TENDERNESS IN MOUTH AND THROAT WITH OR WITHOUT PRESENCE OF ULCERS (sore throat, sores in mouth, or a toothache) UNUSUAL RASH, SWELLING OR PAIN  UNUSUAL VAGINAL DISCHARGE OR ITCHING   Items with * indicate a potential emergency and should be followed up as soon as possible or go to the Emergency Department if any problems should occur.  Please show the CHEMOTHERAPY ALERT CARD or IMMUNOTHERAPY  ALERT CARD at check-in to the Emergency Department and triage nurse.  Should you have questions after your visit or need to cancel or reschedule your appointment, please contact [facility]  Dept: [phone]  and follow the prompts.  Office hours are 8:00 a.m. to 4:30 p.m. Monday - Friday. Please note that voicemails left after 4:00 p.m. may not be returned until the following business day.  We are closed weekends and major holidays. You have access to a nurse at all times for urgent questions. Please call the main number to the clinic Dept: [phone] and follow the prompts.   For any non-urgent questions, you may also contact your provider using MyChart. We now offer e-Visits for anyone 18 and older to request care online for non-urgent symptoms. For details visit [email or web address].   Also download the MyChart app! Go to the app store, search "MyChart", open the app, select , and log in with your MyChart username and password.  Masks are optional in the cancer centers. If you would like for your care team to wear a mask while they are taking care of you, please let them know. You may have one support person who is at least 38 years old accompany you for your appointments. 

## 2022-11-01 NOTE — Progress Notes (Signed)
Patient declined to stay for 30 minute post-observation. Vital signs stable at discharge, tolerated infusion well. Patient ambulated independently to lobby.

## 2022-11-03 LAB — THYROID PANEL WITH TSH
Free Thyroxine Index: 2.3 (ref 1.2–4.9)
T3 Uptake Ratio: 27 % (ref 24–39)
T4, Total: 8.7 ug/dL (ref 4.5–12.0)
TSH: 2.18 u[IU]/mL (ref 0.450–4.500)

## 2022-11-05 ENCOUNTER — Encounter: Payer: Self-pay | Admitting: Hematology and Oncology

## 2022-11-05 ENCOUNTER — Telehealth: Payer: Self-pay | Admitting: Hematology and Oncology

## 2022-11-05 NOTE — Telephone Encounter (Signed)
Scheduled appointment per los. Patient is aware of the made appointments. 

## 2022-11-07 ENCOUNTER — Other Ambulatory Visit: Payer: Self-pay

## 2022-11-07 ENCOUNTER — Inpatient Hospital Stay: Payer: 59

## 2022-11-07 VITALS — BP 133/84 | HR 99 | Temp 98.1°F | Resp 18

## 2022-11-07 DIAGNOSIS — Z5111 Encounter for antineoplastic chemotherapy: Secondary | ICD-10-CM | POA: Diagnosis not present

## 2022-11-07 DIAGNOSIS — Z170 Estrogen receptor positive status [ER+]: Secondary | ICD-10-CM

## 2022-11-07 DIAGNOSIS — Z95828 Presence of other vascular implants and grafts: Secondary | ICD-10-CM

## 2022-11-07 MED ORDER — GOSERELIN ACETATE 3.6 MG ~~LOC~~ IMPL
3.6000 mg | DRUG_IMPLANT | Freq: Once | SUBCUTANEOUS | Status: AC
  Administered 2022-11-07: 3.6 mg via SUBCUTANEOUS
  Filled 2022-11-07: qty 3.6

## 2022-11-07 MED ORDER — CYANOCOBALAMIN 1000 MCG/ML IJ SOLN
1000.0000 ug | Freq: Once | INTRAMUSCULAR | Status: AC
  Administered 2022-11-07: 1000 ug via INTRAMUSCULAR
  Filled 2022-11-07: qty 1

## 2022-11-13 ENCOUNTER — Ambulatory Visit: Payer: 59 | Admitting: Gastroenterology

## 2022-11-13 ENCOUNTER — Other Ambulatory Visit: Payer: 59

## 2022-11-13 ENCOUNTER — Encounter: Payer: Self-pay | Admitting: Gastroenterology

## 2022-11-13 VITALS — BP 116/72 | HR 90 | Ht 64.0 in | Wt 128.0 lb

## 2022-11-13 DIAGNOSIS — Z1211 Encounter for screening for malignant neoplasm of colon: Secondary | ICD-10-CM

## 2022-11-13 NOTE — Patient Instructions (Signed)
If you are age 38 or older, your body mass index should be between 23-30. Your Body mass index is 21.97 kg/m. If this is out of the aforementioned range listed, please consider follow up with your Primary Care Provider.  If you are age 75 or younger, your body mass index should be between 19-25. Your Body mass index is 21.97 kg/m. If this is out of the aformentioned range listed, please consider follow up with your Primary Care Provider.  Your provider has requested that you go to the basement level for lab work before leaving today. Press "B" on the elevator. The lab is located at the first door on the left as you exit the elevator.     The [facility] providers would like to encourage you to use [facility] to communicate with providers for non-urgent requests or questions.  Due to long hold times on the telephone, sending your provider a message by [facility] may be a faster and more efficient way to get a response.  Please allow 48 business hours for a   It was a pleasure to see you today!  Thank you for trusting me with your gastrointestinal care!    Scott E.Candis Schatz, MD

## 2022-11-13 NOTE — Progress Notes (Unsigned)
HPI : Jenna Cross is a very pleasnt 37 year old female with a history of anxiety, breast cancer and DVT who is referred to [place] by Dr. Nicholas Lose for consideration of early colon cancer screening.  The patient has a history of IBS like symptoms since she was a teenager.  She was followed by Dr. Cristina Gong of [facility] gastroenterology through high school and college.  Her GI symptoms have been minimal more recently.  She does continue to have variable bowel habits and occasional mild abdominal discomfort.  She denies any blood in the stool. She denies any family history of colon cancer or advanced colon polyps.  Her weight has been stable since she completed chemotherapy.  The patient states that she is interested in early colon cancer screening due to her own personal history of breast cancer at a young age, as well as increasing reports of early onset colon cancer in the media.  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  No additional testing or screening was recommended by her genetic counselor because of this mutation.     Past Medical History:  Diagnosis Date   Anemia    pregnancy related   Anxiety    Anxiety    no medication   Cancer ([place])    breast   COVID 09/2020   mild case   Family history of prostate cancer 07/17/2021   History of DVT (deep vein thrombosis)    in her neck   History of radiation therapy    Right breast 02/22/22-04/13/22- Dr. Gery Pray     Past Surgical History:  Procedure Laterality Date   BREAST IMPLANT EXCHANGE Right 01/25/2022   Procedure: BREAST IMPLANT REMOVAL;  Surgeon: Wallace Going, DO;  Location: [place];  Service: Plastics;  Laterality: Right;  1 hour   BREAST RECONSTRUCTION WITH PLACEMENT OF TISSUE EXPANDER AND FLEX HD (ACELLULAR HYDRATED DERMIS) Bilateral 12/06/2021   Procedure: BREAST RECONSTRUCTION WITH PLACEMENT OF TISSUE  EXPANDER AND FLEX HD (ACELLULAR HYDRATED DERMIS);  Surgeon: Wallace Going, DO;  Location: [place];  Service: Plastics;  Laterality: Bilateral;   BREAST SURGERY     cyst removal from right breast    CESAREAN SECTION N/A 01/31/2021   Procedure: CESAREAN SECTION;  Surgeon: Dian Queen, MD;  Location: [place] [facility];  Service: Obstetrics;  Laterality: N/A;   CHG RADIOLOGICAL GUIDANCE PRQ DRG W/PLMT CATH RS&I  01/23/2022   excision of fibroadenoma of breast     PORTACATH PLACEMENT N/A 07/11/2021   Procedure: INSERTION PORT-A-CATH;  Surgeon: Rolm Bookbinder, MD;  Location: [place];  Service: General;  Laterality: N/A;   RADIOACTIVE SEED GUIDED AXILLARY SENTINEL LYMPH NODE Right 12/06/2021   Procedure: RADIOACTIVE SEED GUIDED RIGHT AXILLARY SENTINEL LYMPH NODE EXCISION;  Surgeon: Rolm Bookbinder, MD;  Location: [place];  Service: General;  Laterality: Right;   REMOVAL OF BILATERAL TISSUE EXPANDERS WITH PLACEMENT OF BILATERAL BREAST IMPLANTS Bilateral 01/01/2022   Procedure: REMOVAL OF BILATERAL TISSUE EXPANDERS WITH PLACEMENT OF BILATERAL BREAST IMPLANTS;  Surgeon: Wallace Going, DO;  Location: [place];  Service: Plastics;  Laterality: Bilateral;   SENTINEL NODE BIOPSY Right 12/06/2021   Procedure: RIGHT AXILLARY SENTINEL NODE BIOPSY;  Surgeon: Rolm Bookbinder, MD;  Location: [place];  Service: General;  Laterality: Right;   TONSILLECTOMY     and adenoids   TONSILLECTOMY AND ADENOIDECTOMY  TOTAL MASTECTOMY Bilateral 12/06/2021   Procedure: BILATERAL SKIN SPARING TOTAL MASTECTOMY;  Surgeon: Rolm Bookbinder, MD;  Location: [place];  Service: General;  Laterality: Bilateral;   WISDOM TOOTH EXTRACTION     Family History  Problem Relation Age of Onset   Migraines Mother    Prostate cancer Maternal Grandfather        dx after 35   Lymphoma Paternal Aunt        dx late 89s   Colon cancer Neg Hx     Esophageal cancer Neg Hx    Pancreatic cancer Neg Hx    Stomach cancer Neg Hx    Social History   Tobacco Use   Smoking status: Never   Smokeless tobacco: Never  Vaping Use   Vaping Use: Never used  Substance Use Topics   Alcohol use: Not Currently   Drug use: Never   Current Outpatient Medications  Medication Sig Dispense Refill   acetaminophen (TYLENOL) 500 MG tablet Take 1,000 mg by mouth every 6 (six) hours as needed for mild pain or headache.     ALPRAZolam (XANAX) 0.5 MG tablet Take 0.5 mg by mouth 3 (three) times daily as needed for anxiety.     clindamycin (CLINDAGEL) 1 % gel Apply topically 2 (two) times daily. Apply to affect area two times daily 30 g 1   diphenhydrAMINE (BENADRYL) 25 mg capsule Take 25 mg by mouth every 6 (six) hours as needed for itching or allergies.     diphenoxylate-atropine (LOMOTIL) 2.5-0.025 MG tablet Take 1 tablet by mouth 3 (three) times daily as needed for diarrhea or loose stools. 30 tablet 3   Goserelin Acetate (ZOLADEX [place])      hydrocortisone cream 0.5 % Apply 1 Application topically as needed for itching.     Investigational tucatanib/placebo 150 MG tablet (ONT-380,NSC #[id number]) [employer]ANCE [id number] Take 2 tablets (300 mg total) 2 (two) times dailyfor 21 days.  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 Tucatinib or placebo should take orally twice daily with or without food, approximately 12 hours apart between doses. Tablets must be swallowed whole and may not be crushed, chewed or dissolved in liquid. CAUTION: Chemotherapy/Biotherapy 84 tablet 0   letrozole (FEMARA) 2.5 MG tablet Take 1 tablet (2.5 mg total) by mouth daily. 90 tablet 3   lidocaine-prilocaine (EMLA) cream lidocaine-prilocaine 2.5 %-2.5 % topical cream  APPLY TO AFFECTED AREA ONCE AS DIRECTED     loratadine (CLARITIN) 10 MG tablet Take 10 mg by mouth daily as needed for allergies.     LORazepam (ATIVAN) 0.5 MG tablet TAKE 1 TABLET (0.5 MG TOTAL) BY MOUTH AT BEDTIME AS NEEDED (NAUSEA OR VOMITING).      ondansetron (ZOFRAN) 8 MG tablet TAKE 1 TABLET BY MOUTH EVERY 8 HOURS AS NEEDED FOR NAUSEA 30 tablet 3   polyethylene glycol (MIRALAX / GLYCOLAX) 17 g packet Take 17 g by mouth daily as needed for mild constipation.     valACYclovir (VALTREX) 500 MG tablet Take 500 mg by mouth every evening.     venlafaxine XR (EFFEXOR-XR) 37.5 MG 24 hr capsule Take 1 capsule (37.5 mg total) by mouth daily with breakfast. 90 capsule 1   XARELTO 20 MG TABS tablet TAKE 1 TABLET BY MOUTH DAILY WITH SUPPER. 30 tablet 5   No current facility-administered medications for this visit.   Allergies  Allergen Reactions   Wound Dressings Other (See Comments)    Honey comb dressing causes redness and inflammation      Review of Systems:  All systems reviewed and negative except where noted in HPI.    No results found.  Physical Exam: BP 116/72   Pulse 90   Ht '5\' 4"'$  (1.626 m)   Wt 128 lb (58.1 kg)   SpO2 98%   BMI 21.97 kg/m  Constitutional: Pleasant,well-developed, Caucasian female in no acute distress. HEENT: Normocephalic and atraumatic. Conjunctivae are normal. No scleral icterus. Neck supple.  Cardiovascular: Normal rate, regular rhythm.  Pulmonary/chest: Effort normal and breath sounds normal. No wheezing, rales or rhonchi. Abdominal: Soft, nondistended, nontender. Bowel sounds active throughout. There are no masses palpable. No hepatomegaly. Extremities: no edema Neurological: Alert and oriented to person place and time. Skin: Skin is warm and dry. No rashes noted. Psychiatric: Normal mood and affect. Behavior is normal.  CBC    Component Value Date/Time   WBC 6.2 11/01/2022 0849   WBC 8.9 01/24/2022 0040   RBC 3.98 11/01/2022 0849   HGB 13.3 11/01/2022 0849   HCT 37.6 11/01/2022 0849   PLT 195 11/01/2022 0849   MCV 94.5 11/01/2022 0849   MCH 33.4 11/01/2022 0849   MCHC 35.4 11/01/2022 0849   RDW 13.9 11/01/2022 0849   LYMPHSABS 1.4 11/01/2022 0849   MONOABS 0.5 11/01/2022 0849    EOSABS 0.5 11/01/2022 0849   BASOSABS 0.1 11/01/2022 0849    CMP     Component Value Date/Time   NA 141 11/01/2022 0849   NA 140 04/05/2016 0932   K 3.8 11/01/2022 0849   CL 107 11/01/2022 0849   CO2 28 11/01/2022 0849   GLUCOSE 80 11/01/2022 0849   BUN 14 11/01/2022 0849   BUN 12 04/05/2016 0932   CREATININE 0.94 11/01/2022 0849   CALCIUM 9.3 11/01/2022 0849   PROT 7.3 11/01/2022 0849   PROT 7.2 04/05/2016 0932   ALBUMIN 4.0 11/01/2022 0849   ALBUMIN 4.4 04/05/2016 0932   AST 32 11/01/2022 0849   ALT 25 11/01/2022 0849   ALKPHOS 143 (H) 11/01/2022 0849   BILITOT 0.7 11/01/2022 0849   GFRNONAA >60 11/01/2022 0849   GFRAA 109 04/05/2016 0932     ASSESSMENT AND PLAN: 37 year old female with history of breast cancer currently treated with [place] who is interested in early colon cancer screening.  The patient has no known family history of colon cancer.  Genetic testing did not reveal any mutations that increase the risk of colon cancer.  She does not have any concerning symptoms such as hematochezia, change in bowel habits or unintentional weight loss.  I informed the patient that she is considered average risk for colon cancer.  Her history of breast cancer does not increase her risk for colon cancer.  The patient remains anxious about the possibility of colon cancer.  We discussed the approved test for colon cancer screening.  While colonoscopy is invasive and carries some risk (and is also expensive) a stool based test is fairly cheap and easy.  A negative FIT test may relieve some of the patient's anxiety and worry about colon cancer, and I offered this to her.  Patient was agreeable.  If the FIT test is positive, we will proceed with a colonoscopy.  Colon cancer screening - FIT  Azel Gumina E. Candis Schatz, MD [facility]   Nicholas Lose, MD

## 2022-11-14 ENCOUNTER — Other Ambulatory Visit: Payer: Self-pay | Admitting: *Deleted

## 2022-11-14 DIAGNOSIS — C50411 Malignant neoplasm of upper-outer quadrant of right female breast: Secondary | ICD-10-CM

## 2022-11-18 ENCOUNTER — Other Ambulatory Visit: Payer: Self-pay | Admitting: Hematology and Oncology

## 2022-11-22 ENCOUNTER — Ambulatory Visit: Payer: 59 | Admitting: Hematology and Oncology

## 2022-11-22 ENCOUNTER — Ambulatory Visit: Payer: 59

## 2022-11-22 ENCOUNTER — Other Ambulatory Visit: Payer: Self-pay | Admitting: *Deleted

## 2022-11-22 ENCOUNTER — Other Ambulatory Visit: Payer: 59

## 2022-11-22 DIAGNOSIS — Z170 Estrogen receptor positive status [ER+]: Secondary | ICD-10-CM

## 2022-11-22 NOTE — Progress Notes (Signed)
Patient Care Team: Dian Queen, MD as PCP - General (Obstetrics and Gynecology) Dian Queen, MD (Obstetrics and Gynecology) Mauro Kaufmann, RN as Oncology Nurse Navigator Carlynn Spry, Charlott Holler, RN as Oncology Nurse Navigator Nicholas Lose, MD as Consulting Physician (Hematology and Oncology) Gery Pray, MD as Consulting Physician (Radiation Oncology) Rolm Bookbinder, MD as Consulting Physician (General Surgery)  DIAGNOSIS:  Encounter Diagnosis  Name Primary?   Malignant neoplasm of upper-outer quadrant of right breast in female, estrogen receptor positive ([place])     SUMMARY OF ONCOLOGIC HISTORY: Oncology History  Malignant neoplasm of upper-outer quadrant of right breast in female, estrogen receptor positive ([place])  06/23/2021 Initial Diagnosis   Palpable lump in the right breast. Diagnostic mammogram and [place]: a 3.8 cm mass at the retroareolar right breast, 1 prominent right axillary lymph node with a 4 mm cortex, 1 other which is borderline measuring 3 mm. 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  3 o'clock position: Biopsy fibroadenoma   07/12/2021 -  Neo-Adjuvant Chemotherapy   Neoadjuvant chemotherapy with TCH Perjeta 6 cycles followed by Herceptin Perjeta maintenance versus Kadcyla maintenance (based on response to neoadjuvant chemo) for 1 year   07/28/2021 Genetic Testing   Negative hereditary cancer genetic testing: no pathogenic variants detected in [facility] CancerNext-Expanded +RNAinsight Panel.  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  The report date is July 28, 2021.   The CancerNext-Expanded gene panel offered by [facility] and includes sequencing, rearrangement, and RNA analysis for the following 77 genes: AIP, ALK, APC, ATM, AXIN2, BAP1, BARD1, BLM, BMPR1A, BRCA1, BRCA2, BRIP1, CDC73, CDH1, CDK4, CDKN1B, CDKN2A, CHEK2, CTNNA1, DICER1, FANCC, FH,  FLCN, GALNT12, KIF1B, LZTR1, MAX, MEN1, MET, MLH1, MSH2, MSH3, MSH6, MUTYH, NBN, NF1, NF2, NTHL1, PALB2, PHOX2B, PMS2, POT1, PRKAR1A, PTCH1, PTEN, RAD51C, RAD51D, RB1, RECQL, RET, SDHA, SDHAF2, SDHB, SDHC, SDHD, SMAD4, SMARCA4, SMARCB1, SMARCE1, STK11, SUFU, TMEM127, TP53, TSC1, TSC2, VHL and XRCC2 (sequencing and deletion/duplication); EGFR, EGLN1, HOXB13, KIT, MITF, PDGFRA, POLD1, and POLE (sequencing only); EPCAM and GREM1 (deletion/duplication only).    12/06/2021 Surgery   Bilateral Mastectomies: Left mastectomy: Benign Right mastectomy: Multifocal ILC 1.8 cm, 0.9 cm, margins negative, 1/2 lymph nodes positive, ER 50%, PR 30%, HER2 1+ IHC   12/26/2021 - 12/29/2021 Chemotherapy   Patient is on Treatment Plan : BREAST ADO-Trastuzumab Emtansine (Kadcyla) q21d       CHIEF COMPLIANT: Follow-up of right breast cancer on Cycle 14 Kadcyla.   INTERVAL HISTORY: Jenna Cross is a 37 y.o with the above- mentioned right breast cancer. Currently on treatment with Kadcyla. She presents to the clinic today for treatment and a follow-up. She reports that the fingers has healed and rash has subsided. She states that the cough has subsided. She complains of  back pain she believes it is muscular from coughing. She says the neuropathy is not as bad in the finger tips. She is able to button her shirt.   ALLERGIES:  is allergic to wound dressings.  MEDICATIONS:  Current Outpatient Medications  Medication Sig Dispense Refill   acetaminophen (TYLENOL) 500 MG tablet Take 1,000 mg by mouth every 6 (six) hours as needed for mild pain or headache.     ALPRAZolam (XANAX) 0.5 MG tablet Take 0.5 mg by mouth 3 (three) times daily as needed for anxiety.     clindamycin (CLINDAGEL) 1 % gel Apply topically 2 (two) times daily. Apply to affect area two times daily 30 g 1  diphenhydrAMINE (BENADRYL) 25 mg capsule Take 25 mg by mouth every 6 (six) hours as needed for itching or allergies.     diphenoxylate-atropine  (LOMOTIL) 2.5-0.025 MG tablet Take 1 tablet by mouth 3 (three) times daily as needed for diarrhea or loose stools. 30 tablet 3   Goserelin Acetate (ZOLADEX [place])      hydrocortisone cream 0.5 % Apply 1 Application topically as needed for itching.     letrozole (FEMARA) 2.5 MG tablet Take 1 tablet (2.5 mg total) by mouth daily. 90 tablet 3   lidocaine-prilocaine (EMLA) cream lidocaine-prilocaine 2.5 %-2.5 % topical cream  APPLY TO AFFECTED AREA ONCE AS DIRECTED     loratadine (CLARITIN) 10 MG tablet Take 10 mg by mouth daily as needed for allergies.     LORazepam (ATIVAN) 0.5 MG tablet TAKE 1 TABLET (0.5 MG TOTAL) BY MOUTH AT BEDTIME AS NEEDED (NAUSEA OR VOMITING).     ondansetron (ZOFRAN) 8 MG tablet TAKE 1 TABLET BY MOUTH EVERY 8 HOURS AS NEEDED FOR NAUSEA 30 tablet 3   polyethylene glycol (MIRALAX / GLYCOLAX) 17 g packet Take 17 g by mouth daily as needed for mild constipation.     valACYclovir (VALTREX) 500 MG tablet Take 500 mg by mouth every evening.     venlafaxine XR (EFFEXOR-XR) 37.5 MG 24 hr capsule Take 1 capsule (37.5 mg total) by mouth daily with breakfast. 90 capsule 1   XARELTO 20 MG TABS tablet TAKE 1 TABLET BY MOUTH DAILY WITH SUPPER 30 tablet 5   No current facility-administered medications for this visit.    PHYSICAL EXAMINATION: ECOG PERFORMANCE STATUS: 1 - Symptomatic but completely ambulatory  Vitals:   11/23/22 0847  BP: 129/66  Pulse: 95  Resp: 16  Temp: 97.7 F (36.5 C)  SpO2: 100%   Filed Weights   11/23/22 0847  Weight: 134 lb 12.8 oz (61.1 kg)      LABORATORY DATA:  I have reviewed the data as listed    Latest Ref Rng & Units 11/01/2022    8:49 AM 10/09/2022    1:34 PM 09/13/2022    9:01 AM  CMP  Glucose 70 - 99 mg/dL 80  87  97   BUN 6 - 20 mg/dL 14  10  9   $ Creatinine 0.44 - 1.00 mg/dL 0.94  0.79  0.81   Sodium 135 - 145 mmol/L 141  140  141   Potassium 3.5 - 5.1 mmol/L 3.8  3.4  3.9   Chloride 98 - 111 mmol/L 107  104  106   CO2 22 - 32 mmol/L  28  29  29   $ Calcium 8.9 - 10.3 mg/dL 9.3  9.6  9.7   Total Protein 6.5 - 8.1 g/dL 7.3  7.4  7.3   Total Bilirubin 0.3 - 1.2 mg/dL 0.7  0.9  0.6   Alkaline Phos 38 - 126 U/L 143  102  110   AST 15 - 41 U/L 32  27  28   ALT 0 - 44 U/L 25  21  21     $ Lab Results  Component Value Date   WBC 6.3 11/23/2022   HGB 13.0 11/23/2022   HCT 37.3 11/23/2022   MCV 93.3 11/23/2022   PLT 193 11/23/2022   NEUTROABS 4.1 11/23/2022    ASSESSMENT & PLAN:  Malignant neoplasm of upper-outer quadrant of right breast in female, estrogen receptor positive ([facility]) Palpable lump in the right breast. Diagnostic mammogram and [place]: a 3.8 cm mass  at the retroareolar right breast, 1 prominent right axillary lymph node with a 4 mm cortex, 1 other which is borderline measuring 3 mm. 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  3 o'clock position: Biopsy fibroadenoma   Treatment plan: 1. Neoadjuvant chemotherapy with TCH Perjeta 6 cycles followed by Herceptin Perjeta maintenance versus Kadcyla maintenance (based on response to neoadjuvant chemo) for 1 year 2. Bilateral Mastectomies: Left mastectomy: Benign Right mastectomy: Multifocal ILC 1.8 cm, 0.9 cm, margins negative, 1/2 lymph nodes positive, ER 50%, PR 30%, HER2 1+ IHC 3.  Compass HER2 clinical trial: Randomization between Kadcyla versus Kadcyla plus Tucatinib 4. Followed by adjuvant radiation therapy  5.  Followed by antiestrogen therapy   ------------------------------------------------------------------------------------------------------------------------ Current treatment: Kadcyla cycle 14 maintenance (last cycle) (Compass HER2 clinical trial) + Tucatinib vs Placebo (last tucatinib 12/13/22) Zoladex with letrozole Echocardiogram 09/03/2022: EF 50-55%   Hospitalization 01/22/2022-01/26/2022 abscess/cellulitis required removal of the implant The plan is now a delayed reconstruction    Adverse Effects: Tolerating it well 1. Mild diarrhea: No longer an issue 2. mild nausea: Resolved 3. Nail bed changes: Improving  Continue monthly Zoladex injections for ovarian function suppression  She has seen Dr. Veda Canning plastic surgery who plans to do a latissimus dorsi flap reconstruction after Kadcyla is completed. She plans to do oophorectomy once she recovers from her breast reconstruction.  Signatera testing has been ordered.  Return to clinic in August for her next follow-up.  No orders of the defined types were placed in this encounter.  The patient has a good understanding of the overall plan. she agrees with it. she will call with any problems that may develop before the next visit here. Total time spent: 30 mins including face to face time and time spent for planning, charting and co-ordination of care   Harriette Ohara, MD 11/23/22    I Gardiner Coins am acting as a Education administrator for [employer]  I have reviewed the above documentation for accuracy and completeness, and I agree with the above.

## 2022-11-23 ENCOUNTER — Inpatient Hospital Stay: Payer: 59 | Admitting: Hematology and Oncology

## 2022-11-23 ENCOUNTER — Other Ambulatory Visit: Payer: Self-pay | Admitting: *Deleted

## 2022-11-23 ENCOUNTER — Telehealth: Payer: Self-pay

## 2022-11-23 ENCOUNTER — Other Ambulatory Visit: Payer: Self-pay

## 2022-11-23 ENCOUNTER — Inpatient Hospital Stay: Payer: 59

## 2022-11-23 ENCOUNTER — Inpatient Hospital Stay: Payer: 59 | Attending: Hematology and Oncology

## 2022-11-23 ENCOUNTER — Encounter: Payer: Self-pay | Admitting: *Deleted

## 2022-11-23 VITALS — BP 144/73 | HR 98 | Resp 18

## 2022-11-23 DIAGNOSIS — C50411 Malignant neoplasm of upper-outer quadrant of right female breast: Secondary | ICD-10-CM | POA: Insufficient documentation

## 2022-11-23 DIAGNOSIS — Z79811 Long term (current) use of aromatase inhibitors: Secondary | ICD-10-CM | POA: Insufficient documentation

## 2022-11-23 DIAGNOSIS — Z006 Encounter for examination for normal comparison and control in clinical research program: Secondary | ICD-10-CM

## 2022-11-23 DIAGNOSIS — Z9013 Acquired absence of bilateral breasts and nipples: Secondary | ICD-10-CM | POA: Diagnosis not present

## 2022-11-23 DIAGNOSIS — Z5181 Encounter for therapeutic drug level monitoring: Secondary | ICD-10-CM

## 2022-11-23 DIAGNOSIS — Z170 Estrogen receptor positive status [ER+]: Secondary | ICD-10-CM

## 2022-11-23 DIAGNOSIS — Z5111 Encounter for antineoplastic chemotherapy: Secondary | ICD-10-CM | POA: Insufficient documentation

## 2022-11-23 DIAGNOSIS — Z95828 Presence of other vascular implants and grafts: Secondary | ICD-10-CM

## 2022-11-23 LAB — CBC WITH DIFFERENTIAL (CANCER CENTER ONLY)
Abs Immature Granulocytes: 0.03 10*3/uL (ref 0.00–0.07)
Basophils Absolute: 0.1 10*3/uL (ref 0.0–0.1)
Basophils Relative: 1 %
Eosinophils Absolute: 0.3 10*3/uL (ref 0.0–0.5)
Eosinophils Relative: 4 %
HCT: 37.3 % (ref 36.0–46.0)
Hemoglobin: 13 g/dL (ref 12.0–15.0)
Immature Granulocytes: 1 %
Lymphocytes Relative: 22 %
Lymphs Abs: 1.4 10*3/uL (ref 0.7–4.0)
MCH: 32.5 pg (ref 26.0–34.0)
MCHC: 34.9 g/dL (ref 30.0–36.0)
MCV: 93.3 fL (ref 80.0–100.0)
Monocytes Absolute: 0.5 10*3/uL (ref 0.1–1.0)
Monocytes Relative: 7 %
Neutro Abs: 4.1 10*3/uL (ref 1.7–7.7)
Neutrophils Relative %: 65 %
Platelet Count: 193 10*3/uL (ref 150–400)
RBC: 4 MIL/uL (ref 3.87–5.11)
RDW: 13.4 % (ref 11.5–15.5)
WBC Count: 6.3 10*3/uL (ref 4.0–10.5)
nRBC: 0 % (ref 0.0–0.2)

## 2022-11-23 LAB — CMP (CANCER CENTER ONLY)
ALT: 23 U/L (ref 0–44)
AST: 26 U/L (ref 15–41)
Albumin: 4 g/dL (ref 3.5–5.0)
Alkaline Phosphatase: 152 U/L — ABNORMAL HIGH (ref 38–126)
Anion gap: 7 (ref 5–15)
BUN: 12 mg/dL (ref 6–20)
CO2: 27 mmol/L (ref 22–32)
Calcium: 9.2 mg/dL (ref 8.9–10.3)
Chloride: 106 mmol/L (ref 98–111)
Creatinine: 0.88 mg/dL (ref 0.44–1.00)
GFR, Estimated: >60 mL/min (ref >60)
Glucose, Bld: 92 mg/dL (ref 70–99)
Potassium: 3.5 mmol/L (ref 3.5–5.1)
Sodium: 140 mmol/L (ref 135–145)
Total Bilirubin: 0.4 mg/dL (ref 0.3–1.2)
Total Protein: 7 g/dL (ref 6.5–8.1)

## 2022-11-23 LAB — PREGNANCY, URINE: Preg Test, Ur: NEGATIVE

## 2022-11-23 MED ORDER — INV-TUCATINIB/PLACEBO 150 MG TABLET ALLIANCE A011801: 300.0000 mg | ORAL_TABLET | Freq: Two times a day (BID) | ORAL | 0 refills | Status: AC

## 2022-11-23 MED ORDER — ONDANSETRON HCL 4 MG/2ML IJ SOLN
8.0000 mg | Freq: Once | INTRAMUSCULAR | Status: AC
  Administered 2022-11-23: 8 mg via INTRAVENOUS
  Filled 2022-11-23: qty 4

## 2022-11-23 MED ORDER — ACETAMINOPHEN 325 MG PO TABS
650.0000 mg | ORAL_TABLET | Freq: Once | ORAL | Status: AC
  Administered 2022-11-23: 650 mg via ORAL
  Filled 2022-11-23: qty 2

## 2022-11-23 MED ORDER — SODIUM CHLORIDE 0.9 % IV SOLN: Freq: Once | INTRAVENOUS | Status: AC

## 2022-11-23 MED ORDER — SODIUM CHLORIDE 0.9% FLUSH
10.0000 mL | INTRAVENOUS | Status: DC | PRN
  Administered 2022-11-23: 10 mL

## 2022-11-23 MED ORDER — SODIUM CHLORIDE 0.9% FLUSH
10.0000 mL | Freq: Once | INTRAVENOUS | Status: AC
  Administered 2022-11-23: 10 mL

## 2022-11-23 MED ORDER — DIPHENHYDRAMINE HCL 25 MG PO CAPS
50.0000 mg | ORAL_CAPSULE | Freq: Once | ORAL | Status: AC
  Administered 2022-11-23: 50 mg via ORAL
  Filled 2022-11-23: qty 2

## 2022-11-23 MED ORDER — HEPARIN SOD (PORK) LOCK FLUSH 100 UNIT/ML IV SOLN
500.0000 [IU] | Freq: Once | INTRAVENOUS | Status: AC | PRN
  Administered 2022-11-23: 500 [IU]

## 2022-11-23 MED ORDER — SODIUM CHLORIDE 0.9 % IV SOLN
3.6000 mg/kg | Freq: Once | INTRAVENOUS | Status: AC
  Administered 2022-11-23: 200 mg via INTRAVENOUS
  Filled 2022-11-23: qty 10

## 2022-11-23 NOTE — Telephone Encounter (Signed)
Per md orders entered for signatera. Requisition and all supported documents faxed to [phone]. Faxed confirmation was received.  

## 2022-11-23 NOTE — Progress Notes (Signed)
Per Lindi Adie MD, ok to treat with ECHO from 09/03/22.

## 2022-11-23 NOTE — Research (Unsigned)
CompassHER2 Residual Disease, A Double-Blinded, Phase III Randomized Trial of T-DM1 and Placebo Compared with T-DMI and Tucatinib  Patient presented to clinic unaccompanied this morning for cycle 14 day 1.    ePROs: Not required at this time point.   LABS: CBC and CMP collected via port-a-cath per consent and study protocol. Patient tolerated well without complaint. Urine pregnancy collected per institutional guidelines.   VITAL SIGNS: Completed, including weight. See vital signs flowsheet.   H&P: Completed by Dr Lindi Adie. See clinic note dated today. Provider reviewed lab results and adverse events and cleared patient to continue treatment.   RESEARCH: Concomitant medications reviewed; patient noted no changes since last visit.  Adverse Events:  Solicited and other adverse events assessed/reported per protocol.  Patient reported the following; Mild anorexia (grade 1) for three days following her last T-DM1 infusion.   She has resolution of mild maculo-papular rash (grade 1) about 2 weeks ago. Bilateral hands appear clear without any red, flaky skin. She denies any new rashes.  Nail (cuticle) infection (grade 1) is also resolved and patient reports this has been better for about 1 week.     Moderate disturbed sleep/insomnia (grade 2) intermittent ongoing.  Ongoing fatigue (grade 1 -- baseline). Mild nausea (grade 1) for 3 days after her last T-DMI infusion. She denies vomiting and states she took zofran once or twice for the nausea which helped. Dr. Lindi Adie states this is possibly related to T-DMI and/or Tucatinib/Placebo.  Cough (grade 1) has resolved about 1 week ago.   Today's lab showed ongoing alkaline phosphatase increase (grade 1) which Dr. Lindi Adie states is not clinically significant and unlikely related to T-DMI and/or Tucatinib/Placebo. New right sided upper back pain (grade 1) for 1 week when she coughs, sneezes or moves in a certain way. It is not persistent, does not interfere with  any activities and she has not taken any medication for this. Dr. Lindi Adie states it is likely musculoskeletal pain and unrelated to study treatment.  Reports mild tingling in a few of her fingertips on bilateral hands, Peripheral sensory neuropathy (grade1).  States it is barely noticeable and does not interfere with her ability to button her clothes or any other activities. She denied any neuropathy symptoms at last visit but now thinks it did start after cycle 12 but wasn't very noticeable at that time. She says it is intermittent and has noticed it a little more these past few weeks since cycle 13. Dr. Lindi Adie states it is possibly related to study treatment.  See AE table below.  Study Drug:  Patient returned remaining cycle 13 tucatinib/placebo and medication diary. She returned two bottles, one bottle with 2 pills remaining and one unopened bottle with 50 tablets remaining (2 + 50 = 52; 100 total tablets dispensed - 52 remaining =  48 tablets taken).  Medication diary reflects the same: 48 tablets taken; 24 total doses.  Remaining cycle 13 tucatinib/placebo bottles returned to Romualdo Bolk, Pharmacist.    Cycle 14 tucatinib/placebo dispensed by Romualdo Bolk, Pharmacist. Research RN verified that drug had correct patient name and study ID number. Drug was dispensed in two bottles of 50 188m tablets, lot #[id number] exp date 06/08/2023.  Patient instructed to continue taking two tablets (3085mtotal) orally twice daily and to continue recording doses and side effects on medication diary. Reinforced importance of compliance with study medication. Patient verbalized understanding. Patient was given cycle 14 medication diary.   Patient took Cycle 14, Day 1 study drug, 2 -150 mg  tablets of tucatinib/placebo, in clinic at 10:00 am and it was documented in the cycle 14 medication diary.   INFUSION: Patient received clinic standard pre-medications for T-DM1 treatment -- 664m acetaminophen PO and 574m diphenhydramine PO. PKs not required this cycle per protocol. Patient tolerated infusion well without complaint.   ADVERSE EVENT LOG: Study/Protocol: [facility] [id number] Assessed Cycle 14, Day 1   Baseline: anxiety (grade 2), fatigue (grade 1), herpes simplex virus, hypotension (grade 1), vascular access complication (grade 3). Not reportable (unrelated/unlikely to be related to study treatment and < grade 3): breast pain (grade 1), bruising (grade 1), constipation (grade 1), muscle weakness -- upper limb (grade 1).  Event (solicited vs other reportable) Grade CTCAE v5.0 Onset Date Resolved Date Attribution to Study Treatment Treatment Comments  Neutrophil count decreased 0 N/A N/A N/A N/A   Platelet count decreased 0 N/A N/A N/A N/A   Rash (maculo-papular) 1 09/29/22 11/09/22 Probable Hydrocortisone cream & benadryl Patient noted rash on BIL hands and wrists improved but not completely resolved as previously reported.   Epistaxis 0 N/A N/A N/A N/A    Diarrhea 0 N/A N/A N/A N/A Patient reported a baseline of 2-3 stools/day.   Nausea 1 11/02/22 11/04/22 Possible Ondansetron PRN   Vomiting 0 N/A N/A N/A N/A   Bilirubin increased 0 N/A N/A N/A N/A    AST increased 0 N/A N/A N/A N/A    ALT increased 0 N/A N/A N/A N/A    Anemia 0 N/A N/A N/A N/A    Mucositis oral 0 N/A N/A N/A N/A    Palmar-plantar erythrodysesthesia 0 N/A N/A N/A N/A    Peripheral neuropathy -- motor 0 N/A N/A N/A N/A    Peripheral neuropathy -- sensory 1 10/10/2022 Ongoing Possible None Mild, intermittent  Anorexia 1 11/02/22 11/04/22 Probable N/A Patient reported loss of appetite x3 days after T-DM1 infusion.  Insomnia 2 06/22/22 Ongoing Probable Adjustments to medication schedule Patient reported moderate disturbed sleep.  Nail infection 1 08/28/22 11/09/22 Probable Clindamycin gel + hydrocortisone cream PRN Patient reported that her nails/cuticles inflammation is improving  Cough 1 10/10/22 11/16/22 Unrelated none    Increased Alkaline Phosphatase 1 11/01/22 Ongoing Unlikely none   Back Pain 1 11/16/22 Ongoing Unrelated none Intermittent with coughing or sneezing   PLAN: Patient is scheduled for Port a Cath removal on 12/03/22 which will be day 11 of this cycle.  Dr. GuLindi Adietates patient does not need to hold study drug for procedure. She can continue as prescribed.  Dr. GuLindi Adieid instruct patient to stop Xarelto 48 hrs prior to procedure and no need to resume once PAC is removed.   Next study visit is scheduled for 01/09/23 for patient to meet with research nurse to return unused study drug, medication diary and have AE assessment within 30 days of completing tucatinib/placebo. Echocardiogram is also scheduled for same day along with lab appointment to collect optional research specimens.  Patient verbalized understanding of next appointments.  Patient reminded to bring remaining tucatinib/placebo tablets and cycle 14 medication diary to this appointment. Patient verbalized understanding. Patient has been provided direct contact information and is encouraged to contact this nurse for any questions or concerns.   CaFoye SpurlingBSN, RN, CCYorktownurse II [phone]/16/2024

## 2022-11-23 NOTE — Patient Instructions (Signed)
[facility]  Discharge Instructions: Thank you for choosing [facility] to provide your oncology and hematology care.   If you have a lab appointment with the Cancer Center, please go directly to the Cancer Center and check in at the registration area.   Wear comfortable clothing and clothing appropriate for easy access to any Portacath or PICC line.   We strive to give you quality time with your provider. You may need to reschedule your appointment if you arrive late (15 or more minutes).  Arriving late affects you and other patients whose appointments are after yours.  Also, if you miss three or more appointments without notifying the office, you may be dismissed from the clinic at the provider's discretion.      For prescription refill requests, have your pharmacy contact our office and allow 72 hours for refills to be completed.    Today you received the following chemotherapy and/or immunotherapy agents Kadcyla      To help prevent nausea and vomiting after your treatment, we encourage you to take your nausea medication as directed.  BELOW ARE SYMPTOMS THAT SHOULD BE REPORTED IMMEDIATELY: *FEVER GREATER THAN 100.4 F (38 C) OR HIGHER *CHILLS OR SWEATING *NAUSEA AND VOMITING THAT IS NOT CONTROLLED WITH YOUR NAUSEA MEDICATION *UNUSUAL SHORTNESS OF BREATH *UNUSUAL BRUISING OR BLEEDING *URINARY PROBLEMS (pain or burning when urinating, or frequent urination) *BOWEL PROBLEMS (unusual diarrhea, constipation, pain near the anus) TENDERNESS IN MOUTH AND THROAT WITH OR WITHOUT PRESENCE OF ULCERS (sore throat, sores in mouth, or a toothache) UNUSUAL RASH, SWELLING OR PAIN  UNUSUAL VAGINAL DISCHARGE OR ITCHING   Items with * indicate a potential emergency and should be followed up as soon as possible or go to the Emergency Department if any problems should occur.  Please show the CHEMOTHERAPY ALERT CARD or IMMUNOTHERAPY ALERT CARD at check-in  to the Emergency Department and triage nurse.  Should you have questions after your visit or need to cancel or reschedule your appointment, please contact [facility]  Dept: [phone]  and follow the prompts.  Office hours are 8:00 a.m. to 4:30 p.m. Monday - Friday. Please note that voicemails left after 4:00 p.m. may not be returned until the following business day.  We are closed weekends and major holidays. You have access to a nurse at all times for urgent questions. Please call the main number to the clinic Dept: [phone] and follow the prompts.   For any non-urgent questions, you may also contact your provider using MyChart. We now offer e-Visits for anyone 18 and older to request care online for non-urgent symptoms. For details visit [email or web address].   Also download the MyChart app! Go to the app store, search "MyChart", open the app, select [facility], and log in with your MyChart username and password.  

## 2022-11-23 NOTE — Assessment & Plan Note (Signed)
Palpable lump in the right breast. Diagnostic mammogram and [place]: a 3.8 cm mass at the retroareolar right breast, 1 prominent right axillary lymph node with a 4 mm cortex, 1 other which is borderline measuring 3 mm. 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  3 o'clock position: Biopsy fibroadenoma   Treatment plan: 1. Neoadjuvant chemotherapy with TCH Perjeta 6 cycles followed by Herceptin Perjeta maintenance versus Kadcyla maintenance (based on response to neoadjuvant chemo) for 1 year 2. Bilateral Mastectomies: Left mastectomy: Benign Right mastectomy: Multifocal ILC 1.8 cm, 0.9 cm, margins negative, 1/2 lymph nodes positive, ER 50%, PR 30%, HER2 1+ IHC 3.  Compass HER2 clinical trial: Randomization between Kadcyla versus Kadcyla plus Tucatinib 4. Followed by adjuvant radiation therapy  5.  Followed by antiestrogen therapy   ------------------------------------------------------------------------------------------------------------------------ Current treatment: Kadcyla cycle 14 maintenance (last cycle) (Compass HER2 clinical trial) + Tucatinib vs Placebo Echocardiogram March 2023: EF 55 to 60%   Hospitalization 01/22/2022-01/26/2022 abscess/cellulitis required removal of the implant The plan is now a delayed reconstruction   Adverse Effects: Tolerating it well 1. Mild diarrhea: No longer an issue 2. mild nausea: Resolved Continue monthly Zoladex injections for ovarian function suppression  She has seen Dr. Veda Canning plastic surgery who plans to do a latissimus dorsi flap reconstruction after Kadcyla is completed.   We discussed Signatera testing after completion of Kadcyla.

## 2022-11-26 ENCOUNTER — Encounter: Payer: Self-pay | Admitting: Hematology and Oncology

## 2022-11-26 ENCOUNTER — Telehealth: Payer: Self-pay | Admitting: Hematology and Oncology

## 2022-11-26 ENCOUNTER — Encounter: Payer: Self-pay | Admitting: *Deleted

## 2022-11-26 ENCOUNTER — Other Ambulatory Visit: Payer: Self-pay

## 2022-11-26 DIAGNOSIS — Z170 Estrogen receptor positive status [ER+]: Secondary | ICD-10-CM

## 2022-11-26 NOTE — Telephone Encounter (Signed)
Scheduled appointments per 2/16 los. Patient is aware of the made appointments.

## 2022-11-27 ENCOUNTER — Encounter (HOSPITAL_BASED_OUTPATIENT_CLINIC_OR_DEPARTMENT_OTHER): Payer: Self-pay | Admitting: General Surgery

## 2022-11-27 ENCOUNTER — Other Ambulatory Visit: Payer: Self-pay

## 2022-11-27 NOTE — Progress Notes (Signed)
   11/27/22 [id number]  PAT Phone Screen  Is the patient taking a GLP-1 receptor agonist? No  Do You Have Diabetes? No  Do You Have Hypertension? No  Have You Ever Been to the ER for Asthma? No  Have You Taken Oral Steroids in the Past 3 Months? No  Do you Take Phenteramine or any Other Diet Drugs? No  Recent  Lab Work, EKG, CXR? Yes  Where was this test performed? [facility] 11/23/22 EKG 01/22/22  Do you have a history of heart problems? No  Have you ever had tests on your heart? Yes  What cardiac tests were performed? Echo  What date/year were cardiac tests completed? 09/03/22 EF 50-55%  Any Recent Hospitalizations? No  Height 5' 4"$  (1.626 m)  Weight 61.1 kg  Pat Appointment Scheduled No  Reason for No Appointment Not Needed   Pre op call completed, Per pt her last dose of Xarelto Friday 2/23 according to  Dr Geralyn Flash instruction.

## 2022-11-29 ENCOUNTER — Encounter: Payer: Self-pay | Admitting: *Deleted

## 2022-11-30 NOTE — Progress Notes (Signed)
       Patient Instructions  The night before surgery:  No food after midnight. ONLY clear liquids after midnight  The day of surgery (if you do NOT have diabetes):  Drink ONE (1) Pre-Surgery Clear Ensure as directed.   This drink was given to you during your hospital  pre-op appointment visit. The pre-op nurse will instruct you on the time to drink the  Pre-Surgery Ensure depending on your surgery time. Finish the drink at the designated time by the pre-op nurse.  Nothing else to drink after completing the  Pre-Surgery Clear Ensure.  The day of surgery (if you have diabetes): Drink ONE (1) Gatorade 2 (G2) as directed. This drink was given to you during your hospital  pre-op appointment visit.  The pre-op nurse will instruct you on the time to drink the   Gatorade 2 (G2) depending on your surgery time. Color of the Gatorade may vary. Red is not allowed. Nothing else to drink after completing the  Gatorade 2 (G2).         If you have questions, please contact your surgeon's office.Gave patient CHG soap with instructions, patient verbalized understanding.

## 2022-12-03 ENCOUNTER — Ambulatory Visit (HOSPITAL_BASED_OUTPATIENT_CLINIC_OR_DEPARTMENT_OTHER): Payer: 59 | Admitting: Certified Registered"

## 2022-12-03 ENCOUNTER — Encounter (HOSPITAL_BASED_OUTPATIENT_CLINIC_OR_DEPARTMENT_OTHER): Payer: Self-pay | Admitting: General Surgery

## 2022-12-03 ENCOUNTER — Ambulatory Visit (HOSPITAL_BASED_OUTPATIENT_CLINIC_OR_DEPARTMENT_OTHER)
Admission: RE | Admit: 2022-12-03 | Discharge: 2022-12-03 | Disposition: A | Discharge status: Home / Self Care | Payer: 59 | Attending: General Surgery | Admitting: General Surgery

## 2022-12-03 ENCOUNTER — Encounter (HOSPITAL_BASED_OUTPATIENT_CLINIC_OR_DEPARTMENT_OTHER)
Admission: RE | Disposition: A | Discharge status: Home / Self Care | Payer: Self-pay | Source: Home / Self Care | Attending: General Surgery

## 2022-12-03 ENCOUNTER — Other Ambulatory Visit: Payer: Self-pay

## 2022-12-03 DIAGNOSIS — C50919 Malignant neoplasm of unspecified site of unspecified female breast: Secondary | ICD-10-CM

## 2022-12-03 DIAGNOSIS — Z452 Encounter for adjustment and management of vascular access device: Secondary | ICD-10-CM | POA: Diagnosis not present

## 2022-12-03 DIAGNOSIS — F419 Anxiety disorder, unspecified: Secondary | ICD-10-CM

## 2022-12-03 DIAGNOSIS — Z01818 Encounter for other preprocedural examination: Secondary | ICD-10-CM

## 2022-12-03 HISTORY — PX: PORT-A-CATH REMOVAL: SHX5289

## 2022-12-03 LAB — POCT PREGNANCY, URINE: Preg Test, Ur: NEGATIVE

## 2022-12-03 SURGERY — REMOVAL PORT-A-CATH
Anesthesia: General | Laterality: Left

## 2022-12-03 MED ORDER — OXYCODONE HCL 5 MG PO TABS: 5.0000 mg | ORAL_TABLET | Freq: Once | ORAL | Status: DC | PRN

## 2022-12-03 MED ORDER — LIDOCAINE HCL (CARDIAC) PF 100 MG/5ML IV SOSY
PREFILLED_SYRINGE | INTRAVENOUS | Status: DC | PRN
  Administered 2022-12-03: 50 mg via INTRAVENOUS

## 2022-12-03 MED ORDER — PROMETHAZINE HCL 25 MG/ML IJ SOLN: 6.2500 mg | INTRAMUSCULAR | Status: DC | PRN

## 2022-12-03 MED ORDER — MIDAZOLAM HCL 5 MG/5ML IJ SOLN
INTRAMUSCULAR | Status: DC | PRN
  Administered 2022-12-03: 2 mg via INTRAVENOUS

## 2022-12-03 MED ORDER — HYDROMORPHONE HCL 1 MG/ML IJ SOLN: 0.2500 mg | INTRAMUSCULAR | Status: DC | PRN

## 2022-12-03 MED ORDER — ONDANSETRON HCL 4 MG/2ML IJ SOLN
INTRAMUSCULAR | Status: DC | PRN
  Administered 2022-12-03: 4 mg via INTRAVENOUS

## 2022-12-03 MED ORDER — AMISULPRIDE (ANTIEMETIC) 5 MG/2ML IV SOLN: 10.0000 mg | Freq: Once | INTRAVENOUS | Status: DC | PRN

## 2022-12-03 MED ORDER — FENTANYL CITRATE (PF) 100 MCG/2ML IJ SOLN
INTRAMUSCULAR | Status: DC | PRN
  Administered 2022-12-03 (×2): 50 ug via INTRAVENOUS

## 2022-12-03 MED ORDER — CEFAZOLIN SODIUM-DEXTROSE 2-4 GM/100ML-% IV SOLN
2.0000 g | INTRAVENOUS | Status: AC
  Administered 2022-12-03: 2 g via INTRAVENOUS

## 2022-12-03 MED ORDER — LACTATED RINGERS IV SOLN: INTRAVENOUS | Status: DC

## 2022-12-03 MED ORDER — CHLORHEXIDINE GLUCONATE CLOTH 2 % EX PADS: 6.0000 | MEDICATED_PAD | Freq: Once | CUTANEOUS | Status: DC

## 2022-12-03 MED ORDER — ACETAMINOPHEN 500 MG PO TABS
1000.0000 mg | ORAL_TABLET | ORAL | Status: AC
  Administered 2022-12-03: 1000 mg via ORAL

## 2022-12-03 MED ORDER — BUPIVACAINE HCL (PF) 0.25 % IJ SOLN
INTRAMUSCULAR | Status: AC
  Filled 2022-12-03: qty 90

## 2022-12-03 MED ORDER — ONDANSETRON HCL 4 MG/2ML IJ SOLN
INTRAMUSCULAR | Status: AC
  Filled 2022-12-03: qty 2

## 2022-12-03 MED ORDER — BUPIVACAINE HCL (PF) 0.25 % IJ SOLN
INTRAMUSCULAR | Status: DC | PRN
  Administered 2022-12-03: 7 mL

## 2022-12-03 MED ORDER — PROPOFOL 10 MG/ML IV BOLUS
INTRAVENOUS | Status: AC
  Filled 2022-12-03: qty 20

## 2022-12-03 MED ORDER — OXYCODONE HCL 5 MG/5ML PO SOLN: 5.0000 mg | Freq: Once | ORAL | Status: DC | PRN

## 2022-12-03 MED ORDER — MIDAZOLAM HCL 2 MG/2ML IJ SOLN
INTRAMUSCULAR | Status: AC
  Filled 2022-12-03: qty 2

## 2022-12-03 MED ORDER — PROPOFOL 10 MG/ML IV BOLUS
INTRAVENOUS | Status: DC | PRN
  Administered 2022-12-03: 20 mg via INTRAVENOUS

## 2022-12-03 MED ORDER — FENTANYL CITRATE (PF) 100 MCG/2ML IJ SOLN
INTRAMUSCULAR | Status: AC
  Filled 2022-12-03: qty 2

## 2022-12-03 MED ORDER — PROPOFOL 500 MG/50ML IV EMUL
INTRAVENOUS | Status: DC | PRN
  Administered 2022-12-03: 125 ug/kg/min via INTRAVENOUS

## 2022-12-03 MED ORDER — DEXAMETHASONE SODIUM PHOSPHATE 10 MG/ML IJ SOLN
INTRAMUSCULAR | Status: AC
  Filled 2022-12-03: qty 1

## 2022-12-03 MED ORDER — DEXAMETHASONE SODIUM PHOSPHATE 4 MG/ML IJ SOLN
INTRAMUSCULAR | Status: DC | PRN
  Administered 2022-12-03: 5 mg via INTRAVENOUS

## 2022-12-03 MED ORDER — ENSURE PRE-SURGERY PO LIQD: 296.0000 mL | Freq: Once | ORAL | Status: DC

## 2022-12-03 MED ORDER — LIDOCAINE 2% (20 MG/ML) 5 ML SYRINGE
INTRAMUSCULAR | Status: AC
  Filled 2022-12-03: qty 5

## 2022-12-03 MED ORDER — CEFAZOLIN SODIUM-DEXTROSE 2-4 GM/100ML-% IV SOLN
INTRAVENOUS | Status: AC
  Filled 2022-12-03: qty 100

## 2022-12-03 MED ORDER — ACETAMINOPHEN 500 MG PO TABS
ORAL_TABLET | ORAL | Status: AC
  Filled 2022-12-03: qty 2

## 2022-12-03 SURGICAL SUPPLY — 30 items
ADH SKN CLS APL DERMABOND .7 (GAUZE/BANDAGES/DRESSINGS) ×1
APL PRP STRL LF DISP 70% ISPRP (MISCELLANEOUS) ×1
BLADE SURG 15 STRL LF DISP TIS (BLADE) ×2 IMPLANT
BLADE SURG 15 STRL SS (BLADE) ×1
CHLORAPREP W/TINT 26 (MISCELLANEOUS) ×2 IMPLANT
COVER BACK TABLE 60X90IN (DRAPES) ×2 IMPLANT
COVER MAYO STAND STRL (DRAPES) ×2 IMPLANT
DERMABOND ADVANCED .7 DNX12 (GAUZE/BANDAGES/DRESSINGS) ×2 IMPLANT
DRAPE LAPAROTOMY 100X72 PEDS (DRAPES) ×2 IMPLANT
DRAPE UTILITY XL STRL (DRAPES) ×2 IMPLANT
ELECT COATED BLADE 2.86 ST (ELECTRODE) ×2 IMPLANT
ELECT REM PT RETURN 9FT ADLT (ELECTROSURGICAL) ×1
ELECTRODE REM PT RTRN 9FT ADLT (ELECTROSURGICAL) ×2 IMPLANT
GLOVE BIO SURGEON STRL SZ7 (GLOVE) ×2 IMPLANT
GLOVE BIOGEL PI IND STRL 7.5 (GLOVE) ×2 IMPLANT
GOWN STRL REUS W/ TWL LRG LVL3 (GOWN DISPOSABLE) ×4 IMPLANT
GOWN STRL REUS W/TWL LRG LVL3 (GOWN DISPOSABLE) ×2
NDL HYPO 25X1 1.5 SAFETY (NEEDLE) ×2 IMPLANT
NEEDLE HYPO 25X1 1.5 SAFETY (NEEDLE) ×1 IMPLANT
PACK BASIN DAY SURGERY FS (CUSTOM PROCEDURE TRAY) ×2 IMPLANT
PENCIL SMOKE EVACUATOR (MISCELLANEOUS) ×2 IMPLANT
SLEEVE SCD COMPRESS KNEE MED (STOCKING) IMPLANT
SPIKE FLUID TRANSFER (MISCELLANEOUS) ×2 IMPLANT
SPONGE T-LAP 4X18 ~~LOC~~+RFID (SPONGE) ×2 IMPLANT
STRIP CLOSURE SKIN 1/2X4 (GAUZE/BANDAGES/DRESSINGS) ×2 IMPLANT
SUT MNCRL AB 4-0 PS2 18 (SUTURE) ×2 IMPLANT
SUT VIC AB 3-0 SH 27 (SUTURE) ×1
SUT VIC AB 3-0 SH 27X BRD (SUTURE) ×2 IMPLANT
SYR CONTROL 10ML LL (SYRINGE) ×2 IMPLANT
TOWEL GREEN STERILE FF (TOWEL DISPOSABLE) ×2 IMPLANT

## 2022-12-03 NOTE — H&P (Signed)
Jenna Cross is an 37 y.o. female.   Chief Complaint: breast cancer HPI: 69 yof completed breast cancer therapy no longer needs venous access.  Would like to do port removal.    Past Medical History:  Diagnosis Date   Anemia    pregnancy related   Anxiety    Anxiety    no medication   Cancer ([place])    breast   COVID 09/2020   mild case   Family history of prostate cancer 07/17/2021   History of DVT (deep vein thrombosis)    in her neck   History of radiation therapy    Right breast 02/22/22-04/13/22- Dr. Gery Pray    Past Surgical History:  Procedure Laterality Date   BREAST IMPLANT EXCHANGE Right 01/25/2022   Procedure: BREAST IMPLANT REMOVAL;  Surgeon: Wallace Going, DO;  Location: [place];  Service: Plastics;  Laterality: Right;  1 hour   BREAST RECONSTRUCTION WITH PLACEMENT OF TISSUE EXPANDER AND FLEX HD (ACELLULAR HYDRATED DERMIS) Bilateral 12/06/2021   Procedure: BREAST RECONSTRUCTION WITH PLACEMENT OF TISSUE EXPANDER AND FLEX HD (ACELLULAR HYDRATED DERMIS);  Surgeon: Wallace Going, DO;  Location: [place];  Service: Plastics;  Laterality: Bilateral;   BREAST SURGERY     cyst removal from right breast    CESAREAN SECTION N/A 01/31/2021   Procedure: CESAREAN SECTION;  Surgeon: Dian Queen, MD;  Location: [place] LD ORS;  Service: Obstetrics;  Laterality: N/A;   CHG RADIOLOGICAL GUIDANCE PRQ DRG W/PLMT CATH RS&I  01/23/2022   excision of fibroadenoma of breast     PORTACATH PLACEMENT N/A 07/11/2021   Procedure: INSERTION PORT-A-CATH;  Surgeon: Rolm Bookbinder, MD;  Location: [place];  Service: General;  Laterality: N/A;   RADIOACTIVE SEED GUIDED AXILLARY SENTINEL LYMPH NODE Right 12/06/2021   Procedure: RADIOACTIVE SEED GUIDED RIGHT AXILLARY SENTINEL LYMPH NODE EXCISION;  Surgeon: Rolm Bookbinder, MD;  Location: [place];  Service: General;  Laterality: Right;   REMOVAL OF BILATERAL TISSUE EXPANDERS WITH  PLACEMENT OF BILATERAL BREAST IMPLANTS Bilateral 01/01/2022   Procedure: REMOVAL OF BILATERAL TISSUE EXPANDERS WITH PLACEMENT OF BILATERAL BREAST IMPLANTS;  Surgeon: Wallace Going, DO;  Location: [place];  Service: Plastics;  Laterality: Bilateral;   SENTINEL NODE BIOPSY Right 12/06/2021   Procedure: RIGHT AXILLARY SENTINEL NODE BIOPSY;  Surgeon: Rolm Bookbinder, MD;  Location: [place];  Service: General;  Laterality: Right;   TONSILLECTOMY     and adenoids   TONSILLECTOMY AND ADENOIDECTOMY     TOTAL MASTECTOMY Bilateral 12/06/2021   Procedure: BILATERAL SKIN SPARING TOTAL MASTECTOMY;  Surgeon: Rolm Bookbinder, MD;  Location: [place];  Service: General;  Laterality: Bilateral;   WISDOM TOOTH EXTRACTION      Family History  Problem Relation Age of Onset   Migraines Mother    Prostate cancer Maternal Grandfather        dx after 57   Lymphoma Paternal Aunt        dx late 12s   Colon cancer Neg Hx    Esophageal cancer Neg Hx    Pancreatic cancer Neg Hx    Stomach cancer Neg Hx    Social History:  reports that she has never smoked. She has never used smokeless tobacco. She reports that she does not currently use alcohol. She reports that she does not use drugs.  Allergies:  Allergies  Allergen Reactions   Wound Dressings Other (See Comments)    Honey comb dressing causes  redness and inflammation     Medications Prior to Admission  Medication Sig Dispense Refill   acetaminophen (TYLENOL) 500 MG tablet Take 1,000 mg by mouth every 6 (six) hours as needed for mild pain or headache.     ALPRAZolam (XANAX) 0.5 MG tablet Take 0.5 mg by mouth 3 (three) times daily as needed for anxiety.     clindamycin (CLINDAGEL) 1 % gel Apply topically 2 (two) times daily. Apply to affect area two times daily 30 g 1   Goserelin Acetate (ZOLADEX [place])      Investigational tucatanib/placebo 150 MG tablet (ONT-380,NSC [id number]) [facility] [id number] Take 2 tablets (300 mg  total) 2 (two) times dailyfor 21 days.  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 Tucatinib or placebo should take orally twice daily with or without food, approximately 12 hours apart between doses. Tablets must be swallowed whole and may not be crushed, chewed or dissolved in liquid. CAUTION: Chemotherapy/Biotherapy 84 tablet 0   letrozole (FEMARA) 2.5 MG tablet Take 1 tablet (2.5 mg total) by mouth daily. 90 tablet 3   loratadine (CLARITIN) 10 MG tablet Take 10 mg by mouth daily as needed for allergies.     ondansetron (ZOFRAN) 8 MG tablet TAKE 1 TABLET BY MOUTH EVERY 8 HOURS AS NEEDED FOR NAUSEA 30 tablet 3   polyethylene glycol (MIRALAX / GLYCOLAX) 17 g packet Take 17 g by mouth daily as needed for mild constipation.     valACYclovir (VALTREX) 500 MG tablet Take 500 mg by mouth every evening.     venlafaxine XR (EFFEXOR-XR) 37.5 MG 24 hr capsule Take 1 capsule (37.5 mg total) by mouth daily with breakfast. 90 capsule 1   XARELTO 20 MG TABS tablet TAKE 1 TABLET BY MOUTH DAILY WITH SUPPER 30 tablet 5   diphenhydrAMINE (BENADRYL) 25 mg capsule Take 25 mg by mouth every 6 (six) hours as needed for itching or allergies.     diphenoxylate-atropine (LOMOTIL) 2.5-0.025 MG tablet Take 1 tablet by mouth 3 (three) times daily as needed for diarrhea or loose stools. 30 tablet 3   hydrocortisone cream 0.5 % Apply 1 Application topically as needed for itching.     lidocaine-prilocaine (EMLA) cream lidocaine-prilocaine 2.5 %-2.5 % topical cream  APPLY TO AFFECTED AREA ONCE AS DIRECTED     LORazepam (ATIVAN) 0.5 MG tablet TAKE 1 TABLET (0.5 MG TOTAL) BY MOUTH AT BEDTIME AS NEEDED (NAUSEA OR VOMITING).      Results for orders placed or performed during the hospital encounter of 12/03/22 (from the past 48 hour(s))  Pregnancy, urine POC     Status: None   Collection Time: 12/03/22  6:49 AM  Result Value Ref Range   Preg Test, Ur NEGATIVE NEGATIVE    Comment:        THE SENSITIVITY OF THIS METHODOLOGY IS >24 mIU/mL    No  results found.  Review of Systems  All other systems reviewed and are negative.   Height '5\' 4"'$  (1.626 m), weight 61.1 kg, not currently breastfeeding. Physical Exam  Left ij port in place Cv regular Pulm effort normal  Assessment/Plan Port removal  Rolm Bookbinder, MD 12/03/2022, 7:03 AM

## 2022-12-03 NOTE — Discharge Instructions (Addendum)
Post Anesthesia Home Care Instructions  Activity: Get plenty of rest for the remainder of the day. A responsible individual must stay with you for 24 hours following the procedure.  For the next 24 hours, DO NOT: -Drive a car -Paediatric nurse -Drink alcoholic beverages -Take any medication unless instructed by your physician -Make any legal decisions or sign important papers.  Meals: Start with liquid foods such as gelatin or soup. Progress to regular foods as tolerated. Avoid greasy, spicy, heavy foods. If nausea and/or vomiting occur, drink only clear liquids until the nausea and/or vomiting subsides. Call your physician if vomiting continues.  Special Instructions/Symptoms: Your throat may feel dry or sore from the anesthesia or the breathing tube placed in your throat during surgery. If this causes discomfort, gargle with warm salt water. The discomfort should disappear within 24 hours.  If you had a scopolamine patch placed behind your ear for the management of post- operative nausea and/or vomiting:  1. The medication in the patch is effective for 72 hours, after which it should be removed.  Wrap patch in a tissue and discard in the trash. Wash hands thoroughly with soap and water. 2. You may remove the patch earlier than 72 hours if you experience unpleasant side effects which may include dry mouth, dizziness or visual disturbances. 3. Avoid touching the patch. Wash your hands with soap and water after contact with the patch.     Next dose of tylenol may be take at [facility] Office Phone Number [phone]  POST OP INSTRUCTIONS Take 400 mg of ibuprofen every 8 hours or 650 mg tylenol every 6 hours for next 72 hours then as needed. Use ice several times daily also.  Take your usually prescribed medications unless otherwise directed You should eat very light the first 24 hours after surgery, such as soup, crackers, pudding, etc.  Resume your normal  diet the day after surgery. Most patients will experience some swelling and bruising.  Ice packs will help.  Swelling and bruising can take several days to resolve.  It is common to experience some constipation if taking pain medication after surgery.  Increasing fluid intake and taking a stool softener will usually help or prevent this problem from occurring.  A mild laxative (Milk of Magnesia or Miralax) should be taken according to package directions if there are no bowel movements after 48 hours. I used skin glue on the incision, you may shower in 24 hours.  The glue will flake off over the next 2-3 weeks.  Any sutures or staples will be removed at the office during your follow-up visit. ACTIVITIES:  You may resume regular daily activities (gradually increasing) beginning the next day.  Wearing a good support bra or sports bra minimizes pain and swelling.  You may have sexual intercourse when it is comfortable. You may drive when you no longer are taking prescription pain medication, you can comfortably wear a seatbelt, and you can safely maneuver your car and apply brakes. RETURN TO WORK:  ______________________________________________________________________________________  Your doctor's nurse will typically make your follow-up appointment when she calls you with your pathology report.  Expect your pathology report 3-4 business days after your surgery.  You may call to check if you do not hear from [place] after three days. OTHER INSTRUCTIONS: _______________________________________________________________________________________________  _____________________________________________________________________________________________________________________________________ _____________________________________________________________________________________________________________________________________ _____________________________________________________________________________________________________________________________________  WHEN TO CALL DR WAKEFIELD: Fever over 101.0 Nausea and/or vomiting. Extreme swelling or bruising. Continued bleeding from incision. Increased pain, redness, or drainage from the  incision.  The clinic staff is available to answer your questions during regular business hours.  Please don't hesitate to call and ask to speak to one of the nurses for clinical concerns.  If you have a medical emergency, go to the nearest emergency room or call 911.  A surgeon from [facility] is always on call at the hospital.  For further questions, please visit [email or web address]

## 2022-12-03 NOTE — Interval H&P Note (Signed)
History and Physical Interval Note:  12/03/2022 7:05 AM  Jenna Cross  has presented today for surgery, with the diagnosis of BREAST CANCER.  The various methods of treatment have been discussed with the patient and family. After consideration of risks, benefits and other options for treatment, the patient has consented to  Procedure(s): REMOVAL PORT-A-CATH (N/A) as a surgical intervention.  The patient's history has been reviewed, patient examined, no change in status, stable for surgery.  I have reviewed the patient's chart and labs.  Questions were answered to the patient's satisfaction.     Rolm Bookbinder

## 2022-12-03 NOTE — Anesthesia Preprocedure Evaluation (Addendum)
Anesthesia Evaluation  Patient identified by MRN, date of birth, ID band Patient awake    Reviewed: Allergy & Precautions, H&P , NPO status , Patient's Chart, lab work & pertinent test results  Airway Mallampati: II  TM Distance: >3 FB Neck ROM: Full    Dental no notable dental hx.    Pulmonary neg pulmonary ROS   Pulmonary exam normal breath sounds clear to auscultation       Cardiovascular Exercise Tolerance: Good negative cardio ROS Normal cardiovascular exam Rhythm:Regular Rate:Normal     Neuro/Psych   Anxiety     negative neurological ROS  negative psych ROS   GI/Hepatic negative GI ROS, Neg liver ROS,,,  Endo/Other  negative endocrine ROS    Renal/GU negative Renal ROS  negative genitourinary   Musculoskeletal negative musculoskeletal ROS (+)    Abdominal   Peds negative pediatric ROS (+)  Hematology negative hematology ROS (+)   Anesthesia Other Findings Breast cancer  Reproductive/Obstetrics negative OB ROS                             Anesthesia Physical Anesthesia Plan  ASA: 3  Anesthesia Plan: MAC   Post-op Pain Management: Minimal or no pain anticipated   Induction: Intravenous  PONV Risk Score and Plan: 2 and Treatment may vary due to age or medical condition, Midazolam and Ondansetron  Airway Management Planned: Simple Face Mask  Additional Equipment: None  Intra-op Plan:   Post-operative Plan: Extubation in OR  Informed Consent:   Plan Discussed with: CRNA, Anesthesiologist and Surgeon  Anesthesia Plan Comments: ( )        Anesthesia Quick Evaluation

## 2022-12-03 NOTE — Transfer of Care (Signed)
Immediate Anesthesia Transfer of Care Note  Patient: Jenna Cross  Procedure(s) Performed: REMOVAL PORT-A-CATH (Left)  Patient Location: PACU  Anesthesia Type:General  Level of Consciousness: awake, alert , and oriented  Airway & Oxygen Therapy: Patient Spontanous Breathing and Patient connected to face mask oxygen  Post-op Assessment: Report given to RN and Post -op Vital signs reviewed and stable  Post vital signs: Reviewed and stable  Last Vitals:  Vitals Value Taken Time  BP 126/80 12/03/22 0803  Temp 36.8 C 12/03/22 0803  Pulse 90 12/03/22 0807  Resp 17 12/03/22 0807  SpO2 100 % 12/03/22 0807  Vitals shown include unvalidated device data.  Last Pain:  Vitals:   12/03/22 0803  TempSrc:   PainSc: 0-No pain      Patients Stated Pain Goal: 3 ([id number])  Complications: No notable events documented.

## 2022-12-03 NOTE — Op Note (Signed)
Preoperative diagnosis: Breast cancer, no longer needs venous access Postoperative diagnosis: Same as above Procedure: Port removal Surgeon: Dr. Serita Grammes Anesthesia: Local with sedation Estimated blood loss: Minimal Complications: None Drains: None Specimens: None Sponge and needle count was correct completion Disposition recovery stable  Indications: This is a 37 year old female who has completed her therapy for breast cancer.  She desires port removal.  Procedure: After informed consent was obtained she was taken to the operating room.  She was placed under monitored anesthesia care.  She was then prepped and draped in the standard sterile surgical fashion.  Antibiotics were given.  Surgical timeout was then performed.  I infiltrated Marcaine at her old incision site in her left chest.  I reentered this incision.  I then remove the port, line, suture material in their entirety.  This was passed off the table.  Hemostasis was obtained.  I closed this with 3-0 Vicryl for Monocryl.  Glue and a Steri-Strip were placed.  She tolerated this well and was transferred to recovery stable.

## 2022-12-03 NOTE — Anesthesia Postprocedure Evaluation (Signed)
Anesthesia Post Note  Patient: Jenna Cross  Procedure(s) Performed: REMOVAL PORT-A-CATH (Left)     Patient location during evaluation: PACU Anesthesia Type: General Level of consciousness: awake and alert Pain management: pain level controlled Vital Signs Assessment: post-procedure vital signs reviewed and stable Respiratory status: spontaneous breathing, nonlabored ventilation and respiratory function stable Cardiovascular status: blood pressure returned to baseline and stable Postop Assessment: no apparent nausea or vomiting Anesthetic complications: no   No notable events documented.  Last Vitals:  Vitals:   12/03/22 0845 12/03/22 0906  BP: 119/74 122/75  Pulse: 85 79  Resp: 18 16  Temp:  36.5 C  SpO2: 99% 98%    Last Pain:  Vitals:   12/03/22 0906  TempSrc:   PainSc: 0-No pain                 Lynda Rainwater

## 2022-12-04 ENCOUNTER — Encounter (HOSPITAL_BASED_OUTPATIENT_CLINIC_OR_DEPARTMENT_OTHER): Payer: Self-pay | Admitting: General Surgery

## 2022-12-05 ENCOUNTER — Telehealth: Payer: Self-pay | Admitting: *Deleted

## 2022-12-05 ENCOUNTER — Inpatient Hospital Stay: Payer: 59

## 2022-12-05 ENCOUNTER — Telehealth: Payer: Self-pay | Admitting: Hematology and Oncology

## 2022-12-05 NOTE — Telephone Encounter (Signed)
[facility]  Called patient to inform of next study visit scheduled for 01/09/23 at 9:15 am for research labs, meet with research nurse and then have Echocardiogram. Reminded patient to bring all the study drug and medication diary. Asked patient to call research nurse if she needs to reschedule. She verbalized understanding.  Patient states she called and left message this morning to reschedule her zoladex injection appointment today to tomorrow morning at 9:15 am or 9:30 am if available. Informed patient I will also send message to Dr. Geralyn Flash nurse to get appointment rescheduled.  Patient reports her port a cath removal went well and she is recovering without any problems.  Foye Spurling, BSN, RN, [place] Nurse II [phone] 12/05/2022 1:02 PM

## 2022-12-05 NOTE — Telephone Encounter (Signed)
Per 2/28 IB reached out to patient to move appointment, patient confirmed.

## 2022-12-06 ENCOUNTER — Inpatient Hospital Stay: Payer: 59

## 2022-12-06 ENCOUNTER — Telehealth: Payer: 59 | Admitting: Family Medicine

## 2022-12-06 ENCOUNTER — Telehealth: Payer: Self-pay | Admitting: Obstetrics and Gynecology

## 2022-12-06 ENCOUNTER — Other Ambulatory Visit: Payer: Self-pay | Admitting: Hematology and Oncology

## 2022-12-06 ENCOUNTER — Encounter: Payer: Self-pay | Admitting: Hematology and Oncology

## 2022-12-06 VITALS — BP 129/80 | HR 95 | Temp 98.4°F | Resp 18

## 2022-12-06 DIAGNOSIS — Z5111 Encounter for antineoplastic chemotherapy: Secondary | ICD-10-CM | POA: Diagnosis not present

## 2022-12-06 DIAGNOSIS — B999 Unspecified infectious disease: Secondary | ICD-10-CM

## 2022-12-06 DIAGNOSIS — Z95828 Presence of other vascular implants and grafts: Secondary | ICD-10-CM

## 2022-12-06 DIAGNOSIS — Z170 Estrogen receptor positive status [ER+]: Secondary | ICD-10-CM

## 2022-12-06 MED ORDER — GOSERELIN ACETATE 3.6 MG ~~LOC~~ IMPL
3.6000 mg | DRUG_IMPLANT | Freq: Once | SUBCUTANEOUS | Status: AC
  Administered 2022-12-06: 3.6 mg via SUBCUTANEOUS
  Filled 2022-12-06: qty 3.6

## 2022-12-06 NOTE — Telephone Encounter (Signed)
Per 2/29 IB moved patient up to 10:45am, flush room aware.

## 2022-12-06 NOTE — Progress Notes (Signed)
Because recent surgery with complication and or possible infection- please call the surgeon's office, your condition warrants further evaluation and I recommend that you be seen in a face to face visit.   NOTE: There will be NO CHARGE for this eVisit

## 2022-12-19 LAB — SIGNATERA ONLY (NATERA MANAGED)
SIGNATERA MTM READOUT: 0 MTM/ml
SIGNATERA TEST RESULT: NEGATIVE

## 2022-12-20 ENCOUNTER — Other Ambulatory Visit: Payer: 59

## 2022-12-25 ENCOUNTER — Encounter (HOSPITAL_COMMUNITY): Payer: Self-pay

## 2023-01-02 ENCOUNTER — Inpatient Hospital Stay: Payer: 59 | Attending: Hematology and Oncology

## 2023-01-02 ENCOUNTER — Other Ambulatory Visit: Payer: Self-pay

## 2023-01-02 VITALS — BP 112/75 | HR 99 | Temp 97.3°F | Resp 16

## 2023-01-02 DIAGNOSIS — C50411 Malignant neoplasm of upper-outer quadrant of right female breast: Secondary | ICD-10-CM | POA: Diagnosis present

## 2023-01-02 DIAGNOSIS — Z5111 Encounter for antineoplastic chemotherapy: Secondary | ICD-10-CM | POA: Diagnosis not present

## 2023-01-02 DIAGNOSIS — Z95828 Presence of other vascular implants and grafts: Secondary | ICD-10-CM

## 2023-01-02 DIAGNOSIS — Z79899 Other long term (current) drug therapy: Secondary | ICD-10-CM | POA: Insufficient documentation

## 2023-01-02 DIAGNOSIS — Z170 Estrogen receptor positive status [ER+]: Secondary | ICD-10-CM

## 2023-01-02 MED ORDER — GOSERELIN ACETATE 3.6 MG ~~LOC~~ IMPL
3.6000 mg | DRUG_IMPLANT | Freq: Once | SUBCUTANEOUS | Status: AC
  Administered 2023-01-02: 3.6 mg via SUBCUTANEOUS
  Filled 2023-01-02: qty 3.6

## 2023-01-04 ENCOUNTER — Inpatient Hospital Stay: Payer: 59

## 2023-01-09 ENCOUNTER — Inpatient Hospital Stay: Payer: 59 | Admitting: *Deleted

## 2023-01-09 ENCOUNTER — Other Ambulatory Visit: Payer: Self-pay

## 2023-01-09 ENCOUNTER — Ambulatory Visit (HOSPITAL_COMMUNITY)
Admission: RE | Admit: 2023-01-09 | Discharge: 2023-01-09 | Disposition: A | Discharge status: Home / Self Care | Payer: 59 | Source: Ambulatory Visit | Attending: Hematology and Oncology | Admitting: Hematology and Oncology

## 2023-01-09 ENCOUNTER — Inpatient Hospital Stay: Payer: 59 | Attending: Hematology and Oncology

## 2023-01-09 DIAGNOSIS — Z86718 Personal history of other venous thrombosis and embolism: Secondary | ICD-10-CM | POA: Insufficient documentation

## 2023-01-09 DIAGNOSIS — C50411 Malignant neoplasm of upper-outer quadrant of right female breast: Secondary | ICD-10-CM | POA: Insufficient documentation

## 2023-01-09 DIAGNOSIS — Z170 Estrogen receptor positive status [ER+]: Secondary | ICD-10-CM | POA: Insufficient documentation

## 2023-01-09 DIAGNOSIS — Z79899 Other long term (current) drug therapy: Secondary | ICD-10-CM | POA: Insufficient documentation

## 2023-01-09 DIAGNOSIS — L299 Pruritus, unspecified: Secondary | ICD-10-CM | POA: Diagnosis not present

## 2023-01-09 DIAGNOSIS — Z9882 Breast implant status: Secondary | ICD-10-CM | POA: Insufficient documentation

## 2023-01-09 DIAGNOSIS — Z5111 Encounter for antineoplastic chemotherapy: Secondary | ICD-10-CM | POA: Insufficient documentation

## 2023-01-09 DIAGNOSIS — Z9013 Acquired absence of bilateral breasts and nipples: Secondary | ICD-10-CM | POA: Insufficient documentation

## 2023-01-09 DIAGNOSIS — Z79811 Long term (current) use of aromatase inhibitors: Secondary | ICD-10-CM | POA: Insufficient documentation

## 2023-01-09 DIAGNOSIS — Z8616 Personal history of COVID-19: Secondary | ICD-10-CM | POA: Diagnosis not present

## 2023-01-09 DIAGNOSIS — I427 Cardiomyopathy due to drug and external agent: Secondary | ICD-10-CM | POA: Insufficient documentation

## 2023-01-09 LAB — CBC WITH DIFFERENTIAL (CANCER CENTER ONLY)
Abs Immature Granulocytes: 0.01 10*3/uL (ref 0.00–0.07)
Basophils Absolute: 0.1 10*3/uL (ref 0.0–0.1)
Basophils Relative: 1 %
Eosinophils Absolute: 0.3 10*3/uL (ref 0.0–0.5)
Eosinophils Relative: 7 %
HCT: 38.8 % (ref 36.0–46.0)
Hemoglobin: 13.3 g/dL (ref 12.0–15.0)
Immature Granulocytes: 0 %
Lymphocytes Relative: 37 %
Lymphs Abs: 1.7 10*3/uL (ref 0.7–4.0)
MCH: 31.6 pg (ref 26.0–34.0)
MCHC: 34.3 g/dL (ref 30.0–36.0)
MCV: 92.2 fL (ref 80.0–100.0)
Monocytes Absolute: 0.4 10*3/uL (ref 0.1–1.0)
Monocytes Relative: 8 %
Neutro Abs: 2.2 10*3/uL (ref 1.7–7.7)
Neutrophils Relative %: 47 %
Platelet Count: 199 10*3/uL (ref 150–400)
RBC: 4.21 MIL/uL (ref 3.87–5.11)
RDW: 13.1 % (ref 11.5–15.5)
WBC Count: 4.6 10*3/uL (ref 4.0–10.5)
nRBC: 0 % (ref 0.0–0.2)

## 2023-01-09 LAB — CMP (CANCER CENTER ONLY)
ALT: 14 U/L (ref 0–44)
AST: 21 U/L (ref 15–41)
Albumin: 4 g/dL (ref 3.5–5.0)
Alkaline Phosphatase: 123 U/L (ref 38–126)
Anion gap: 6 (ref 5–15)
BUN: 11 mg/dL (ref 6–20)
CO2: 25 mmol/L (ref 22–32)
Calcium: 9.4 mg/dL (ref 8.9–10.3)
Chloride: 108 mmol/L (ref 98–111)
Creatinine: 0.71 mg/dL (ref 0.44–1.00)
GFR, Estimated: >60 mL/min (ref >60)
Glucose, Bld: 91 mg/dL (ref 70–99)
Potassium: 4 mmol/L (ref 3.5–5.1)
Sodium: 139 mmol/L (ref 135–145)
Total Bilirubin: 0.5 mg/dL (ref 0.3–1.2)
Total Protein: 7 g/dL (ref 6.5–8.1)

## 2023-01-09 LAB — ECHOCARDIOGRAM COMPLETE
AR max vel: 2.18 cm2
AV Area VTI: 2.17 cm2
AV Area mean vel: 1.9 cm2
AV Mean grad: 3 mmHg
AV Peak grad: 5.4 mmHg
Ao pk vel: 1.16 m/s
Area-P 1/2: 3.61 cm2
S' Lateral: 2.9 cm
Single Plane A4C EF: 60.2 %

## 2023-01-09 LAB — RESEARCH LABS

## 2023-01-09 NOTE — Progress Notes (Signed)
  Echocardiogram 2D Echocardiogram has been performed.  Jenna Cross 01/09/2023, 10:19 AM

## 2023-01-09 NOTE — Research (Addendum)
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     ePROs: Not required at this time point.   LABS: Optional research labs collected per protocol. CBC and CMP collected per provider.    VITAL SIGNS: Not required for this visit.   H&P: Not required this visit.    RESEARCH: Concomitant medications reviewed; patient stopped taking xarelto as directed since port a cath was removed.  Patient took medrol dose pack and used triamcinolone ointment for rash she developed at port a cath removal incision but has since completed those medications. Current list is confirmed up to date.   Adverse Events:  Solicited and other adverse events assessed/reported per protocol.  Patient reported the following; Mild anorexia (grade 1) for three days following her last T-DM1 infusion.   Moderate disturbed sleep/insomnia (grade 2) intermittent ongoing.  Ongoing fatigue (grade 1 -- baseline). Mild nausea (grade 1) for 3 days after her last T-DMI infusion. She denies vomiting and states she took zofran once or twice for the nausea which helped.    Today's lab showed resolution of alkaline phosphatase increase (grade 1).  Patient states she has not noticed any further right sided upper back pain (grade 1) since last visit.   Reports ongoing mild tingling in her fingertips on bilateral hands, Peripheral sensory neuropathy (grade1).  It does not interfere with her ability to button her clothes or any other activities.  Patient reports Port-a-Cath removed and she tolerated well except she developed a rash (grade 2) at the surgical site where her port-a-cath was removed. It lasted for about 2 weeks and was localized to just the area immediately over the incision site. Patient was prescribed medrol dose pack  and triamcinolone ointment by the surgeon and the rash completely resolved. Patient states she thinks it was an allergic skin reaction to something used to clean or cover the site for surgery.  Patient reports nose bleed x 1 about 1 week after last treatment. It resolved with holding pressure after a few minutes. Epistaxis (grade 1).  Patient reports having a few loose stools during the first 2 weeks after her last study visit. She did not have any increase in frequency of stools over baseline, Diarrhea (grade 1).   See AE table below.  Study Drug:  Patient returned remaining cycle 14 tucatinib/placebo and medication diary. She returned two bottles, one bottle with 0 pills remaining and one unopened bottle with 42 tablets remaining (0 + 42 = 42; 100 total tablets dispensed - 42 remaining =  58 tablets taken).  Medication diary reflects the same: 58 tablets taken; 29 total doses.  Patient confirms she took her last dose of study drug on 12/13/22. Remaining cycle 14 tucatinib/placebo bottles returned to Delorise Shiner, Pharmacist to return to [facility] pharmacy.    ADVERSE EVENT LOG: Study/Protocol: [facility] [id number] [facility] 11/23/22-01/09/23  Baseline: anxiety (grade 2), fatigue (grade 1), herpes simplex virus, hypotension (grade 1), vascular access complication (grade 3). Not reportable (unrelated/unlikely to be related to study treatment and < grade 3): breast pain (grade 1), bruising (grade 1), constipation (grade 1), muscle weakness -- upper limb (grade 1).  Event (solicited vs other reportable) Grade CTCAE v5.0 Onset Date Resolved Date Attribution to Study Treatment Treatment Comments  Neutrophil count decreased 0 N/A N/A N/A N/A   Platelet count decreased 0 N/A  N/A N/A N/A   Rash (maculo-papular) 0 N/A N/A N/A N/A    Epistaxis 1 11/30/22 11/30/22 Unrelated None    Diarrhea 1 11/24/22 12/07/22 Possible None Patient reported a baseline of 2-3 stools/day.   Nausea 1 11/24/22 11/26/22 Possible Ondansetron  PRN   Vomiting 0 N/A N/A N/A N/A   Bilirubin increased 0 N/A N/A N/A N/A    AST increased 0 N/A N/A N/A N/A    ALT increased 0 N/A N/A N/A N/A    Anemia 0 N/A N/A N/A N/A    Mucositis oral 0 N/A N/A N/A N/A    Palmar-plantar erythrodysesthesia 0 N/A N/A N/A N/A    Peripheral neuropathy -- motor 0 N/A N/A N/A N/A    Peripheral neuropathy -- sensory 1 10/10/2022 Ongoing Possible None Mild, intermittent  Anorexia 1 11/24/22 11/26/22 Probable N/A Patient reported loss of appetite x3 days after T-DM1 infusion.  Insomnia 2 06/22/22 Ongoing Probable Adjustments to medication schedule Patient reported moderate disturbed sleep.  Increased Alkaline Phosphatase 1 11/01/22 01/09/23 Unlikely none   Back Pain 1 11/16/22 11/24/22 Unrelated none Intermittent with coughing or sneezing  Rash/Contact dermatitis at surgical site [facility] removed) 2 12/04/22 12/18/22 Unrelated Medrol dose pack and triamcinolone ointment     ECHO: Patient was escorted to admitting to check in for scheduled echocardiogram today.   PLAN: Patient is scheduled for follow up visit with Dr. Pamelia Hoit on 05/24/23. Patient has been provided direct contact information and is encouraged to contact this nurse for any questions or concerns prior to next visit. She verbalized understanding.  Thanked patient for her participation on this clinical trial.   Domenica Reamer, BSN, RN, CCRP Clinical Research Nurse II [phone] 01/09/2023

## 2023-01-10 ENCOUNTER — Encounter: Payer: Self-pay | Admitting: Hematology and Oncology

## 2023-01-16 ENCOUNTER — Encounter: Payer: Self-pay | Admitting: Hematology and Oncology

## 2023-01-22 ENCOUNTER — Other Ambulatory Visit: Payer: Self-pay

## 2023-01-22 ENCOUNTER — Encounter: Payer: Self-pay | Admitting: Adult Health

## 2023-01-22 ENCOUNTER — Inpatient Hospital Stay (HOSPITAL_BASED_OUTPATIENT_CLINIC_OR_DEPARTMENT_OTHER): Payer: 59 | Admitting: Adult Health

## 2023-01-22 VITALS — BP 119/54 | HR 96 | Temp 98.1°F | Resp 16 | Ht 64.0 in | Wt 133.9 lb

## 2023-01-22 DIAGNOSIS — Z5111 Encounter for antineoplastic chemotherapy: Secondary | ICD-10-CM | POA: Diagnosis not present

## 2023-01-22 DIAGNOSIS — Z170 Estrogen receptor positive status [ER+]: Secondary | ICD-10-CM | POA: Diagnosis not present

## 2023-01-22 DIAGNOSIS — C50411 Malignant neoplasm of upper-outer quadrant of right female breast: Secondary | ICD-10-CM

## 2023-01-22 DIAGNOSIS — R21 Rash and other nonspecific skin eruption: Secondary | ICD-10-CM

## 2023-01-22 MED ORDER — CLOTRIMAZOLE-BETAMETHASONE 1-0.05 % EX CREA: 1.0000 | TOPICAL_CREAM | Freq: Two times a day (BID) | CUTANEOUS | 0 refills | Status: DC

## 2023-01-22 NOTE — Assessment & Plan Note (Signed)
Palpable lump in the right breast. Diagnostic mammogram and [place]: a 3.8 cm mass at the retroareolar right breast, 1 prominent right axillary lymph node with a 4 mm cortex, 1 other which is borderline measuring 3 mm. 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  3 o'clock position: Biopsy fibroadenoma   Treatment plan: 1. Neoadjuvant chemotherapy with TCH Perjeta 6 cycles followed by Herceptin Perjeta maintenance versus Kadcyla maintenance (based on response to neoadjuvant chemo) for 1 year 2. Followed by bilateral mastectomies with sentinel lymph node study 3. Followed by adjuvant radiation therapy  4.  Followed by antiestrogen therapy 5.  Followed by neratinib   Breast MRI 06/28/2021: Confluent mass right breast 11.1 x 7.8 x 9.5 cm, 1 enlarged lymph node and second borderline lymph node in the right axilla Brain MRI 07/03/2021: No evidence of malignancy. CT CAP and bone scan: 07/10/2021: Numerous enhancing right breast masses and right axillary lymph nodes, nonspecific small pulmonary nodules, bone scan: No bone metastases URCC nausea study, ------------------------------------------------------------------------------------------------------------------------------------ Current Treatment: Zoladex and letrozole  She is being seen for skin irritation in her bilateral axilla.  Irritation from skin rubbing and I prescribed Lotrisone cream.  She has no axillary adenopathy on exam and I reviewed with her that at this point I am not concerned about breast cancer recurrence.  She will use this cream and let us know if it improves.  Since she is undergoing surgery next week I have a low threshold to prescribe antibiotics before hand.  Will call us on Thursday and let us know how the skin area is doing.  Her most recent Signatera was negative.  Will continue on letrozole daily along with Zoladex every 4 weeks.  Follow-up with Dr. Pamelia Hoit  is set to occur in August 2024.

## 2023-01-22 NOTE — Progress Notes (Signed)
[facility] Cancer Follow up:    Marcelle Overlie, MD [address] [place] [address]   DIAGNOSIS:  Cancer Staging  Malignant neoplasm of upper-outer quadrant of right breast in female, estrogen receptor positive Staging form: Breast, AJCC 8th Edition - Clinical: Stage IB (cT2, cN1, cM0, G2, ER+, PR+, HER2+) - Unsigned Histologic grading system: 3 grade system   SUMMARY OF ONCOLOGIC HISTORY: Oncology History  Malignant neoplasm of upper-outer quadrant of right breast in female, estrogen receptor positive  06/23/2021 Initial Diagnosis   Palpable lump in the right breast. Diagnostic mammogram and [place]: a 3.8 cm mass at the retroareolar right breast, 1 prominent right axillary lymph node with a 4 mm cortex, 1 other which is borderline measuring 3 mm. 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  3 o'clock position: Biopsy fibroadenoma   07/12/2021 -  Neo-Adjuvant Chemotherapy   Neoadjuvant chemotherapy with TCH Perjeta 6 cycles followed by Herceptin Perjeta maintenance versus Kadcyla maintenance (based on response to neoadjuvant chemo) for 1 year   07/28/2021 Genetic Testing   Negative hereditary cancer genetic testing: no pathogenic variants detected in [facility] CancerNext-Expanded +RNAinsight Panel.  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  The report date is July 28, 2021.   The CancerNext-Expanded gene panel offered by [facility] and includes sequencing, rearrangement, and RNA analysis for the following 77 genes: AIP, ALK, APC, ATM, AXIN2, BAP1, BARD1, BLM, BMPR1A, BRCA1, BRCA2, BRIP1, CDC73, CDH1, CDK4, CDKN1B, CDKN2A, CHEK2, CTNNA1, DICER1, FANCC, FH, FLCN, GALNT12, KIF1B, LZTR1, MAX, MEN1, MET, MLH1, MSH2, MSH3, MSH6, MUTYH, NBN, NF1, NF2, NTHL1, PALB2, PHOX2B, PMS2, POT1, PRKAR1A, PTCH1, PTEN, RAD51C, RAD51D, RB1, RECQL, RET, SDHA, SDHAF2, SDHB,  SDHC, SDHD, SMAD4, SMARCA4, SMARCB1, SMARCE1, STK11, SUFU, TMEM127, TP53, TSC1, TSC2, VHL and XRCC2 (sequencing and deletion/duplication); EGFR, EGLN1, HOXB13, KIT, MITF, PDGFRA, POLD1, and POLE (sequencing only); EPCAM and GREM1 (deletion/duplication only).    12/06/2021 Surgery   Bilateral Mastectomies: Left mastectomy: Benign Right mastectomy: Multifocal ILC 1.8 cm, 0.9 cm, margins negative, 1/2 lymph nodes positive, ER 50%, PR 30%, HER2 1+ IHC   12/26/2021 - 12/29/2021 Chemotherapy   Patient is on Treatment Plan : BREAST ADO-Trastuzumab Emtansine (Kadcyla) q21d       CURRENT THERAPY: Zoladex/Letrozole  INTERVAL HISTORY: Jenna Cross 37 y.o. female returns for f/u for evaluation of underarm irritation.  This occurs intermittently with no specific pattern however she notes that it is worsened slightly since beginning Pilates twice a week.  She notes that is also worse on the days she does not use deodorant.  She describes it as a redness with accompanying itching.  She does not shave her underarms as she underwent laser hair removal several years ago.  She has no other pain or concerns today and notes she will undergo breast reconstruction next Thursday.   Patient Active Problem List   Diagnosis Date Noted   Breast asymmetry following reconstructive surgery 06/19/2022   Acquired absence of breast and absent nipple, bilateral 06/19/2022   Port-A-Cath in place 01/26/2022   Infection of right breast 01/22/2022   History of DVT (deep vein thrombosis) 01/22/2022   Deep venous thrombosis 09/26/2021   Genetic testing 08/01/2021   Family history of prostate cancer 07/17/2021   Malignant neoplasm of upper-outer quadrant of right breast in female, estrogen receptor positive 06/27/2021   Breast tenderness in female 05/26/2021   Breast mass 05/26/2021   History of preterm premature  rupture of membranes (PPROM) 01/31/2021   Preterm premature rupture of membranes (PPROM) with unknown onset of  labor 01/27/2021   Vacuum extractor delivery, delivered 01/04/2020   Pregnancy 01/03/2020   Encounter for counseling 08/29/2018   Paresthesia 04/05/2016    is allergic to wound dressings.  MEDICAL HISTORY: Past Medical History:  Diagnosis Date   Anemia    pregnancy related   Anxiety    Anxiety    no medication   Cancer    breast   COVID 09/2020   mild case   Family history of prostate cancer 07/17/2021   History of DVT (deep vein thrombosis)    in her neck   History of radiation therapy    Right breast 02/22/22-04/13/22- Dr. Antony Blackbird    SURGICAL HISTORY: Past Surgical History:  Procedure Laterality Date   BREAST IMPLANT EXCHANGE Right 01/25/2022   Procedure: BREAST IMPLANT REMOVAL;  Surgeon: Peggye Form, DO;  Location: [facility] OR;  Service: Plastics;  Laterality: Right;  1 hour   BREAST RECONSTRUCTION WITH PLACEMENT OF TISSUE EXPANDER AND FLEX HD (ACELLULAR HYDRATED DERMIS) Bilateral 12/06/2021   Procedure: BREAST RECONSTRUCTION WITH PLACEMENT OF TISSUE EXPANDER AND FLEX HD (ACELLULAR HYDRATED DERMIS);  Surgeon: Peggye Form, DO;  Location: [facility];  Service: Plastics;  Laterality: Bilateral;   BREAST SURGERY     cyst removal from right breast    CESAREAN SECTION N/A 01/31/2021   Procedure: CESAREAN SECTION;  Surgeon: Marcelle Overlie, MD;  Location: [facility]S;  Service: Obstetrics;  Laterality: N/A;   CHG RADIOLOGICAL GUIDANCE PRQ DRG W/PLMT CATH RS&I  01/23/2022   excision of fibroadenoma of breast     PORT-A-CATH REMOVAL Left 12/03/2022   Procedure: REMOVAL PORT-A-CATH;  Surgeon: Emelia Loron, MD;  Location: [facility];  Service: General;  Laterality: Left;   PORTACATH PLACEMENT N/A 07/11/2021   Procedure: INSERTION PORT-A-CATH;  Surgeon: Emelia Loron, MD;  Location: [facility];  Service: General;  Laterality: N/A;   RADIOACTIVE SEED GUIDED AXILLARY SENTINEL LYMPH NODE Right 12/06/2021   Procedure:  RADIOACTIVE SEED GUIDED RIGHT AXILLARY SENTINEL LYMPH NODE EXCISION;  Surgeon: Emelia Loron, MD;  Location: [facility];  Service: General;  Laterality: Right;   REMOVAL OF BILATERAL TISSUE EXPANDERS WITH PLACEMENT OF BILATERAL BREAST IMPLANTS Bilateral 01/01/2022   Procedure: REMOVAL OF BILATERAL TISSUE EXPANDERS WITH PLACEMENT OF BILATERAL BREAST IMPLANTS;  Surgeon: Peggye Form, DO;  Location: [facility];  Service: Plastics;  Laterality: Bilateral;   SENTINEL NODE BIOPSY Right 12/06/2021   Procedure: RIGHT AXILLARY SENTINEL NODE BIOPSY;  Surgeon: Emelia Loron, MD;  Location: [facility];  Service: General;  Laterality: Right;   TONSILLECTOMY     and adenoids   TONSILLECTOMY AND ADENOIDECTOMY     TOTAL MASTECTOMY Bilateral 12/06/2021   Procedure: BILATERAL SKIN SPARING TOTAL MASTECTOMY;  Surgeon: Emelia Loron, MD;  Location: [facility];  Service: General;  Laterality: Bilateral;   WISDOM TOOTH EXTRACTION      SOCIAL HISTORY: Social History   Socioeconomic History   Marital status: Married    Spouse name: Not on file   Number of children: 0   Years of education: College   Highest education level: Not on file  Occupational History   Not on file  Tobacco Use   Smoking status: Never   Smokeless tobacco: Never  Vaping Use   Vaping Use: Never used  Substance and Sexual Activity   Alcohol use: Not Currently  Drug use: Never   Sexual activity: Yes    Birth control/protection: None  Other Topics Concern   Not on file  Social History Narrative   ** Merged History Encounter **       Lives at home Right handed Drinks 1 cup of coffee daily and a couple of sodas per month   Social Determinants of Health   Financial Resource Strain: Not on file  Food Insecurity: Not on file  Transportation Needs: Not on file  Physical Activity: Not on file  Stress: Not on file  Social Connections: Not on file  Intimate Partner Violence:  Not on file    FAMILY HISTORY: Family History  Problem Relation Age of Onset   Migraines Mother    Prostate cancer Maternal Grandfather        dx after 14   Lymphoma Paternal Aunt        dx late 52s   Colon cancer Neg Hx    Esophageal cancer Neg Hx    Pancreatic cancer Neg Hx    Stomach cancer Neg Hx     Review of Systems  Constitutional:  Negative for appetite change, chills, fatigue, fever and unexpected weight change.  HENT:   Negative for hearing loss, lump/mass and trouble swallowing.   Eyes:  Negative for eye problems and icterus.  Respiratory:  Negative for chest tightness, cough and shortness of breath.   Cardiovascular:  Negative for chest pain, leg swelling and palpitations.  Gastrointestinal:  Negative for abdominal distention, abdominal pain, constipation, diarrhea, nausea and vomiting.  Endocrine: Negative for hot flashes.  Genitourinary:  Negative for difficulty urinating.   Musculoskeletal:  Negative for arthralgias.  Skin:  Positive for itching and rash (Per interval history).  Neurological:  Negative for dizziness, extremity weakness, headaches and numbness.  Hematological:  Negative for adenopathy. Does not bruise/bleed easily.  Psychiatric/Behavioral:  Negative for depression. The patient is not nervous/anxious.       PHYSICAL EXAMINATION   Onc Performance Status - 01/22/23 1142       ECOG Perf Status   ECOG Perf Status Fully active, able to carry on all pre-disease performance without restriction      KPS SCALE   KPS % SCORE Normal, no compliants, no evidence of disease             Vitals:   01/22/23 1138  BP: (!) 119/54  Pulse: 96  Resp: 16  Temp: 98.1 F (36.7 C)  SpO2: 99%    Physical Exam Constitutional:      General: She is not in acute distress.    Appearance: Normal appearance. She is not toxic-appearing.  HENT:     Head: Normocephalic and atraumatic.  Eyes:     General: No scleral icterus. Cardiovascular:     Rate and  Rhythm: Normal rate and regular rhythm.     Pulses: Normal pulses.     Heart sounds: Normal heart sounds.  Pulmonary:     Effort: Pulmonary effort is normal.     Breath sounds: Normal breath sounds.  Abdominal:     General: Abdomen is flat. Bowel sounds are normal. There is no distension.     Palpations: Abdomen is soft.     Tenderness: There is no abdominal tenderness.  Musculoskeletal:        General: No swelling.     Cervical back: Neck supple.  Lymphadenopathy:     Cervical: No cervical adenopathy.  Skin:    General: Skin is warm and dry.  Findings: No rash.     Comments: Bilateral axilla with posterior erythema noted no clear indication that the rash is fungal however it is a flattened area on her skin.  There is no part of the rash that is at her hair follicle.    Neurological:     General: No focal deficit present.     Mental Status: She is alert.  Psychiatric:        Mood and Affect: Mood normal.        Behavior: Behavior normal.     LABORATORY DATA:  CBC    Component Value Date/Time   WBC 4.6 01/09/2023 0909   WBC 8.9 01/24/2022 0040   RBC 4.21 01/09/2023 0909   HGB 13.3 01/09/2023 0909   HCT 38.8 01/09/2023 0909   PLT 199 01/09/2023 0909   MCV 92.2 01/09/2023 0909   MCH 31.6 01/09/2023 0909   MCHC 34.3 01/09/2023 0909   RDW 13.1 01/09/2023 0909   LYMPHSABS 1.7 01/09/2023 0909   MONOABS 0.4 01/09/2023 0909   EOSABS 0.3 01/09/2023 0909   BASOSABS 0.1 01/09/2023 0909    CMP     Component Value Date/Time   NA 139 01/09/2023 0909   NA 140 04/05/2016 0932   K 4.0 01/09/2023 0909   CL 108 01/09/2023 0909   CO2 25 01/09/2023 0909   GLUCOSE 91 01/09/2023 0909   BUN 11 01/09/2023 0909   BUN 12 04/05/2016 0932   CREATININE 0.71 01/09/2023 0909   CALCIUM 9.4 01/09/2023 0909   PROT 7.0 01/09/2023 0909   PROT 7.2 04/05/2016 0932   ALBUMIN 4.0 01/09/2023 0909   ALBUMIN 4.4 04/05/2016 0932   AST 21 01/09/2023 0909   ALT 14 01/09/2023 0909   ALKPHOS 123  01/09/2023 0909   BILITOT 0.5 01/09/2023 0909   GFRNONAA >60 01/09/2023 0909   GFRAA 109 04/05/2016 0932       ASSESSMENT and THERAPY PLAN:   Malignant neoplasm of upper-outer quadrant of right breast in female, estrogen receptor positive (HCC) Palpable lump in the right breast. Diagnostic mammogram and [place]: a 3.8 cm mass at the retroareolar right breast, 1 prominent right axillary lymph node with a 4 mm cortex, 1 other which is borderline measuring 3 mm. 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  3 o'clock position: Biopsy fibroadenoma   Treatment plan: 1. Neoadjuvant chemotherapy with TCH Perjeta 6 cycles followed by Herceptin Perjeta maintenance versus Kadcyla maintenance (based on response to neoadjuvant chemo) for 1 year 2. Followed by bilateral mastectomies with sentinel lymph node study 3. Followed by adjuvant radiation therapy  4.  Followed by antiestrogen therapy 5.  Followed by neratinib   Breast MRI 06/28/2021: Confluent mass right breast 11.1 x 7.8 x 9.5 cm, 1 enlarged lymph node and second borderline lymph node in the right axilla Brain MRI 07/03/2021: No evidence of malignancy. CT CAP and bone scan: 07/10/2021: Numerous enhancing right breast masses and right axillary lymph nodes, nonspecific small pulmonary nodules, bone scan: No bone metastases URCC nausea study, ------------------------------------------------------------------------------------------------------------------------------------ Current Treatment: Zoladex and letrozole  She is being seen for skin irritation in her bilateral axilla.  Irritation from skin rubbing and I prescribed Lotrisone cream.  She has no axillary adenopathy on exam and I reviewed with her that at this point I am not concerned about breast cancer recurrence.  She will use this cream and let us know if it improves.  Since she is undergoing surgery next week I have  a  low threshold to prescribe antibiotics before hand.  Will call us on Thursday and let us know how the skin area is doing.  Her most recent Signatera was negative.  Will continue on letrozole daily along with Zoladex every 4 weeks.  Follow-up with Dr. Pamelia Hoit is set to occur in August 2024.   All questions were answered. The patient knows to call the clinic with any problems, questions or concerns. We can certainly see the patient much sooner if necessary.  Total encounter time:20 minutes*in face-to-face visit time, chart review, lab review, care coordination, order entry, and documentation of the encounter time.  Lillard Anes, NP 01/22/23 12:18 PM Medical Oncology and Hematology [facility] [address] [place], [place] [address] Tel. [phone]    Fax. [phone]  *Total Encounter Time as defined by the Centers for Medicare and Medicaid Services includes, in addition to the face-to-face time of a patient visit (documented in the note above) non-face-to-face time: obtaining and reviewing outside history, ordering and reviewing medications, tests or procedures, care coordination (communications with other health care professionals or caregivers) and documentation in the medical record.

## 2023-01-29 ENCOUNTER — Encounter: Payer: Self-pay | Admitting: Hematology and Oncology

## 2023-01-30 ENCOUNTER — Other Ambulatory Visit: Payer: Self-pay | Admitting: Physician Assistant

## 2023-01-30 ENCOUNTER — Inpatient Hospital Stay (HOSPITAL_BASED_OUTPATIENT_CLINIC_OR_DEPARTMENT_OTHER): Payer: 59 | Admitting: Physician Assistant

## 2023-01-30 ENCOUNTER — Other Ambulatory Visit: Payer: Self-pay

## 2023-01-30 ENCOUNTER — Telehealth: Payer: Self-pay

## 2023-01-30 ENCOUNTER — Inpatient Hospital Stay: Payer: 59

## 2023-01-30 ENCOUNTER — Ambulatory Visit (HOSPITAL_BASED_OUTPATIENT_CLINIC_OR_DEPARTMENT_OTHER)
Admission: RE | Admit: 2023-01-30 | Discharge: 2023-01-30 | Disposition: A | Discharge status: Home / Self Care | Payer: 59 | Source: Ambulatory Visit | Attending: Physician Assistant | Admitting: Physician Assistant

## 2023-01-30 VITALS — BP 122/61 | HR 102 | Temp 98.2°F | Resp 16

## 2023-01-30 VITALS — BP 115/63 | HR 98 | Temp 98.5°F | Resp 16 | Wt 135.3 lb

## 2023-01-30 DIAGNOSIS — Z5111 Encounter for antineoplastic chemotherapy: Secondary | ICD-10-CM | POA: Diagnosis not present

## 2023-01-30 DIAGNOSIS — Z170 Estrogen receptor positive status [ER+]: Secondary | ICD-10-CM

## 2023-01-30 DIAGNOSIS — C50411 Malignant neoplasm of upper-outer quadrant of right female breast: Secondary | ICD-10-CM | POA: Insufficient documentation

## 2023-01-30 DIAGNOSIS — M79601 Pain in right arm: Secondary | ICD-10-CM

## 2023-01-30 DIAGNOSIS — Z95828 Presence of other vascular implants and grafts: Secondary | ICD-10-CM

## 2023-01-30 MED ORDER — GOSERELIN ACETATE 3.6 MG ~~LOC~~ IMPL
3.6000 mg | DRUG_IMPLANT | Freq: Once | SUBCUTANEOUS | Status: AC
  Administered 2023-01-30: 3.6 mg via SUBCUTANEOUS
  Filled 2023-01-30: qty 3.6

## 2023-01-30 NOTE — Progress Notes (Signed)
Order for DVT study 

## 2023-01-30 NOTE — Telephone Encounter (Signed)
Patient called complaining of R arm pain at elbow; tender to touch/movement and "tightness." Informed Gudena's desk nurse, Adelina Mings, who contacted [facility]. Patient will be seen in [facility] this afternoon. Patient informed of plan.

## 2023-01-30 NOTE — Progress Notes (Signed)
Symptom Management Consult Note [facility]    Patient Care Team: Marcelle Overlie, MD as PCP - General (Obstetrics and Gynecology) Marcelle Overlie, MD (Obstetrics and Gynecology) Pershing Proud, RN as Oncology Nurse Navigator Donnelly Angelica, RN as Oncology Nurse Navigator Serena Croissant, MD as Consulting Physician (Hematology and Oncology) Antony Blackbird, MD as Consulting Physician (Radiation Oncology) Emelia Loron, MD as Consulting Physician (General Surgery)    Name / MRN / DOB: Jenna Cross  [id number]  1986-01-19   Date of visit: 01/30/2023   Chief Complaint/Reason for visit: right arm pain   Current Therapy: Zoladex and Letrozole   ASSESSMENT & PLAN: Patient is a 37 y.o. female  with oncologic history of malignant neoplasm of upper-outer quadrant of right breast, estrogen receptor positive followed by Dr. Pamelia Hoit.  I have viewed most recent oncology note and lab work.    #Malignant neoplasm of upper-outer quadrant of right breast, estrogen receptor positive  - Patient is scheduled for right breast reconstruction with latissimus drosi flap - Next appointment with oncologist is 05/24/23   #Right arm pain - Unremarkable exam. Neurovascularly intact.  - DVT study is negative. Patient has been doing Pilates more frequently and was moving some furniture this weekend that have caused her pain. - No indication for further work up at this time. Discussed with patient she can follow up with lymphedema clinic after recovering from her surgery if symptoms persist. - Patient agreeable with plan of care.      Heme/Onc History: Oncology History  Malignant neoplasm of upper-outer quadrant of right breast in female, estrogen receptor positive  06/23/2021 Initial Diagnosis   Palpable lump in the right breast. Diagnostic mammogram and [place]: a 3.8 cm mass at the retroareolar right breast, 1 prominent right axillary lymph node with a 4 mm cortex, 1 other  which is borderline measuring 3 mm. 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  3 o'clock position: Biopsy fibroadenoma   07/12/2021 -  Neo-Adjuvant Chemotherapy   Neoadjuvant chemotherapy with TCH Perjeta 6 cycles followed by Herceptin Perjeta maintenance versus Kadcyla maintenance (based on response to neoadjuvant chemo) for 1 year   07/28/2021 Genetic Testing   Negative hereditary cancer genetic testing: no pathogenic variants detected in [facility] CancerNext-Expanded +RNAinsight Panel.  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  The report date is July 28, 2021.   The CancerNext-Expanded gene panel offered by [facility] and includes sequencing, rearrangement, and RNA analysis for the following 77 genes: AIP, ALK, APC, ATM, AXIN2, BAP1, BARD1, BLM, BMPR1A, BRCA1, BRCA2, BRIP1, CDC73, CDH1, CDK4, CDKN1B, CDKN2A, CHEK2, CTNNA1, DICER1, FANCC, FH, FLCN, GALNT12, KIF1B, LZTR1, MAX, MEN1, MET, MLH1, MSH2, MSH3, MSH6, MUTYH, NBN, NF1, NF2, NTHL1, PALB2, PHOX2B, PMS2, POT1, PRKAR1A, PTCH1, PTEN, RAD51C, RAD51D, RB1, RECQL, RET, SDHA, SDHAF2, SDHB, SDHC, SDHD, SMAD4, SMARCA4, SMARCB1, SMARCE1, STK11, SUFU, TMEM127, TP53, TSC1, TSC2, VHL and XRCC2 (sequencing and deletion/duplication); EGFR, EGLN1, HOXB13, KIT, MITF, PDGFRA, POLD1, and POLE (sequencing only); EPCAM and GREM1 (deletion/duplication only).    12/06/2021 Surgery   Bilateral Mastectomies: Left mastectomy: Benign Right mastectomy: Multifocal ILC 1.8 cm, 0.9 cm, margins negative, 1/2 lymph nodes positive, ER 50%, PR 30%, HER2 1+ IHC   12/26/2021 - 12/29/2021 Chemotherapy   Patient is on Treatment Plan : BREAST ADO-Trastuzumab Emtansine (Kadcyla) q21d         Interval history-: Jenna Cross is a 37 y.o. female with oncologic history as  above presenting to [facility] today with chief complaint of arm pain x 1 day.  She presents  unaccompanied to clinic.  Patient states when she woke up yesterday morning she had tenderness on her right arm near her elbow.  She states the pain is constant and 5 out of 10 in severity.  The pain is worse when she extends or raise her right arm.  She denies any swelling, numbness or tingling.  She cannot think of an obvious injury however does admit to moving some furniture around the day before symptoms started.  She also has been consistently doing Pilates for the last month.  Patient is scheduled for her reconstruction surgery tomorrow and wants to make sure she does not have a blood clot that would cause any delay in her procedure.  Patient has a history of blood clot in the past related to her port.  Port was removed in February 2024 and anticoagulation was stopped at that time.  ROS  All other systems are reviewed and are negative for acute change except as noted in the HPI.    Allergies  Allergen Reactions   Wound Dressings Other (See Comments)    Honey comb dressing causes redness and inflammation      Past Medical History:  Diagnosis Date   Anemia    pregnancy related   Anxiety    Anxiety    no medication   Cancer    breast   COVID 09/2020   mild case   Family history of prostate cancer 07/17/2021   History of DVT (deep vein thrombosis)    in her neck   History of radiation therapy    Right breast 02/22/22-04/13/22- Dr. Antony Blackbird     Past Surgical History:  Procedure Laterality Date   BREAST IMPLANT EXCHANGE Right 01/25/2022   Procedure: BREAST IMPLANT REMOVAL;  Surgeon: Peggye Form, DO;  Location: [facility] OR;  Service: Plastics;  Laterality: Right;  1 hour   BREAST RECONSTRUCTION WITH PLACEMENT OF TISSUE EXPANDER AND FLEX HD (ACELLULAR HYDRATED DERMIS) Bilateral 12/06/2021   Procedure: BREAST RECONSTRUCTION WITH PLACEMENT OF TISSUE EXPANDER AND FLEX HD (ACELLULAR HYDRATED DERMIS);  Surgeon: Peggye Form, DO;  Location: [facility];   Service: Plastics;  Laterality: Bilateral;   BREAST SURGERY     cyst removal from right breast    CESAREAN SECTION N/A 01/31/2021   Procedure: CESAREAN SECTION;  Surgeon: Marcelle Overlie, MD;  Location: [facility] LD ORS;  Service: Obstetrics;  Laterality: N/A;   CHG RADIOLOGICAL GUIDANCE PRQ DRG W/PLMT CATH RS&I  01/23/2022   excision of fibroadenoma of breast     PORT-A-CATH REMOVAL Left 12/03/2022   Procedure: REMOVAL PORT-A-CATH;  Surgeon: Emelia Loron, MD;  Location: [facility];  Service: General;  Laterality: Left;   PORTACATH PLACEMENT N/A 07/11/2021   Procedure: INSERTION PORT-A-CATH;  Surgeon: Emelia Loron, MD;  Location: [facility];  Service: General;  Laterality: N/A;   RADIOACTIVE SEED GUIDED AXILLARY SENTINEL LYMPH NODE Right 12/06/2021   Procedure: RADIOACTIVE SEED GUIDED RIGHT AXILLARY SENTINEL LYMPH NODE EXCISION;  Surgeon: Emelia Loron, MD;  Location: [facility];  Service: General;  Laterality: Right;   REMOVAL OF BILATERAL TISSUE EXPANDERS WITH PLACEMENT OF BILATERAL BREAST IMPLANTS Bilateral 01/01/2022   Procedure: REMOVAL OF BILATERAL TISSUE EXPANDERS WITH PLACEMENT OF BILATERAL BREAST IMPLANTS;  Surgeon: Peggye Form, DO;  Location: [facility];  Service: Plastics;  Laterality: Bilateral;   SENTINEL NODE BIOPSY Right 12/06/2021  Procedure: RIGHT AXILLARY SENTINEL NODE BIOPSY;  Surgeon: Emelia Loron, MD;  Location: [facility];  Service: General;  Laterality: Right;   TONSILLECTOMY     and adenoids   TONSILLECTOMY AND ADENOIDECTOMY     TOTAL MASTECTOMY Bilateral 12/06/2021   Procedure: BILATERAL SKIN SPARING TOTAL MASTECTOMY;  Surgeon: Emelia Loron, MD;  Location:  SUR[facility]ERY [facility];  Service: General;  Laterality: Bilateral;   WISDOM TOOTH EXTRACTION      Social History   Socioeconomic History   Marital status: Married    Spouse name: Not on file   Number of children: 0   Years of  education: College   Highest education level: Not on file  Occupational History   Not on file  Tobacco Use   Smoking status: Never   Smokeless tobacco: Never  Vaping Use   Vaping Use: Never used  Substance and Sexual Activity   Alcohol use: Not Currently   Drug use: Never   Sexual activity: Yes    Birth control/protection: None  Other Topics Concern   Not on file  Social History Narrative   ** Merged History Encounter **       Lives at home Right handed Drinks 1 cup of coffee daily and a couple of sodas per month   Social Determinants of Health   Financial Resource Strain: Not on file  Food Insecurity: Not on file  Transportation Needs: Not on file  Physical Activity: Not on file  Stress: Not on file  Social Connections: Not on file  Intimate Partner Violence: Not on file    Family History  Problem Relation Age of Onset   Migraines Mother    Prostate cancer Maternal Grandfather        dx after 11   Lymphoma Paternal Aunt        dx late 76s   Colon cancer Neg Hx    Esophageal cancer Neg Hx    Pancreatic cancer Neg Hx    Stomach cancer Neg Hx      Current Outpatient Medications:    acetaminophen (TYLENOL) 500 MG tablet, Take 1,000 mg by mouth every 6 (six) hours as needed for mild pain or headache., Disp: , Rfl:    ALPRAZolam (XANAX) 0.5 MG tablet, Take 0.5 mg by mouth 3 (three) times daily as needed for anxiety., Disp: , Rfl:    clindamycin (CLINDAGEL) 1 % gel, Apply topically 2 (two) times daily. Apply to affect area two times daily, Disp: 30 g, Rfl: 1   clotrimazole-betamethasone (LOTRISONE) cream, Apply 1 Application topically 2 (two) times daily., Disp: 30 g, Rfl: 0   diphenhydrAMINE (BENADRYL) 25 mg capsule, Take 25 mg by mouth every 6 (six) hours as needed for itching or allergies., Disp: , Rfl:    diphenoxylate-atropine (LOMOTIL) 2.5-0.025 MG tablet, Take 1 tablet by mouth 3 (three) times daily as needed for diarrhea or loose stools., Disp: 30 tablet, Rfl:  3   Goserelin Acetate (ZOLADEX [place]), , Disp: , Rfl:    hydrocortisone cream 0.5 %, Apply 1 Application topically as needed for itching., Disp: , Rfl:    letrozole (FEMARA) 2.5 MG tablet, Take 1 tablet (2.5 mg total) by mouth daily., Disp: 90 tablet, Rfl: 3   lidocaine-prilocaine (EMLA) cream, lidocaine-prilocaine 2.5 %-2.5 % topical cream  APPLY TO AFFECTED AREA ONCE AS DIRECTED, Disp: , Rfl:    loratadine (CLARITIN) 10 MG tablet, Take 10 mg by mouth daily as needed for allergies., Disp: , Rfl:    LORazepam (  ATIVAN) 0.5 MG tablet, TAKE 1 TABLET (0.5 MG TOTAL) BY MOUTH AT BEDTIME AS NEEDED (NAUSEA OR VOMITING)., Disp: , Rfl:    nystatin-triamcinolone (MYCOLOG II) cream, Apply 1 Application topically 4 (four) times daily., Disp: , Rfl:    ondansetron (ZOFRAN) 8 MG tablet, TAKE 1 TABLET BY MOUTH EVERY 8 HOURS AS NEEDED FOR NAUSEA, Disp: 30 tablet, Rfl: 3   polyethylene glycol (MIRALAX / GLYCOLAX) 17 g packet, Take 17 g by mouth daily as needed for mild constipation., Disp: , Rfl:    valACYclovir (VALTREX) 500 MG tablet, Take 500 mg by mouth every evening., Disp: , Rfl:    venlafaxine XR (EFFEXOR-XR) 37.5 MG 24 hr capsule, Take 1 capsule (37.5 mg total) by mouth daily with breakfast., Disp: 90 capsule, Rfl: 1  PHYSICAL EXAM: ECOG FS:1 - Symptomatic but completely ambulatory    Vitals:   01/30/23 1305  BP: 115/63  Pulse: 98  Resp: 16  Temp: 98.5 F (36.9 C)  TempSrc: Oral  SpO2: 100%  Weight: 135 lb 4.8 oz (61.4 kg)   Physical Exam Vitals and nursing note reviewed.  Constitutional:      Appearance: She is not ill-appearing or toxic-appearing.  HENT:     Head: Normocephalic.  Eyes:     Conjunctiva/sclera: Conjunctivae normal.  Cardiovascular:     Rate and Rhythm: Normal rate.     Pulses:          Radial pulses are 2+ on the right side.  Pulmonary:     Effort: Pulmonary effort is normal.  Abdominal:     General: There is no distension.  Musculoskeletal:     Right shoulder:  Normal.     Right wrist: Normal.     Cervical back: Normal range of motion.     Comments: Tenderness to palpation of anterior medial aspect of right elbow.  No bony tenderness. No overlying skin changes. Full range of motion of right upper extremity. Compartments are soft in right upper extremity. Strong grip strength.   Skin:    General: Skin is warm and dry.     Capillary Refill: Capillary refill takes less than 2 seconds.  Neurological:     Mental Status: She is alert.        LABORATORY DATA: I have reviewed the data as listed    Latest Ref Rng & Units 01/09/2023    9:09 AM 11/23/2022    8:25 AM 11/01/2022    8:49 AM  CBC  WBC 4.0 - 10.5 K/uL 4.6  6.3  6.2   Hemoglobin 12.0 - 15.0 g/dL 16.1  09.6  04.5   Hematocrit 36.0 - 46.0 % 38.8  37.3  37.6   Platelets 150 - 400 K/uL 199  193  195         Latest Ref Rng & Units 01/09/2023    9:09 AM 11/23/2022    8:25 AM 11/01/2022    8:49 AM  CMP  Glucose 70 - 99 mg/dL 91  92  80   BUN 6 - 20 mg/dL 11  12  14    Creatinine 0.44 - 1.00 mg/dL 4.09  8.11  9.14   Sodium 135 - 145 mmol/L 139  140  141   Potassium 3.5 - 5.1 mmol/L 4.0  3.5  3.8   Chloride 98 - 111 mmol/L 108  106  107   CO2 22 - 32 mmol/L 25  27  28    Calcium 8.9 - 10.3 mg/dL 9.4  9.2  9.3  Total Protein 6.5 - 8.1 g/dL 7.0  7.0  7.3   Total Bilirubin 0.3 - 1.2 mg/dL 0.5  0.4  0.7   Alkaline Phos 38 - 126 U/L 123  152  143   AST 15 - 41 U/L 21  26  32   ALT 0 - 44 U/L 14  23  25         RADIOGRAPHIC STUDIES (from last 24 hours if applicable) I have personally reviewed the radiological images as listed and agreed with the findings in the report. VAS [place] UPPER EXTREMITY VENOUS DUPLEX  Result Date: 01/30/2023 UPPER VENOUS STUDY  Patient Name:  DAVINA HOWLETT  Date of Exam:   01/30/2023 Medical Rec #: [id number]           Accession #:    [id number] Date of Birth: 1986-02-09          Patient Gender: F Patient Age:   59 years Exam [facility]  Procedure:      VAS [place] UPPER EXTREMITY VENOUS DUPLEX Referring Phys: Namon Cirri --------------------------------------------------------------------------------  Indications: Pain Risk Factors: DVT LUE 09/2021 HX of breast cancer, hormone based chemotherapy. Comparison Study: No previous RUE venous exams Performing Technologist: Jody Hill RVT, RDMS  Examination Guidelines: A complete evaluation includes B-mode imaging, spectral Doppler, color Doppler, and power Doppler as needed of all accessible portions of each vessel. Bilateral testing is considered an integral part of a complete examination. Limited examinations for reoccurring indications may be performed as noted.  Right Findings: +----------+------------+---------+-----------+----------+-------+ RIGHT     CompressiblePhasicitySpontaneousPropertiesSummary +----------+------------+---------+-----------+----------+-------+ IJV           Full       Yes       Yes                      +----------+------------+---------+-----------+----------+-------+ Subclavian    Full       Yes       Yes                      +----------+------------+---------+-----------+----------+-------+ Axillary      Full       Yes       Yes                      +----------+------------+---------+-----------+----------+-------+ Brachial      Full       Yes       Yes                      +----------+------------+---------+-----------+----------+-------+ Radial        Full                                          +----------+------------+---------+-----------+----------+-------+ Ulnar         Full                                          +----------+------------+---------+-----------+----------+-------+ Cephalic      Full                                          +----------+------------+---------+-----------+----------+-------+ Basilic  Full       Yes       Yes                       +----------+------------+---------+-----------+----------+-------+  Left Findings: +----------+------------+---------+-----------+----------+-------+ LEFT      CompressiblePhasicitySpontaneousPropertiesSummary +----------+------------+---------+-----------+----------+-------+ Subclavian    Full       Yes       Yes                      +----------+------------+---------+-----------+----------+-------+  Summary:  Right: No evidence of deep vein thrombosis in the upper extremity. No evidence of superficial vein thrombosis in the upper extremity.  Left: No evidence of thrombosis in the subclavian.  *See table(s) above for measurements and observations.  Diagnosing physician: Sherald Hess MD Electronically signed by Sherald Hess MD on 01/30/2023 at 4:25:15 PM.    Final         Visit Diagnosis: 1. Malignant neoplasm of upper-outer quadrant of right breast in female, estrogen receptor positive   2. Right arm pain      No orders of the defined types were placed in this encounter.   All questions were answered. The patient knows to call the clinic with any problems, questions or concerns. No barriers to learning was detected.  I have spent a total of 20 minutes minutes of face-to-face and non-face-to-face time, preparing to see the patient, obtaining and/or reviewing separately obtained history, performing a medically appropriate examination, counseling and educating the patient, ordering tests, documenting clinical information in the electronic health record.    Thank you for allowing me to participate in the care of this patient.    Shanon Ace, PA-C Department of Hematology/Oncology [facility] at [facility] Phone: [phone]  [phone]    01/30/2023 4:39 PM

## 2023-02-12 ENCOUNTER — Other Ambulatory Visit: Payer: Self-pay | Admitting: Hematology and Oncology

## 2023-02-27 ENCOUNTER — Inpatient Hospital Stay: Payer: 59 | Attending: Hematology and Oncology

## 2023-02-27 DIAGNOSIS — Z170 Estrogen receptor positive status [ER+]: Secondary | ICD-10-CM | POA: Insufficient documentation

## 2023-02-27 DIAGNOSIS — C50411 Malignant neoplasm of upper-outer quadrant of right female breast: Secondary | ICD-10-CM | POA: Insufficient documentation

## 2023-02-27 DIAGNOSIS — Z5111 Encounter for antineoplastic chemotherapy: Secondary | ICD-10-CM | POA: Insufficient documentation

## 2023-03-01 LAB — SIGNATERA
SIGNATERA MTM READOUT: 0 MTM/ml
SIGNATERA TEST RESULT: NEGATIVE

## 2023-03-06 ENCOUNTER — Encounter: Payer: Self-pay | Admitting: Hematology and Oncology

## 2023-03-07 ENCOUNTER — Inpatient Hospital Stay: Payer: 59

## 2023-03-07 VITALS — BP 110/69 | HR 97 | Temp 98.7°F | Resp 16

## 2023-03-07 DIAGNOSIS — Z170 Estrogen receptor positive status [ER+]: Secondary | ICD-10-CM | POA: Diagnosis not present

## 2023-03-07 DIAGNOSIS — C50411 Malignant neoplasm of upper-outer quadrant of right female breast: Secondary | ICD-10-CM | POA: Diagnosis present

## 2023-03-07 DIAGNOSIS — Z5111 Encounter for antineoplastic chemotherapy: Secondary | ICD-10-CM | POA: Diagnosis not present

## 2023-03-07 DIAGNOSIS — Z95828 Presence of other vascular implants and grafts: Secondary | ICD-10-CM

## 2023-03-07 MED ORDER — GOSERELIN ACETATE 3.6 MG ~~LOC~~ IMPL
3.6000 mg | DRUG_IMPLANT | Freq: Once | SUBCUTANEOUS | Status: AC
  Administered 2023-03-07: 3.6 mg via SUBCUTANEOUS
  Filled 2023-03-07: qty 3.6

## 2023-03-20 ENCOUNTER — Telehealth: Payer: Self-pay | Admitting: Hematology and Oncology

## 2023-03-20 NOTE — Telephone Encounter (Signed)
Spoke with patients confirming upcoming appointments  

## 2023-03-21 ENCOUNTER — Encounter: Payer: Self-pay | Admitting: Hematology and Oncology

## 2023-03-21 ENCOUNTER — Other Ambulatory Visit: Payer: Self-pay | Admitting: *Deleted

## 2023-03-21 DIAGNOSIS — R051 Acute cough: Secondary | ICD-10-CM

## 2023-03-21 DIAGNOSIS — Z170 Estrogen receptor positive status [ER+]: Secondary | ICD-10-CM

## 2023-03-21 NOTE — Progress Notes (Signed)
Received mychart message from pt with complaint of productive cough with mucus, body aches pains and chills on and off for 2 weeks.  Pt recently underwent breast reconstruction surgery and has been in contact with her children that are sick.  RN reviewed with [facility] who has ordered chest xray and f/u visit.  Orders placed, pt notified and verbalized understanding.

## 2023-03-25 ENCOUNTER — Inpatient Hospital Stay: Payer: 59 | Attending: Hematology and Oncology | Admitting: Physician Assistant

## 2023-03-25 ENCOUNTER — Ambulatory Visit (HOSPITAL_COMMUNITY)
Admission: RE | Admit: 2023-03-25 | Discharge: 2023-03-25 | Disposition: A | Discharge status: Home / Self Care | Payer: 59 | Source: Ambulatory Visit | Attending: Physician Assistant | Admitting: Physician Assistant

## 2023-03-25 ENCOUNTER — Other Ambulatory Visit: Payer: Self-pay

## 2023-03-25 VITALS — BP 109/82 | HR 94 | Temp 98.2°F | Resp 16 | Wt 137.6 lb

## 2023-03-25 DIAGNOSIS — C50411 Malignant neoplasm of upper-outer quadrant of right female breast: Secondary | ICD-10-CM | POA: Insufficient documentation

## 2023-03-25 DIAGNOSIS — J189 Pneumonia, unspecified organism: Secondary | ICD-10-CM

## 2023-03-25 DIAGNOSIS — Z5111 Encounter for antineoplastic chemotherapy: Secondary | ICD-10-CM | POA: Diagnosis present

## 2023-03-25 DIAGNOSIS — R519 Headache, unspecified: Secondary | ICD-10-CM | POA: Diagnosis not present

## 2023-03-25 DIAGNOSIS — R051 Acute cough: Secondary | ICD-10-CM | POA: Diagnosis present

## 2023-03-25 DIAGNOSIS — R059 Cough, unspecified: Secondary | ICD-10-CM | POA: Diagnosis present

## 2023-03-25 DIAGNOSIS — Z9013 Acquired absence of bilateral breasts and nipples: Secondary | ICD-10-CM | POA: Insufficient documentation

## 2023-03-25 DIAGNOSIS — Z170 Estrogen receptor positive status [ER+]: Secondary | ICD-10-CM | POA: Insufficient documentation

## 2023-03-25 DIAGNOSIS — Z807 Family history of other malignant neoplasms of lymphoid, hematopoietic and related tissues: Secondary | ICD-10-CM | POA: Insufficient documentation

## 2023-03-25 LAB — CMP (CANCER CENTER ONLY)
ALT: 11 U/L (ref 0–44)
AST: 14 U/L — ABNORMAL LOW (ref 15–41)
Albumin: 3.8 g/dL (ref 3.5–5.0)
Alkaline Phosphatase: 122 U/L (ref 38–126)
Anion gap: 8 (ref 5–15)
BUN: 10 mg/dL (ref 6–20)
CO2: 25 mmol/L (ref 22–32)
Calcium: 9.4 mg/dL (ref 8.9–10.3)
Chloride: 104 mmol/L (ref 98–111)
Creatinine: 0.59 mg/dL (ref 0.44–1.00)
GFR, Estimated: >60 mL/min (ref >60)
Glucose, Bld: 102 mg/dL — ABNORMAL HIGH (ref 70–99)
Potassium: 3.6 mmol/L (ref 3.5–5.1)
Sodium: 137 mmol/L (ref 135–145)
Total Bilirubin: 0.5 mg/dL (ref 0.3–1.2)
Total Protein: 6.8 g/dL (ref 6.5–8.1)

## 2023-03-25 LAB — CBC WITH DIFFERENTIAL (CANCER CENTER ONLY)
Abs Immature Granulocytes: 0.07 10*3/uL (ref 0.00–0.07)
Basophils Absolute: 0 10*3/uL (ref 0.0–0.1)
Basophils Relative: 0 %
Eosinophils Absolute: 0.2 10*3/uL (ref 0.0–0.5)
Eosinophils Relative: 2 %
HCT: 36.4 % (ref 36.0–46.0)
Hemoglobin: 12.8 g/dL (ref 12.0–15.0)
Immature Granulocytes: 1 %
Lymphocytes Relative: 13 %
Lymphs Abs: 1.7 10*3/uL (ref 0.7–4.0)
MCH: 31.8 pg (ref 26.0–34.0)
MCHC: 35.2 g/dL (ref 30.0–36.0)
MCV: 90.3 fL (ref 80.0–100.0)
Monocytes Absolute: 0.9 10*3/uL (ref 0.1–1.0)
Monocytes Relative: 7 %
Neutro Abs: 10.3 10*3/uL — ABNORMAL HIGH (ref 1.7–7.7)
Neutrophils Relative %: 77 %
Platelet Count: 263 10*3/uL (ref 150–400)
RBC: 4.03 MIL/uL (ref 3.87–5.11)
RDW: 12 % (ref 11.5–15.5)
WBC Count: 13.2 10*3/uL — ABNORMAL HIGH (ref 4.0–10.5)
nRBC: 0 % (ref 0.0–0.2)

## 2023-03-25 MED ORDER — BENZONATATE 100 MG PO CAPS: 100.0000 mg | ORAL_CAPSULE | Freq: Three times a day (TID) | ORAL | 0 refills | Status: DC | PRN

## 2023-03-25 MED ORDER — AZITHROMYCIN 250 MG PO TABS: ORAL_TABLET | ORAL | 0 refills | Status: AC

## 2023-03-25 MED ORDER — AMOXICILLIN-POT CLAVULANATE 875-125 MG PO TABS: 1.0000 | ORAL_TABLET | Freq: Two times a day (BID) | ORAL | 0 refills | Status: AC

## 2023-03-25 NOTE — Progress Notes (Signed)
Symptom Management Consult Note [facility]    Patient Care Team: Marcelle Overlie, MD as PCP - General (Obstetrics and Gynecology) Marcelle Overlie, MD (Obstetrics and Gynecology) Pershing Proud, RN as Oncology Nurse Navigator Donnelly Angelica, RN as Oncology Nurse Navigator Serena Croissant, MD as Consulting Physician (Hematology and Oncology) Antony Blackbird, MD as Consulting Physician (Radiation Oncology) Emelia Loron, MD as Consulting Physician (General Surgery)    Name / MRN / DOB: Jenna Cross  [id number]  07/10/1986   Date of visit: 03/25/2023   Chief Complaint/Reason for visit: cough   Current Therapy: Zoladex and letrozole   Last treatment:  Zoladex injection on 03/07/23   ASSESSMENT & PLAN: Patient is a 37 y.o. female  with oncologic history of malignant neoplasm of upper-outer quadrant of right breast in female, estrogen receptor positive  followed by Dr. Pamelia Hoit.  I have viewed most recent oncology note and lab work.    #Malignant neoplasm of upper-outer quadrant of right breast in female, estrogen receptor positive  - Next appointment with oncologist is 05/24/23   #Cough -Patient nontoxic-appearing, hemodynamically stable. -Lung exam with decreased sounds in right lung base.  Normal respiratory effort.  Patient ambulated in clinic without hypoxia. -Chest x-ray viewed by me shows concern for early pneumonia in right lower lobe.  I agree with radiologist impression. -CBC does have mild leukocytosis, 13.2.  CMP is overall unremarkable. -Will treat patient with Augmentin and Z-Pak  for CAP as well as providing her with an incentive spirometer.  Prescription also sent to the pharmacy for [facility] as well as coughing kept her awake last night.  She will only take this if needed. Patient is stable for outpatient treatment and agrees with plan of care.  Strict ED precautions discussed should symptoms worsen.     Heme/Onc  History: Oncology History  Malignant neoplasm of upper-outer quadrant of right breast in female, estrogen receptor positive (HCC)  06/23/2021 Initial Diagnosis   Palpable lump in the right breast. Diagnostic mammogram and [place]: a 3.8 cm mass at the retroareolar right breast, 1 prominent right axillary lymph node with a 4 mm cortex, 1 other which is borderline measuring 3 mm. 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  3 o'clock position: Biopsy fibroadenoma   07/12/2021 -  Neo-Adjuvant Chemotherapy   Neoadjuvant chemotherapy with TCH Perjeta 6 cycles followed by Herceptin Perjeta maintenance versus Kadcyla maintenance (based on response to neoadjuvant chemo) for 1 year   07/28/2021 Genetic Testing   Negative hereditary cancer genetic testing: no pathogenic variants detected in [facility] CancerNext-Expanded +RNAinsight Panel.  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  The report date is July 28, 2021.   The CancerNext-Expanded gene panel offered by [facility] and includes sequencing, rearrangement, and RNA analysis for the following 77 genes: AIP, ALK, APC, ATM, AXIN2, BAP1, BARD1, BLM, BMPR1A, BRCA1, BRCA2, BRIP1, CDC73, CDH1, CDK4, CDKN1B, CDKN2A, CHEK2, CTNNA1, DICER1, FANCC, FH, FLCN, GALNT12, KIF1B, LZTR1, MAX, MEN1, MET, MLH1, MSH2, MSH3, MSH6, MUTYH, NBN, NF1, NF2, NTHL1, PALB2, PHOX2B, PMS2, POT1, PRKAR1A, PTCH1, PTEN, RAD51C, RAD51D, RB1, RECQL, RET, SDHA, SDHAF2, SDHB, SDHC, SDHD, SMAD4, SMARCA4, SMARCB1, SMARCE1, STK11, SUFU, TMEM127, TP53, TSC1, TSC2, VHL and XRCC2 (sequencing and deletion/duplication); EGFR, EGLN1, HOXB13, KIT, MITF, PDGFRA, POLD1, and POLE (sequencing only); EPCAM and GREM1 (deletion/duplication only).    12/06/2021 Surgery   Bilateral Mastectomies: Left mastectomy: Benign Right mastectomy: Multifocal ILC 1.8 cm, 0.9 cm,  margins negative, 1/2 lymph nodes positive,  ER 50%, PR 30%, HER2 1+ IHC   12/26/2021 - 12/29/2021 Chemotherapy   Patient is on Treatment Plan : BREAST ADO-Trastuzumab Emtansine (Kadcyla) q21d         Interval history-: Jenna Cross is a 37 y.o. female with oncologic history as above presenting to [facility] today with chief complaint of cough, headache, and body aches.  She presents unaccompanied to clinic.  Patient states her symptoms have been intermittent x 3 weeks.  She has been closely monitoring her temperature and has been afebrile.  She does note that her 2 younger children were sick recently with hand-foot-and-mouth and URI symptoms.  Patient reports her cough is both dry and productive.  Recently has been productive with yellow phlegm.  Last night the cough kept her awake.  She has been taking Tylenol sparingly which helps with the body aches.  She also reports low energy levels.  She denies any shortness of breath or wheezing.  Denies any neck pain.  Patient did have right breast reconstruction with which dismiss dorsi flap and right breast tissue expander placement on 01/31/2023.  She does admit immediately after surgery she was having to wear a binder and compressive bra which caused her to have shallow breathing.  Her JP drains have been removed and she is no longer wearing compression bra.       ROS  All other systems are reviewed and are negative for acute change except as noted in the HPI.    Allergies  Allergen Reactions   Wound Dressings Other (See Comments)    Honey comb dressing causes redness and inflammation      Past Medical History:  Diagnosis Date   Anemia    pregnancy related   Anxiety    Anxiety    no medication   Cancer (HCC)    breast   COVID 09/2020   mild case   Family history of prostate cancer 07/17/2021   History of DVT (deep vein thrombosis)    in her neck   History of radiation therapy    Right breast 02/22/22-04/13/22- Dr. Antony Blackbird     Past Surgical History:  Procedure  Laterality Date   BREAST IMPLANT EXCHANGE Right 01/25/2022   Procedure: BREAST IMPLANT REMOVAL;  Surgeon: Peggye Form, DO;  Location: [facility] OR;  Service: Plastics;  Laterality: Right;  1 hour   BREAST RECONSTRUCTION WITH PLACEMENT OF TISSUE EXPANDER AND FLEX HD (ACELLULAR HYDRATED DERMIS) Bilateral 12/06/2021   Procedure: BREAST RECONSTRUCTION WITH PLACEMENT OF TISSUE EXPANDER AND FLEX HD (ACELLULAR HYDRATED DERMIS);  Surgeon: Peggye Form, DO;  Location: [facility];  Service: Plastics;  Laterality: Bilateral;   BREAST SURGERY     cyst removal from right breast    CESAREAN SECTION N/A 01/31/2021   Procedure: CESAREAN SECTION;  Surgeon: Marcelle Overlie, MD;  Location: [facility] LD ORS;  Service: Obstetrics;  Laterality: N/A;   CHG RADIOLOGICAL GUIDANCE PRQ DRG W/PLMT CATH RS&I  01/23/2022   excision of fibroadenoma of breast     PORT-A-CATH REMOVAL Left 12/03/2022   Procedure: REMOVAL PORT-A-CATH;  Surgeon: Emelia Loron, MD;  Location: [facility];  Service: General;  Laterality: Left;   PORTACATH PLACEMENT N/A 07/11/2021   Procedure: INSERTION PORT-A-CATH;  Surgeon: Emelia Loron, MD;  Location: [facility];  Service: General;  Laterality: N/A;   RADIOACTIVE SEED GUIDED AXILLARY SENTINEL LYMPH NODE Right 12/06/2021   Procedure: RADIOACTIVE SEED GUIDED RIGHT AXILLARY SENTINEL  LYMPH NODE EXCISION;  Surgeon: Emelia Loron, MD;  Location: [facility];  Service: General;  Laterality: Right;   REMOVAL OF BILATERAL TISSUE EXPANDERS WITH PLACEMENT OF BILATERAL BREAST IMPLANTS Bilateral 01/01/2022   Procedure: REMOVAL OF BILATERAL TISSUE EXPANDERS WITH PLACEMENT OF BILATERAL BREAST IMPLANTS;  Surgeon: Peggye Form, DO;  Location: [facility];  Service: Plastics;  Laterality: Bilateral;   SENTINEL NODE BIOPSY Right 12/06/2021   Procedure: RIGHT AXILLARY SENTINEL NODE BIOPSY;  Surgeon: Emelia Loron, MD;  Location: [facility];  Service: General;  Laterality: Right;   TONSILLECTOMY     and adenoids   TONSILLECTOMY AND ADENOIDECTOMY     TOTAL MASTECTOMY Bilateral 12/06/2021   Procedure: BILATERAL SKIN SPARING TOTAL MASTECTOMY;  Surgeon: Emelia Loron, MD;  Location: [facility];  Service: General;  Laterality: Bilateral;   WISDOM TOOTH EXTRACTION      Social History   Socioeconomic History   Marital status: Married    Spouse name: Not on file   Number of children: 0   Years of education: College   Highest education level: Not on file  Occupational History   Not on file  Tobacco Use   Smoking status: Never   Smokeless tobacco: Never  Vaping Use   Vaping Use: Never used  Substance and Sexual Activity   Alcohol use: Not Currently   Drug use: Never   Sexual activity: Yes    Birth control/protection: None  Other Topics Concern   Not on file  Social History Narrative   ** Merged History Encounter **       Lives at home Right handed Drinks 1 cup of coffee daily and a couple of sodas per month   Social Determinants of Health   Financial Resource Strain: Not on file  Food Insecurity: Not on file  Transportation Needs: Not on file  Physical Activity: Not on file  Stress: Not on file  Social Connections: Not on file  Intimate Partner Violence: Not on file    Family History  Problem Relation Age of Onset   Migraines Mother    Prostate cancer Maternal Grandfather        dx after 68   Lymphoma Paternal Aunt        dx late 3s   Colon cancer Neg Hx    Esophageal cancer Neg Hx    Pancreatic cancer Neg Hx    Stomach cancer Neg Hx      Current Outpatient Medications:    amoxicillin-clavulanate (AUGMENTIN) 875-125 MG tablet, Take 1 tablet by mouth 2 (two) times daily for 7 days., Disp: 14 tablet, Rfl: 0   azithromycin (ZITHROMAX) 250 MG tablet, Take 2 tablets (500 mg total) by mouth daily for 1 day, THEN 1 tablet (250 mg total) daily for 4 days., Disp: 6 tablet,  Rfl: 0   benzonatate (TESSALON) 100 MG capsule, Take 1 capsule (100 mg total) by mouth 3 (three) times daily as needed for cough., Disp: 20 capsule, Rfl: 0   acetaminophen (TYLENOL) 500 MG tablet, Take 1,000 mg by mouth every 6 (six) hours as needed for mild pain or headache., Disp: , Rfl:    ALPRAZolam (XANAX) 0.5 MG tablet, Take 0.5 mg by mouth 3 (three) times daily as needed for anxiety., Disp: , Rfl:    clindamycin (CLINDAGEL) 1 % gel, Apply topically 2 (two) times daily. Apply to affect area two times daily, Disp: 30 g, Rfl: 1   clotrimazole-betamethasone (LOTRISONE) cream, Apply 1 Application topically  2 (two) times daily., Disp: 30 g, Rfl: 0   diphenhydrAMINE (BENADRYL) 25 mg capsule, Take 25 mg by mouth every 6 (six) hours as needed for itching or allergies., Disp: , Rfl:    diphenoxylate-atropine (LOMOTIL) 2.5-0.025 MG tablet, Take 1 tablet by mouth 3 (three) times daily as needed for diarrhea or loose stools., Disp: 30 tablet, Rfl: 3   Goserelin Acetate (ZOLADEX [place]), , Disp: , Rfl:    hydrocortisone cream 0.5 %, Apply 1 Application topically as needed for itching., Disp: , Rfl:    letrozole (FEMARA) 2.5 MG tablet, Take 1 tablet (2.5 mg total) by mouth daily., Disp: 90 tablet, Rfl: 3   lidocaine-prilocaine (EMLA) cream, lidocaine-prilocaine 2.5 %-2.5 % topical cream  APPLY TO AFFECTED AREA ONCE AS DIRECTED, Disp: , Rfl:    loratadine (CLARITIN) 10 MG tablet, Take 10 mg by mouth daily as needed for allergies., Disp: , Rfl:    LORazepam (ATIVAN) 0.5 MG tablet, TAKE 1 TABLET (0.5 MG TOTAL) BY MOUTH AT BEDTIME AS NEEDED (NAUSEA OR VOMITING)., Disp: , Rfl:    nystatin-triamcinolone (MYCOLOG II) cream, Apply 1 Application topically 4 (four) times daily., Disp: , Rfl:    ondansetron (ZOFRAN) 8 MG tablet, TAKE 1 TABLET BY MOUTH EVERY 8 HOURS AS NEEDED FOR NAUSEA, Disp: 30 tablet, Rfl: 3   polyethylene glycol (MIRALAX / GLYCOLAX) 17 g packet, Take 17 g by mouth daily as needed for mild constipation.,  Disp: , Rfl:    valACYclovir (VALTREX) 500 MG tablet, Take 500 mg by mouth every evening., Disp: , Rfl:    venlafaxine XR (EFFEXOR-XR) 37.5 MG 24 hr capsule, TAKE 1 CAPSULE BY MOUTH DAILY WITH BREAKFAST., Disp: 90 capsule, Rfl: 1  PHYSICAL EXAM: ECOG FS:1 - Symptomatic but completely ambulatory    Vitals:   03/25/23 1407 03/25/23 1457  BP: 109/82   Pulse: (!) 106 94  Resp: 16   Temp: 98.2 F (36.8 C)   TempSrc: Oral   SpO2: 95% 99%  Weight: 137 lb 9.6 oz (62.4 kg)    Physical Exam Vitals and nursing note reviewed.  Constitutional:      Appearance: She is not ill-appearing or toxic-appearing.  HENT:     Head: Normocephalic.  Eyes:     Conjunctiva/sclera: Conjunctivae normal.  Cardiovascular:     Rate and Rhythm: Normal rate and regular rhythm.     Pulses: Normal pulses.     Heart sounds: Normal heart sounds.  Pulmonary:     Effort: Pulmonary effort is normal.     Comments: Decreased air movement at right lung base.  No hypoxia.  Talking in full sentences. Abdominal:     General: There is no distension.  Musculoskeletal:     Cervical back: Normal range of motion. No tenderness.  Skin:    General: Skin is warm and dry.  Neurological:     Mental Status: She is alert.        LABORATORY DATA: I have reviewed the data as listed    Latest Ref Rng & Units 03/25/2023    2:55 PM 01/09/2023    9:09 AM 11/23/2022    8:25 AM  CBC  WBC 4.0 - 10.5 K/uL 13.2  4.6  6.3   Hemoglobin 12.0 - 15.0 g/dL 16.1  09.6  04.5   Hematocrit 36.0 - 46.0 % 36.4  38.8  37.3   Platelets 150 - 400 K/uL 263  199  193         Latest Ref Rng & Units  03/25/2023    2:55 PM 01/09/2023    9:09 AM 11/23/2022    8:25 AM  CMP  Glucose 70 - 99 mg/dL 161  91  92   BUN 6 - 20 mg/dL 10  11  12    Creatinine 0.44 - 1.00 mg/dL 0.96  0.45  4.09   Sodium 135 - 145 mmol/L 137  139  140   Potassium 3.5 - 5.1 mmol/L 3.6  4.0  3.5   Chloride 98 - 111 mmol/L 104  108  106   CO2 22 - 32 mmol/L 25  25  27     Calcium 8.9 - 10.3 mg/dL 9.4  9.4  9.2   Total Protein 6.5 - 8.1 g/dL 6.8  7.0  7.0   Total Bilirubin 0.3 - 1.2 mg/dL 0.5  0.5  0.4   Alkaline Phos 38 - 126 U/L 122  123  152   AST 15 - 41 U/L 14  21  26    ALT 0 - 44 U/L 11  14  23         RADIOGRAPHIC STUDIES (from last 24 hours if applicable) I have personally reviewed the radiological images as listed and agreed with the findings in the report. DG Chest 2 View  Result Date: 03/25/2023 CLINICAL DATA:  Cough with flu like symptoms for 3 weeks. EXAM: CHEST - 2 VIEW COMPARISON:  Radiographs 01/22/2022.  CT 07/10/2021. FINDINGS: The heart size and mediastinal contours are stable. There is patchy airspace disease in the right lower lobe which could reflect early pneumonia. The lungs are otherwise clear. There is no pleural effusion or pneumothorax. Postsurgical changes are present in both breasts and axilla with a right-sided chest wall tissue expander. IMPRESSION: Patchy right lower lobe airspace disease suspicious for early pneumonia. Electronically Signed   By: Carey Bullocks M.D.   On: 03/25/2023 14:31        Visit Diagnosis: 1. Community acquired pneumonia of right lower lobe of lung   2. Malignant neoplasm of upper-outer quadrant of right breast in female, estrogen receptor positive (HCC)      Orders Placed This Encounter  Procedures   CBC with Differential (Cancer Center Only)    Standing Status:   Future    Number of Occurrences:   1    Standing Expiration Date:   03/24/2024   CMP (Cancer Center only)    Standing Status:   Future    Number of Occurrences:   1    Standing Expiration Date:   03/24/2024    All questions were answered. The patient knows to call the clinic with any problems, questions or concerns. No barriers to learning was detected.  A total of more than 30 minutes were spent on this encounter with face-to-face time and non-face-to-face time, including preparing to see the patient, ordering tests and/or  medications, counseling the patient and coordination of care as outlined above.    Thank you for allowing me to participate in the care of this patient.    Shanon Ace, PA-C Department of Hematology/Oncology [facility] at [facility] Phone: [phone]  [phone]    03/25/2023 3:57 PM

## 2023-03-27 ENCOUNTER — Inpatient Hospital Stay: Payer: 59

## 2023-04-03 ENCOUNTER — Inpatient Hospital Stay: Payer: 59

## 2023-04-03 ENCOUNTER — Other Ambulatory Visit: Payer: Self-pay

## 2023-04-03 VITALS — BP 101/67 | HR 92 | Temp 98.0°F | Resp 18

## 2023-04-03 DIAGNOSIS — Z5111 Encounter for antineoplastic chemotherapy: Secondary | ICD-10-CM | POA: Diagnosis not present

## 2023-04-03 DIAGNOSIS — Z95828 Presence of other vascular implants and grafts: Secondary | ICD-10-CM

## 2023-04-03 DIAGNOSIS — C50411 Malignant neoplasm of upper-outer quadrant of right female breast: Secondary | ICD-10-CM

## 2023-04-03 DIAGNOSIS — Z170 Estrogen receptor positive status [ER+]: Secondary | ICD-10-CM

## 2023-04-03 MED ORDER — GOSERELIN ACETATE 3.6 MG ~~LOC~~ IMPL
3.6000 mg | DRUG_IMPLANT | Freq: Once | SUBCUTANEOUS | Status: AC
  Administered 2023-04-03: 3.6 mg via SUBCUTANEOUS
  Filled 2023-04-03: qty 3.6

## 2023-04-24 ENCOUNTER — Inpatient Hospital Stay: Payer: 59

## 2023-04-30 ENCOUNTER — Encounter: Payer: Self-pay | Admitting: Hematology and Oncology

## 2023-05-01 ENCOUNTER — Inpatient Hospital Stay: Payer: 59 | Attending: Hematology and Oncology

## 2023-05-01 ENCOUNTER — Other Ambulatory Visit: Payer: Self-pay

## 2023-05-01 VITALS — BP 114/72 | HR 105 | Temp 98.4°F | Resp 18

## 2023-05-01 DIAGNOSIS — Z79899 Other long term (current) drug therapy: Secondary | ICD-10-CM | POA: Insufficient documentation

## 2023-05-01 DIAGNOSIS — C50411 Malignant neoplasm of upper-outer quadrant of right female breast: Secondary | ICD-10-CM | POA: Diagnosis present

## 2023-05-01 DIAGNOSIS — Z5111 Encounter for antineoplastic chemotherapy: Secondary | ICD-10-CM | POA: Diagnosis not present

## 2023-05-01 DIAGNOSIS — Z95828 Presence of other vascular implants and grafts: Secondary | ICD-10-CM

## 2023-05-01 MED ORDER — GOSERELIN ACETATE 3.6 MG ~~LOC~~ IMPL
3.6000 mg | DRUG_IMPLANT | Freq: Once | SUBCUTANEOUS | Status: AC
  Administered 2023-05-01: 3.6 mg via SUBCUTANEOUS
  Filled 2023-05-01: qty 3.6

## 2023-05-01 NOTE — Patient Instructions (Signed)
Goserelin Implant What is this medication? GOSERELIN (GOE se rel in) treats prostate cancer and breast cancer. It works by decreasing levels of the hormones testosterone and estrogen in the body. This prevents prostate and breast cancer cells from spreading or growing. It may also be used to treat endometriosis. This is a condition where the tissue that lines the uterus grows outside the uterus. It works by decreasing the amount of estrogen your body makes, which reduces heavy bleeding and pain. It can also be used to help thin the lining of the uterus before a surgery used to prevent or reduce heavy periods. This medicine may be used for other purposes; ask your health care provider or pharmacist if you have questions. COMMON BRAND NAME(S): Zoladex, Zoladex 3-Month What should I tell my care team before I take this medication? They need to know if you have any of these conditions: Bone problems Diabetes Heart disease History of irregular heartbeat or rhythm An unusual or allergic reaction to goserelin, other medications, foods, dyes, or preservatives Pregnant or trying to get pregnant Breastfeeding How should I use this medication? This medication is injected under the skin. It is given by your care team in a hospital or clinic setting. Talk to your care team about the use of this medication in children. Special care may be needed. Overdosage: If you think you have taken too much of this medicine contact a poison control center or emergency room at once. NOTE: This medicine is only for you. Do not share this medicine with others. What if I miss a dose? Keep appointments for follow-up doses. It is important not to miss your dose. Call your care team if you are unable to keep an appointment. What may interact with this medication? Do not take this medication with any of the following: Cisapride Dronedarone Pimozide Thioridazine This medication may also interact with the following: Other  medications that cause heart rhythm changes This list may not describe all possible interactions. Give your health care provider a list of all the medicines, herbs, non-prescription drugs, or dietary supplements you use. Also tell them if you smoke, drink alcohol, or use illegal drugs. Some items may interact with your medicine. What should I watch for while using this medication? Visit your care team for regular checks on your progress. Your symptoms may appear to get worse during the first weeks of this therapy. Tell your care team if your symptoms do not start to get better or if they get worse after this time. Using this medication for a long time may weaken your bones. If you smoke or frequently drink alcohol you may increase your risk of bone loss. A family history of osteoporosis, chronic use of medications for seizures (convulsions), or corticosteroids can also increase your risk of bone loss. The risk of bone fractures may be increased. Talk to your care team about your bone health. This medication may increase blood sugar. The risk may be higher in patients who already have diabetes. Ask your care team what you can do to lower your risk of diabetes while taking this medication. This medication should stop regular monthly menstruation in women. Tell your care team if you continue to menstruate. Talk to your care team if you wish to become pregnant or think you might be pregnant. This medication can cause serious birth defects if taken during pregnancy or for 12 weeks after stopping treatment. Talk to your care team about reliable forms of contraception. Do not breastfeed while taking this   medication. This medication may cause infertility. Talk to your care team if you are concerned about your fertility. What side effects may I notice from receiving this medication? Side effects that you should report to your care team as soon as possible: Allergic reactions--skin rash, itching, hives, swelling  of the face, lips, tongue, or throat Change in the amount of urine Heart attack--pain or tightness in the chest, shoulders, arms, or jaw, nausea, shortness of breath, cold or clammy skin, feeling faint or lightheaded Heart rhythm changes--fast or irregular heartbeat, dizziness, feeling faint or lightheaded, chest pain, trouble breathing High blood sugar (hyperglycemia)--increased thirst or amount of urine, unusual weakness or fatigue, blurry vision High calcium level--increased thirst or amount of urine, nausea, vomiting, confusion, unusual weakness or fatigue, bone pain Pain, redness, irritation, or bruising at the injection site Severe back pain, numbness or weakness of the hands, arms, legs, or feet, loss of coordination, loss of bowel or bladder control Stroke--sudden numbness or weakness of the face, arm, or leg, trouble speaking, confusion, trouble walking, loss of balance or coordination, dizziness, severe headache, change in vision Swelling and pain of the tumor site or lymph nodes Trouble passing urine Side effects that usually do not require medical attention (report to your care team if they continue or are bothersome): Change in sex drive or performance Headache Hot flashes Rapid or extreme change in emotion or mood Sweating Swelling of the ankles, hands, or feet Unusual vaginal discharge, itching, or odor This list may not describe all possible side effects. Call your doctor for medical advice about side effects. You may report side effects to FDA at 1-800-FDA-1088. Where should I keep my medication? This medication is given in a hospital or clinic. It will not be stored at home. NOTE: This sheet is a summary. It may not cover all possible information. If you have questions about this medicine, talk to your doctor, pharmacist, or health care provider.  2024 Elsevier/Gold Standard (2022-02-15 00:00:00)  

## 2023-05-02 ENCOUNTER — Inpatient Hospital Stay: Payer: 59

## 2023-05-07 ENCOUNTER — Encounter: Payer: Self-pay | Admitting: Hematology and Oncology

## 2023-05-21 ENCOUNTER — Encounter: Payer: Self-pay | Admitting: Hematology and Oncology

## 2023-05-24 ENCOUNTER — Inpatient Hospital Stay: Payer: 59 | Admitting: Hematology and Oncology

## 2023-05-24 ENCOUNTER — Inpatient Hospital Stay: Payer: 59

## 2023-05-24 ENCOUNTER — Encounter: Payer: Self-pay | Admitting: Hematology and Oncology

## 2023-05-24 NOTE — Assessment & Plan Note (Deleted)
Palpable lump in the right breast. Diagnostic mammogram and [place]: a 3.8 cm mass at the retroareolar right breast, 1 prominent right axillary lymph node with a 4 mm cortex, 1 other which is borderline measuring 3 mm. 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  3 o'clock position: Biopsy fibroadenoma   Treatment plan: 1. Neoadjuvant chemotherapy with TCH Perjeta 6 cycles followed by Herceptin Perjeta maintenance versus Kadcyla maintenance (based on response to neoadjuvant chemo) for 1 year 2. Bilateral Mastectomies: Left mastectomy: Benign Right mastectomy: Multifocal ILC 1.8 cm, 0.9 cm, margins negative, 1/2 lymph nodes positive, ER 50%, PR 30%, HER2 1+ IHC 3.  [facility] HER2 clinical trial: Randomization between Kadcyla versus Kadcyla plus Tucatinib 4. Followed by adjuvant radiation therapy  5.  Followed by antiestrogen therapy   ------------------------------------------------------------------------------------------------------------------------ Prior treatment: Kadcyla cycle 14 maintenance (last cycle) ([facility] HER2 clinical trial) + Tucatinib vs Placebo (last tucatinib 12/13/22) Current treatment: Zoladex with letrozole   Hospitalization 01/22/2022-01/26/2022 abscess/cellulitis required removal of the implant   She has seen Dr. Donata Duff plastic surgery who plans to do a latissimus dorsi flap reconstruction after Kadcyla is completed. She plans to do oophorectomy once she recovers from her breast reconstruction.   Signatera 03/01/2023: Negative

## 2023-05-29 ENCOUNTER — Encounter: Payer: Self-pay | Admitting: Hematology and Oncology

## 2023-05-30 ENCOUNTER — Inpatient Hospital Stay: Payer: 59

## 2023-06-03 ENCOUNTER — Inpatient Hospital Stay: Payer: 59

## 2023-06-04 ENCOUNTER — Inpatient Hospital Stay: Payer: 59 | Attending: Hematology and Oncology

## 2023-06-04 VITALS — BP 116/66 | HR 92 | Temp 98.5°F | Resp 18

## 2023-06-04 DIAGNOSIS — Z79899 Other long term (current) drug therapy: Secondary | ICD-10-CM | POA: Diagnosis not present

## 2023-06-04 DIAGNOSIS — C50411 Malignant neoplasm of upper-outer quadrant of right female breast: Secondary | ICD-10-CM | POA: Diagnosis present

## 2023-06-04 DIAGNOSIS — Z95828 Presence of other vascular implants and grafts: Secondary | ICD-10-CM

## 2023-06-04 DIAGNOSIS — Z5111 Encounter for antineoplastic chemotherapy: Secondary | ICD-10-CM | POA: Insufficient documentation

## 2023-06-04 MED ORDER — GOSERELIN ACETATE 3.6 MG ~~LOC~~ IMPL
3.6000 mg | DRUG_IMPLANT | Freq: Once | SUBCUTANEOUS | Status: AC
  Administered 2023-06-04: 3.6 mg via SUBCUTANEOUS
  Filled 2023-06-04: qty 3.6

## 2023-06-11 ENCOUNTER — Encounter: Payer: Self-pay | Admitting: Hematology and Oncology

## 2023-06-11 NOTE — Telephone Encounter (Signed)
Called pt per MD to advise Signatera testing was negative/not detected. Pt verbalized understanding of results and knows [facility] will be in touch to schedule 3 mo repeat lab.   

## 2023-06-14 ENCOUNTER — Encounter: Payer: Self-pay | Admitting: Hematology and Oncology

## 2023-06-14 ENCOUNTER — Other Ambulatory Visit: Payer: Self-pay | Admitting: Hematology and Oncology

## 2023-06-17 ENCOUNTER — Encounter: Payer: Self-pay | Admitting: *Deleted

## 2023-06-17 ENCOUNTER — Inpatient Hospital Stay: Payer: 59 | Attending: Hematology and Oncology | Admitting: Hematology and Oncology

## 2023-06-17 VITALS — BP 111/50 | HR 90 | Temp 97.7°F | Resp 17 | Ht 64.0 in | Wt 136.7 lb

## 2023-06-17 DIAGNOSIS — C50411 Malignant neoplasm of upper-outer quadrant of right female breast: Secondary | ICD-10-CM | POA: Diagnosis not present

## 2023-06-17 DIAGNOSIS — Z006 Encounter for examination for normal comparison and control in clinical research program: Secondary | ICD-10-CM | POA: Insufficient documentation

## 2023-06-17 DIAGNOSIS — Z5111 Encounter for antineoplastic chemotherapy: Secondary | ICD-10-CM | POA: Diagnosis present

## 2023-06-17 DIAGNOSIS — C7931 Secondary malignant neoplasm of brain: Secondary | ICD-10-CM | POA: Insufficient documentation

## 2023-06-17 DIAGNOSIS — Z170 Estrogen receptor positive status [ER+]: Secondary | ICD-10-CM

## 2023-06-17 NOTE — Progress Notes (Signed)
Patient Care Team: Marcelle Overlie, MD as PCP - General (Obstetrics and Gynecology) Marcelle Overlie, MD (Obstetrics and Gynecology) Pershing Proud, RN as Oncology Nurse Navigator Rogelia Boga, Eileen Stanford, RN as Oncology Nurse Navigator Serena Croissant, MD as Consulting Physician (Hematology and Oncology) Antony Blackbird, MD as Consulting Physician (Radiation Oncology) Emelia Loron, MD as Consulting Physician (General Surgery)  DIAGNOSIS:  Encounter Diagnosis  Name Primary?   Malignant neoplasm of upper-outer quadrant of right breast in female, estrogen receptor positive (HCC) Yes    SUMMARY OF ONCOLOGIC HISTORY: Oncology History  Malignant neoplasm of upper-outer quadrant of right breast in female, estrogen receptor positive (HCC)  06/23/2021 Initial Diagnosis   Palpable lump in the right breast. Diagnostic mammogram and [place]: a 3.8 cm mass at the retroareolar right breast, 1 prominent right axillary lymph node with a 4 mm cortex, 1 other which is borderline measuring 3 mm. 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  3 o'clock position: Biopsy fibroadenoma   07/12/2021 -  Neo-Adjuvant Chemotherapy   Neoadjuvant chemotherapy with TCH Perjeta 6 cycles followed by Herceptin Perjeta maintenance versus Kadcyla maintenance (based on response to neoadjuvant chemo) for 1 year   07/28/2021 Genetic Testing   Negative hereditary cancer genetic testing: no pathogenic variants detected in [facility] CancerNext-Expanded +RNAinsight Panel.  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  The report date is July 28, 2021.   The CancerNext-Expanded gene panel offered by [facility] and includes sequencing, rearrangement, and RNA analysis for the following 77 genes: AIP, ALK, APC, ATM, AXIN2, BAP1, BARD1, BLM, BMPR1A, BRCA1, BRCA2, BRIP1, CDC73, CDH1, CDK4, CDKN1B, CDKN2A, CHEK2, CTNNA1, DICER1, FANCC, FH,  FLCN, GALNT12, KIF1B, LZTR1, MAX, MEN1, MET, MLH1, MSH2, MSH3, MSH6, MUTYH, NBN, NF1, NF2, NTHL1, PALB2, PHOX2B, PMS2, POT1, PRKAR1A, PTCH1, PTEN, RAD51C, RAD51D, RB1, RECQL, RET, SDHA, SDHAF2, SDHB, SDHC, SDHD, SMAD4, SMARCA4, SMARCB1, SMARCE1, STK11, SUFU, TMEM127, TP53, TSC1, TSC2, VHL and XRCC2 (sequencing and deletion/duplication); EGFR, EGLN1, HOXB13, KIT, MITF, PDGFRA, POLD1, and POLE (sequencing only); EPCAM and GREM1 (deletion/duplication only).    12/06/2021 Surgery   Bilateral Mastectomies: Left mastectomy: Benign Right mastectomy: Multifocal ILC 1.8 cm, 0.9 cm, margins negative, 1/2 lymph nodes positive, ER 50%, PR 30%, HER2 1+ IHC   12/26/2021 - 12/29/2021 Chemotherapy   Patient is on Treatment Plan : BREAST ADO-Trastuzumab Emtansine (Kadcyla) q21d       CHIEF COMPLIANT: Zoladex with letrozole   INTERVAL HISTORY: Jenna Cross is a 37 y.o with the above- mentioned on  Zoladex with letrozole. She presents to the clinic for a follow-up. Patient reports that she has been doing fine. She states the last expander she was in severe pain. She says she had to take pain medication for relief. She is tolerating the Zoladex. She  concerned about the weight gain she has gained at least 10 pounds. Mild hot flashes and denies any bone pain.    ALLERGIES:  is allergic to wound dressings.  MEDICATIONS:  Current Outpatient Medications  Medication Sig Dispense Refill   acetaminophen (TYLENOL) 500 MG tablet Take 1,000 mg by mouth every 6 (six) hours as needed for mild pain or headache.     ALPRAZolam (XANAX) 0.5 MG tablet Take 0.5 mg by mouth 3 (three) times daily as needed for anxiety.     buPROPion (WELLBUTRIN XL) 150 MG 24 hr tablet Take 150 mg by mouth daily.     cyclobenzaprine (FLEXERIL) 5 MG tablet Take 10 mg by  mouth 3 (three) times daily.     Goserelin Acetate (ZOLADEX [place])      letrozole (FEMARA) 2.5 MG tablet TAKE 1 TABLET BY MOUTH EVERY DAY 90 tablet 3   loratadine (CLARITIN) 10 MG  tablet Take 10 mg by mouth daily as needed for allergies.     ondansetron (ZOFRAN) 8 MG tablet TAKE 1 TABLET BY MOUTH EVERY 8 HOURS AS NEEDED FOR NAUSEA 30 tablet 3   tretinoin (RETIN-A) 0.025 % cream Apply topically at bedtime.     valACYclovir (VALTREX) 500 MG tablet Take 500 mg by mouth every evening.     venlafaxine XR (EFFEXOR-XR) 37.5 MG 24 hr capsule TAKE 1 CAPSULE BY MOUTH DAILY WITH BREAKFAST. 90 capsule 1   No current facility-administered medications for this visit.    PHYSICAL EXAMINATION: ECOG PERFORMANCE STATUS: 1 - Symptomatic but completely ambulatory  Vitals:   06/17/23 1542  BP: (!) 111/50  Pulse: 90  Resp: 17  Temp: 97.7 F (36.5 C)  SpO2: 100%   Filed Weights   06/17/23 1542  Weight: 136 lb 11.2 oz (62 kg)      LABORATORY DATA:  I have reviewed the data as listed    Latest Ref Rng & Units 03/25/2023    2:55 PM 01/09/2023    9:09 AM 11/23/2022    8:25 AM  CMP  Glucose 70 - 99 mg/dL 295  91  92   BUN 6 - 20 mg/dL 10  11  12    Creatinine 0.44 - 1.00 mg/dL 2.84  1.32  4.40   Sodium 135 - 145 mmol/L 137  139  140   Potassium 3.5 - 5.1 mmol/L 3.6  4.0  3.5   Chloride 98 - 111 mmol/L 104  108  106   CO2 22 - 32 mmol/L 25  25  27    Calcium 8.9 - 10.3 mg/dL 9.4  9.4  9.2   Total Protein 6.5 - 8.1 g/dL 6.8  7.0  7.0   Total Bilirubin 0.3 - 1.2 mg/dL 0.5  0.5  0.4   Alkaline Phos 38 - 126 U/L 122  123  152   AST 15 - 41 U/L 14  21  26    ALT 0 - 44 U/L 11  14  23      Lab Results  Component Value Date   WBC 13.2 (H) 03/25/2023   HGB 12.8 03/25/2023   HCT 36.4 03/25/2023   MCV 90.3 03/25/2023   PLT 263 03/25/2023   NEUTROABS 10.3 (H) 03/25/2023    ASSESSMENT & PLAN:  Malignant neoplasm of upper-outer quadrant of right breast in female, estrogen receptor positive (HCC) Palpable lump in the right breast. Diagnostic mammogram and [place]: a 3.8 cm mass at the retroareolar right breast, 1 prominent right axillary lymph node with a 4 mm cortex, 1 other which is  borderline measuring 3 mm. 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  3 o'clock position: Biopsy fibroadenoma   Treatment plan: 1. Neoadjuvant chemotherapy with TCH Perjeta 6 cycles followed by Herceptin Perjeta maintenance versus Kadcyla maintenance (based on response to neoadjuvant chemo) for 1 year 2. Bilateral Mastectomies: Left mastectomy: Benign Right mastectomy: Multifocal ILC 1.8 cm, 0.9 cm, margins negative, 1/2 lymph nodes positive, ER 50%, PR 30%, HER2 1+ IHC 3.  [facility] HER2 clinical trial: Randomization between Kadcyla versus Kadcyla plus Tucatinib completed 11/23/2022, last Tucatinib 12/13/2022 4. Followed by antiestrogen therapy with Zoladex and letrozole   ------------------------------------------------------------------------------------------------------------------------ Current treatment: Zoladex  with letrozole Hospitalization 01/22/2022-01/26/2022 abscess/cellulitis required removal of the implant Latissimus dorsi reconstruction April 2024 Breast expanders August 2024 Plan is for implant placement November 2024   Antiestrogen therapy adverse effects: Tolerating it well   Continue monthly Zoladex injections for ovarian function suppression    She plans to do oophorectomy once she recovers from her breast reconstruction.  She wants to do a hysterectomy with BSO accompanied by tummy tuck operation.   Signatera testing: Negative   Return to clinic in 6 months for follow-up.  She will come monthly for Zoladex injections until she undergoes oophorectomy.    No orders of the defined types were placed in this encounter.  The patient has a good understanding of the overall plan. she agrees with it. she will call with any problems that may develop before the next visit here. Total time spent: 30 mins including face to face time and time spent for planning, charting and co-ordination of care   Tamsen Meek, MD 06/17/23    I Janan Ridge am acting as a Neurosurgeon for [employer]  I have reviewed the above documentation for accuracy and completeness, and I agree with the above.

## 2023-06-17 NOTE — Research (Unsigned)
CompassHER2 Residual Disease, A Double-Blinded, Phase III Randomized Trial of T-DM1 and Placebo Compared with T-DMI and Tucatinib  Patient presented to clinic unaccompanied for follow up visit #1 on study.     ePROs: Informed patient that PROs are available to complete on the Cloud app and due by 08/09/23.  Patient found available PROs to complete on her phone and stated she will complete them soon prior to the deadline.   LABS: Not required at this visit.   VITAL SIGNS: Collected per standard of care.   H&P: See Dr. Earmon Phoenix note from today.   Adverse Events: No serious AEs or grade 3+ AEs reported since end of treatment.   PLAN: Next follow up visit with Dr. Pamelia Hoit is due on 12/14/22 (+/-45 days). Patient has been provided direct contact information and is encouraged to contact this nurse for any questions or concerns prior to next visit. She verbalized understanding.  Thanked patient for her participation on this clinical trial.   Domenica Reamer, BSN, RN, CCRP Clinical Research Nurse II [phone] 06/17/2023

## 2023-06-17 NOTE — Assessment & Plan Note (Signed)
Palpable lump in the right breast. Diagnostic mammogram and [place]: a 3.8 cm mass at the retroareolar right breast, 1 prominent right axillary lymph node with a 4 mm cortex, 1 other which is borderline measuring 3 mm. 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  3 o'clock position: Biopsy fibroadenoma   Treatment plan: 1. Neoadjuvant chemotherapy with TCH Perjeta 6 cycles followed by Herceptin Perjeta maintenance versus Kadcyla maintenance (based on response to neoadjuvant chemo) for 1 year 2. Bilateral Mastectomies: Left mastectomy: Benign Right mastectomy: Multifocal ILC 1.8 cm, 0.9 cm, margins negative, 1/2 lymph nodes positive, ER 50%, PR 30%, HER2 1+ IHC 3.  [facility] HER2 clinical trial: Randomization between Kadcyla versus Kadcyla plus Tucatinib completed 11/23/2022, last Tucatinib 12/13/2022 4. Followed by antiestrogen therapy with Zoladex and letrozole   ------------------------------------------------------------------------------------------------------------------------ Current treatment: Zoladex with letrozole Hospitalization 01/22/2022-01/26/2022 abscess/cellulitis required removal of the implant The plan is now a delayed reconstruction   Antiestrogen therapy adverse effects: Tolerating it well   Continue monthly Zoladex injections for ovarian function suppression   She has seen Dr. Donata Duff plastic surgery who plans to do a latissimus dorsi flap reconstruction  She plans to do oophorectomy once she recovers from her breast reconstruction.   Signatera testing: Negative   Return to clinic in 1 year for follow-up.  She will come monthly for Zoladex injections until she undergoes oophorectomy.

## 2023-06-19 ENCOUNTER — Encounter: Payer: Self-pay | Admitting: Hematology and Oncology

## 2023-06-20 ENCOUNTER — Other Ambulatory Visit: Payer: Self-pay | Admitting: *Deleted

## 2023-06-20 ENCOUNTER — Telehealth: Payer: Self-pay | Admitting: Hematology and Oncology

## 2023-06-20 NOTE — Telephone Encounter (Signed)
Left patient a message regarding scheduled appointment times/dates; left call back number if need for reschedules

## 2023-06-24 ENCOUNTER — Encounter: Payer: Self-pay | Admitting: Hematology and Oncology

## 2023-06-25 ENCOUNTER — Inpatient Hospital Stay: Payer: 59

## 2023-06-25 ENCOUNTER — Telehealth: Payer: Self-pay | Admitting: Hematology and Oncology

## 2023-06-27 ENCOUNTER — Telehealth: Payer: Self-pay | Admitting: Hematology and Oncology

## 2023-07-02 ENCOUNTER — Inpatient Hospital Stay: Payer: 59

## 2023-07-02 ENCOUNTER — Encounter: Payer: Self-pay | Admitting: Hematology and Oncology

## 2023-07-04 ENCOUNTER — Encounter: Payer: Self-pay | Admitting: Hematology and Oncology

## 2023-07-04 ENCOUNTER — Inpatient Hospital Stay: Payer: 59

## 2023-07-04 DIAGNOSIS — C50411 Malignant neoplasm of upper-outer quadrant of right female breast: Secondary | ICD-10-CM

## 2023-07-04 DIAGNOSIS — Z95828 Presence of other vascular implants and grafts: Secondary | ICD-10-CM

## 2023-07-04 DIAGNOSIS — Z5111 Encounter for antineoplastic chemotherapy: Secondary | ICD-10-CM | POA: Diagnosis not present

## 2023-07-04 MED ORDER — GOSERELIN ACETATE 3.6 MG ~~LOC~~ IMPL
3.6000 mg | DRUG_IMPLANT | Freq: Once | SUBCUTANEOUS | Status: AC
  Administered 2023-07-04: 3.6 mg via SUBCUTANEOUS
  Filled 2023-07-04: qty 3.6

## 2023-07-09 ENCOUNTER — Telehealth: Payer: Self-pay | Admitting: *Deleted

## 2023-07-09 ENCOUNTER — Encounter: Payer: Self-pay | Admitting: Physician Assistant

## 2023-07-09 ENCOUNTER — Encounter: Payer: Self-pay | Admitting: Hematology and Oncology

## 2023-07-09 NOTE — Telephone Encounter (Signed)
[id number] Study- LVM for patient to return call to research nurse at her convenience. (Research nurse needs to assess ongoing AEs, neuropathy and insomnia, from end of treatment visit. This was not done at last follow up visit as it was not stated in protocol to complete at follow up visits. Neurosurgeon for study has queried the ongoing AEs for status and states any ongoing AEs need to continue to be assessed in Follow-up).    Domenica Reamer, BSN, RN, [employer] Research Nurse II [phone] 07/09/2023 4:36 PM

## 2023-07-10 NOTE — Telephone Encounter (Addendum)
Adverse Events:  Ongoing adverse events at end of treatment reviewed with patient by phone.      Moderate disturbed sleep/insomnia (grade 2) intermittent is ongoing and unchanged.  Patient states she feels this is now normal for her and not related to her cancer treatment but possibly due to her baseline anxiety.  Patient reports neuropathy symptoms in fingertips and hands slowly decreased and then resolved about 6 months after last Kadcyla treatment. She says it was resolved about a month before her last study visit around the beginning of September.     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  Peripheral neuropathy -- sensory  1 10/10/22 05/17/2023 Possible      Thanked patient for her time in reviewing these adverse events for the study and encouraged her to call if any questions before her next study follow up visit in March. She verbalized understanding.   Domenica Reamer, BSN, RN, [employer] Clinical Research Nurse II [phone] 07/10/2023 12:33 PM

## 2023-07-11 ENCOUNTER — Encounter: Payer: Self-pay | Admitting: Hematology and Oncology

## 2023-07-23 ENCOUNTER — Ambulatory Visit: Payer: 59

## 2023-07-29 ENCOUNTER — Encounter: Payer: Self-pay | Admitting: Hematology and Oncology

## 2023-07-30 ENCOUNTER — Inpatient Hospital Stay: Payer: 59 | Attending: Hematology and Oncology

## 2023-07-30 VITALS — BP 117/70 | HR 87 | Temp 98.2°F | Resp 18

## 2023-07-30 DIAGNOSIS — C50411 Malignant neoplasm of upper-outer quadrant of right female breast: Secondary | ICD-10-CM | POA: Insufficient documentation

## 2023-07-30 DIAGNOSIS — Z79899 Other long term (current) drug therapy: Secondary | ICD-10-CM | POA: Diagnosis not present

## 2023-07-30 DIAGNOSIS — Z5111 Encounter for antineoplastic chemotherapy: Secondary | ICD-10-CM | POA: Insufficient documentation

## 2023-07-30 DIAGNOSIS — Z95828 Presence of other vascular implants and grafts: Secondary | ICD-10-CM

## 2023-07-30 MED ORDER — GOSERELIN ACETATE 3.6 MG ~~LOC~~ IMPL
3.6000 mg | DRUG_IMPLANT | Freq: Once | SUBCUTANEOUS | Status: AC
  Administered 2023-07-30: 3.6 mg via SUBCUTANEOUS
  Filled 2023-07-30: qty 3.6

## 2023-08-04 ENCOUNTER — Other Ambulatory Visit: Payer: Self-pay | Admitting: Hematology and Oncology

## 2023-08-07 ENCOUNTER — Other Ambulatory Visit: Payer: Self-pay | Admitting: Hematology and Oncology

## 2023-08-18 ENCOUNTER — Encounter: Payer: Self-pay | Admitting: Hematology and Oncology

## 2023-08-19 ENCOUNTER — Other Ambulatory Visit: Payer: Self-pay | Admitting: *Deleted

## 2023-08-19 DIAGNOSIS — C50411 Malignant neoplasm of upper-outer quadrant of right female breast: Secondary | ICD-10-CM

## 2023-08-20 ENCOUNTER — Ambulatory Visit: Payer: 59

## 2023-08-27 ENCOUNTER — Inpatient Hospital Stay: Payer: 59

## 2023-08-27 ENCOUNTER — Encounter: Payer: Self-pay | Admitting: Hematology and Oncology

## 2023-08-29 ENCOUNTER — Inpatient Hospital Stay: Payer: 59 | Attending: Hematology and Oncology

## 2023-08-29 VITALS — BP 116/64 | HR 96 | Temp 97.9°F | Resp 16

## 2023-08-29 DIAGNOSIS — Z170 Estrogen receptor positive status [ER+]: Secondary | ICD-10-CM

## 2023-08-29 DIAGNOSIS — C50411 Malignant neoplasm of upper-outer quadrant of right female breast: Secondary | ICD-10-CM | POA: Diagnosis present

## 2023-08-29 DIAGNOSIS — Z95828 Presence of other vascular implants and grafts: Secondary | ICD-10-CM

## 2023-08-29 DIAGNOSIS — Z5111 Encounter for antineoplastic chemotherapy: Secondary | ICD-10-CM | POA: Diagnosis present

## 2023-08-29 DIAGNOSIS — Z79899 Other long term (current) drug therapy: Secondary | ICD-10-CM | POA: Insufficient documentation

## 2023-08-29 MED ORDER — GOSERELIN ACETATE 3.6 MG ~~LOC~~ IMPL
3.6000 mg | DRUG_IMPLANT | Freq: Once | SUBCUTANEOUS | Status: AC
  Administered 2023-08-29: 3.6 mg via SUBCUTANEOUS
  Filled 2023-08-29: qty 3.6

## 2023-09-09 LAB — SIGNATERA
SIGNATERA MTM READOUT: 0 MTM/ml
SIGNATERA TEST RESULT: NEGATIVE

## 2023-09-10 ENCOUNTER — Ambulatory Visit: Payer: 59 | Admitting: Rehabilitation

## 2023-09-11 ENCOUNTER — Telehealth: Payer: Self-pay

## 2023-09-11 NOTE — Telephone Encounter (Signed)
Called pt per MD to advise Signatera testing was negative/not detected. Pt verbalized understanding of results and knows [facility] will be in touch to schedule 3 mo repeat lab.   

## 2023-09-17 ENCOUNTER — Ambulatory Visit: Payer: 59

## 2023-09-24 ENCOUNTER — Inpatient Hospital Stay: Payer: 59 | Attending: Hematology and Oncology

## 2023-09-24 VITALS — BP 114/62 | HR 86 | Temp 97.9°F | Resp 17

## 2023-09-24 DIAGNOSIS — Z95828 Presence of other vascular implants and grafts: Secondary | ICD-10-CM

## 2023-09-24 DIAGNOSIS — C50411 Malignant neoplasm of upper-outer quadrant of right female breast: Secondary | ICD-10-CM | POA: Insufficient documentation

## 2023-09-24 DIAGNOSIS — Z79899 Other long term (current) drug therapy: Secondary | ICD-10-CM | POA: Insufficient documentation

## 2023-09-24 DIAGNOSIS — Z5111 Encounter for antineoplastic chemotherapy: Secondary | ICD-10-CM | POA: Diagnosis present

## 2023-09-24 DIAGNOSIS — Z170 Estrogen receptor positive status [ER+]: Secondary | ICD-10-CM

## 2023-09-24 MED ORDER — GOSERELIN ACETATE 3.6 MG ~~LOC~~ IMPL
3.6000 mg | DRUG_IMPLANT | Freq: Once | SUBCUTANEOUS | Status: AC
  Administered 2023-09-24: 3.6 mg via SUBCUTANEOUS
  Filled 2023-09-24: qty 3.6

## 2023-10-14 NOTE — Therapy (Addendum)
 OUTPATIENT PHYSICAL THERAPY  UPPER EXTREMITY ONCOLOGY EVALUATION  Patient Name: Jenna Cross MRN: [id number] DOB:Oct 06, 1986, 38 y.o., female Today's Date: 10/15/2023  END OF SESSION:  PT End of Session - 10/15/23 1009     Visit Number 1    Number of Visits 8    Date for PT Re-Evaluation 11/12/23    PT Start Time 1010   late   PT Stop Time 1055    PT Time Calculation (min) 45 min    Activity Tolerance Patient tolerated treatment well    Behavior During Therapy [facility] for tasks assessed/performed             Past Medical History:  Diagnosis Date   Anemia    pregnancy related   Anxiety    Anxiety    no medication   Cancer (HCC)    breast   COVID 09/2020   mild case   Family history of prostate cancer 07/17/2021   History of DVT (deep vein thrombosis)    in her neck   History of radiation therapy    Right breast 02/22/22-04/13/22- Dr. Lynwood Nasuti   Past Surgical History:  Procedure Laterality Date   BREAST IMPLANT EXCHANGE Right 01/25/2022   Procedure: BREAST IMPLANT REMOVAL;  Surgeon: Lowery Estefana RAMAN, DO;  Location: [facility] OR;  Service: Plastics;  Laterality: Right;  1 hour   BREAST RECONSTRUCTION WITH PLACEMENT OF TISSUE EXPANDER AND FLEX HD (ACELLULAR HYDRATED DERMIS) Bilateral 12/06/2021   Procedure: BREAST RECONSTRUCTION WITH PLACEMENT OF TISSUE EXPANDER AND FLEX HD (ACELLULAR HYDRATED DERMIS);  Surgeon: Lowery Estefana RAMAN, DO;  Location: [facility];  Service: Plastics;  Laterality: Bilateral;   BREAST SURGERY     cyst removal from right breast    CESAREAN SECTION N/A 01/31/2021   Procedure: CESAREAN SECTION;  Surgeon: Mat Browning, MD;  Location: [facility] LD ORS;  Service: Obstetrics;  Laterality: N/A;   CHG RADIOLOGICAL GUIDANCE PRQ DRG W/PLMT CATH RS&I  01/23/2022   excision of fibroadenoma of breast     PORT-A-CATH REMOVAL Left 12/03/2022   Procedure: REMOVAL PORT-A-CATH;  Surgeon: Ebbie Cough, MD;  Location: [facility];   Service: General;  Laterality: Left;   PORTACATH PLACEMENT N/A 07/11/2021   Procedure: INSERTION PORT-A-CATH;  Surgeon: Ebbie Cough, MD;  Location: [facility];  Service: General;  Laterality: N/A;   RADIOACTIVE SEED GUIDED AXILLARY SENTINEL LYMPH NODE Right 12/06/2021   Procedure: RADIOACTIVE SEED GUIDED RIGHT AXILLARY SENTINEL LYMPH NODE EXCISION;  Surgeon: Ebbie Cough, MD;  Location: [facility];  Service: General;  Laterality: Right;   REMOVAL OF BILATERAL TISSUE EXPANDERS WITH PLACEMENT OF BILATERAL BREAST IMPLANTS Bilateral 01/01/2022   Procedure: REMOVAL OF BILATERAL TISSUE EXPANDERS WITH PLACEMENT OF BILATERAL BREAST IMPLANTS;  Surgeon: Lowery Estefana RAMAN, DO;  Location: [facility];  Service: Plastics;  Laterality: Bilateral;   SENTINEL NODE BIOPSY Right 12/06/2021   Procedure: RIGHT AXILLARY SENTINEL NODE BIOPSY;  Surgeon: Ebbie Cough, MD;  Location: [facility];  Service: General;  Laterality: Right;   TONSILLECTOMY     and adenoids   TONSILLECTOMY AND ADENOIDECTOMY     TOTAL MASTECTOMY Bilateral 12/06/2021   Procedure: BILATERAL SKIN SPARING TOTAL MASTECTOMY;  Surgeon: Ebbie Cough, MD;  Location: [facility];  Service: General;  Laterality: Bilateral;   WISDOM TOOTH EXTRACTION     Patient Active Problem List   Diagnosis Date Noted   Breast asymmetry following reconstructive surgery 06/19/2022   Acquired absence of breast and  absent nipple, bilateral 06/19/2022   Port-A-Cath in place 01/26/2022   Infection of right breast 01/22/2022   History of DVT (deep vein thrombosis) 01/22/2022   Deep venous thrombosis (HCC) 09/26/2021   Genetic testing 08/01/2021   Family history of prostate cancer 07/17/2021   Malignant neoplasm of upper-outer quadrant of right breast in female, estrogen receptor positive (HCC) 06/27/2021   Breast tenderness in female 05/26/2021   Breast mass 05/26/2021   History of preterm  premature rupture of membranes (PPROM) 01/31/2021   Preterm premature rupture of membranes (PPROM) with unknown onset of labor 01/27/2021   Vacuum extractor delivery, delivered 01/04/2020   Pregnancy 01/03/2020   Encounter for counseling 08/29/2018   Paresthesia 04/05/2016    PCP: Rosaline Cobble, MD  REFERRING PROVIDER: Loretha Ash MD  REFERRING DIAG: s/p Right breast Lat flap with implant  THERAPY DIAG:  Malignant neoplasm of upper-outer quadrant of right breast in female, estrogen receptor positive (HCC)  Abnormal posture  ONSET DATE: 12/06/2021  Rationale for Evaluation and Treatment: Rehabilitation  SUBJECTIVE:                                                                                                                                                                                           SUBJECTIVE STATEMENT: I have had the same limitations the entire time. My right chest feels tight. The week before my prior surgeries I got a tightness in my right arm like cording, but I couldn't see anything. I don't have any problems with that now since the surgery. With reaching above my head I can do most things with more limited motion on the right.  I was given the OK to start back to pilates, and wt training and have no restrictions now. 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  She is s/p neoadjuvant chemotherapay x 6 cycles which started on 07/12/2021. She is s/p  bilateral mastectomy with  Right SLNB  and 1/2 LN's with immediate Expander reconstruction on 12/06/2021, radiation ended on 04/13/2022.  She had the expander exchange done on 01/01/2022. She experienced cellulitis/abscess in the right breast and on 01/25/2022 she had the right tissue expander removed. 01/31/2023 pt had Right  Breast reconstruction with Lat Flap and tisssue expander placement, with implant exchange on 08/26/2023.  PAIN:  Are you  having pain? No more tightness  PRECAUTIONS: Right UE lymphedema risk, prior cellulitis April 2023, on Xarelto  due to DVT from port. Will be removed in February.(NO CUPPING)   RED FLAGS: None   WEIGHT BEARING  RESTRICTIONS: No  FALLS:  Has patient fallen in last 6 months? No  LIVING ENVIRONMENT: Lives with: lives with their family, 2 young children , nearly 4 and 3 Lives in: House/apartment Stairs: Yes; Internal: 15 steps; can reach both Has following equipment at home: None  OCCUPATION: Copy Writer   LEISURE: walking, playing with kids , pilates, wt training  HAND DOMINANCE: right   PRIOR LEVEL OF FUNCTION: Independent  PATIENT GOALS: I would like my right arm ROM to match the left   OBJECTIVE: Note: Objective measures were completed at Evaluation unless otherwise noted.  COGNITION: Overall cognitive status: Within functional limits for tasks assessed   PALPATION:   OBSERVATIONS / OTHER ASSESSMENTS:  Incisions from lat flap and reconstruction and tissue implant all nicely healed, and left lower breast from implant adjustment and breast pocket adjustment. Pending a fat grafting surgery likely this summer on the right.  SENSATION: Light touch: Deficits numb through right breast and back   POSTURE:  forward head, rounded shoulders   UPPER EXTREMITY AROM/PROM:  A/PROM RIGHT   eval   Shoulder extension 40  Shoulder flexion 133  Shoulder abduction 85  Shoulder internal rotation 50  Shoulder external rotation 104    (Blank rows = not tested)  A/PROM LEFT   eval  Shoulder extension 60  Shoulder flexion 157  Shoulder abduction 180  Shoulder internal rotation 74  Shoulder external rotation 104    (Blank rows = not tested)  CERVICAL AROM: All within normal limits:      UPPER EXTREMITY STRENGTH:   LYMPHEDEMA ASSESSMENTS:   SURGERY TYPE/DATE:  She is s/p bilateral mastectomy with Right SLNB and 1/2 LN's with immediate Expander reconstruction on 12/06/2021,  01/01/2022 Expander exchange, 01/25/2022 Implant removed due to cellulitis/abcess , 01/31/2023 Breast reconstruction with Lat Flap and tisssue expander placement, with implant exchange on 08/26/2023  NUMBER OF LYMPH NODES REMOVED: 1+/2  CHEMOTHERAPY: Neoadjuvant, then herceptin  maintenance  RADIATION:Yes, ended 04/13/2022  HORMONE TREATMENT: Letrozole   INFECTIONS: yes, right breast, hospitalized 4/17-4/20/2023    LYMPHEDEMA ASSESSMENTS:   LANDMARK RIGHT  eval  At axilla    15 cm proximal to olecranon process   10 cm proximal to olecranon process   Olecranon process   15 cm proximal to ulnar styloid process   10 cm proximal to ulnar styloid process   Just proximal to ulnar styloid process   Across hand at thumb web space   At base of 2nd digit   (Blank rows = not tested)  LANDMARK LEFT  eval  At axilla    15 cm proximal to olecranon process   10 cm proximal to olecranon process   Olecranon process   15 cm proximal to ulnar styloid process   10 cm proximal to ulnar styloid process   Just proximal to ulnar styloid process   Across hand at thumb web space   At base of 2nd digit   (Blank rows = not tested)   FUNCTIONAL TESTS:    GAIT: WNL  L-DEX LYMPHEDEMA SCREENING: The patient was assessed using the L-Dex machine today to produce a lymphedema index baseline score. The patient will be reassessed on a regular basis (typically every 3 months) to obtain new L-Dex scores. If the score is > 6.5 points away from his/her baseline score indicating onset of subclinical lymphedema, it will be recommended to wear a compression garment for 4 weeks, 12 hours per day and then be reassessed. If the score continues to be > 6.5 points  from baseline at reassessment, we will initiate lymphedema treatment. Assessing in this manner has a 95% rate of preventing clinically significant lymphedema.  L-DEX FLOWSHEETS - 10/15/23 1000       L-DEX LYMPHEDEMA SCREENING   Measurement Type Unilateral     L-DEX MEASUREMENT EXTREMITY Upper Extremity    POSITION  Standing    DOMINANT SIDE Right    At Risk Side Right    BASELINE SCORE (UNILATERAL) 0.9    L-DEX SCORE (UNILATERAL) -2.6    VALUE CHANGE (UNILAT) -3.5             QUICK DASH SURVEY: 11%                                                                                                                            TREATMENT DATE:  10/15/2023 Supine wand flexion and scaption x 3 ea and stargazer x 3 ea to initiate gentle stretching Discussed POC including length of stay, treatment interventions   PATIENT EDUCATION:  Education details: supine wand flex and scaption x 3  Person educated: Patient Education method: Programmer, Multimedia, Demonstration, and Handouts Education comprehension: verbalized understanding and returned demonstration  HOME EXERCISE PROGRAM: Supine wand flexion and scaption, stargazer  ASSESSMENT:  CLINICAL IMPRESSION: Patient is a 38 y.o. female who was seen today for physical therapy evaluation and treatment for complaints of right chest tightness and right UE shoulder ROM deficits. . She is s/p bilateral mastectomy with Right SLNB and 1/2 LN's with immediate Expander reconstruction on 12/06/2021, 01/01/2022 Expander exchange, 01/25/2022 Implant removed due to cellulitis/abcess , 01/31/2023 Breast reconstruction with Lat Flap and tisssue expander placement, with implant exchange on 08/26/2023. She presents with limitations in Right shoulder ROM affecting right UE function. She will benefit from skilled PT to address deficits and return pt to PLOF.   OBJECTIVE IMPAIRMENTS: decreased activity tolerance, decreased knowledge of condition, decreased ROM, impaired flexibility, and impaired UE functional use.   ACTIVITY LIMITATIONS: reach over head  PARTICIPATION LIMITATIONS: able to do most things but with limitation in right UE ROM  PERSONAL FACTORS: 1-2 comorbidities: Right breast cancer s/p radiation and multiple surgeries  are also affecting patient's functional outcome.   REHAB POTENTIAL: Excellent  CLINICAL DECISION MAKING: Stable/uncomplicated  EVALUATION COMPLEXITY: Low  GOALS: Goals reviewed with patient? Yes  SHORT TERM GOALS: Target date: 11/12/2023  Pt will be independent and compliant with HE for right shoulder ROM and strengthening Baseline: Goal status: INITIAL  2.  Pt will have right shoulder flexion and abduction within 10 degrees of left to improve function Baseline:  Goal status: INITIAL  3.  Pt will be able to wash her back with improved ease Baseline:  Goal status: INITIAL  4.  Quick dash will improve to no greater than 2% Baseline:  Goal status: INITIAL   PLAN:  PT FREQUENCY: 2x/week  PT DURATION: 4 weeks  PLANNED INTERVENTIONS: 97164- PT Re-evaluation, 97110-Therapeutic exercises, 97112- Neuromuscular re-education, 97535- Self Care, [id number]- Manual therapy,  and Patient/Family education  PLAN FOR NEXT SESSION: wall slide for abd, pulleys, review HEP and update, PROM, STM prn, progress to strength PHYSICAL THERAPY DISCHARGE SUMMARY  Visits from Start of Care: 1  Current functional level related to goals / functional outcomes: Unknown. Pt did not return; she cancelled all appts due to now not being a good time for her   Remaining deficits: Unknown.   Education / Equipment: HEP   Patient agrees to discharge. Patient goals were not assessed due to not returning. Patient is being discharged due to the patient's request. Now is not a good time per pt.  Grayce JINNY Sheldon, PT 10/15/2023, 1:04 PM

## 2023-10-15 ENCOUNTER — Ambulatory Visit: Payer: 59 | Attending: Hematology and Oncology

## 2023-10-15 ENCOUNTER — Ambulatory Visit: Payer: 59

## 2023-10-15 ENCOUNTER — Other Ambulatory Visit: Payer: Self-pay

## 2023-10-15 DIAGNOSIS — R293 Abnormal posture: Secondary | ICD-10-CM | POA: Diagnosis present

## 2023-10-15 DIAGNOSIS — C50411 Malignant neoplasm of upper-outer quadrant of right female breast: Secondary | ICD-10-CM | POA: Insufficient documentation

## 2023-10-15 DIAGNOSIS — M25611 Stiffness of right shoulder, not elsewhere classified: Secondary | ICD-10-CM | POA: Insufficient documentation

## 2023-10-15 DIAGNOSIS — Z170 Estrogen receptor positive status [ER+]: Secondary | ICD-10-CM | POA: Insufficient documentation

## 2023-10-15 NOTE — Patient Instructions (Signed)
 SHOULDER: Flexion - Supine (Cane)        Cancer Rehab [phone]    Hold cane in both hands. Raise arms up overhead. Do not allow back to arch. Hold _5-10__ seconds. Do __5__ times; __2__ times a day.   Hands shoulder width apart Hands in Y position  Shoulder Blade Stretch    Clasp fingers behind head with elbows touching in front of face. Pull elbows back while pressing shoulder blades together. Relax and hold as tolerated, can place pillow under elbow here for comfort as needed and to allow for prolonged stretch.  Repeat __5__ times. Do __2__ sessions per day.     Copyright  VHI. All rights reserved.

## 2023-10-22 ENCOUNTER — Ambulatory Visit: Payer: 59

## 2023-10-24 ENCOUNTER — Inpatient Hospital Stay: Payer: 59 | Attending: Hematology and Oncology

## 2023-10-24 ENCOUNTER — Encounter: Payer: Self-pay | Admitting: Hematology and Oncology

## 2023-10-24 VITALS — BP 97/52 | HR 83 | Temp 98.3°F | Resp 16

## 2023-10-24 DIAGNOSIS — Z79899 Other long term (current) drug therapy: Secondary | ICD-10-CM | POA: Diagnosis not present

## 2023-10-24 DIAGNOSIS — Z170 Estrogen receptor positive status [ER+]: Secondary | ICD-10-CM

## 2023-10-24 DIAGNOSIS — Z5111 Encounter for antineoplastic chemotherapy: Secondary | ICD-10-CM | POA: Insufficient documentation

## 2023-10-24 DIAGNOSIS — Z95828 Presence of other vascular implants and grafts: Secondary | ICD-10-CM

## 2023-10-24 DIAGNOSIS — C50411 Malignant neoplasm of upper-outer quadrant of right female breast: Secondary | ICD-10-CM | POA: Diagnosis present

## 2023-10-24 MED ORDER — GOSERELIN ACETATE 3.6 MG ~~LOC~~ IMPL
3.6000 mg | DRUG_IMPLANT | Freq: Once | SUBCUTANEOUS | Status: AC
Start: 2023-10-24 — End: 2023-10-24
  Administered 2023-10-24: 3.6 mg via SUBCUTANEOUS
  Filled 2023-10-24: qty 3.6

## 2023-10-28 ENCOUNTER — Ambulatory Visit: Payer: 59

## 2023-10-30 ENCOUNTER — Ambulatory Visit: Payer: 59

## 2023-11-06 ENCOUNTER — Ambulatory Visit: Payer: 59

## 2023-11-11 ENCOUNTER — Ambulatory Visit: Payer: 59

## 2023-11-12 ENCOUNTER — Ambulatory Visit: Payer: 59

## 2023-11-12 ENCOUNTER — Other Ambulatory Visit: Payer: Self-pay | Admitting: *Deleted

## 2023-11-12 DIAGNOSIS — C50411 Malignant neoplasm of upper-outer quadrant of right female breast: Secondary | ICD-10-CM

## 2023-11-12 NOTE — Progress Notes (Signed)
Renewal orders placed for Signatera testing.

## 2023-11-19 ENCOUNTER — Encounter: Payer: Self-pay | Admitting: Hematology and Oncology

## 2023-11-19 ENCOUNTER — Ambulatory Visit: Payer: 59

## 2023-11-22 ENCOUNTER — Inpatient Hospital Stay: Payer: 59 | Attending: Hematology and Oncology

## 2023-11-22 VITALS — BP 97/63 | HR 93 | Temp 97.8°F | Resp 16

## 2023-11-22 DIAGNOSIS — Z5111 Encounter for antineoplastic chemotherapy: Secondary | ICD-10-CM | POA: Insufficient documentation

## 2023-11-22 DIAGNOSIS — Z95828 Presence of other vascular implants and grafts: Secondary | ICD-10-CM

## 2023-11-22 DIAGNOSIS — Z170 Estrogen receptor positive status [ER+]: Secondary | ICD-10-CM | POA: Diagnosis not present

## 2023-11-22 DIAGNOSIS — C50411 Malignant neoplasm of upper-outer quadrant of right female breast: Secondary | ICD-10-CM | POA: Diagnosis present

## 2023-11-22 MED ORDER — GOSERELIN ACETATE 3.6 MG ~~LOC~~ IMPL
3.6000 mg | DRUG_IMPLANT | Freq: Once | SUBCUTANEOUS | Status: AC
  Administered 2023-11-22: 3.6 mg via SUBCUTANEOUS
  Filled 2023-11-22: qty 3.6

## 2023-12-15 ENCOUNTER — Other Ambulatory Visit: Payer: Self-pay | Admitting: Hematology and Oncology

## 2023-12-16 ENCOUNTER — Encounter: Payer: Self-pay | Admitting: Hematology and Oncology

## 2023-12-17 ENCOUNTER — Other Ambulatory Visit: Payer: 59

## 2023-12-17 ENCOUNTER — Ambulatory Visit: Payer: 59 | Admitting: Hematology and Oncology

## 2023-12-17 ENCOUNTER — Ambulatory Visit: Payer: 59

## 2023-12-18 ENCOUNTER — Other Ambulatory Visit: Payer: Self-pay | Admitting: *Deleted

## 2023-12-18 DIAGNOSIS — Z170 Estrogen receptor positive status [ER+]: Secondary | ICD-10-CM

## 2023-12-23 ENCOUNTER — Encounter: Payer: Self-pay | Admitting: Hematology and Oncology

## 2023-12-24 ENCOUNTER — Inpatient Hospital Stay: Payer: 59 | Attending: Hematology and Oncology

## 2023-12-24 ENCOUNTER — Inpatient Hospital Stay: Payer: 59

## 2023-12-24 ENCOUNTER — Other Ambulatory Visit: Payer: Self-pay | Admitting: *Deleted

## 2023-12-24 ENCOUNTER — Encounter: Payer: Self-pay | Admitting: *Deleted

## 2023-12-24 ENCOUNTER — Inpatient Hospital Stay: Payer: 59 | Admitting: Hematology and Oncology

## 2023-12-24 VITALS — BP 103/60 | HR 87 | Temp 98.5°F | Resp 17 | Ht 64.0 in | Wt 135.1 lb

## 2023-12-24 DIAGNOSIS — Z95828 Presence of other vascular implants and grafts: Secondary | ICD-10-CM

## 2023-12-24 DIAGNOSIS — C50411 Malignant neoplasm of upper-outer quadrant of right female breast: Secondary | ICD-10-CM | POA: Insufficient documentation

## 2023-12-24 DIAGNOSIS — Z170 Estrogen receptor positive status [ER+]: Secondary | ICD-10-CM | POA: Diagnosis not present

## 2023-12-24 DIAGNOSIS — Z5111 Encounter for antineoplastic chemotherapy: Secondary | ICD-10-CM | POA: Insufficient documentation

## 2023-12-24 LAB — RESEARCH LABS

## 2023-12-24 MED ORDER — GOSERELIN ACETATE 3.6 MG ~~LOC~~ IMPL
3.6000 mg | DRUG_IMPLANT | Freq: Once | SUBCUTANEOUS | Status: AC
  Administered 2023-12-24: 3.6 mg via SUBCUTANEOUS
  Filled 2023-12-24: qty 3.6

## 2023-12-24 NOTE — Progress Notes (Signed)
 Patient Care Team: Marcelle Overlie, MD as PCP - General (Obstetrics and Gynecology) Marcelle Overlie, MD (Obstetrics and Gynecology) Pershing Proud, RN as Oncology Nurse Navigator Rogelia Boga, Eileen Stanford, RN as Oncology Nurse Navigator Serena Croissant, MD as Consulting Physician (Hematology and Oncology) Antony Blackbird, MD as Consulting Physician (Radiation Oncology) Emelia Loron, MD as Consulting Physician (General Surgery)  DIAGNOSIS:  Encounter Diagnosis  Name Primary?   Malignant neoplasm of upper-outer quadrant of right breast in female, estrogen receptor positive (HCC) Yes    SUMMARY OF ONCOLOGIC HISTORY: Oncology History  Malignant neoplasm of upper-outer quadrant of right breast in female, estrogen receptor positive (HCC)  06/23/2021 Initial Diagnosis   Palpable lump in the right breast. Diagnostic mammogram and [place]: a 3.8 cm mass at the retroareolar right breast, 1 prominent right axillary lymph node with a 4 mm cortex, 1 other which is borderline measuring 3 mm. 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  3 o'clock position: Biopsy fibroadenoma   07/12/2021 -  Neo-Adjuvant Chemotherapy   Neoadjuvant chemotherapy with TCH Perjeta 6 cycles followed by Herceptin Perjeta maintenance versus Kadcyla maintenance (based on response to neoadjuvant chemo) for 1 year   07/28/2021 Genetic Testing   Negative hereditary cancer genetic testing: no pathogenic variants detected in [facility] CancerNext-Expanded +RNAinsight Panel.  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  The report date is July 28, 2021.   The CancerNext-Expanded gene panel offered by [facility] and includes sequencing, rearrangement, and RNA analysis for the following 77 genes: AIP, ALK, APC, ATM, AXIN2, BAP1, BARD1, BLM, BMPR1A, BRCA1, BRCA2, BRIP1, CDC73, CDH1, CDK4, CDKN1B, CDKN2A, CHEK2, CTNNA1, DICER1, FANCC, FH,  FLCN, GALNT12, KIF1B, LZTR1, MAX, MEN1, MET, MLH1, MSH2, MSH3, MSH6, MUTYH, NBN, NF1, NF2, NTHL1, PALB2, PHOX2B, PMS2, POT1, PRKAR1A, PTCH1, PTEN, RAD51C, RAD51D, RB1, RECQL, RET, SDHA, SDHAF2, SDHB, SDHC, SDHD, SMAD4, SMARCA4, SMARCB1, SMARCE1, STK11, SUFU, TMEM127, TP53, TSC1, TSC2, VHL and XRCC2 (sequencing and deletion/duplication); EGFR, EGLN1, HOXB13, KIT, MITF, PDGFRA, POLD1, and POLE (sequencing only); EPCAM and GREM1 (deletion/duplication only).    12/06/2021 Surgery   Bilateral Mastectomies: Left mastectomy: Benign Right mastectomy: Multifocal ILC 1.8 cm, 0.9 cm, margins negative, 1/2 lymph nodes positive, ER 50%, PR 30%, HER2 1+ IHC   12/26/2021 - 12/29/2021 Chemotherapy   Patient is on Treatment Plan : BREAST ADO-Trastuzumab Emtansine (Kadcyla) q21d       CHIEF COMPLIANT: Surveillance breast cancer  HISTORY OF PRESENT ILLNESS:  History of Present Illness Jenna Cross, a patient with a history of breast cancer, presents for a routine follow-up. She reports that she is currently on anastrozole and is tolerating the medication well. She mentions that she is considering a hysterectomy and would like to discuss this further. She also expresses interest in participating in a memory study, noting some memory problems. Jenna Cross is also on Effexor for hot flashes, which she reports is effective. She denies significant joint stiffness. She mentions a recent stomach bug for which she used Zofran as needed. Jenna Cross is also planning for a fat grafting procedure in May, following a previous surgery in November. She also discusses her family history, mentioning a relative who was recently diagnosed with breast cancer and melanoma.     ALLERGIES:  is allergic to wound dressings.  MEDICATIONS:  Current Outpatient Medications  Medication Sig Dispense Refill   acetaminophen (TYLENOL) 500 MG tablet Take 1,000 mg by mouth every 6 (six) hours as needed for mild pain or headache.  ALPRAZolam (XANAX) 0.5  MG tablet Take 0.5 mg by mouth 3 (three) times daily as needed for anxiety.     Goserelin Acetate (ZOLADEX [place])      letrozole (FEMARA) 2.5 MG tablet TAKE 1 TABLET BY MOUTH EVERY DAY 90 tablet 3   loratadine (CLARITIN) 10 MG tablet Take 10 mg by mouth daily as needed for allergies.     ondansetron (ZOFRAN) 8 MG tablet TAKE 1 TABLET BY MOUTH EVERY 8 HOURS AS NEEDED FOR NAUSEA 18 tablet 6   tretinoin (RETIN-A) 0.025 % cream Apply topically at bedtime.     valACYclovir (VALTREX) 500 MG tablet Take 500 mg by mouth every evening.     venlafaxine XR (EFFEXOR-XR) 37.5 MG 24 hr capsule TAKE 1 CAPSULE BY MOUTH DAILY WITH BREAKFAST. 90 capsule 1   No current facility-administered medications for this visit.    PHYSICAL EXAMINATION: ECOG PERFORMANCE STATUS: 1 - Symptomatic but completely ambulatory  Vitals:   12/24/23 1345  BP: 103/60  Pulse: 87  Resp: 17  Temp: 98.5 F (36.9 C)  SpO2: 100%   Filed Weights   12/24/23 1345  Weight: 135 lb 1.6 oz (61.3 kg)      LABORATORY DATA:  I have reviewed the data as listed    Latest Ref Rng & Units 03/25/2023    2:55 PM 01/09/2023    9:09 AM 11/23/2022    8:25 AM  CMP  Glucose 70 - 99 mg/dL 098  91  92   BUN 6 - 20 mg/dL 10  11  12    Creatinine 0.44 - 1.00 mg/dL 1.19  1.47  8.29   Sodium 135 - 145 mmol/L 137  139  140   Potassium 3.5 - 5.1 mmol/L 3.6  4.0  3.5   Chloride 98 - 111 mmol/L 104  108  106   CO2 22 - 32 mmol/L 25  25  27    Calcium 8.9 - 10.3 mg/dL 9.4  9.4  9.2   Total Protein 6.5 - 8.1 g/dL 6.8  7.0  7.0   Total Bilirubin 0.3 - 1.2 mg/dL 0.5  0.5  0.4   Alkaline Phos 38 - 126 U/L 122  123  152   AST 15 - 41 U/L 14  21  26    ALT 0 - 44 U/L 11  14  23      Lab Results  Component Value Date   WBC 13.2 (H) 03/25/2023   HGB 12.8 03/25/2023   HCT 36.4 03/25/2023   MCV 90.3 03/25/2023   PLT 263 03/25/2023   NEUTROABS 10.3 (H) 03/25/2023    ASSESSMENT & PLAN:  Malignant neoplasm of upper-outer quadrant of right breast in  female, estrogen receptor positive (HCC) Palpable lump in the right breast. Diagnostic mammogram and [place]: a 3.8 cm mass at the retroareolar right breast, 1 prominent right axillary lymph node with a 4 mm cortex, 1 other which is borderline measuring 3 mm. 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  3 o'clock position: Biopsy fibroadenoma   Treatment plan: 1. Neoadjuvant chemotherapy with TCH Perjeta 6 cycles followed by Herceptin Perjeta maintenance versus Kadcyla maintenance (based on response to neoadjuvant chemo) for 1 year 2. Bilateral Mastectomies: Left mastectomy: Benign Right mastectomy: Multifocal ILC 1.8 cm, 0.9 cm, margins negative, 1/2 lymph nodes positive, ER 50%, PR 30%, HER2 1+ IHC 3.  [facility] HER2 clinical trial: Randomization between Kadcyla versus Kadcyla plus Tucatinib completed 11/23/2022, last Tucatinib 12/13/2022 4. Followed by  antiestrogen therapy with Zoladex and letrozole   ------------------------------------------------------------------------------------------------------------------------ Current treatment: Zoladex with letrozole Hospitalization 01/22/2022-01/26/2022 abscess/cellulitis required removal of the implant Latissimus dorsi reconstruction April 2024 Breast expanders August 2024 Implant placement November 2024   Antiestrogen therapy adverse effects: Tolerating it well   Continue monthly Zoladex injections for ovarian function suppression    She plans to do oophorectomy once she recovers from her breast reconstruction.  She wants to do a hysterectomy with BSO accompanied by tummy tuck operation.   Signatera testing: Negative Memory loss: We will evaluate her for eligibility for memory loss clinical trial Return to clinic in 6 months for follow-up.  She will come monthly for Zoladex injections until she undergoes oophorectomy. ------------------------------------- Assessment and  Plan Assessment & Plan Malignant neoplasm of upper-outer quadrant of right breast, estrogen receptor positive Undergoing treatment for estrogen receptor positive breast cancer. In remission with no visible cancer. Risk of recurrence estimated at 10% over ten years due to residual disease post-surgery. Natera test negative, highly sensitive for early recurrence detection. Discussed potential hysterectomy and continuation of hormone therapy for up to ten years. - Continue letrozole and goserelin acetate as per current regimen. - Schedule fat grafting revision surgery for end of May. - Consider hysterectomy based on her preference and timing. - Continue venlafaxine for management of hot flashes. - Continue Natera test every three months for monitoring. - No routine scans needed due to negative Natera test results. - Schedule bone density test two years after starting treatment.  Memory issues Reports memory problems and is interested in participating in a memory study involving computer-based memory improvement games. - Evaluate eligibility for memory study. - Conduct memory test to assess eligibility for study participation.  Medication management Currently taking several medications, including letrozole, goserelin acetate, venlafaxine, and others as needed. Discontinued Wellbutrin, Flexeril, and Singulair. - Update medication list to remove Wellbutrin, Flexeril, and Singulair. - Continue current medications as prescribed. - Ensure adequate refills for letrozole.      No orders of the defined types were placed in this encounter.  The patient has a good understanding of the overall plan. she agrees with it. she will call with any problems that may develop before the next visit here. Total time spent: 30 mins including face to face time and time spent for planning, charting and co-ordination of care   Tamsen Meek, MD 12/24/23

## 2023-12-24 NOTE — Research (Unsigned)
 CompassHER2 Residual Disease, A Double-Blinded, Phase III Randomized Trial of T-DM1 and Placebo Compared with T-DMI and Tucatinib  Patient presented to clinic unaccompanied for Clinical Follow Up- Visit #16    ePROs:  Patient completed available PROs on her phone today prior to visit.    LABS: Research labs drawn per protocol.   Concomitant Medications: Reviewed medication list with patient. She continues to take letrozole daily as prescribed and is receiving goserelin injections monthly ongoing. She is not taking any medications for hepatotoxicity or diarrhea. She is not taking any new treatment for cancer since last study visit.   H&P: See Dr. Earmon Phoenix note from today.   Adverse Events: Patient has ongoing insomnia occasionally for which she takes xanax prn at bedtime (grade 2).  Dr. Pamelia Hoit states this is unrelated to any study treatment. No serious or grade 3+ AEs reported since end of treatment.  She has planned breast revision plastic surgery scheduled for May and Dr. Pamelia Hoit states this is unrelated to any study treatment.   PLAN: Next follow up visit with Dr. Pamelia Hoit is due in 6 months (+/-45 days). Patient has been provided direct contact information and is encouraged to contact this nurse for any questions or concerns prior to next visit. She verbalized understanding.  Thanked patient for her participation on this clinical trial.   Domenica Reamer, BSN, RN, CCRP Clinical Research Nurse II [phone] 12/24/2023

## 2023-12-24 NOTE — Assessment & Plan Note (Signed)
 Palpable lump in the right breast. Diagnostic mammogram and [place]: a 3.8 cm mass at the retroareolar right breast, 1 prominent right axillary lymph node with a 4 mm cortex, 1 other which is borderline measuring 3 mm. 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  3 o'clock position: Biopsy fibroadenoma   Treatment plan: 1. Neoadjuvant chemotherapy with TCH Perjeta 6 cycles followed by Herceptin Perjeta maintenance versus Kadcyla maintenance (based on response to neoadjuvant chemo) for 1 year 2. Bilateral Mastectomies: Left mastectomy: Benign Right mastectomy: Multifocal ILC 1.8 cm, 0.9 cm, margins negative, 1/2 lymph nodes positive, ER 50%, PR 30%, HER2 1+ IHC 3.  Compass HER2 clinical trial: Randomization between Kadcyla versus Kadcyla plus Tucatinib completed 11/23/2022, last Tucatinib 12/13/2022 4. Followed by antiestrogen therapy with Zoladex and letrozole   ------------------------------------------------------------------------------------------------------------------------ Current treatment: Zoladex with letrozole Hospitalization 01/22/2022-01/26/2022 abscess/cellulitis required removal of the implant Latissimus dorsi reconstruction April 2024 Breast expanders August 2024 Plan is for implant placement November 2024   Antiestrogen therapy adverse effects: Tolerating it well   Continue monthly Zoladex injections for ovarian function suppression    She plans to do oophorectomy once she recovers from her breast reconstruction.  She wants to do a hysterectomy with BSO accompanied by tummy tuck operation.   Signatera testing: Negative   Return to clinic in 6 months for follow-up.  She will come monthly for Zoladex injections until she undergoes oophorectomy.

## 2023-12-24 NOTE — Progress Notes (Signed)
 Verbal orders received and placed per MD request for pt to have Signatera labs drawn every 3 months and not every 6 months.

## 2023-12-25 ENCOUNTER — Encounter: Payer: Self-pay | Admitting: Hematology and Oncology

## 2023-12-27 ENCOUNTER — Telehealth: Payer: Self-pay | Admitting: *Deleted

## 2023-12-27 NOTE — Telephone Encounter (Signed)
 URCC 18007 and NRG CC011 Studies: Dr. Pamelia Hoit referred patient for the above studies while being seen in clinic on 12/24/23. Patent left the clinic before research nurse could introduce the studies to patient.  Called patient after visit on 12/24/23 and left VM asking her to return call to research nurse to discuss the studies. Called patient again today and left VM briefly explaining the purpose of both studies and asked patient to return call to discuss to see if she is interested.  Domenica Reamer, BSN, RN, [employer] Research Nurse II [phone] 12/27/2023 4:11 PM

## 2024-01-05 LAB — SIGNATERA ONLY (NATERA MANAGED)
SIGNATERA MTM READOUT: 0 MTM/ml
SIGNATERA TEST RESULT: NEGATIVE

## 2024-01-10 ENCOUNTER — Telehealth: Payer: Self-pay | Admitting: *Deleted

## 2024-01-10 NOTE — Telephone Encounter (Signed)
 URCC 18007 and NRG CC011 Studies: Called patient to discuss the above studies.  Asked patient to return call if she can, otherwise research nurse will follow up with her at next appointment on 01/21/24.   Domenica Reamer, BSN, RN, [employer] Research Nurse II [phone] 01/10/2024 2:46 PM

## 2024-01-20 ENCOUNTER — Ambulatory Visit: Payer: 59

## 2024-01-21 ENCOUNTER — Inpatient Hospital Stay

## 2024-01-22 ENCOUNTER — Inpatient Hospital Stay: Attending: Hematology and Oncology

## 2024-01-22 ENCOUNTER — Encounter: Payer: Self-pay | Admitting: *Deleted

## 2024-01-22 VITALS — BP 105/65 | HR 83 | Temp 98.1°F | Resp 18

## 2024-01-22 DIAGNOSIS — Z170 Estrogen receptor positive status [ER+]: Secondary | ICD-10-CM | POA: Diagnosis present

## 2024-01-22 DIAGNOSIS — Z5111 Encounter for antineoplastic chemotherapy: Secondary | ICD-10-CM | POA: Diagnosis present

## 2024-01-22 DIAGNOSIS — Z79899 Other long term (current) drug therapy: Secondary | ICD-10-CM | POA: Diagnosis not present

## 2024-01-22 DIAGNOSIS — C50411 Malignant neoplasm of upper-outer quadrant of right female breast: Secondary | ICD-10-CM

## 2024-01-22 DIAGNOSIS — Z95828 Presence of other vascular implants and grafts: Secondary | ICD-10-CM

## 2024-01-22 MED ORDER — GOSERELIN ACETATE 3.6 MG ~~LOC~~ IMPL
3.6000 mg | DRUG_IMPLANT | Freq: Once | SUBCUTANEOUS | Status: AC
  Administered 2024-01-22: 3.6 mg via SUBCUTANEOUS
  Filled 2024-01-22: qty 3.6

## 2024-01-22 NOTE — Research (Signed)
 [id number]; RANDOMIZED [place]ACE[place] CONTROLLED TRIAL OF BUPROPION FOR CANCER RELATED FATIGUE  Patient Jenna Cross was identified by Dr. Gudena as a potential candidate for the above listed study.  This Clinical Research Nurse met with Jenna Cross, [id number], on 01/22/24 in a manner and location that ensures patient privacy to discuss participation in the above listed research study.  Patient is Unaccompanied.  A copy of the informed consent document and separate HIPAA Authorization was provided to the patient.  Patient reads, speaks, and understands [place].   Patient was provided with the business card of this Nurse and encouraged to contact the research team with any questions.  Approximately 5 minutes were spent with the patient reviewing the informed consent documents.  Patient was provided the option of taking informed consent documents home to review and was encouraged to review at their convenience with their support network, including other care providers. Patient took the consent documents home to review. Patient rated her fatigue at her worst level during the past week as a 7 out of 10 (0 is not at all fatigued and 10 is the worst).  Encouraged patient to contact research nurse if she has any questions or wants to enroll on this study.  Otherwise patient understands research nurse will follow up with her in one month at her next injection appointment.  Aurora Lees, BSN, RN, [employer] Research Nurse II [phone] 01/22/2024

## 2024-01-22 NOTE — Research (Signed)
 Trial: [id number]: COGNITIVE TRAINING FOR CANCER RELATED COGNITIVE IMPAIRMENT IN BREAST CANCER SURVIVORS: A MULTI-CENTER RANDOMIZED DOUBLE- BLINDED CONTROLLED TRIAL     Patient Jenna Cross was identified by Dr. Gudena as a potential candidate for the above listed study.  This Clinical Research Nurse met with Jenna Cross, [id number], on 01/22/24 in a manner and location that ensures patient privacy to discuss participation in the above listed research study.  Patient is Unaccompanied.  A copy of the informed consent document and separate HIPAA Authorization was provided to the patient.  Patient reads, speaks, and understands [place].   Patient was provided with the business card of this Nurse and encouraged to contact the research team with any questions.  Approximately 5 minutes were spent with the patient reviewing the informed consent documents.  Patient was provided the option of taking informed consent documents home to review and was encouraged to review at their convenience with their support network, including other care providers. Patient took the consent documents home to review.  Patient did not have time to complete the screening questions for this study today.  She is not sure she would have the time to dedicate to this study but was interested in taking the consent home to review. Encouraged patient to call research nurse if any questions. Otherwise this research nurse will plan to follow up with patient at her next clinic visit in one month.  She verbalized understanding.  Jenna Cross, BSN, RN, [employer] Research Nurse II [phone] 01/22/2024

## 2024-02-07 ENCOUNTER — Other Ambulatory Visit: Payer: Self-pay | Admitting: Hematology and Oncology

## 2024-02-25 ENCOUNTER — Telehealth: Payer: Self-pay | Admitting: *Deleted

## 2024-02-25 ENCOUNTER — Inpatient Hospital Stay

## 2024-02-25 NOTE — Telephone Encounter (Signed)
 [id number]: COGNITIVE TRAINING FOR CANCER RELATED COGNITIVE IMPAIRMENT IN BREAST CANCER SURVIVORS: A MULTI-CENTER RANDOMIZED DOUBLE- BLINDED CONTROLLED TRIAL   Called patient to follow up on the above study.  Patient states she does not think she has the time to commit to this study and will decline. Thanked patient for her time and Dr. Lee Public notified.  Aurora Lees, BSN, RN, [employer] Research Nurse II [phone] 02/25/2024 2:53 PM

## 2024-02-25 NOTE — Telephone Encounter (Signed)
 [id number]; RANDOMIZED [place]ACE[place] CONTROLLED TRIAL OF BUPROPION FOR CANCER RELATED FATIGUE  Called patient to follow up on the above study.  Patient states she is interested in enrolling. She is scheduled for breast reconstruction surgery next week so we will plan to consent and enroll on study after patient has time to recover.  Patient agreed for research nurse to meet her at her next injection appointment after her surgery on 03/24/24 to consent for the PEP study.  Aurora Lees, BSN, RN, [employer] Research Nurse II [phone] 02/25/2024 2:58 PM

## 2024-02-26 ENCOUNTER — Inpatient Hospital Stay: Attending: Hematology and Oncology

## 2024-02-26 VITALS — BP 111/58 | HR 77 | Temp 98.2°F | Resp 16

## 2024-02-26 DIAGNOSIS — Z5111 Encounter for antineoplastic chemotherapy: Secondary | ICD-10-CM | POA: Insufficient documentation

## 2024-02-26 DIAGNOSIS — Z170 Estrogen receptor positive status [ER+]: Secondary | ICD-10-CM | POA: Diagnosis present

## 2024-02-26 DIAGNOSIS — C50411 Malignant neoplasm of upper-outer quadrant of right female breast: Secondary | ICD-10-CM | POA: Insufficient documentation

## 2024-02-26 DIAGNOSIS — Z95828 Presence of other vascular implants and grafts: Secondary | ICD-10-CM

## 2024-02-26 MED ORDER — GOSERELIN ACETATE 3.6 MG ~~LOC~~ IMPL
3.6000 mg | DRUG_IMPLANT | Freq: Once | SUBCUTANEOUS | Status: AC
  Administered 2024-02-26: 3.6 mg via SUBCUTANEOUS
  Filled 2024-02-26: qty 3.6

## 2024-03-16 ENCOUNTER — Encounter: Payer: Self-pay | Admitting: Hematology and Oncology

## 2024-03-18 ENCOUNTER — Telehealth: Payer: Self-pay | Admitting: *Deleted

## 2024-03-18 NOTE — Telephone Encounter (Signed)
 [id number]; RANDOMIZED PLACEBO CONTROLLED TRIAL OF BUPROPION FOR CANCER RELATED FATIGUE   Called patient to confirm she wants to meet next week on 6/17 to consent for the above study. Patient confirmed and reports her fatigue in the past week has been a 7/10.  Reviewed with patient the study assessments required after consent include baseline questionnaires, blood draw and saliva collection at home. Patient will also be required to keep a medication diary at home. We can get consent next week and then we can order the study drug after patient is randomized on the study.  Patient would need to come back to the clinic after randomization to complete the blood collection and questionnaires. She would also get the study drug to take home along with saliva collection kit and medication diary.  She verbalized understanding of this plan and agreed to meet with research next week at 12:30 pm prior to injection appointment .  Thanked patient for her time and willingness to participate in this study.  Aurora Lees, BSN, RN, [employer] Research Nurse II [phone] 03/18/2024 3:53 PM

## 2024-03-24 ENCOUNTER — Inpatient Hospital Stay: Admitting: *Deleted

## 2024-03-24 ENCOUNTER — Inpatient Hospital Stay: Attending: Hematology and Oncology

## 2024-03-24 ENCOUNTER — Telehealth: Payer: Self-pay

## 2024-03-24 VITALS — BP 113/47 | HR 101 | Temp 98.7°F | Resp 16

## 2024-03-24 DIAGNOSIS — Z170 Estrogen receptor positive status [ER+]: Secondary | ICD-10-CM | POA: Insufficient documentation

## 2024-03-24 DIAGNOSIS — C50411 Malignant neoplasm of upper-outer quadrant of right female breast: Secondary | ICD-10-CM | POA: Diagnosis present

## 2024-03-24 DIAGNOSIS — Z5111 Encounter for antineoplastic chemotherapy: Secondary | ICD-10-CM | POA: Insufficient documentation

## 2024-03-24 DIAGNOSIS — Z95828 Presence of other vascular implants and grafts: Secondary | ICD-10-CM

## 2024-03-24 MED ORDER — GOSERELIN ACETATE 3.6 MG ~~LOC~~ IMPL
3.6000 mg | DRUG_IMPLANT | Freq: Once | SUBCUTANEOUS | Status: AC
  Administered 2024-03-24: 3.6 mg via SUBCUTANEOUS
  Filled 2024-03-24: qty 3.6

## 2024-03-24 NOTE — Research (Signed)
 Trial Name:  [id number]; RANDOMIZED PLACEBO CONTROLLED TRIAL OF BUPROPION FOR CANCER RELATED FATIGUE   Patient Jenna Cross was identified by Dr. Gudena as a potential candidate for the above listed study.  This Clinical Research Nurse met with Jenna Cross, [id number] on 03/24/24 in a manner and location that ensures patient privacy to discuss participation in the above listed research study.  Patient is Unaccompanied.  Patient was previously provided with informed consent documents.  Patient has not yet read the informed consent documents and so documents were reviewed page by page today.  As outlined in the informed consent form, this Nurse and Tory Freiberg discussed the purpose of the research study, the investigational nature of the study, study procedures and requirements for study participation, potential risks and benefits of study participation, as well as alternatives to participation.  This study is blinded or double-blinded. The patient understands participation is voluntary and they may withdraw from study participation at any time.  Each study arm was reviewed, and randomization discussed.  Potential side effects were reviewed with patient as outlined in the consent form, and patient made aware there may be side effects not yet known. The chance of receiving placebo was discussed. Patient understands enrollment is pending full eligibility review.   Confidentiality and how the patient's information will be used as part of study participation were discussed.  Patient was informed there is reimbursement provided for their time and effort spent on trial participation.  The patient is encouraged to discuss research study participation with their insurance provider to determine what costs they may incur as part of study participation, including research related injury.    All questions were answered to patient's satisfaction.  The informed consent and separate HIPAA Authorization  was reviewed page by page.  The patient's mental and emotional status is appropriate to provide informed consent, and the patient verbalizes an understanding of study participation.  Patient has agreed to participate in the above listed research study and has voluntarily signed the informed consent protocol version date 12/03/23  and separate HIPAA Authorization, version [place] [facility] approved 10/24/2023  on 03/24/24 at 1:13PM.  The patient was provided with a copy of the signed informed consent form and separate HIPAA Authorization for their reference.  No study specific procedures were obtained prior to the signing of the informed consent document.  Approximately 30 minutes were spent with the patient reviewing the informed consent documents.  Patient was not requested to complete a Release of Information form.  Reviewed eligibility criteria with patient and confirmed the following:  Worst level of fatigue in the past week is moderate to severe (I.e. >/= 4 of 10) Yes Rating: 8 Currently not pregnant or breast-feeding Yes Women of child-bearing potential must agree to use adequate contraception (I.e. abstinence, IUD, hormonal contraception, barrier method/condoms prior to study entry & for duration of study Yes Not taking any medications containing bupropion (e.g. Wellbutrin, Forfivo, Aplenzin, Zyban) Yes Not taking MAOI, linezolid, or methylene blue within 2 weeks prior to enrollment Yes Not on any anti-psychotic medications within a week of enrollment Yes No history of renal impairment (I.e. GFR <45), cirrhosis, seizures, bulimia/anorexia nervosa Yes No allergy to lactose or sensitivity to bupropion Yes No psychiatric or neurological disorder Yes   Plan: Eligibility will be confirmed and patient will be registered/randomized the above study this week. After patient is randomized, research will request study drug ordered/delivered by [facility]. Patient agreed to come in next week on Tues 6/24 at 9:30  am  to complete Baseline research labs and PROs for study.  Research will also provide the Baseline saliva collection kit and dispense study drug to patient to take home at that time. Patient agreed to this plan and to contact research nurse if any questions or if she needs to change the appointment. Thanked patient for her time and participation in this study.  Aurora Lees, BSN, RN, [employer] Research Nurse II [phone] 03/24/2024

## 2024-03-24 NOTE — Progress Notes (Signed)
 Patient here for Zoladex  injection.  States three weeks ago she had fat removed from stomach and moved to her chest for her implants.  C/O soreness, tenderness and swelling in stomach.  Denies any drainage or redness.  Secure chatted Dr. Gudena, Melissa/pharmacist and Ginna/pharmacist.  Lovett Ruck states ok to give in outer thigh or gluteus.  Injection given in left outer thigh.

## 2024-03-24 NOTE — Telephone Encounter (Signed)
Called pt per MD to advise Signatera testing was negative/not detected. Pt verbalized understanding of results and knows [facility] will be in touch to schedule 3 mo repeat lab.   

## 2024-03-26 ENCOUNTER — Other Ambulatory Visit: Payer: Self-pay | Admitting: Hematology and Oncology

## 2024-03-26 ENCOUNTER — Encounter: Payer: Self-pay | Admitting: *Deleted

## 2024-03-26 ENCOUNTER — Other Ambulatory Visit: Payer: Self-pay | Admitting: *Deleted

## 2024-03-26 ENCOUNTER — Encounter: Payer: Self-pay | Admitting: Hematology and Oncology

## 2024-03-26 NOTE — Progress Notes (Unsigned)
 URCC 18007: RANDOMIZED [place]ACEBO CONTROLLED TRIAL OF BUPROPION FOR CANCER RELATED FATIGUE   This Nurse has reviewed this patient's inclusion and exclusion criteria as a second review and confirms Jenna Cross is eligible for study participation.  Patient may continue with enrollment.  Victory Gravel Shondale Quinley, RN, BSN, [facility] Clinical Research Nurse [facility] Direct Dial [phone] 03/26/2024 10:35 AM

## 2024-03-26 NOTE — Progress Notes (Signed)
 [id number]; RANDOMIZED PLACEBO CONTROLLED TRIAL OF BUPROPION FOR CANCER RELATED FATIGUE   This Nurse has reviewed this patient's inclusion and exclusion criteria and confirmed Jenna Cross is eligible for study participation.  Patient will continue with enrollment.  Eligibility confirmed by Dr. Arno Bibles, covering physician for Dr. Gudena the treating investigator, who also agrees that patient should proceed with enrollment.  Aurora Lees, BSN, RN, [employer] Research Nurse II [phone] 03/26/2024 10:33 AM

## 2024-03-27 ENCOUNTER — Other Ambulatory Visit: Payer: Self-pay

## 2024-03-27 ENCOUNTER — Other Ambulatory Visit: Payer: Self-pay | Admitting: *Deleted

## 2024-03-27 ENCOUNTER — Encounter: Payer: Self-pay | Admitting: Hematology and Oncology

## 2024-03-30 ENCOUNTER — Encounter: Payer: Self-pay | Admitting: Hematology and Oncology

## 2024-03-30 ENCOUNTER — Telehealth: Payer: Self-pay | Admitting: *Deleted

## 2024-03-30 NOTE — Telephone Encounter (Addendum)
 [id number]; RANDOMIZED PLACEBO CONTROLLED TRIAL OF BUPROPION FOR CANCER RELATED FATIGUE  Patient called to inform research nurse that after discussing the study with her husband and thinking about it more over the weekend, she has decided she does not want to participate in the study. She says she does not feel she has enough time to complete the study assessments; the questionnaires, saliva collections, medication diary, etc.. She feels it is too time consuming.  Patient agreed that the research team may collect her data from medical and share with the study if requested. She just wants to withdraw from the study intervention and assessments. Patient was apologetic for withdrawing from the study. Thanked patient for informing this research nurse of her decision and assured her it is her right to be able to withdraw from the study at any point.  Encouraged patient to contact research nurse if any questions in the future.  Email sent to [facility] and [facility] to notify of patient's withdrawal. Dr. Odean notified.  Cherylyn Hoard, BSN, RN, [employer] Research Nurse II [phone] 03/30/2024 10:18 AM

## 2024-03-31 ENCOUNTER — Inpatient Hospital Stay

## 2024-03-31 ENCOUNTER — Inpatient Hospital Stay: Admitting: *Deleted

## 2024-04-01 LAB — SIGNATERA
SIGNATERA MTM READOUT: 0 MTM/ml
SIGNATERA TEST RESULT: NEGATIVE

## 2024-04-21 ENCOUNTER — Encounter: Payer: Self-pay | Admitting: *Deleted

## 2024-04-21 ENCOUNTER — Inpatient Hospital Stay: Attending: Hematology and Oncology

## 2024-04-21 VITALS — BP 115/70 | HR 97 | Temp 98.4°F | Resp 16

## 2024-04-21 DIAGNOSIS — Z5111 Encounter for antineoplastic chemotherapy: Secondary | ICD-10-CM | POA: Insufficient documentation

## 2024-04-21 DIAGNOSIS — C50411 Malignant neoplasm of upper-outer quadrant of right female breast: Secondary | ICD-10-CM | POA: Insufficient documentation

## 2024-04-21 DIAGNOSIS — Z79899 Other long term (current) drug therapy: Secondary | ICD-10-CM | POA: Diagnosis not present

## 2024-04-21 DIAGNOSIS — Z170 Estrogen receptor positive status [ER+]: Secondary | ICD-10-CM | POA: Insufficient documentation

## 2024-04-21 DIAGNOSIS — Z95828 Presence of other vascular implants and grafts: Secondary | ICD-10-CM

## 2024-04-21 MED ORDER — GOSERELIN ACETATE 3.6 MG ~~LOC~~ IMPL
3.6000 mg | DRUG_IMPLANT | Freq: Once | SUBCUTANEOUS | Status: AC
  Administered 2024-04-21: 3.6 mg via SUBCUTANEOUS
  Filled 2024-04-21: qty 3.6

## 2024-04-24 ENCOUNTER — Encounter: Payer: Self-pay | Admitting: Hematology and Oncology

## 2024-04-29 ENCOUNTER — Inpatient Hospital Stay: Admitting: Hematology and Oncology

## 2024-04-29 ENCOUNTER — Inpatient Hospital Stay: Admitting: Adult Health

## 2024-04-29 DIAGNOSIS — Z170 Estrogen receptor positive status [ER+]: Secondary | ICD-10-CM

## 2024-04-29 DIAGNOSIS — C50411 Malignant neoplasm of upper-outer quadrant of right female breast: Secondary | ICD-10-CM | POA: Diagnosis not present

## 2024-04-29 MED ORDER — BUPROPION HCL ER (XL) 150 MG PO TB24: 150.0000 mg | ORAL_TABLET | Freq: Every day | ORAL | 6 refills | Status: DC

## 2024-04-29 NOTE — Progress Notes (Signed)
 HEMATOLOGY-ONCOLOGY TELEPHONE VISIT PROGRESS NOTE  I connected with our patient on 04/29/24 at  3:30 PM EDT by telephone and verified that I am speaking with the correct person using two identifiers.  I discussed the limitations, risks, security and privacy concerns of performing an evaluation and management service by telephone and the availability of in person appointments.  I also discussed with the patient that there may be a patient responsible charge related to this service. The patient expressed understanding and agreed to proceed.   History of Present Illness: Telephone follow-up to discuss issues with anxiety, sleep and hot flashes  History of Present Illness Jenna Cross is a 38 year old female who presents with anxiety and sleep disturbances.  Jenna Cross experiences increasing anxiety and difficulty sleeping. Despite being on Effexor  for hot flashes, her anxiety persists. Sleep medications cause next-day drowsiness.  She recently increased her GLP-1 medication dose for weight management and inflammation, which worsened her anxiety and sleep issues, prompting her to return to the lower dose. She notes improvement in gastrointestinal symptoms and bloating with the GLP-1 medication.  She experiences fatigue, which she associates with her anxiety and sleep disturbances. She is undergoing reconstruction following surgery in May, contributing to her emotional and physical stress.    Oncology History  Malignant neoplasm of upper-outer quadrant of right breast in female, estrogen receptor positive (HCC)  06/23/2021 Initial Diagnosis   Palpable lump in the right breast. Diagnostic mammogram and US : a 3.8 cm mass at the retroareolar right breast, 1 prominent right axillary lymph node with a 4 mm cortex, 1 other which is borderline measuring 3 mm. 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  3 o'clock position: Biopsy  fibroadenoma   07/12/2021 -  Neo-Adjuvant Chemotherapy   Neoadjuvant chemotherapy with [facility] Perjeta  6 cycles followed by Herceptin  Perjeta  maintenance versus Kadcyla  maintenance (based on response to neoadjuvant chemo) for 1 year   07/28/2021 Genetic Testing   Negative hereditary cancer genetic testing: no pathogenic variants detected in [facility] CancerNext-Expanded +RNAinsight Panel.  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  The report date is July 28, 2021.   The CancerNext-Expanded gene panel offered by [facility] and includes sequencing, rearrangement, and RNA analysis for the following 77 genes: AIP, ALK, APC, ATM, AXIN2, BAP1, BARD1, BLM, BMPR1A, BRCA1, BRCA2, BRIP1, CDC73, CDH1, CDK4, CDKN1B, CDKN2A, CHEK2, CTNNA1, DICER1, FANCC, FH, FLCN, GALNT12, KIF1B, LZTR1, MAX, MEN1, MET, MLH1, MSH2, MSH3, MSH6, MUTYH, NBN, NF1, NF2, NTHL1, PALB2, PHOX2B, PMS2, POT1, PRKAR1A, PTCH1, PTEN, RAD51C, RAD51D, RB1, RECQL, RET, SDHA, SDHAF2, SDHB, SDHC, SDHD, SMAD4, SMARCA4, SMARCB1, SMARCE1, STK11, SUFU, TMEM127, TP53, TSC1, TSC2, VHL and XRCC2 (sequencing and deletion/duplication); EGFR, EGLN1, HOXB13, KIT, MITF, PDGFRA, POLD1, and POLE (sequencing only); EPCAM and GREM1 (deletion/duplication only).    12/06/2021 Surgery   Bilateral Mastectomies: Left mastectomy: Benign Right mastectomy: Multifocal ILC 1.8 cm, 0.9 cm, margins negative, 1/2 lymph nodes positive, ER 50%, PR 30%, HER2 1+ IHC   12/26/2021 - 12/29/2021 Chemotherapy   Patient is on Treatment Plan : BREAST ADO-Trastuzumab Emtansine  (Kadcyla ) q21d     04/04/2024 - 04/04/2024 Chemotherapy   Patient is on Treatment Plan : BREAST (RESEARCH) [phone] BUPROPION /PLACEBO       REVIEW OF SYSTEMS:   Constitutional: Denies fevers, chills or abnormal weight loss All other systems were reviewed with the patient and are negative. Observations/Objective:     Assessment Plan:  Malignant neoplasm of upper-outer  quadrant of right  breast in female, estrogen receptor positive (HCC) Palpable lump in the right breast. Diagnostic mammogram and US : a 3.8 cm mass at the retroareolar right breast, 1 prominent right axillary lymph node with a 4 mm cortex, 1 other which is borderline measuring 3 mm. 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  3 o'clock position: Biopsy fibroadenoma   Treatment plan: 1. Neoadjuvant chemotherapy with [facility] Perjeta  6 cycles followed by Herceptin  Perjeta  maintenance versus Kadcyla  maintenance (based on response to neoadjuvant chemo) for 1 year 2. Bilateral Mastectomies: Left mastectomy: Benign Right mastectomy: Multifocal ILC 1.8 cm, 0.9 cm, margins negative, 1/2 lymph nodes positive, ER 50%, PR 30%, HER2 1+ IHC 3.  Compass HER2 clinical trial: Randomization between Kadcyla  versus Kadcyla  plus Tucatinib  completed 11/23/2022, last Tucatinib  12/13/2022 4. Followed by antiestrogen therapy with Zoladex  and letrozole    ------------------------------------------------------------------------------------------------------------------------ Current treatment: Zoladex  with letrozole  Hospitalization 01/22/2022-01/26/2022 abscess/cellulitis required removal of the implant Latissimus dorsi reconstruction April 2024 Breast expanders August 2024 Implant placement November 2024   Antiestrogen therapy adverse effects: Tolerating it well   Continue monthly Zoladex  injections for ovarian function suppression    She plans to do oophorectomy once she recovers from her breast reconstruction.  She wants to do a hysterectomy with BSO accompanied by tummy tuck operation.   Signatera testing: Negative  Anxiety: add Wellbutrin  150 mg and slowly decrease Effexor  by cutting it down to every other day and hopefully discontinuing by her September appointment.  Return to clinic in September for follow-up.  She will come monthly for Zoladex   injections until she undergoes oophorectomy.  --------------------------------- Assessment and Plan Assessment & Plan Anxiety Anxiety increasing, current Venlafaxine  XR inadequate. - Initiate Bupropion  150 mg oral daily in the morning. - Continue Venlafaxine  XR for one month, then taper by taking it every other day.  Sleep disturbance Sleep disturbances ongoing, likely exacerbated by anxiety. - Implement guided imagery and use sleep apps. - Maintain consistent sleep routine.  Fatigue Persistent fatigue possibly related to anxiety and sleep disturbances. - Consider Bupropion  to address fatigue along with anxiety and hot flashes.  Hot flashes Hot flashes well-controlled with Venlafaxine  XR, transition to Bupropion  may help.  Planned oophorectomy and hysterectomy Plans for full hysterectomy, including oophorectomy, to eliminate need for ongoing injections and reduce anxiety. - Proceed with scheduling a full hysterectomy, including oophorectomy, in the fall as discussed with OB.      I discussed the assessment and treatment plan with the patient. The patient was provided an opportunity to ask questions and all were answered. The patient agreed with the plan and demonstrated an understanding of the instructions. The patient was advised to call back or seek an in-person evaluation if the symptoms worsen or if the condition fails to improve as anticipated.   I provided 20 minutes of non-face-to-face time during this encounter.  This includes time for charting and coordination of care   Naomi MARLA Chad, MD

## 2024-04-29 NOTE — Assessment & Plan Note (Signed)
 Palpable lump in the right breast. Diagnostic mammogram and US : a 3.8 cm mass at the retroareolar right breast, 1 prominent right axillary lymph node with a 4 mm cortex, 1 other which is borderline measuring 3 mm. 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  3 o'clock position: Biopsy fibroadenoma   Treatment plan: 1. Neoadjuvant chemotherapy with [facility] Perjeta  6 cycles followed by Herceptin  Perjeta  maintenance versus Kadcyla  maintenance (based on response to neoadjuvant chemo) for 1 year 2. Bilateral Mastectomies: Left mastectomy: Benign Right mastectomy: Multifocal ILC 1.8 cm, 0.9 cm, margins negative, 1/2 lymph nodes positive, ER 50%, PR 30%, HER2 1+ IHC 3.  Compass HER2 clinical trial: Randomization between Kadcyla  versus Kadcyla  plus Tucatinib  completed 11/23/2022, last Tucatinib  12/13/2022 4. Followed by antiestrogen therapy with Zoladex  and letrozole    ------------------------------------------------------------------------------------------------------------------------ Current treatment: Zoladex  with letrozole  Hospitalization 01/22/2022-01/26/2022 abscess/cellulitis required removal of the implant Latissimus dorsi reconstruction April 2024 Breast expanders August 2024 Implant placement November 2024   Antiestrogen therapy adverse effects: Tolerating it well   Continue monthly Zoladex  injections for ovarian function suppression    She plans to do oophorectomy once she recovers from her breast reconstruction.  She wants to do a hysterectomy with BSO accompanied by tummy tuck operation.   Signatera testing: Negative  Anxiety: add Wellbutrin  150 mg and slowly decrease Effexor  by cutting it down to every other day and hopefully discontinuing by her September appointment.  Return to clinic in 6 months for follow-up.  She will come monthly for Zoladex  injections until she undergoes oophorectomy.

## 2024-05-22 ENCOUNTER — Inpatient Hospital Stay: Attending: Hematology and Oncology

## 2024-05-22 ENCOUNTER — Other Ambulatory Visit: Payer: Self-pay | Admitting: Hematology and Oncology

## 2024-05-22 VITALS — BP 93/57 | HR 99 | Resp 16

## 2024-05-22 DIAGNOSIS — Z5111 Encounter for antineoplastic chemotherapy: Secondary | ICD-10-CM | POA: Diagnosis present

## 2024-05-22 DIAGNOSIS — Z170 Estrogen receptor positive status [ER+]: Secondary | ICD-10-CM | POA: Diagnosis present

## 2024-05-22 DIAGNOSIS — Z95828 Presence of other vascular implants and grafts: Secondary | ICD-10-CM

## 2024-05-22 DIAGNOSIS — C50411 Malignant neoplasm of upper-outer quadrant of right female breast: Secondary | ICD-10-CM | POA: Insufficient documentation

## 2024-05-22 MED ORDER — GOSERELIN ACETATE 3.6 MG ~~LOC~~ IMPL
3.6000 mg | DRUG_IMPLANT | Freq: Once | SUBCUTANEOUS | Status: AC
  Administered 2024-05-22: 3.6 mg via SUBCUTANEOUS
  Filled 2024-05-22: qty 3.6

## 2024-05-26 ENCOUNTER — Inpatient Hospital Stay

## 2024-05-31 ENCOUNTER — Encounter: Payer: Self-pay | Admitting: Hematology and Oncology

## 2024-06-09 ENCOUNTER — Encounter: Payer: Self-pay | Admitting: *Deleted

## 2024-06-09 ENCOUNTER — Inpatient Hospital Stay: Attending: Hematology and Oncology | Admitting: Hematology and Oncology

## 2024-06-09 ENCOUNTER — Other Ambulatory Visit: Payer: Self-pay | Admitting: *Deleted

## 2024-06-09 VITALS — BP 100/55 | HR 87 | Temp 97.9°F | Resp 16 | Wt 125.5 lb

## 2024-06-09 DIAGNOSIS — C50411 Malignant neoplasm of upper-outer quadrant of right female breast: Secondary | ICD-10-CM | POA: Diagnosis not present

## 2024-06-09 DIAGNOSIS — F419 Anxiety disorder, unspecified: Secondary | ICD-10-CM | POA: Diagnosis not present

## 2024-06-09 DIAGNOSIS — Z170 Estrogen receptor positive status [ER+]: Secondary | ICD-10-CM | POA: Diagnosis not present

## 2024-06-09 DIAGNOSIS — F411 Generalized anxiety disorder: Secondary | ICD-10-CM

## 2024-06-09 DIAGNOSIS — Z5111 Encounter for antineoplastic chemotherapy: Secondary | ICD-10-CM | POA: Diagnosis present

## 2024-06-09 DIAGNOSIS — R530 Neoplastic (malignant) related fatigue: Secondary | ICD-10-CM | POA: Insufficient documentation

## 2024-06-09 DIAGNOSIS — N951 Menopausal and female climacteric states: Secondary | ICD-10-CM | POA: Diagnosis not present

## 2024-06-09 NOTE — Research (Unsigned)
 CompassHER2 Residual Disease, A Double-Blinded, Phase III Randomized Trial of T-DM1 and Placebo Compared with T-DMI and Tucatinib   Patient presented to clinic unaccompanied for Clinical Follow Up- Visit #17  Concomitant Medications: Reviewed medication list with patient. She continues to take letrozole  daily as prescribed and is receiving goserelin injections monthly ongoing. She is not taking any medications for hepatotoxicity or diarrhea. She is not taking any new treatment for cancer since last study visit.   H&P: See Dr. Gara note from today.   Adverse Events: No serious AEs reported since end of treatment.  Patient has planned Hysterectomy with Salpingo-oophorectomy scheduled for October and Dr. Odean states this is unrelated to any study treatment.  PLAN: Next follow up visit with Dr. Gudena is due in 6 months (+/-45 days). Patient has been provided direct contact information and is encouraged to contact this nurse for any questions or concerns prior to next visit. She verbalized understanding.  Thanked patient for her participation on this clinical trial.   Cherylyn Hoard, BSN, RN, CCRP Clinical Research Nurse II [phone] 06/09/2024

## 2024-06-09 NOTE — Progress Notes (Signed)
 Patient Care Team: Mat Browning, MD as PCP - General (Obstetrics and Gynecology) Mat Browning, MD (Obstetrics and Gynecology) Glean Stephane BROCKS, RN (Inactive) as Oncology Nurse Navigator Tyree Nanetta SAILOR, RN as Oncology Nurse Navigator Odean Potts, MD as Consulting Physician (Hematology and Oncology) Shannon Agent, MD as Consulting Physician (Radiation Oncology) Ebbie Cough, MD as Consulting Physician (General Surgery)  DIAGNOSIS:  Encounter Diagnosis  Name Primary?   Malignant neoplasm of upper-outer quadrant of right breast in female, estrogen receptor positive (HCC) Yes    SUMMARY OF ONCOLOGIC HISTORY: Oncology History  Malignant neoplasm of upper-outer quadrant of right breast in female, estrogen receptor positive (HCC)  06/23/2021 Initial Diagnosis   Palpable lump in the right breast. Diagnostic mammogram and US : a 3.8 cm mass at the retroareolar right breast, 1 prominent right axillary lymph node with a 4 mm cortex, 1 other which is borderline measuring 3 mm. 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  3 o'clock position: Biopsy fibroadenoma   07/12/2021 -  Neo-Adjuvant Chemotherapy   Neoadjuvant chemotherapy with [facility] Perjeta  6 cycles followed by Herceptin  Perjeta  maintenance versus Kadcyla  maintenance (based on response to neoadjuvant chemo) for 1 year   07/28/2021 Genetic Testing   Negative hereditary cancer genetic testing: no pathogenic variants detected in [facility] CancerNext-Expanded +RNAinsight Panel.  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  The report date is July 28, 2021.   The CancerNext-Expanded gene panel offered by [facility] and includes sequencing, rearrangement, and RNA analysis for the following 77 genes: AIP, ALK, APC, ATM, AXIN2, BAP1, BARD1, BLM, BMPR1A, BRCA1, BRCA2, BRIP1, CDC73, CDH1, CDK4, CDKN1B, CDKN2A, CHEK2, CTNNA1, DICER1,  FANCC, FH, FLCN, GALNT12, KIF1B, LZTR1, MAX, MEN1, MET, MLH1, MSH2, MSH3, MSH6, MUTYH, NBN, NF1, NF2, NTHL1, PALB2, PHOX2B, PMS2, POT1, PRKAR1A, PTCH1, PTEN, RAD51C, RAD51D, RB1, RECQL, RET, SDHA, SDHAF2, SDHB, SDHC, SDHD, SMAD4, SMARCA4, SMARCB1, SMARCE1, STK11, SUFU, TMEM127, TP53, TSC1, TSC2, VHL and XRCC2 (sequencing and deletion/duplication); EGFR, EGLN1, HOXB13, KIT, MITF, PDGFRA, POLD1, and POLE (sequencing only); EPCAM and GREM1 (deletion/duplication only).    12/06/2021 Surgery   Bilateral Mastectomies: Left mastectomy: Benign Right mastectomy: Multifocal ILC 1.8 cm, 0.9 cm, margins negative, 1/2 lymph nodes positive, ER 50%, PR 30%, HER2 1+ IHC   12/26/2021 - 12/29/2021 Chemotherapy   Patient is on Treatment Plan : BREAST ADO-Trastuzumab Emtansine  (Kadcyla ) q21d     04/04/2024 - 04/04/2024 Chemotherapy   Patient is on Treatment Plan : BREAST (RESEARCH) [phone] BUPROPION /PLACEBO       CHIEF COMPLIANT:   HISTORY OF PRESENT ILLNESS:  History of Present Illness Jenna Cross is a 38 year old female who presents for medication management and follow-up on her reconstruction and upcoming hysterectomy.  She discontinued Wellbutrin  due to adverse effects and resumed Effexor  37.5 mg, which alleviates her hot flashes but may contribute to fatigue. She is considering additional medication for anxiety management.  She underwent fat grafting in May and anticipates one more session. A hysterectomy, including oophorectomy, is scheduled for the end of October.  She prefers Signatera testing every three months, with the last test conducted on June 25.     ALLERGIES:  is allergic to wound dressings.  MEDICATIONS:  Current Outpatient Medications  Medication Sig Dispense Refill   acetaminophen  (TYLENOL ) 500 MG tablet Take 1,000 mg by mouth every 6 (six) hours as needed for mild pain or headache.     ALPRAZolam  (XANAX ) 0.5 MG tablet Take 0.5 mg by mouth  3 (three) times daily as needed for  anxiety.     buPROPion  (WELLBUTRIN  XL) 150 MG 24 hr tablet TAKE 1 TABLET BY MOUTH EVERY DAY 90 tablet 3   Goserelin Acetate  (ZOLADEX  [place])      letrozole  (FEMARA ) 2.5 MG tablet TAKE 1 TABLET BY MOUTH EVERY DAY 90 tablet 3   loratadine  (CLARITIN ) 10 MG tablet Take 10 mg by mouth daily as needed for allergies.     ondansetron  (ZOFRAN ) 8 MG tablet TAKE 1 TABLET BY MOUTH EVERY 8 HOURS AS NEEDED FOR NAUSEA 18 tablet 6   tirzepatide (MOUNJARO) 2.5 MG/0.5ML Pen Inject 2.5 mg into the skin once a week.     tretinoin (RETIN-A) 0.025 % cream Apply topically at bedtime.     valACYclovir  (VALTREX ) 500 MG tablet Take 500 mg by mouth every evening.     venlafaxine  XR (EFFEXOR -XR) 37.5 MG 24 hr capsule TAKE 1 CAPSULE BY MOUTH DAILY WITH BREAKFAST. 90 capsule 1   No current facility-administered medications for this visit.    PHYSICAL EXAMINATION: ECOG PERFORMANCE STATUS: 1 - Symptomatic but completely ambulatory  Vitals:   06/09/24 1504  BP: (!) 100/55  Pulse: 87  Resp: 16  Temp: 97.9 F (36.6 C)  SpO2: 100%   Filed Weights   06/09/24 1504  Weight: 125 lb 8 oz (56.9 kg)    Physical Exam   (exam performed in the presence of a chaperone)  LABORATORY DATA:  I have reviewed the data as listed    Latest Ref Rng & Units 03/25/2023    2:55 PM 01/09/2023    9:09 AM 11/23/2022    8:25 AM  CMP  Glucose 70 - 99 mg/dL 897  91  92   BUN 6 - 20 mg/dL 10  11  12    Creatinine 0.44 - 1.00 mg/dL 9.40  9.28  9.11   Sodium 135 - 145 mmol/L 137  139  140   Potassium 3.5 - 5.1 mmol/L 3.6  4.0  3.5   Chloride 98 - 111 mmol/L 104  108  106   CO2 22 - 32 mmol/L 25  25  27    Calcium  8.9 - 10.3 mg/dL 9.4  9.4  9.2   Total Protein 6.5 - 8.1 g/dL 6.8  7.0  7.0   Total Bilirubin 0.3 - 1.2 mg/dL 0.5  0.5  0.4   Alkaline Phos 38 - 126 U/L 122  123  152   AST 15 - 41 U/L 14  21  26    ALT 0 - 44 U/L 11  14  23      Lab Results  Component Value Date   WBC 13.2 (H) 03/25/2023   HGB 12.8 03/25/2023   HCT 36.4  03/25/2023   MCV 90.3 03/25/2023   PLT 263 03/25/2023   NEUTROABS 10.3 (H) 03/25/2023    ASSESSMENT & PLAN:  Malignant neoplasm of upper-outer quadrant of right breast in female, estrogen receptor positive (HCC) Palpable lump in the right breast. Diagnostic mammogram and US : a 3.8 cm mass at the retroareolar right breast, 1 prominent right axillary lymph node with a 4 mm cortex, 1 other which is borderline measuring 3 mm. 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  3 o'clock position: Biopsy fibroadenoma   Treatment plan: 1. Neoadjuvant chemotherapy with [facility] Perjeta  6 cycles followed by Herceptin  Perjeta  maintenance versus Kadcyla  maintenance (based on response to neoadjuvant chemo) for 1 year 2. Bilateral Mastectomies: Left mastectomy: Benign Right mastectomy:  Multifocal ILC 1.8 cm, 0.9 cm, margins negative, 1/2 lymph nodes positive, ER 50%, PR 30%, HER2 1+ IHC 3.  Compass HER2 clinical trial: Randomization between Kadcyla  versus Kadcyla  plus Tucatinib  completed 11/23/2022, last Tucatinib  12/13/2022 4. Followed by antiestrogen therapy with Zoladex  and letrozole    ------------------------------------------------------------------------------------------------------------------------ Current treatment: Zoladex  with letrozole  Hospitalization 01/22/2022-01/26/2022 abscess/cellulitis required removal of the implant Latissimus dorsi reconstruction April 2024 Breast expanders August 2024 Implant placement November 2024   Antiestrogen therapy adverse effects: Tolerating it well   Continue monthly Zoladex  injections for ovarian function suppression    She plans to do oophorectomy once she recovers from her breast reconstruction.  She wants to do a hysterectomy with BSO accompanied by tummy tuck operation.   Signatera testing: Negative   Anxiety: Currently on Effexor .  We will refer her to psychiatry to help with  adjustment of her anxiety drugs..   Return to clinic in September for follow-up.  She will come monthly for Zoladex  injections until she undergoes oophorectomy.  Assessment & Plan ER-positive right breast cancer, status post treatment Status post treatment for ER-positive right breast cancer. Currently undergoing reconstruction with one more fat grafting session planned. Hysterectomy, including removal of the ovaries, scheduled for the end of October. - Proceed with scheduled hysterectomy at the end of October. - Continue with one more fat grafting session for reconstruction.  Hot flashes secondary to cancer therapy Hot flashes managed with Effexor , which is effective. Attempted discontinuation led to return of symptoms, indicating necessity for symptom control. Current dose is 37.5 mg, which is lower than the typical 150 mg dose. - Continue Effexor  for management of hot flashes.  Fatigue secondary to cancer therapy Fatigue is multifactorial, potentially exacerbated by Effexor  and cancer therapy. - Consider adjusting Effexor  dosage if fatigue becomes unmanageable.  Anxiety Anxiety persists, exacerbated by previous use of Wellbutrin . Interest in exploring additional anti-anxiety medication options. Referral to psychiatry is appropriate to avoid overuse of medication pathways. - Refer to psychiatry for evaluation and management of anxiety.      No orders of the defined types were placed in this encounter.  The patient has a good understanding of the overall plan. she agrees with it. she will call with any problems that may develop before the next visit here. Total time spent: 30 mins including face to face time and time spent for planning, charting and co-ordination of care   Naomi MARLA Chad, MD 06/09/24

## 2024-06-09 NOTE — Assessment & Plan Note (Signed)
 Palpable lump in the right breast. Diagnostic mammogram and US : a 3.8 cm mass at the retroareolar right breast, 1 prominent right axillary lymph node with a 4 mm cortex, 1 other which is borderline measuring 3 mm. 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  3 o'clock position: Biopsy fibroadenoma   Treatment plan: 1. Neoadjuvant chemotherapy with [facility] Perjeta  6 cycles followed by Herceptin  Perjeta  maintenance versus Kadcyla  maintenance (based on response to neoadjuvant chemo) for 1 year 2. Bilateral Mastectomies: Left mastectomy: Benign Right mastectomy: Multifocal ILC 1.8 cm, 0.9 cm, margins negative, 1/2 lymph nodes positive, ER 50%, PR 30%, HER2 1+ IHC 3.  Compass HER2 clinical trial: Randomization between Kadcyla  versus Kadcyla  plus Tucatinib  completed 11/23/2022, last Tucatinib  12/13/2022 4. Followed by antiestrogen therapy with Zoladex  and letrozole    ------------------------------------------------------------------------------------------------------------------------ Current treatment: Zoladex  with letrozole  Hospitalization 01/22/2022-01/26/2022 abscess/cellulitis required removal of the implant Latissimus dorsi reconstruction April 2024 Breast expanders August 2024 Implant placement November 2024   Antiestrogen therapy adverse effects: Tolerating it well   Continue monthly Zoladex  injections for ovarian function suppression    She plans to do oophorectomy once she recovers from her breast reconstruction.  She wants to do a hysterectomy with BSO accompanied by tummy tuck operation.   Signatera testing: Negative   Anxiety: add Wellbutrin  150 mg and slowly decrease Effexor  by cutting it down to every other day and hopefully discontinuing by her September appointment.   Return to clinic in September for follow-up.  She will come monthly for Zoladex  injections until she undergoes oophorectomy.

## 2024-06-09 NOTE — Progress Notes (Signed)
 Per MD request, referral faxed to [facility] [phone]) for anxiety medication management.

## 2024-06-10 ENCOUNTER — Other Ambulatory Visit: Payer: Self-pay | Admitting: *Deleted

## 2024-06-10 ENCOUNTER — Encounter: Payer: Self-pay | Admitting: Hematology and Oncology

## 2024-06-10 NOTE — Progress Notes (Signed)
 Received message from [facility] stating they are not currently accepting new patients at this time.  RN reviewed with MD and verbal orders received and placed for pt to be seen at [facility].  RN successfully faxed referral to [phone].

## 2024-06-16 ENCOUNTER — Encounter: Payer: Self-pay | Admitting: *Deleted

## 2024-06-16 NOTE — Progress Notes (Signed)
 Received fax from [facility] stating they have attempted x4 to contact pt to set up appt, no answer, LVM for pt to schedule.  MD notified and verbalized understanding.

## 2024-06-17 ENCOUNTER — Other Ambulatory Visit: Payer: Self-pay | Admitting: Hematology and Oncology

## 2024-06-23 ENCOUNTER — Inpatient Hospital Stay

## 2024-06-23 ENCOUNTER — Inpatient Hospital Stay: Admitting: Hematology and Oncology

## 2024-06-24 ENCOUNTER — Inpatient Hospital Stay

## 2024-06-24 VITALS — BP 106/69 | HR 89 | Temp 98.5°F | Resp 19 | Wt 125.0 lb

## 2024-06-24 DIAGNOSIS — Z5111 Encounter for antineoplastic chemotherapy: Secondary | ICD-10-CM | POA: Diagnosis not present

## 2024-06-24 DIAGNOSIS — Z95828 Presence of other vascular implants and grafts: Secondary | ICD-10-CM

## 2024-06-24 DIAGNOSIS — Z170 Estrogen receptor positive status [ER+]: Secondary | ICD-10-CM

## 2024-06-24 MED ORDER — GOSERELIN ACETATE 3.6 MG ~~LOC~~ IMPL
3.6000 mg | DRUG_IMPLANT | Freq: Once | SUBCUTANEOUS | Status: AC
  Administered 2024-06-24: 3.6 mg via SUBCUTANEOUS
  Filled 2024-06-24: qty 3.6

## 2024-07-07 LAB — SIGNATERA
SIGNATERA MTM READOUT: 0 MTM/ml
SIGNATERA TEST RESULT: NEGATIVE

## 2024-07-22 ENCOUNTER — Encounter: Payer: Self-pay | Admitting: Hematology and Oncology

## 2024-07-23 ENCOUNTER — Inpatient Hospital Stay: Attending: Hematology and Oncology

## 2024-07-23 VITALS — BP 126/55 | HR 72 | Temp 98.9°F | Resp 19

## 2024-07-23 DIAGNOSIS — Z95828 Presence of other vascular implants and grafts: Secondary | ICD-10-CM

## 2024-07-23 DIAGNOSIS — Z5111 Encounter for antineoplastic chemotherapy: Secondary | ICD-10-CM | POA: Insufficient documentation

## 2024-07-23 DIAGNOSIS — C50411 Malignant neoplasm of upper-outer quadrant of right female breast: Secondary | ICD-10-CM | POA: Insufficient documentation

## 2024-07-23 DIAGNOSIS — Z170 Estrogen receptor positive status [ER+]: Secondary | ICD-10-CM | POA: Insufficient documentation

## 2024-07-23 MED ORDER — GOSERELIN ACETATE 3.6 MG ~~LOC~~ IMPL
3.6000 mg | DRUG_IMPLANT | Freq: Once | SUBCUTANEOUS | Status: AC
  Administered 2024-07-23: 3.6 mg via SUBCUTANEOUS
  Filled 2024-07-23: qty 3.6

## 2024-07-24 NOTE — Progress Notes (Signed)
 Surgical Instructions   Your procedure is scheduled on Thursday, October 23rd. Report to [facility] Main Entrance A at 5:30 A.M., then check in with the Admitting office. Any questions or running late day of surgery: call [phone]  Questions prior to your surgery date: call [phone], Monday-Friday, 8am-4pm. If you experience any cold or flu symptoms such as cough, fever, chills, shortness of breath, etc. between now and your scheduled surgery, please notify us  at the above number.     Remember:  Do not eat or drink after midnight the night before your surgery  Take these medicines the morning of surgery with A SIP OF WATER : NONE   May take these medicines IF NEEDED: acetaminophen  (TYLENOL )  albuterol (VENTOLIN HFA) inhaler -  bring with you on day of surgery.  ALPRAZolam  (XANAX )  loratadine  (CLARITIN )  ondansetron  (ZOFRAN )    One week prior to surgery, STOP taking any Aspirin (unless otherwise instructed by your surgeon) Aleve , Naproxen , Ibuprofen , Motrin , Advil , Goody's, BC's, all herbal medications, fish oil, and non-prescription vitamins.   As of today, HOLD your tirzepatide (MOUNJARO) injection until after surgery.                      Do NOT Smoke (Tobacco/Vaping) for 24 hours prior to your procedure.  If you use a CPAP at night, you may bring your mask/headgear for your overnight stay.   You will be asked to remove any contacts, glasses, piercing's, hearing aid's, dentures/partials prior to surgery. Please bring cases for these items if needed.    Patients discharged the day of surgery will not be allowed to drive home, and someone needs to stay with them for 24 hours.  SURGICAL WAITING ROOM VISITATION Patients may have no more than 2 support people in the waiting area - these visitors may rotate.   Pre-op nurse will coordinate an appropriate time for 1 ADULT support person, who may not rotate, to accompany patient in pre-op.  Children under the age of 10  must have an adult with them who is not the patient and must remain in the main waiting area with an adult.  If the patient needs to stay at the hospital during part of their recovery, the visitor guidelines for inpatient rooms apply.  Please refer to the [facility] website for the visitor guidelines for any additional information.   If you received a COVID test during your pre-op visit  it is requested that you wear a mask when out in public, stay away from anyone that may not be feeling well and notify your surgeon if you develop symptoms. If you have been in contact with anyone that has tested positive in the last 10 days please notify you surgeon.      Pre-operative CHG Bathing Instructions   You can play a key role in reducing the risk of infection after surgery. Your skin needs to be as free of germs as possible. You can reduce the number of germs on your skin by washing with CHG (chlorhexidine  gluconate) soap before surgery. CHG is an antiseptic soap that kills germs and continues to kill germs even after washing.   DO NOT use if you have an allergy to chlorhexidine /CHG or antibacterial soaps. If your skin becomes reddened or irritated, stop using the CHG and notify one of our RNs at [phone].              TAKE A SHOWER THE NIGHT BEFORE SURGERY   Please keep in  mind the following:  DO NOT shave, including legs and underarms, 48 hours prior to surgery.   Place clean sheets on your bed the night before surgery Use a clean washcloth (not used since being washed) for shower. DO NOT sleep with pet's night before surgery.  CHG Shower Instructions:  Wash your face and private area with normal soap. If you choose to wash your hair, wash first with your normal shampoo.  After you use shampoo/soap, rinse your hair and body thoroughly to remove shampoo/soap residue.  Turn the water  OFF and apply half the bottle of CHG soap to a CLEAN washcloth.  Apply CHG soap ONLY FROM YOUR NECK DOWN  TO YOUR TOES (washing for 3-5 minutes)  DO NOT use CHG soap on face, private areas, open wounds, or sores.  Pay special attention to the area where your surgery is being performed.  If you are having back surgery, having someone wash your back for you may be helpful. Wait 2 minutes after CHG soap is applied, then you may rinse off the CHG soap.  Pat dry with a clean towel  Put on clean pajamas    Additional instructions for the day of surgery: If you choose, you may shower the morning of surgery with an antibacterial soap.  DO NOT APPLY any lotions, deodorants, powders, or perfumes.   Do not wear jewelry or makeup Do not wear nail polish, gel polish, artificial nails, or any other type of covering on natural nails (fingers and toes) Do not bring valuables to the hospital. [facility] is not responsible for valuables/personal belongings. Put on clean/comfortable clothes.  Please brush your teeth.  Ask your nurse before applying any prescription medications to the skin.

## 2024-07-27 ENCOUNTER — Encounter (HOSPITAL_COMMUNITY): Payer: Self-pay

## 2024-07-27 ENCOUNTER — Encounter (HOSPITAL_COMMUNITY)
Admission: RE | Admit: 2024-07-27 | Discharge: 2024-07-27 | Disposition: A | Discharge status: Home / Self Care | Source: Ambulatory Visit | Attending: Obstetrics and Gynecology | Admitting: Obstetrics and Gynecology

## 2024-07-27 ENCOUNTER — Other Ambulatory Visit: Payer: Self-pay

## 2024-07-27 VITALS — BP 105/61 | HR 86 | Temp 98.3°F | Resp 16 | Ht 64.0 in | Wt 126.6 lb

## 2024-07-27 DIAGNOSIS — Z853 Personal history of malignant neoplasm of breast: Secondary | ICD-10-CM | POA: Insufficient documentation

## 2024-07-27 DIAGNOSIS — Z01818 Encounter for other preprocedural examination: Secondary | ICD-10-CM

## 2024-07-27 DIAGNOSIS — Z01812 Encounter for preprocedural laboratory examination: Secondary | ICD-10-CM | POA: Diagnosis present

## 2024-07-27 LAB — TYPE AND SCREEN
ABO/RH(D): O POS
Antibody Screen: NEGATIVE

## 2024-07-27 LAB — CBC
HCT: 43 % (ref 36.0–46.0)
Hemoglobin: 14.2 g/dL (ref 12.0–15.0)
MCH: 30.7 pg (ref 26.0–34.0)
MCHC: 33 g/dL (ref 30.0–36.0)
MCV: 93.1 fL (ref 80.0–100.0)
Platelets: 250 K/uL (ref 150–400)
RBC: 4.62 MIL/uL (ref 3.87–5.11)
RDW: 12.5 % (ref 11.5–15.5)
WBC: 7.1 K/uL (ref 4.0–10.5)
nRBC: 0 % (ref 0.0–0.2)

## 2024-07-27 LAB — SURGICAL PCR SCREEN
MRSA, PCR: NEGATIVE
Staphylococcus aureus: POSITIVE — AB

## 2024-07-27 NOTE — Progress Notes (Signed)
 PCP - Dr. Rosaline Cobble Cardiologist - denies  PPM/ICD - denies Device Orders - n/a Rep Notified - n/a  Chest x-ray - n/a EKG - n/a Stress Test - denies ECHO - 01-09-23 Cardiac Cath - denies  Sleep Study - denies CPAP - n/a  NON-diabetic  Last dose of GLP1 agonist- tirzepatide (MOUNJARO)   GLP1 instructions: Hold until after surgery. Per patient last dose was taken on Sunday, October 19th  Blood Thinner Instructions: Denies Aspirin Instructions:Denies  ERAS Protcol - npo PRE-SURGERY Ensure or G2- none  COVID TEST- n/a   Anesthesia review: no  Patient denies shortness of breath, fever, cough and chest pain at PAT appointment   All instructions explained to the patient, with a verbal understanding of the material. Patient agrees to go over the instructions while at home for a better understanding. Patient also instructed to self quarantine after being tested for COVID-19. The opportunity to ask questions was provided.

## 2024-07-29 NOTE — Anesthesia Preprocedure Evaluation (Signed)
 Anesthesia Evaluation  Patient identified by MRN, date of birth, ID band Patient awake    Reviewed: Allergy & Precautions, H&P , NPO status , Patient's Chart, lab work & pertinent test results  Airway Mallampati: II  TM Distance: >3 FB Neck ROM: Full    Dental no notable dental hx. (+) Teeth Intact, Dental Advisory Given   Pulmonary neg pulmonary ROS   Pulmonary exam normal breath sounds clear to auscultation       Cardiovascular Exercise Tolerance: Good negative cardio ROS  Rhythm:Regular Rate:Normal     Neuro/Psych   Anxiety     negative neurological ROS  negative psych ROS   GI/Hepatic negative GI ROS, Neg liver ROS,,,  Endo/Other  negative endocrine ROS    Renal/GU negative Renal ROS  negative genitourinary   Musculoskeletal   Abdominal   Peds  Hematology  (+) Blood dyscrasia, anemia   Anesthesia Other Findings   Reproductive/Obstetrics negative OB ROS                              Anesthesia Physical Anesthesia Plan  ASA: 2  Anesthesia Plan: General   Post-op Pain Management: Tylenol  PO (pre-op)* and Regional block*   Induction: Intravenous  PONV Risk Score and Plan: 4 or greater and Ondansetron , Dexamethasone  and Midazolam   Airway Management Planned: Oral ETT  Additional Equipment:   Intra-op Plan:   Post-operative Plan: Extubation in OR  Informed Consent: I have reviewed the patients History and Physical, chart, labs and discussed the procedure including the risks, benefits and alternatives for the proposed anesthesia with the patient or authorized representative who has indicated his/her understanding and acceptance.     Dental advisory given  Plan Discussed with: CRNA  Anesthesia Plan Comments:          Anesthesia Quick Evaluation

## 2024-07-30 ENCOUNTER — Encounter (HOSPITAL_COMMUNITY)
Admission: RE | Disposition: A | Discharge status: Home / Self Care | Payer: Self-pay | Source: Home / Self Care | Attending: Obstetrics and Gynecology

## 2024-07-30 ENCOUNTER — Other Ambulatory Visit: Payer: Self-pay

## 2024-07-30 ENCOUNTER — Ambulatory Visit (HOSPITAL_COMMUNITY): Admitting: Anesthesiology

## 2024-07-30 ENCOUNTER — Encounter (HOSPITAL_COMMUNITY): Payer: Self-pay | Admitting: Obstetrics and Gynecology

## 2024-07-30 ENCOUNTER — Ambulatory Visit (HOSPITAL_COMMUNITY)
Admission: RE | Admit: 2024-07-30 | Discharge: 2024-07-31 | Disposition: A | Discharge status: Home / Self Care | Attending: Obstetrics and Gynecology | Admitting: Obstetrics and Gynecology

## 2024-07-30 ENCOUNTER — Ambulatory Visit (HOSPITAL_BASED_OUTPATIENT_CLINIC_OR_DEPARTMENT_OTHER): Admitting: Anesthesiology

## 2024-07-30 DIAGNOSIS — Z90722 Acquired absence of ovaries, bilateral: Secondary | ICD-10-CM

## 2024-07-30 DIAGNOSIS — Z1502 Genetic susceptibility to malignant neoplasm of ovary: Secondary | ICD-10-CM | POA: Insufficient documentation

## 2024-07-30 DIAGNOSIS — Z4002 Encounter for prophylactic removal of ovary: Secondary | ICD-10-CM

## 2024-07-30 DIAGNOSIS — N72 Inflammatory disease of cervix uteri: Secondary | ICD-10-CM | POA: Diagnosis not present

## 2024-07-30 DIAGNOSIS — Z90710 Acquired absence of both cervix and uterus: Secondary | ICD-10-CM

## 2024-07-30 DIAGNOSIS — Z853 Personal history of malignant neoplasm of breast: Secondary | ICD-10-CM | POA: Insufficient documentation

## 2024-07-30 DIAGNOSIS — N736 Female pelvic peritoneal adhesions (postinfective): Secondary | ICD-10-CM | POA: Insufficient documentation

## 2024-07-30 HISTORY — PX: HYSTERECTOMY ABDOMINAL WITH SALPINGO-OOPHORECTOMY: SHX6792

## 2024-07-30 LAB — POCT PREGNANCY, URINE: Preg Test, Ur: NEGATIVE

## 2024-07-30 SURGERY — HYSTERECTOMY, ABDOMINAL, WITH SALPINGO-OOPHORECTOMY
Anesthesia: General

## 2024-07-30 MED ORDER — SODIUM CHLORIDE 0.9% FLUSH: 9.0000 mL | INTRAVENOUS | Status: DC | PRN

## 2024-07-30 MED ORDER — HYDROMORPHONE HCL 1 MG/ML IJ SOLN
INTRAMUSCULAR | Status: DC | PRN
  Administered 2024-07-30: .5 mg via INTRAVENOUS

## 2024-07-30 MED ORDER — LETROZOLE 2.5 MG PO TABS
2.5000 mg | ORAL_TABLET | Freq: Every day | ORAL | Status: DC
  Administered 2024-07-30: 2.5 mg via ORAL
  Filled 2024-07-30: qty 1

## 2024-07-30 MED ORDER — OXYCODONE HCL 5 MG PO TABS
5.0000 mg | ORAL_TABLET | ORAL | Status: DC | PRN
  Administered 2024-07-30: 10 mg via ORAL
  Administered 2024-07-31: 5 mg via ORAL
  Filled 2024-07-30: qty 2
  Filled 2024-07-30: qty 1

## 2024-07-30 MED ORDER — DEXAMETHASONE SOD PHOSPHATE PF 10 MG/ML IJ SOLN
INTRAMUSCULAR | Status: DC | PRN
  Administered 2024-07-30: 8 mg via INTRAVENOUS

## 2024-07-30 MED ORDER — LACTATED RINGERS IV BOLUS
500.0000 mL | Freq: Once | INTRAVENOUS | Status: AC
  Administered 2024-07-30: 500 mL via INTRAVENOUS

## 2024-07-30 MED ORDER — 0.9 % SODIUM CHLORIDE (POUR BTL) OPTIME
TOPICAL | Status: DC | PRN
  Administered 2024-07-30 (×2): 1000 mL

## 2024-07-30 MED ORDER — ROCURONIUM BROMIDE 10 MG/ML (PF) SYRINGE
PREFILLED_SYRINGE | INTRAVENOUS | Status: DC | PRN
  Administered 2024-07-30: 50 mg via INTRAVENOUS
  Administered 2024-07-30: 10 mg via INTRAVENOUS

## 2024-07-30 MED ORDER — PROPOFOL 10 MG/ML IV BOLUS
INTRAVENOUS | Status: DC | PRN
Start: 2024-07-30 — End: 2024-07-30
  Administered 2024-07-30: 200 ug/kg/min via INTRAVENOUS
  Administered 2024-07-30: 120 mg via INTRAVENOUS

## 2024-07-30 MED ORDER — ONDANSETRON HCL 4 MG/2ML IJ SOLN
4.0000 mg | Freq: Four times a day (QID) | INTRAMUSCULAR | Status: DC | PRN
  Administered 2024-07-30: 4 mg via INTRAVENOUS
  Filled 2024-07-30: qty 2

## 2024-07-30 MED ORDER — DIPHENHYDRAMINE HCL 50 MG/ML IJ SOLN: 12.5000 mg | Freq: Four times a day (QID) | INTRAMUSCULAR | Status: DC | PRN

## 2024-07-30 MED ORDER — MENTHOL 3 MG MT LOZG: 1.0000 | LOZENGE | OROMUCOSAL | Status: DC | PRN

## 2024-07-30 MED ORDER — HYDROMORPHONE HCL 1 MG/ML IJ SOLN
INTRAMUSCULAR | Status: AC
  Filled 2024-07-30: qty 1

## 2024-07-30 MED ORDER — HYDROMORPHONE 1 MG/ML IV SOLN
INTRAVENOUS | Status: DC
  Administered 2024-07-30: 30 mg via INTRAVENOUS
  Administered 2024-07-30: 2.3 mg via INTRAVENOUS
  Filled 2024-07-30: qty 30

## 2024-07-30 MED ORDER — ROCURONIUM BROMIDE 10 MG/ML (PF) SYRINGE
PREFILLED_SYRINGE | INTRAVENOUS | Status: AC
  Filled 2024-07-30: qty 10

## 2024-07-30 MED ORDER — LACTATED RINGERS IV SOLN
INTRAVENOUS | Status: DC
Start: 2024-07-30 — End: 2024-07-30

## 2024-07-30 MED ORDER — HYDROMORPHONE HCL 1 MG/ML IJ SOLN
INTRAMUSCULAR | Status: AC
  Filled 2024-07-30: qty 0.5

## 2024-07-30 MED ORDER — HYDROMORPHONE HCL 1 MG/ML IJ SOLN
0.2500 mg | INTRAMUSCULAR | Status: DC | PRN
  Administered 2024-07-30: 0.25 mg via INTRAVENOUS
  Administered 2024-07-30: 0.5 mg via INTRAVENOUS

## 2024-07-30 MED ORDER — ONDANSETRON HCL 4 MG/2ML IJ SOLN: 4.0000 mg | Freq: Four times a day (QID) | INTRAMUSCULAR | Status: DC | PRN

## 2024-07-30 MED ORDER — VALACYCLOVIR HCL 500 MG PO TABS
500.0000 mg | ORAL_TABLET | Freq: Every day | ORAL | Status: DC
  Administered 2024-07-30: 500 mg via ORAL
  Filled 2024-07-30: qty 1

## 2024-07-30 MED ORDER — FENTANYL CITRATE (PF) 250 MCG/5ML IJ SOLN
INTRAMUSCULAR | Status: DC | PRN
  Administered 2024-07-30 (×2): 50 ug via INTRAVENOUS

## 2024-07-30 MED ORDER — METOCLOPRAMIDE HCL 5 MG/ML IJ SOLN: 10.0000 mg | Freq: Four times a day (QID) | INTRAMUSCULAR | Status: DC

## 2024-07-30 MED ORDER — LACTATED RINGERS IV SOLN: INTRAVENOUS | Status: DC

## 2024-07-30 MED ORDER — HYDROMORPHONE HCL 1 MG/ML IJ SOLN
0.2000 mg | INTRAMUSCULAR | Status: DC | PRN
  Administered 2024-07-30: 0.4 mg via INTRAVENOUS
  Filled 2024-07-30: qty 1

## 2024-07-30 MED ORDER — POLYETHYLENE GLYCOL 3350 17 G PO PACK
17.0000 g | PACK | Freq: Every day | ORAL | Status: DC
  Administered 2024-07-30: 17 g via ORAL
  Filled 2024-07-30: qty 1

## 2024-07-30 MED ORDER — KETOROLAC TROMETHAMINE 30 MG/ML IJ SOLN
INTRAMUSCULAR | Status: DC | PRN
  Administered 2024-07-30: 15 mg via INTRAVENOUS

## 2024-07-30 MED ORDER — SODIUM CHLORIDE 0.9 % IV SOLN
2.0000 g | INTRAVENOUS | Status: AC
  Administered 2024-07-30: 2 g via INTRAVENOUS
  Filled 2024-07-30: qty 2

## 2024-07-30 MED ORDER — PROPOFOL 1000 MG/100ML IV EMUL
INTRAVENOUS | Status: AC
  Filled 2024-07-30: qty 100

## 2024-07-30 MED ORDER — LIDOCAINE 2% (20 MG/ML) 5 ML SYRINGE
INTRAMUSCULAR | Status: AC
  Filled 2024-07-30: qty 5

## 2024-07-30 MED ORDER — NALOXONE HCL 0.4 MG/ML IJ SOLN: 0.4000 mg | INTRAMUSCULAR | Status: DC | PRN

## 2024-07-30 MED ORDER — BUPIVACAINE LIPOSOME 1.3 % IJ SUSP
INTRAMUSCULAR | Status: DC | PRN
  Administered 2024-07-30 (×2): 5 mL

## 2024-07-30 MED ORDER — ORAL CARE MOUTH RINSE: 15.0000 mL | Freq: Once | OROMUCOSAL | Status: AC

## 2024-07-30 MED ORDER — ONDANSETRON HCL 4 MG/2ML IJ SOLN
INTRAMUSCULAR | Status: DC | PRN
  Administered 2024-07-30: 4 mg via INTRAVENOUS

## 2024-07-30 MED ORDER — MIDAZOLAM HCL (PF) 2 MG/2ML IJ SOLN
INTRAMUSCULAR | Status: DC | PRN
  Administered 2024-07-30: 2 mg via INTRAVENOUS

## 2024-07-30 MED ORDER — PROPOFOL 10 MG/ML IV BOLUS
INTRAVENOUS | Status: AC
  Filled 2024-07-30: qty 20

## 2024-07-30 MED ORDER — ONDANSETRON HCL 4 MG PO TABS: 4.0000 mg | ORAL_TABLET | Freq: Four times a day (QID) | ORAL | Status: DC | PRN

## 2024-07-30 MED ORDER — SCOPOLAMINE 1 MG/3DAYS TD PT72
1.0000 | MEDICATED_PATCH | TRANSDERMAL | Status: DC
  Administered 2024-07-30: 1 mg via TRANSDERMAL
  Filled 2024-07-30: qty 1

## 2024-07-30 MED ORDER — MIDAZOLAM HCL 2 MG/2ML IJ SOLN
INTRAMUSCULAR | Status: AC
  Filled 2024-07-30: qty 2

## 2024-07-30 MED ORDER — POVIDONE-IODINE 10 % EX SWAB
2.0000 | Freq: Once | CUTANEOUS | Status: AC
  Administered 2024-07-30: 2 via TOPICAL

## 2024-07-30 MED ORDER — SODIUM CHLORIDE 0.9 % IV SOLN
12.5000 mg | Freq: Once | INTRAVENOUS | Status: DC
  Filled 2024-07-30: qty 0.5

## 2024-07-30 MED ORDER — ONDANSETRON HCL 4 MG/2ML IJ SOLN
INTRAMUSCULAR | Status: AC
  Filled 2024-07-30: qty 2

## 2024-07-30 MED ORDER — HYDROMORPHONE HCL 2 MG PO TABS: 2.0000 mg | ORAL_TABLET | ORAL | Status: DC | PRN

## 2024-07-30 MED ORDER — METOCLOPRAMIDE HCL 5 MG/ML IJ SOLN
10.0000 mg | Freq: Four times a day (QID) | INTRAMUSCULAR | Status: DC | PRN
  Administered 2024-07-30: 10 mg via INTRAVENOUS
  Filled 2024-07-30: qty 2

## 2024-07-30 MED ORDER — ACETAMINOPHEN 325 MG PO TABS
650.0000 mg | ORAL_TABLET | ORAL | Status: DC | PRN
  Administered 2024-07-30: 650 mg via ORAL
  Filled 2024-07-30: qty 2

## 2024-07-30 MED ORDER — CHLORHEXIDINE GLUCONATE 0.12 % MT SOLN
15.0000 mL | Freq: Once | OROMUCOSAL | Status: AC
  Administered 2024-07-30: 15 mL via OROMUCOSAL
  Filled 2024-07-30: qty 15

## 2024-07-30 MED ORDER — BUPIVACAINE-EPINEPHRINE (PF) 0.5% -1:200000 IJ SOLN
INTRAMUSCULAR | Status: DC | PRN
  Administered 2024-07-30 (×2): 15 mL

## 2024-07-30 MED ORDER — DIPHENHYDRAMINE HCL 12.5 MG/5ML PO ELIX: 12.5000 mg | ORAL_SOLUTION | Freq: Four times a day (QID) | ORAL | Status: DC | PRN

## 2024-07-30 MED ORDER — ACETAMINOPHEN 500 MG PO TABS
1000.0000 mg | ORAL_TABLET | Freq: Once | ORAL | Status: AC
  Administered 2024-07-30: 1000 mg via ORAL
  Filled 2024-07-30: qty 2

## 2024-07-30 MED ORDER — VENLAFAXINE HCL ER 37.5 MG PO CP24
37.5000 mg | ORAL_CAPSULE | Freq: Every day | ORAL | Status: DC
  Administered 2024-07-30: 37.5 mg via ORAL
  Filled 2024-07-30: qty 1

## 2024-07-30 MED ORDER — IBUPROFEN 600 MG PO TABS
600.0000 mg | ORAL_TABLET | Freq: Four times a day (QID) | ORAL | Status: DC
  Administered 2024-07-30 – 2024-07-31 (×4): 600 mg via ORAL
  Filled 2024-07-30 (×4): qty 1

## 2024-07-30 MED ORDER — FENTANYL CITRATE (PF) 100 MCG/2ML IJ SOLN
INTRAMUSCULAR | Status: AC
  Filled 2024-07-30: qty 2

## 2024-07-30 SURGICAL SUPPLY — 29 items
BENZOIN TINCTURE PRP APPL 2/3 (GAUZE/BANDAGES/DRESSINGS) ×2 IMPLANT
COVER MAYO STAND STRL (DRAPES) ×2 IMPLANT
DRAPE CESAREAN BIRTH W POUCH (DRAPES) ×2 IMPLANT
DRAPE WARM FLUID 44X44 (DRAPES) ×2 IMPLANT
DRSG OPSITE POSTOP 4X10 (GAUZE/BANDAGES/DRESSINGS) ×2 IMPLANT
DRSG OPSITE POSTOP 4X12 (GAUZE/BANDAGES/DRESSINGS) IMPLANT
DURAPREP 26ML APPLICATOR (WOUND CARE) ×2 IMPLANT
GAUZE 4X4 16PLY ~~LOC~~+RFID DBL (SPONGE) ×2 IMPLANT
GLOVE BIO SURGEON STRL SZ 6.5 (GLOVE) ×2 IMPLANT
GLOVE SURG UNDER POLY LF SZ7 (GLOVE) ×4 IMPLANT
GOWN STRL REUS W/ TWL LRG LVL3 (GOWN DISPOSABLE) ×2 IMPLANT
HIBICLENS CHG 4% 4OZ BTL (MISCELLANEOUS) ×2 IMPLANT
KIT TURNOVER KIT B (KITS) ×2 IMPLANT
NDL HYPO 22X1.5 SAFETY MO (MISCELLANEOUS) ×2 IMPLANT
NEEDLE HYPO 22X1.5 SAFETY MO (MISCELLANEOUS) IMPLANT
PACK ABDOMINAL GYN (CUSTOM PROCEDURE TRAY) ×2 IMPLANT
PAD OB MATERNITY 11 LF (PERSONAL CARE ITEMS) ×2 IMPLANT
POWDER SURGICEL 3.0 GRAM (HEMOSTASIS) IMPLANT
SOLN 0.9% NACL POUR BTL 1000ML (IV SOLUTION) ×2 IMPLANT
SPONGE T-LAP 18X18 ~~LOC~~+RFID (SPONGE) IMPLANT
STRIP CLOSURE SKIN 1/2X4 (GAUZE/BANDAGES/DRESSINGS) ×2 IMPLANT
SUT PDS AB 0 CT 36 (SUTURE) IMPLANT
SUT PDS AB 0 CTX 60 (SUTURE) IMPLANT
SUT VIC AB 0 CT1 18XCR BRD8 (SUTURE) ×4 IMPLANT
SUT VIC AB 0 CT1 27XBRD ANBCTR (SUTURE) ×8 IMPLANT
SUT VIC AB 4-0 KS 27 (SUTURE) ×2 IMPLANT
SUT VICRYL 0 TIES 12 18 (SUTURE) ×2 IMPLANT
TOWEL GREEN STERILE FF (TOWEL DISPOSABLE) ×2 IMPLANT
TRAY FOLEY W/BAG SLVR 14FR (SET/KITS/TRAYS/PACK) ×2 IMPLANT

## 2024-07-30 NOTE — Anesthesia Procedure Notes (Signed)
 Procedure Name: Intubation Date/Time: 07/30/2024 7:44 AM  Performed by: Delores Duwaine SAUNDERS, CRNAPre-anesthesia Checklist: Patient identified, Emergency Drugs available, Suction available and Patient being monitored Patient Re-evaluated:Patient Re-evaluated prior to induction Oxygen Delivery Method: Circle System Utilized Preoxygenation: Pre-oxygenation with 100% oxygen Induction Type: IV induction Ventilation: Mask ventilation without difficulty Grade View: Grade II Tube type: Oral Tube size: 7.0 mm Number of attempts: 1 Airway Equipment and Method: Stylet and Oral airway Placement Confirmation: ETT inserted through vocal cords under direct vision, positive ETCO2 and breath sounds checked- equal and bilateral Secured at: 21 cm Tube secured with: Tape Dental Injury: Teeth and Oropharynx as per pre-operative assessment

## 2024-07-30 NOTE — Plan of Care (Signed)

## 2024-07-30 NOTE — Brief Op Note (Signed)
 07/30/2024  8:48 AM  PATIENT:  Shelba VEAR [place]  38 y.o. female  PRE-OPERATIVE DIAGNOSIS:   History of Breast Cancer   POST-OPERATIVE DIAGNOSIS:   Same  PROCEDURE:  Procedure(s): HYSTERECTOMY, ABDOMINAL, WITH BILATERAL SALPINGO-OOPHORECTOMY (N/A)  SURGEON:  Surgeons and Role:    * Mat Browning, MD - Primary    * Laurence Slater PARAS, MD - Assisting  PHYSICIAN ASSISTANT:   ASSISTANTS: none   ANESTHESIA:   Tap Block and General   EBL:  30 mL   BLOOD ADMINISTERED:none  DRAINS: Urinary Catheter (Foley)   LOCAL MEDICATIONS USED:  NONE  SPECIMEN:  Source of Specimen:  cervix and tubes and uterus and ovaries   DISPOSITION OF SPECIMEN:  PATHOLOGY  COUNTS:  correct  TOURNIQUET:  * No tourniquets in log *  DICTATION: .Other Dictation: Dictation Number dictated  PLAN OF CARE: Admit for overnight observation  PATIENT DISPOSITION:  PACU - hemodynamically stable.   Delay start of Pharmacological VTE agent (>24hrs) due to surgical blood loss or risk of bleeding: not applicable

## 2024-07-30 NOTE — Anesthesia Postprocedure Evaluation (Signed)
 Anesthesia Post Note  Patient: Jenna Cross  Procedure(s) Performed: HYSTERECTOMY, ABDOMINAL, WITH BILATERAL SALPINGO-OOPHORECTOMY     Patient location during evaluation: PACU Anesthesia Type: General and Regional Level of consciousness: awake and alert Pain management: pain level controlled Vital Signs Assessment: post-procedure vital signs reviewed and stable Respiratory status: spontaneous breathing, nonlabored ventilation and respiratory function stable Cardiovascular status: blood pressure returned to baseline and stable Postop Assessment: no apparent nausea or vomiting Anesthetic complications: no   No notable events documented.  Last Vitals:  Vitals:   07/30/24 0930 07/30/24 0944  BP: (!) 102/57 (!) 107/50  Pulse: 76 70  Resp: 16 16  Temp: 36.7 C 36.6 C  SpO2: 95%     Last Pain:  Vitals:   07/30/24 0949  TempSrc:   PainSc: 10-Worst pain ever                 [name],W. EDMOND

## 2024-07-30 NOTE — Op Note (Signed)
 Jenna Cross, FEHRMAN MEDICAL RECORD NO: [id number] ACCOUNT NO: [id number] DATE OF BIRTH: 1986-01-11 FACILITY: [facility] LOCATION: [facility] PHYSICIAN: Jarmar Rousseau L. Mat, MD  Operative Report   DATE OF PROCEDURE: 07/30/2024  PREOPERATIVE DIAGNOSIS: History of breast cancer.  POSTOPERATIVE DIAGNOSIS: History of breast cancer.  PROCEDURE: Total abdominal hysterectomy with bilateral salpingo-oophorectomy.  SURGEON: Anjolie Majer L. Pasqual Farias, MD  ANESTHESIA: TAP block and general.  ESTIMATED BLOOD LOSS: 30 mL.  COMPLICATIONS: None.  DRAINS: Foley catheter.  DESCRIPTION OF PROCEDURE: The patient was taken to the operating room after her TAP block was performed by Dr. Epifanio. She was intubated. She was prepped and draped. A Foley catheter was inserted. Timeout was performed. A low transverse incision was  made in the area of the previous cesarean section scar and carried down to the fascia. The fascia was scored in the midline and extended laterally. The rectus muscles were divided in the midline. The peritoneum was entered bluntly. The self-retaining  retractor was placed in the abdominal cavity and the large and small bowel were carefully placed in the upper abdomen. The uterus was small and normal. Ovaries were unremarkable. There were some adhesions probably from her previous C-section to the lower  uterine segment. The uterus was grasped on either side of the triple pedicle using Kelly clamps and the round ligaments were then suture ligated using a Vicryl suture. The anterior and posterior leaves of the broad ligaments were then incised and  developed on both sides with the Bovie and on the right side an avascular window was created beneath the right infundibulopelvic ligament. A curved Haney clamp was placed across this and the pedicle was secured with a suture ligature and a 5-0 Vicryl  suture. This was done on the left side in a similar fashion. We then skeletonized the uterine artery  and then released the bladder from the anterior surface of the uterus and the cervix with Metzenbaum scissors and a curved Haney clamp was used to clamp  the uterine artery on either side at the level of the internal os. The pedicle was secured using #0 Vicryl suture. We then elevated the uterus further and then placed a series of straight Haney clamps just snug beside the cervix incorporating the  uterosacral and cardinal ligaments. Each pedicle was clamped and suture ligated using #0 Vicryl suture. The cervix was very short, so we then were able to then place curved Haney clamps just beneath the external os and remove the cervix, uterus, tubes,  and ovaries. This was sent to pathology. The angle stitches were placed using 0 Vicryl suture. The remainder of the cuff, which was very small, was closed in two sutures using 0 Vicryl suture. Hemostasis was noted. Irrigation was performed. All pedicles  were inspected. Hemostasis was again noted. All instruments and laparotomy pads were removed. The peritoneum was closed using #0 Vicryl in a running stitch. The fascia was closed using 0 Vicryl in a running stitch x 2 starting at each corner and meeting  in the midline. The skin was closed with 3-0 Vicryl on a Keith needle.  All sponge, lap, and instrument counts were correct x 2.  The patient went to the recovery room in stable condition.    MUK D: 07/30/2024 8:54:48 am T: 07/30/2024 9:06:00 am  JOB: [id number]/ [id number]

## 2024-07-30 NOTE — Transfer of Care (Signed)
 Immediate Anesthesia Transfer of Care Note  Patient: Jenna Cross  Procedure(s) Performed: HYSTERECTOMY, ABDOMINAL, WITH BILATERAL SALPINGO-OOPHORECTOMY  Patient Location: PACU  Anesthesia Type:General  Level of Consciousness: drowsy  Airway & Oxygen Therapy: Patient Spontanous Breathing  Post-op Assessment: Report given to RN  Post vital signs: Reviewed and stable  Last Vitals:  Vitals Value Taken Time  BP 108/45 07/30/24 08:55  Temp 36.7 C 07/30/24 08:55  Pulse 89 07/30/24 08:59  Resp 15 07/30/24 08:59  SpO2 95 % 07/30/24 08:59  Vitals shown include unfiled device data.  Last Pain:  Vitals:   07/30/24 0706  TempSrc:   PainSc: 0-No pain         Complications: No notable events documented.

## 2024-07-30 NOTE — H&P (Signed)
 38 year old G 2 P 2 presents for TAH/BSO. History of Breast Cancer and desires removal of ovaries.  Past Medical History:  Diagnosis Date   Anemia    pregnancy related   Anxiety    Anxiety    no medication   Cancer (HCC)    breast   COVID 09/2020   mild case   Family history of prostate cancer 07/17/2021   History of DVT (deep vein thrombosis)    in her neck   History of radiation therapy    Right breast 02/22/22-04/13/22- Dr. Lynwood Nasuti   Past Surgical History:  Procedure Laterality Date   BREAST IMPLANT EXCHANGE Right 01/25/2022   Procedure: BREAST IMPLANT REMOVAL;  Surgeon: Lowery Estefana RAMAN, DO;  Location: [facility] OR;  Service: Plastics;  Laterality: Right;  1 hour   BREAST RECONSTRUCTION WITH PLACEMENT OF TISSUE EXPANDER AND FLEX HD (ACELLULAR HYDRATED DERMIS) Bilateral 12/06/2021   Procedure: BREAST RECONSTRUCTION WITH PLACEMENT OF TISSUE EXPANDER AND FLEX HD (ACELLULAR HYDRATED DERMIS);  Surgeon: Lowery Estefana RAMAN, DO;  Location: [facility];  Service: Plastics;  Laterality: Bilateral;   BREAST SURGERY     cyst removal from right breast    CESAREAN SECTION N/A 01/31/2021   Procedure: CESAREAN SECTION;  Surgeon: Mat Browning, MD;  Location: [facility] ORS;  Service: Obstetrics;  Laterality: N/A;   CHG RADIOLOGICAL GUIDANCE PRQ DRG W/PLMT CATH RS&I  01/23/2022   excision of fibroadenoma of breast     PORT-A-CATH REMOVAL Left 12/03/2022   Procedure: REMOVAL PORT-A-CATH;  Surgeon: Ebbie Cough, MD;  Location: [facility];  Service: General;  Laterality: Left;   PORTACATH PLACEMENT N/A 07/11/2021   Procedure: INSERTION PORT-A-CATH;  Surgeon: Ebbie Cough, MD;  Location: [facility];  Service: General;  Laterality: N/A;   RADIOACTIVE SEED GUIDED AXILLARY SENTINEL LYMPH NODE Right 12/06/2021   Procedure: RADIOACTIVE SEED GUIDED RIGHT AXILLARY SENTINEL LYMPH NODE EXCISION;  Surgeon: Ebbie Cough, MD;  Location: [facility];  Service: General;  Laterality: Right;   REMOVAL OF BILATERAL TISSUE EXPANDERS WITH PLACEMENT OF BILATERAL BREAST IMPLANTS Bilateral 01/01/2022   Procedure: REMOVAL OF BILATERAL TISSUE EXPANDERS WITH PLACEMENT OF BILATERAL BREAST IMPLANTS;  Surgeon: Lowery Estefana RAMAN, DO;  Location: [facility];  Service: Plastics;  Laterality: Bilateral;   SENTINEL NODE BIOPSY Right 12/06/2021   Procedure: RIGHT AXILLARY SENTINEL NODE BIOPSY;  Surgeon: Ebbie Cough, MD;  Location: [facility];  Service: General;  Laterality: Right;   TONSILLECTOMY     and adenoids   TONSILLECTOMY AND ADENOIDECTOMY     TOTAL MASTECTOMY Bilateral 12/06/2021   Procedure: BILATERAL SKIN SPARING TOTAL MASTECTOMY;  Surgeon: Ebbie Cough, MD;  Location: [facility];  Service: General;  Laterality: Bilateral;   WISDOM TOOTH EXTRACTION     Scheduled Meds:  acetaminophen   1,000 mg Oral Once   chlorhexidine   15 mL Mouth/Throat Once   Or   mouth rinse  15 mL Mouth Rinse Once   povidone-iodine  2 Application Topical Once   Continuous Infusions:  cefoTEtan (CEFOTAN) IV     lactated ringers      lactated ringers      PRN Meds:. Wound dressings  BP 101/63   Pulse 83   Temp 97.6 F (36.4 C) (Oral)   Resp 18   Ht 5' 4 (1.626 m)   Wt 59 kg   SpO2 95%   BMI 22.31 kg/m  No results found for this or any previous visit (from  the past 24 hours). General alert and oriented Lung CTAB  Car RRR  Abdomen is soft and non tender  Pelvic WNL  IMPRESSION: History of Breast Cancer  PLAN: TAH/BSO Risks reviewed  Consent signed

## 2024-07-30 NOTE — Anesthesia Procedure Notes (Signed)
 Anesthesia Regional Block: TAP block   Pre-Anesthetic Checklist: , timeout performed,  Correct Patient, Correct Site, Correct Laterality,  Correct Procedure, Correct Position, site marked,  Risks and benefits discussed,  Pre-op evaluation,  At surgeon's request and post-op pain management  Laterality: Left and Right  Prep: Maximum Sterile Barrier Precautions used, chloraprep       Needles:  Injection technique: Single-shot  Needle Type: Echogenic Stimulator Needle     Needle Length: 9cm  Needle Gauge: 21     Additional Needles:   Procedures:,,,, ultrasound used (permanent image in chart),,    Narrative:  Start time: 07/30/2024 7:15 AM End time: 07/30/2024 7:30 AM Injection made incrementally with aspirations every 5 mL.  Performed by: Personally  Anesthesiologist: Epifanio Fallow, MD  Additional Notes: Bilateral TAP blocks

## 2024-07-31 ENCOUNTER — Encounter (HOSPITAL_COMMUNITY): Payer: Self-pay | Admitting: Obstetrics and Gynecology

## 2024-07-31 DIAGNOSIS — Z4002 Encounter for prophylactic removal of ovary: Secondary | ICD-10-CM | POA: Diagnosis not present

## 2024-07-31 LAB — CBC
HCT: 33.4 % — ABNORMAL LOW (ref 36.0–46.0)
Hemoglobin: 11.4 g/dL — ABNORMAL LOW (ref 12.0–15.0)
MCH: 31 pg (ref 26.0–34.0)
MCHC: 34.1 g/dL (ref 30.0–36.0)
MCV: 90.8 fL (ref 80.0–100.0)
Platelets: 204 K/uL (ref 150–400)
RBC: 3.68 MIL/uL — ABNORMAL LOW (ref 3.87–5.11)
RDW: 12.2 % (ref 11.5–15.5)
WBC: 9.6 K/uL (ref 4.0–10.5)
nRBC: 0 % (ref 0.0–0.2)

## 2024-07-31 LAB — SURGICAL PATHOLOGY

## 2024-07-31 MED ORDER — OXYCODONE HCL 5 MG PO TABS: 5.0000 mg | ORAL_TABLET | ORAL | 0 refills | Status: AC | PRN

## 2024-07-31 MED ORDER — IBUPROFEN 600 MG PO TABS: 600.0000 mg | ORAL_TABLET | Freq: Four times a day (QID) | ORAL | 0 refills | Status: AC

## 2024-07-31 NOTE — Plan of Care (Signed)
  Problem: Education: Goal: Knowledge of General Education information will improve Description: Including pain rating scale, medication(s)/side effects and non-pharmacologic comfort measures Outcome: Adequate for Discharge   Problem: Health Behavior/Discharge Planning: Goal: Ability to manage health-related needs will improve Outcome: Adequate for Discharge   Problem: Clinical Measurements: Goal: Ability to maintain clinical measurements within normal limits will improve Outcome: Adequate for Discharge Goal: Will remain free from infection Outcome: Adequate for Discharge Goal: Diagnostic test results will improve Outcome: Adequate for Discharge Goal: Respiratory complications will improve Outcome: Adequate for Discharge Goal: Cardiovascular complication will be avoided Outcome: Adequate for Discharge   Problem: Activity: Goal: Risk for activity intolerance will decrease Outcome: Adequate for Discharge   Problem: Nutrition: Goal: Adequate nutrition will be maintained Outcome: Adequate for Discharge   Problem: Coping: Goal: Level of anxiety will decrease Outcome: Adequate for Discharge   Problem: Elimination: Goal: Will not experience complications related to bowel motility Outcome: Adequate for Discharge Goal: Will not experience complications related to urinary retention Outcome: Adequate for Discharge   Problem: Pain Managment: Goal: General experience of comfort will improve and/or be controlled Outcome: Adequate for Discharge   Problem: Safety: Goal: Ability to remain free from injury will improve Outcome: Adequate for Discharge   Problem: Skin Integrity: Goal: Risk for impaired skin integrity will decrease Outcome: Adequate for Discharge   Problem: Education: Goal: Knowledge of the prescribed therapeutic regimen will improve Outcome: Adequate for Discharge Goal: Understanding of sexual limitations or changes related to disease process or condition will  improve Outcome: Adequate for Discharge Goal: Individualized Educational Video(s) Outcome: Adequate for Discharge   Problem: Self-Concept: Goal: Communication of feelings regarding changes in body function or appearance will improve Outcome: Adequate for Discharge   Problem: Skin Integrity: Goal: Demonstration of wound healing without infection will improve Outcome: Adequate for Discharge

## 2024-07-31 NOTE — Progress Notes (Signed)
   07/31/24 9090  Departure Condition  Departure Condition Good  Mobility at [employer]  Patient/Caregiver Teaching Teach Back Method Used;Discharge instructions reviewed;Prescriptions reviewed;Follow-up care reviewed;Admission discussed;Pain management discussed;Medications discussed;Patient/caregiver verbalized understanding  Departure Mode With significant other   Pt alert and oriented x4, vital signs and pain stable. Discharge instructions and medications reviewed, pt verbalized understanding.

## 2024-07-31 NOTE — Discharge Summary (Signed)
 Date of Admission: 07/30/2024  Date of Discharge: 07/31/2024  Admission Diagnosis: History of Breast Cancer  Discharge Diagnosis: Same  Hospital Course: 38 year old female admitted for TAH/BSO. Surgery uncomplicated. By POD # 1 she was ambulating, voiding, had good pain control and was tolerating regular diet.  BP (!) 92/39 (BP Location: Left Arm)   Pulse 68   Temp 98 F (36.7 C) (Oral)   Resp 18   Ht 5' 4 (1.626 m)   Wt 59 kg   SpO2 96%   BMI 22.31 kg/m  Results for orders placed or performed during the hospital encounter of 07/30/24 (from the past 24 hours)  CBC     Status: Abnormal   Collection Time: 07/31/24  4:40 AM  Result Value Ref Range   WBC 9.6 4.0 - 10.5 K/uL   RBC 3.68 (L) 3.87 - 5.11 MIL/uL   Hemoglobin 11.4 (L) 12.0 - 15.0 g/dL   HCT 66.5 (L) 63.9 - 53.9 %   MCV 90.8 80.0 - 100.0 fL   MCH 31.0 26.0 - 34.0 pg   MCHC 34.1 30.0 - 36.0 g/dL   RDW 87.7 88.4 - 84.4 %   Platelets 204 150 - 400 K/uL   nRBC 0.0 0.0 - 0.2 %   Abdomen is soft and non tender  Bandage clean and dry   Patient discharged home in good condition Follow up in one week Wound care reviewed Discharge instructions - no driving for one week Rx Oxycodone  sent in Pain medicine reviewed with patient

## 2024-09-07 ENCOUNTER — Encounter: Payer: Self-pay | Admitting: Hematology and Oncology

## 2024-09-12 ENCOUNTER — Other Ambulatory Visit: Payer: Self-pay | Admitting: Hematology and Oncology

## 2024-09-24 LAB — SIGNATERA
SIGNATERA MTM READOUT: 0 MTM/ml
SIGNATERA TEST RESULT: NEGATIVE

## 2024-12-08 ENCOUNTER — Inpatient Hospital Stay: Admitting: Hematology and Oncology

## 2025-06-10 ENCOUNTER — Ambulatory Visit: Admitting: Hematology and Oncology
